# Patient Record
Sex: Female | Born: 1965 | Race: Black or African American | Hispanic: No
Health system: Midwestern US, Community
[De-identification: ages and names within clinical notes are randomized; demographics above are authoritative.]

## PROBLEM LIST (undated history)

## (undated) DIAGNOSIS — N3941 Urge incontinence: Secondary | ICD-10-CM

## (undated) DIAGNOSIS — H8110 Benign paroxysmal vertigo, unspecified ear: Secondary | ICD-10-CM

## (undated) DIAGNOSIS — M5412 Radiculopathy, cervical region: Secondary | ICD-10-CM

## (undated) DIAGNOSIS — J45909 Unspecified asthma, uncomplicated: Secondary | ICD-10-CM

## (undated) DIAGNOSIS — F419 Anxiety disorder, unspecified: Secondary | ICD-10-CM

## (undated) DIAGNOSIS — G4733 Obstructive sleep apnea (adult) (pediatric): Secondary | ICD-10-CM

## (undated) DIAGNOSIS — Z720 Tobacco use: Secondary | ICD-10-CM

## (undated) DIAGNOSIS — I10 Essential (primary) hypertension: Secondary | ICD-10-CM

## (undated) DIAGNOSIS — F1910 Other psychoactive substance abuse, uncomplicated: Secondary | ICD-10-CM

## (undated) DIAGNOSIS — S76011A Strain of muscle, fascia and tendon of right hip, initial encounter: Secondary | ICD-10-CM

## (undated) DIAGNOSIS — T7840XA Allergy, unspecified, initial encounter: Secondary | ICD-10-CM

## (undated) DIAGNOSIS — R519 Headache, unspecified: Secondary | ICD-10-CM

## (undated) DIAGNOSIS — M797 Fibromyalgia: Secondary | ICD-10-CM

## (undated) DIAGNOSIS — Z6841 Body Mass Index (BMI) 40.0 and over, adult: Secondary | ICD-10-CM

## (undated) DIAGNOSIS — R931 Abnormal findings on diagnostic imaging of heart and coronary circulation: Secondary | ICD-10-CM

## (undated) DIAGNOSIS — E785 Hyperlipidemia, unspecified: Secondary | ICD-10-CM

## (undated) DIAGNOSIS — F329 Major depressive disorder, single episode, unspecified: Secondary | ICD-10-CM

## (undated) DIAGNOSIS — M545 Low back pain: Secondary | ICD-10-CM

## (undated) DIAGNOSIS — F5101 Primary insomnia: Secondary | ICD-10-CM

## (undated) DIAGNOSIS — E119 Type 2 diabetes mellitus without complications: Secondary | ICD-10-CM

## (undated) DIAGNOSIS — M1990 Unspecified osteoarthritis, unspecified site: Secondary | ICD-10-CM

## (undated) DIAGNOSIS — J309 Allergic rhinitis, unspecified: Secondary | ICD-10-CM

## (undated) DIAGNOSIS — K219 Gastro-esophageal reflux disease without esophagitis: Secondary | ICD-10-CM

## (undated) DIAGNOSIS — G4730 Sleep apnea, unspecified: Secondary | ICD-10-CM

## (undated) DIAGNOSIS — R110 Nausea: Secondary | ICD-10-CM

## (undated) HISTORY — DX: Other psychoactive substance abuse, uncomplicated: F19.10

## (undated) HISTORY — DX: Urge incontinence: N39.41

## (undated) HISTORY — DX: Tobacco use: Z72.0

## (undated) HISTORY — DX: Abnormal findings on diagnostic imaging of heart and coronary circulation: R93.1

## (undated) HISTORY — DX: Body Mass Index (BMI) 40.0 and over, adult: Z684

## (undated) HISTORY — PX: ECTOPIC PREGNANCY SURGERY: SHX613

## (undated) HISTORY — DX: Benign paroxysmal vertigo, unspecified ear: H81.10

## (undated) HISTORY — DX: Hyperlipidemia, unspecified: E78.5

## (undated) HISTORY — DX: Primary insomnia: F51.01

## (undated) HISTORY — DX: Radiculopathy, cervical region: M54.12

## (undated) HISTORY — DX: Major depressive disorder, single episode, unspecified: F32.9

## (undated) HISTORY — PX: LAPAROTOMY: SHX154

## (undated) HISTORY — DX: Morbid (severe) obesity due to excess calories: E66.01

## (undated) HISTORY — DX: Fibromyalgia: M79.7

## (undated) HISTORY — DX: Obstructive sleep apnea (adult) (pediatric): G47.33

## (undated) HISTORY — DX: Low back pain: M54.5

## (undated) HISTORY — DX: Allergy, unspecified, initial encounter: T78.40XA

## (undated) HISTORY — DX: Anxiety disorder, unspecified: F41.9

## (undated) HISTORY — DX: Strain of muscle, fascia and tendon of right hip, initial encounter: S76.011A

## (undated) HISTORY — DX: Allergic rhinitis, unspecified: J30.9

## (undated) HISTORY — DX: Unspecified osteoarthritis, unspecified site: M19.90

## (undated) HISTORY — DX: Type 2 diabetes mellitus without complications: E11.9

## (undated) HISTORY — DX: Sleep apnea, unspecified: G47.30

## (undated) HISTORY — PX: TUBAL LIGATION: SHX77

## (undated) HISTORY — PX: DILATION AND CURETTAGE OF UTERUS: SHX78

## (undated) HISTORY — DX: Headache, unspecified: R51.9

## (undated) HISTORY — DX: Gastro-esophageal reflux disease without esophagitis: K21.9

## (undated) HISTORY — DX: Nausea: R11.0

---

## 1999-09-08 ENCOUNTER — Other Ambulatory Visit: Admission: RE | Admit: 1999-09-08 | Discharge: 1999-09-08 | Payer: Self-pay | Admitting: Family Medicine

## 2000-08-25 ENCOUNTER — Encounter: Payer: Self-pay | Admitting: Emergency Medicine

## 2000-08-25 ENCOUNTER — Emergency Department (HOSPITAL_COMMUNITY): Admission: EM | Admit: 2000-08-25 | Discharge: 2000-08-25 | Payer: Self-pay | Admitting: Emergency Medicine

## 2000-12-29 ENCOUNTER — Emergency Department (HOSPITAL_COMMUNITY): Admission: EM | Admit: 2000-12-29 | Discharge: 2000-12-29 | Payer: Self-pay | Admitting: Emergency Medicine

## 2001-02-05 ENCOUNTER — Emergency Department (HOSPITAL_COMMUNITY): Admission: EM | Admit: 2001-02-05 | Discharge: 2001-02-05 | Payer: Self-pay | Admitting: Emergency Medicine

## 2001-03-05 ENCOUNTER — Emergency Department (HOSPITAL_COMMUNITY): Admission: EM | Admit: 2001-03-05 | Discharge: 2001-03-05 | Payer: Self-pay | Admitting: Emergency Medicine

## 2001-03-14 ENCOUNTER — Emergency Department (HOSPITAL_COMMUNITY): Admission: EM | Admit: 2001-03-14 | Discharge: 2001-03-14 | Payer: Self-pay | Admitting: Emergency Medicine

## 2001-06-27 ENCOUNTER — Encounter: Payer: Self-pay | Admitting: Family Medicine

## 2001-06-27 ENCOUNTER — Encounter: Admission: RE | Admit: 2001-06-27 | Discharge: 2001-06-27 | Payer: Self-pay | Admitting: Family Medicine

## 2005-05-31 ENCOUNTER — Emergency Department (HOSPITAL_COMMUNITY): Admission: EM | Admit: 2005-05-31 | Discharge: 2005-05-31 | Payer: Self-pay | Admitting: Family Medicine

## 2005-09-25 ENCOUNTER — Emergency Department (HOSPITAL_COMMUNITY): Admission: EM | Admit: 2005-09-25 | Discharge: 2005-09-25 | Payer: Self-pay | Admitting: Emergency Medicine

## 2005-09-27 ENCOUNTER — Emergency Department (HOSPITAL_COMMUNITY): Admission: EM | Admit: 2005-09-27 | Discharge: 2005-09-27 | Payer: Self-pay | Admitting: Emergency Medicine

## 2005-10-14 ENCOUNTER — Emergency Department (HOSPITAL_COMMUNITY): Admission: EM | Admit: 2005-10-14 | Discharge: 2005-10-14 | Payer: Self-pay | Admitting: Family Medicine

## 2006-01-05 ENCOUNTER — Ambulatory Visit: Payer: Self-pay | Admitting: Internal Medicine

## 2006-01-19 ENCOUNTER — Ambulatory Visit: Payer: Self-pay | Admitting: Internal Medicine

## 2006-03-01 ENCOUNTER — Ambulatory Visit: Payer: Self-pay | Admitting: Internal Medicine

## 2006-03-22 ENCOUNTER — Emergency Department (HOSPITAL_COMMUNITY): Admission: EM | Admit: 2006-03-22 | Discharge: 2006-03-22 | Payer: Self-pay | Admitting: Emergency Medicine

## 2006-06-01 ENCOUNTER — Ambulatory Visit: Payer: Self-pay | Admitting: Internal Medicine

## 2006-11-09 ENCOUNTER — Ambulatory Visit: Payer: Self-pay | Admitting: Internal Medicine

## 2006-11-12 ENCOUNTER — Encounter (INDEPENDENT_AMBULATORY_CARE_PROVIDER_SITE_OTHER): Payer: Self-pay | Admitting: *Deleted

## 2006-11-12 ENCOUNTER — Ambulatory Visit: Payer: Self-pay | Admitting: Internal Medicine

## 2006-11-12 LAB — CONVERTED CEMR LAB
ALT: 10 units/L (ref 0–35)
AST: 12 units/L (ref 0–37)
Alkaline Phosphatase: 91 units/L (ref 39–117)
BUN: 15 mg/dL (ref 6–23)
Basophils Absolute: 0 10*3/uL (ref 0.0–0.1)
Basophils Relative: 0 % (ref 0–1)
Cholesterol: 159 mg/dL (ref 0–200)
Creatinine, Ser: 0.97 mg/dL (ref 0.40–1.20)
Glucose, Bld: 119 mg/dL — ABNORMAL HIGH (ref 70–99)
Hemoglobin: 14.3 g/dL (ref 12.0–15.0)
Lymphocytes Relative: 32 % (ref 12–46)
Lymphs Abs: 2.5 10*3/uL (ref 0.7–3.3)
MCV: 92.8 fL (ref 78.0–100.0)
Monocytes Absolute: 0.5 10*3/uL (ref 0.2–0.7)
Neutro Abs: 4.7 10*3/uL (ref 1.7–7.7)
Potassium: 4.2 meq/L (ref 3.5–5.3)
RDW: 14.9 % — ABNORMAL HIGH (ref 11.5–14.0)
Sodium: 139 meq/L (ref 135–145)
Total CHOL/HDL Ratio: 3.8
Total Protein: 7.2 g/dL (ref 6.0–8.3)
WBC: 7.8 10*3/uL (ref 4.0–10.5)

## 2006-11-19 ENCOUNTER — Ambulatory Visit (HOSPITAL_COMMUNITY): Admission: RE | Admit: 2006-11-19 | Discharge: 2006-11-19 | Payer: Self-pay | Admitting: *Deleted

## 2006-11-23 DIAGNOSIS — R5383 Other fatigue: Secondary | ICD-10-CM

## 2006-11-23 DIAGNOSIS — N920 Excessive and frequent menstruation with regular cycle: Secondary | ICD-10-CM | POA: Insufficient documentation

## 2006-11-23 DIAGNOSIS — R5381 Other malaise: Secondary | ICD-10-CM | POA: Insufficient documentation

## 2006-11-23 DIAGNOSIS — I10 Essential (primary) hypertension: Secondary | ICD-10-CM | POA: Insufficient documentation

## 2006-12-19 ENCOUNTER — Ambulatory Visit (HOSPITAL_BASED_OUTPATIENT_CLINIC_OR_DEPARTMENT_OTHER): Admission: RE | Admit: 2006-12-19 | Discharge: 2006-12-19 | Payer: Self-pay | Admitting: *Deleted

## 2006-12-23 ENCOUNTER — Ambulatory Visit: Payer: Self-pay | Admitting: Internal Medicine

## 2007-01-01 DIAGNOSIS — G4733 Obstructive sleep apnea (adult) (pediatric): Secondary | ICD-10-CM | POA: Insufficient documentation

## 2007-01-28 ENCOUNTER — Telehealth (INDEPENDENT_AMBULATORY_CARE_PROVIDER_SITE_OTHER): Payer: Self-pay | Admitting: *Deleted

## 2007-03-05 ENCOUNTER — Ambulatory Visit: Payer: Self-pay | Admitting: Hospitalist

## 2007-03-05 ENCOUNTER — Encounter (INDEPENDENT_AMBULATORY_CARE_PROVIDER_SITE_OTHER): Payer: Self-pay | Admitting: *Deleted

## 2007-03-05 DIAGNOSIS — O99810 Abnormal glucose complicating pregnancy: Secondary | ICD-10-CM | POA: Insufficient documentation

## 2007-03-05 LAB — CONVERTED CEMR LAB
Bilirubin Urine: NEGATIVE
Hemoglobin, Urine: NEGATIVE
Ketones, ur: NEGATIVE mg/dL
Leukocytes, UA: NEGATIVE
Microalb Creat Ratio: 4.1 mg/g (ref 0.0–30.0)
Nitrite: NEGATIVE
Specific Gravity, Urine: 1.019 (ref 1.005–1.03)
TSH: 0.472 microintl units/mL (ref 0.350–5.50)
Urine Glucose: NEGATIVE mg/dL
pH: 6.5 (ref 5.0–8.0)

## 2007-03-20 ENCOUNTER — Ambulatory Visit: Payer: Self-pay | Admitting: Internal Medicine

## 2007-03-20 DIAGNOSIS — L909 Atrophic disorder of skin, unspecified: Secondary | ICD-10-CM | POA: Insufficient documentation

## 2007-03-20 DIAGNOSIS — D239 Other benign neoplasm of skin, unspecified: Secondary | ICD-10-CM | POA: Insufficient documentation

## 2007-03-20 DIAGNOSIS — L919 Hypertrophic disorder of the skin, unspecified: Secondary | ICD-10-CM

## 2007-04-01 ENCOUNTER — Telehealth: Payer: Self-pay | Admitting: *Deleted

## 2007-09-25 ENCOUNTER — Telehealth: Payer: Self-pay | Admitting: *Deleted

## 2007-09-25 ENCOUNTER — Emergency Department (HOSPITAL_COMMUNITY): Admission: EM | Admit: 2007-09-25 | Discharge: 2007-09-25 | Payer: Self-pay | Admitting: Family Medicine

## 2008-01-13 ENCOUNTER — Telehealth (INDEPENDENT_AMBULATORY_CARE_PROVIDER_SITE_OTHER): Payer: Self-pay | Admitting: *Deleted

## 2008-03-12 ENCOUNTER — Emergency Department (HOSPITAL_COMMUNITY): Admission: EM | Admit: 2008-03-12 | Discharge: 2008-03-12 | Payer: Self-pay | Admitting: Family Medicine

## 2008-05-20 ENCOUNTER — Emergency Department (HOSPITAL_COMMUNITY): Admission: EM | Admit: 2008-05-20 | Discharge: 2008-05-20 | Payer: Self-pay | Admitting: Emergency Medicine

## 2008-07-04 ENCOUNTER — Emergency Department (HOSPITAL_COMMUNITY): Admission: EM | Admit: 2008-07-04 | Discharge: 2008-07-04 | Payer: Self-pay | Admitting: Family Medicine

## 2008-07-06 ENCOUNTER — Telehealth: Payer: Self-pay | Admitting: *Deleted

## 2008-07-22 ENCOUNTER — Telehealth: Payer: Self-pay | Admitting: *Deleted

## 2008-07-23 ENCOUNTER — Telehealth (INDEPENDENT_AMBULATORY_CARE_PROVIDER_SITE_OTHER): Payer: Self-pay | Admitting: *Deleted

## 2008-07-28 ENCOUNTER — Ambulatory Visit: Payer: Self-pay | Admitting: Internal Medicine

## 2008-07-28 ENCOUNTER — Encounter (INDEPENDENT_AMBULATORY_CARE_PROVIDER_SITE_OTHER): Payer: Self-pay | Admitting: *Deleted

## 2008-07-28 LAB — CONVERTED CEMR LAB
CO2: 27 meq/L (ref 19–32)
Glucose, Bld: 135 mg/dL — ABNORMAL HIGH (ref 70–99)
Potassium: 4.3 meq/L (ref 3.5–5.3)
Sodium: 138 meq/L (ref 135–145)

## 2008-08-13 ENCOUNTER — Emergency Department (HOSPITAL_COMMUNITY): Admission: EM | Admit: 2008-08-13 | Discharge: 2008-08-14 | Payer: Self-pay | Admitting: Emergency Medicine

## 2008-08-13 DIAGNOSIS — J1189 Influenza due to unidentified influenza virus with other manifestations: Secondary | ICD-10-CM | POA: Insufficient documentation

## 2008-08-13 DIAGNOSIS — J209 Acute bronchitis, unspecified: Secondary | ICD-10-CM | POA: Insufficient documentation

## 2008-08-21 ENCOUNTER — Encounter (INDEPENDENT_AMBULATORY_CARE_PROVIDER_SITE_OTHER): Payer: Self-pay | Admitting: *Deleted

## 2008-08-21 ENCOUNTER — Ambulatory Visit: Payer: Self-pay | Admitting: Internal Medicine

## 2008-10-20 ENCOUNTER — Telehealth (INDEPENDENT_AMBULATORY_CARE_PROVIDER_SITE_OTHER): Payer: Self-pay | Admitting: *Deleted

## 2009-05-20 ENCOUNTER — Emergency Department (HOSPITAL_COMMUNITY): Admission: EM | Admit: 2009-05-20 | Discharge: 2009-05-20 | Payer: Self-pay | Admitting: Emergency Medicine

## 2009-06-04 ENCOUNTER — Telehealth: Payer: Self-pay | Admitting: *Deleted

## 2009-06-05 ENCOUNTER — Emergency Department (HOSPITAL_COMMUNITY): Admission: EM | Admit: 2009-06-05 | Discharge: 2009-06-05 | Payer: Self-pay | Admitting: Emergency Medicine

## 2009-06-07 ENCOUNTER — Encounter: Payer: Self-pay | Admitting: Internal Medicine

## 2009-06-07 ENCOUNTER — Ambulatory Visit: Payer: Self-pay | Admitting: Infectious Diseases

## 2009-06-07 DIAGNOSIS — K219 Gastro-esophageal reflux disease without esophagitis: Secondary | ICD-10-CM | POA: Insufficient documentation

## 2009-06-07 LAB — CONVERTED CEMR LAB
ALT: 13 U/L (ref 0–35)
AST: 12 U/L (ref 0–37)
Albumin: 4.2 g/dL (ref 3.5–5.2)
Alkaline Phosphatase: 98 U/L (ref 39–117)
BUN: 18 mg/dL (ref 6–23)
CO2: 24 meq/L (ref 19–32)
Calcium: 9.8 mg/dL (ref 8.4–10.5)
Chloride: 106 meq/L (ref 96–112)
Cholesterol: 167 mg/dL (ref 0–200)
Creatinine, Ser: 1.05 mg/dL (ref 0.40–1.20)
Glucose, Bld: 98 mg/dL (ref 70–99)
HDL: 58 mg/dL (ref >39)
LDL Cholesterol: 84 mg/dL (ref 0–99)
Potassium: 5 meq/L (ref 3.5–5.3)
Sodium: 145 meq/L (ref 135–145)
Total Bilirubin: 0.2 mg/dL — ABNORMAL LOW (ref 0.3–1.2)
Total CHOL/HDL Ratio: 2.9
Total Protein: 7 g/dL (ref 6.0–8.3)
Triglycerides: 126 mg/dL (ref <150)
VLDL: 25 mg/dL (ref 0–40)

## 2009-06-10 ENCOUNTER — Ambulatory Visit (HOSPITAL_COMMUNITY): Admission: RE | Admit: 2009-06-10 | Discharge: 2009-06-10 | Payer: Self-pay | Admitting: Internal Medicine

## 2009-06-24 ENCOUNTER — Telehealth: Payer: Self-pay | Admitting: Internal Medicine

## 2009-08-11 ENCOUNTER — Ambulatory Visit: Payer: Self-pay | Admitting: Advanced Practice Midwife

## 2009-08-11 ENCOUNTER — Inpatient Hospital Stay (HOSPITAL_COMMUNITY): Admission: AD | Admit: 2009-08-11 | Discharge: 2009-08-11 | Payer: Self-pay | Admitting: Obstetrics & Gynecology

## 2009-09-06 ENCOUNTER — Emergency Department (HOSPITAL_COMMUNITY): Admission: EM | Admit: 2009-09-06 | Discharge: 2009-09-06 | Payer: Self-pay | Admitting: Family Medicine

## 2010-02-03 ENCOUNTER — Ambulatory Visit: Payer: Self-pay | Admitting: Internal Medicine

## 2010-02-04 LAB — CONVERTED CEMR LAB
Albumin: 4.2 g/dL (ref 3.5–5.2)
Alkaline Phosphatase: 91 units/L (ref 39–117)
CO2: 28 meq/L (ref 19–32)
Sodium: 141 meq/L (ref 135–145)
Total Protein: 7.2 g/dL (ref 6.0–8.3)

## 2010-02-27 ENCOUNTER — Ambulatory Visit: Payer: Self-pay | Admitting: Infectious Diseases

## 2010-02-27 ENCOUNTER — Observation Stay (HOSPITAL_COMMUNITY): Admission: EM | Admit: 2010-02-27 | Discharge: 2010-03-02 | Payer: Self-pay | Admitting: Emergency Medicine

## 2010-02-27 ENCOUNTER — Emergency Department (HOSPITAL_COMMUNITY): Admission: EM | Admit: 2010-02-27 | Discharge: 2010-02-27 | Payer: Self-pay | Admitting: Family Medicine

## 2010-02-28 ENCOUNTER — Encounter (INDEPENDENT_AMBULATORY_CARE_PROVIDER_SITE_OTHER): Payer: Self-pay | Admitting: Internal Medicine

## 2010-03-01 ENCOUNTER — Encounter: Payer: Self-pay | Admitting: Internal Medicine

## 2010-03-07 ENCOUNTER — Telehealth: Payer: Self-pay | Admitting: *Deleted

## 2010-03-09 ENCOUNTER — Ambulatory Visit (HOSPITAL_COMMUNITY): Admission: RE | Admit: 2010-03-09 | Discharge: 2010-03-09 | Payer: Self-pay | Admitting: Internal Medicine

## 2010-03-09 ENCOUNTER — Ambulatory Visit: Payer: Self-pay | Admitting: Internal Medicine

## 2010-03-09 DIAGNOSIS — J45909 Unspecified asthma, uncomplicated: Secondary | ICD-10-CM | POA: Insufficient documentation

## 2010-03-09 DIAGNOSIS — M25519 Pain in unspecified shoulder: Secondary | ICD-10-CM | POA: Insufficient documentation

## 2010-03-10 ENCOUNTER — Encounter: Payer: Self-pay | Admitting: Internal Medicine

## 2010-03-14 ENCOUNTER — Ambulatory Visit (HOSPITAL_COMMUNITY): Admission: RE | Admit: 2010-03-14 | Discharge: 2010-03-14 | Payer: Self-pay | Admitting: Internal Medicine

## 2010-03-15 ENCOUNTER — Telehealth: Payer: Self-pay | Admitting: *Deleted

## 2010-03-21 ENCOUNTER — Ambulatory Visit: Payer: Self-pay | Admitting: Internal Medicine

## 2010-03-21 ENCOUNTER — Encounter: Payer: Self-pay | Admitting: Internal Medicine

## 2010-03-22 ENCOUNTER — Telehealth: Payer: Self-pay | Admitting: *Deleted

## 2010-03-31 ENCOUNTER — Telehealth: Payer: Self-pay | Admitting: Internal Medicine

## 2010-04-05 ENCOUNTER — Encounter: Payer: Self-pay | Admitting: Internal Medicine

## 2010-04-21 ENCOUNTER — Telehealth (INDEPENDENT_AMBULATORY_CARE_PROVIDER_SITE_OTHER): Payer: Self-pay | Admitting: *Deleted

## 2010-04-21 ENCOUNTER — Encounter: Payer: Self-pay | Admitting: Internal Medicine

## 2010-04-26 ENCOUNTER — Telehealth (INDEPENDENT_AMBULATORY_CARE_PROVIDER_SITE_OTHER): Payer: Self-pay | Admitting: Internal Medicine

## 2010-04-26 ENCOUNTER — Ambulatory Visit: Payer: Self-pay | Admitting: Internal Medicine

## 2010-04-26 DIAGNOSIS — R079 Chest pain, unspecified: Secondary | ICD-10-CM | POA: Insufficient documentation

## 2010-04-26 LAB — CONVERTED CEMR LAB
Chloride: 101 meq/L (ref 96–112)
Cholesterol: 181 mg/dL (ref 0–200)
Glucose, Bld: 98 mg/dL (ref 70–99)
HDL: 64 mg/dL (ref >39)
Hgb A1c MFr Bld: 6.3 %
Potassium: 4.4 meq/L (ref 3.5–5.3)
Sodium: 142 meq/L (ref 135–145)
Total CHOL/HDL Ratio: 2.8
Triglycerides: 76 mg/dL (ref <150)
VLDL: 15 mg/dL (ref 0–40)

## 2010-05-04 ENCOUNTER — Encounter: Payer: Self-pay | Admitting: Internal Medicine

## 2010-05-05 ENCOUNTER — Encounter: Payer: Self-pay | Admitting: Internal Medicine

## 2010-08-15 ENCOUNTER — Telehealth: Payer: Self-pay | Admitting: *Deleted

## 2010-09-11 ENCOUNTER — Emergency Department (HOSPITAL_COMMUNITY): Admission: EM | Admit: 2010-09-11 | Discharge: 2010-09-11 | Payer: Self-pay | Admitting: Family Medicine

## 2010-09-21 ENCOUNTER — Ambulatory Visit: Payer: Self-pay | Admitting: Internal Medicine

## 2010-09-21 DIAGNOSIS — F4321 Adjustment disorder with depressed mood: Secondary | ICD-10-CM | POA: Insufficient documentation

## 2010-11-24 ENCOUNTER — Ambulatory Visit: Admit: 2010-11-24 | Payer: Self-pay

## 2010-12-22 NOTE — Letter (Signed)
Summary: [facility] [facility]  [facility]   Imported By: Margie Billet 04/27/2010 11:08:43  _____________________________________________________________________  External Attachment:    Type:   Image     Comment:   External Document

## 2010-12-22 NOTE — Progress Notes (Signed)
Summary: phone/gg  Phone Note Call from Patient   Caller: Patient Summary of Call: Pt called with c/o pain from right side of chest  through to back.  Onset yesterday, constant throb.   Pain is better now put gets sharp at times, esp at night.   Denies SOB , diaphoresis, nausea.   No known cause of pain.  took aleve without relief.  also took meds for gas that has not helped. appointment given for 3:30 today Pt #  [phone] Initial call taken by: Merrie Roof RN,  April 21, 2010 10:30 AM  Follow-up for Phone Call        Difficult to evaluate severity and nature of CP.  No CAD nor PE on problem list.  Would recommend ER sice having active CP.  If pt refuses, keep todays OPC appt.  Follow-up by: Blanch Media MD,  April 21, 2010 10:43 AM  Additional Follow-up for Phone Call Additional follow up Details #1::        Pt informed and will go to ED Additional Follow-up by: Merrie Roof RN,  April 21, 2010 10:56 AM

## 2010-12-22 NOTE — Consult Note (Signed)
Summary: [facility]  [facility]   Imported By: Margie Billet 05/19/2010 10:02:43  _____________________________________________________________________  External Attachment:    Type:   Image     Comment:   External Document

## 2010-12-22 NOTE — Miscellaneous (Signed)
Summary: [facility]  [facility]   Imported By: Gentry Fitz 05/17/2010 14:12:08  _____________________________________________________________________  External Attachment:    Type:   Image     Comment:   External Document  Appended Document: [facility]    Clinical Lists Changes  Observations: Added new observation of PAST MED HX: Headache, daily Hypertension Menorrhagia, normal ultrasound 11-19-06 Fatigue Obstructive sleep apnea      On CPAP (going back to get O2 titrated) Hx mass left breast, repeat mammogram and ultrasound negative Hx right rotator cuff tendinitis july 2007 Prediabetes (Fasting glucose 119 03/05/07)  No pap smear on record  (05/31/2010 10:15)       Impression & Recommendations:  Problem # 1:  Preventive Health Care (ICD-V70.0) No pap smear per GYN records  Complete Medication List: 1)  Lisinopril 30 Mg Tabs (Lisinopril) .... Take 1 tablet by mouth once a day 2)  Hydrochlorothiazide 25 Mg Tabs (Hydrochlorothiazide) .... Take 1 tablet by mouth once a day 3)  Ventolin Hfa 108 (90 Base) Mcg/act Aers (Albuterol sulfate) .... Inhale 2 puffs every 4-6 hours as needed for wheezing. 4)  Flovent Diskus 50 Mcg/blist Aepb (Fluticasone propionate (inhal)) .Marland Kitchen.. 1 puff two times a day 5)  Lorazepam 0.5 Mg Tabs (Lorazepam) .Marland Kitchen.. 1 tab as needed for anxiety.   Past History:  Past Medical History: Headache, daily Hypertension Menorrhagia, normal ultrasound 11-19-06 Fatigue Obstructive sleep apnea      On CPAP (going back to get O2 titrated) Hx mass left breast, repeat mammogram and ultrasound negative Hx right rotator cuff tendinitis july 2007 Prediabetes (Fasting glucose 119 03/05/07)  No pap smear on record

## 2010-12-22 NOTE — Letter (Signed)
Summary: Generic Letter  [facility]  [address]   [place], [place] [address]   Phone: [phone]  Fax: [phone]    03/21/2010  CAILYNN BODNAR [address] [place], [place]  [address]  Reg: Ms. NIGG,  Ms. Gamm was here in our [facility] clinic today. If you have further questions, please feel free to call us.       Sincerely,   Blondell Reveal MD

## 2010-12-22 NOTE — Initial Assessments (Signed)
Summary: H&P  INTERNAL MEDICINE TEACHING SERVICE ADMISSION HISTORY & PHYSICAL   Attending: Dr. Tilford Pillar First contact: Dr. Scot Dock [phone] Second contact: Dr. Comer Locket [phone] (Weekends, after-hours: [phone], [phone])     PCP: Dr. Sunnie Nielsen     CC: SOB   HPI: 45 yo woman with HTN, OSA, and occasional asthma not on regular inhalers, presented to the ER with one day history of sudden onset breathing difficulty. Started in the afternoon time one day ago, she started to feel short of breath and had dry cough. This remained persistent and worsened throughout the night and she had to come in for treatment. She denies chest pain, apart from the pains she gets with coughing. She denies any leg swelling, immobility, hemoptysis or h/o dvt. She denies orthopnea, PND, fever, chills, sick contact, travel.   She doesn't recall anything that could have triggered it. She continues to smoke 1-2 cigarette daily. Has no pets at home, but recently got a couch from her neighbour who owns a dog. The floor in the house is carpeted and is "clean".   She initially went to the urgent care center, where she got breathing treatment, but her sats remained low in the 80s and hence was sent to the ER.   Allergies:  NKDA  Past Medical History:  Migraine Hypertension Menorrhagia, normal ultrasound 11-19-06 Fatigue Obstructive sleep apnea      On CPAP (going back to get O2 titrated) Hx mass left breast, repeat mammogram and ultrasound negative Hx right rotator cuff tendinitis july 2007 Prediabetes (Fasting glucose 119 03/05/07)  Past Surgical History:  None  Current Meds:  LISINOPRIL 30 MG TABS (LISINOPRIL) Take 1 tablet by mouth once a day HYDROCHLOROTHIAZIDE 25 MG TABS (HYDROCHLOROTHIAZIDE) Take 1 tablet by mouth once a day VENTOLIN HFA 108 (90 BASE) MCG/ACT AERS (ALBUTEROL SULFATE) inhale 2 puffs every 4-6 hours as needed for wheezing.    Social History:   Has a 10 yr old son, a Research scientist (medical) and a granddgtr  that live with her. Sings in church, works at [employer].   Smokes one cigarette a day  Family History:  Non-contributory  Review of System: Negative except per HPI.    Vital Signs: BP-169/118    HR-122   RR-27   Temp-99.3   Sat-98%/3L  Physical Exam: GEN- Not in distress, able to speak in sentences HEENT- NCAT, PERRL, no icterus/pallor LUNGS- bilat good air entry, prolonged expiration, + diffuse wheezes   CVS- regular tachy, no MRG ABD- soft, non-tender, normal bowel sounds.   EXT- no cyanosis, clubbing or edema.  NEURO- no focal deficit. PSY- appropriate.    LAB RESULTS       15.6 >-----------<              46  138        101       7 ------------------------------<165     3.8        28         0.9   Calcium 1.03, ionized.         CXR: Low lung volume w/ vascular crowding, streaky bibasilar atx.    ASSESSMENT & PLAN  This is 45 yo woman with h/o intermittent asthma presenting with asthma exac. She will be admitted for breathing treatment and IV steroids. She will need to be started on regular inhaled corticosteroid treatment.   - admit to regular floor - duoneb q 4h - solumedrol iv - continue bp  meds - smoking cessation consult  prophylaxis: lovenox  ATTENDING PHYSICIAN: I performed and/or observed a history and physical examination of the patient.  I discussed the case with the residents as noted and reviewed the residents' notes.  I agree with the findings and plan--please refer to the attending physician note for more details.  Signature________________________________  Printed Name_____________________________

## 2010-12-22 NOTE — Assessment & Plan Note (Signed)
Summary: ACUTE-URGENT CARE 1 WEEK MEDICATION F/U/CFB(MAGICK)   Vital Signs:  Patient profile:   45 year old female Height:      62.5 inches (158.75 cm) Weight:      221.0 pounds (100.45 kg) BMI:     39.92 Temp:     97.6 degrees F (36.44 degrees C) oral Pulse rate:   92 / minute BP sitting:   146 / 106  (left arm) Cuff size:   regular  Vitals Entered By: Theotis Barrio NT II (September 21, 2010 8:43 AM) CC: BP IS UP / PATIENT DID NOT TAKE BP MED THIS MORNING / ? MEDICATION REFILL/ STOPPED TAKING THE CYMBALTA Is Patient Diabetic? No Pain Assessment Patient in pain? yes     Location: ARMS.- LEGS- FEET Intensity:     3 Type: dull Onset of pain  FOR ABOUT 2 MONTHS / ORIG. SEEN AT URGENT CARE Nutritional Status BMI of > 30 = obese  Have you ever been in a relationship where you felt threatened, hurt or afraid?No   Does patient need assistance? Functional Status Self care Ambulation Normal   Primary Care Provider:  Hartley Barefoot MD  CC:  BP IS UP / PATIENT DID NOT TAKE BP MED THIS MORNING / ? MEDICATION REFILL/ STOPPED TAKING THE CYMBALTA.  History of Present Illness: This is a 45 year old female with a past medical history significant for hypertension, asthma and OSA who presents for an acute visit for diffuse body pains over the last several months.  Pt has tried taking xanax and 800mg  IBU with no improvement.  Previously job was very stressful and she stopped working.  Of note the patient started a new job last week.  Stress level is now greatly improved.  Diffuse body pains have continued. Experiences sharp pain in fingers, shoulders with stiffness in legs and pain with walking. Pain is not worse at any particular time of day.  Pt is not sleeping well, and waking up 3 times a night.  Pt went to urgent care and Dr. there gave cymbalta last week which has not helped and told it was adjustment disorder.  Cymbalta is making sleep more difficult but is helping with some with pain.  Pt  had depressive state a few months ago. Financial difficulties, son misbehaving ect.  Situation improving now.  Cymbalta is also too expensive (180$ monthly).  Asthma is currently fine, not requiring inhalers and has had no cough.  Pt's BP was elevated today but she had not taken her bp in last two days.  Recommended pill box.  Pt denies any cp, sob, change in bm's or blood in stool.      Preventive Screening-Counseling & Management  Alcohol-Tobacco     Alcohol drinks/day: social     Smoking Status: quit     Smoking Cessation Counseling: yes     Packs/Day: 1-2 cigs a day     Year Started: stopped last Thursday     Passive Smoke Exposure: yes  Caffeine-Diet-Exercise     Diet Comments: NSTRF4     Does Patient Exercise: no  Problems Prior to Update: 1)  Chest Pain  (ICD-786.50) 2)  Glucose Tlrnc Abnrm, Mthr Cmplg Prg, Unspc  (ICD-648.80) 3)  Hypertension  (ICD-401.9) 4)  Asthma  (ICD-493.90) 5)  Esophageal Reflux  (ICD-530.81) 6)  Sleep Apnea, Obstructive, Moderate  (ICD-327.23) 7)  Headache  (ICD-784.0) 8)  Shoulder Pain, Bilateral  (ICD-719.41) 9)  Screening For Lipoid Disorders  (ICD-V77.91) 10)  Influenza  (  ICD-487.8) 11)  Acute Bronchitis  (ICD-466.0) 12)  Nevus  (ICD-216.9) 13)  Skin Tag  (ICD-701.9) 14)  Fatigue, Chronic  (ICD-780.79) 15)  Menorrhagia  (ICD-626.2)  Allergies: No Known Drug Allergies  Family History: Reviewed history and no changes required. Mother and sisters have insomnia.   Social History: Reviewed history from 08/21/2008 and no changes required. Has a 88 yr old son, a dgtr and a granddgtr that live with her. Sings in church. Pt started new job 09/14/10.  Review of Systems       Negative as per HPI.   Physical Exam  General:  alert, well-developed, and well-hydrated.   Head:  normocephalic and atraumatic.   Eyes:  vision grossly intact, pupils equal, pupils round, and pupils reactive to light.   Ears:  R ear normal and L ear normal.     Nose:  no external deformity and no nasal discharge.   Neck:  supple.   Lungs:  normal respiratory effort, normal breath sounds, no crackles.  There were diffuse  expiratory wheezes.  Heart:  normal rate, regular rhythm, no murmur, no gallop, and no rub.   Abdomen:  soft, non-tender, and normal bowel sounds.   Msk:  normal ROM, no joint tenderness, no joint swelling, no joint warmth, and no redness over joints.   Pulses:  2+ PT/DP pulses bilaterally.  Neurologic:  cranial nerves II-XII intact.   Skin:  No rashes   Impression & Recommendations:  Problem # 1:  DEPRESSION, SITUATIONAL (ICD-309.0) Pt depression is improving and was discribed as situational.  Unfortunately, cymbalta is too expensive for the patient to continue even though it has helped her diffuse pains to some degree.  Given the patients diffuse pain symptoms and difficulty sleeping, both of which are likely secondary to the patient's depression, I will start her on low dose amitriptyline today.   The patient was instructed to take the medication at night as it should help her to sleep.  Problem # 2:  ASTHMA (ICD-493.90) Pt denies any SOB, cough or wheezing and felt that since she was asymptomatic that she could stop all of her inhalers.  On exam there were diffuse wheezes, and patient was told that she needed to restart her flovent which she agreed to do.    Restart flovent Her updated medication list for this problem includes:    Ventolin Hfa 108 (90 Base) Mcg/act Aers (Albuterol sulfate) ..... Inhale 2 puffs every 4-6 hours as needed for wheezing.    Flovent Diskus 50 Mcg/blist Aepb (Fluticasone propionate (inhal)) .Marland Kitchen... 1 puff two times a day  Problem # 3:  HYPERTENSION (ICD-401.9) Pt's BP was slightly elevated today which she attributes to not taking her BP medicains in the the last several days.  The patient states that she did not take them because she did not have them when she needed them.  I recommended that she put  all her pills for the week in a pill box that she could carry in her purse to improve adherence. Her updated medication list for this problem includes:    Lisinopril 30 Mg Tabs (Lisinopril) .Marland Kitchen... Take 1 tablet by mouth once a day    Hydrochlorothiazide 25 Mg Tabs (Hydrochlorothiazide) .Marland Kitchen... Take 1 tablet by mouth once a day  Complete Medication List: 1)  Lisinopril 30 Mg Tabs (Lisinopril) .... Take 1 tablet by mouth once a day 2)  Hydrochlorothiazide 25 Mg Tabs (Hydrochlorothiazide) .... Take 1 tablet by mouth once a day 3)  Ventolin Hfa  108 (90 Base) Mcg/act Aers (Albuterol sulfate) .... Inhale 2 puffs every 4-6 hours as needed for wheezing. 4)  Flovent Diskus 50 Mcg/blist Aepb (Fluticasone propionate (inhal)) .Marland Kitchen.. 1 puff two times a day 5)  Lorazepam 0.5 Mg Tabs (Lorazepam) .Marland Kitchen.. 1 tab as needed for anxiety. 6)  Amitriptyline Hcl 25 Mg Tabs (Amitriptyline hcl) .... Take 1 tab by mouth at at around 6-7 pm.   Patient Instructions: 1)  You will start your amitriptiyliine for your depression and difficulty sleeping.  Please take around 6-7 pm and avoid driving heavy machinery until you find out how the medication will effect you.  If you note worsening depression please call the clinic or go to the ER.  Please get a pill box to store your medications and take them as perscribed.  Please start taking your flovent daily.  Prescriptions: AMITRIPTYLINE HCL 25 MG TABS (AMITRIPTYLINE HCL) Take 1 tab by mouth at at around 6-7 pm.  #30 x 3   Entered and Authorized by:   Sinda Du MD   Signed by:   Sinda Du MD on 09/21/2010   Method used:   Print then Give to Patient   RxID:   [id number]    Orders Added: 1)  Est. Patient Level III [88416]     Prevention & Chronic Care Immunizations   Influenza vaccine: Not documented   Influenza vaccine deferral: Deferred  (06/07/2009)   Influenza vaccine due: 07/21/2009    Tetanus booster: Not documented   Td booster deferral: Deferred   (06/07/2009)    Pneumococcal vaccine: Not documented   Pneumococcal vaccine deferral: Deferred  (06/07/2009)  Other Screening   Pap smear: Not documented    Mammogram: ASSESSMENT: Negative - BI-RADS 1^MM DIGITAL SCREENING  (06/10/2009)   Mammogram action/deferral: Screening mammogram in 1 year.     (06/10/2009)   Mammogram due: 06/2010   Smoking status: quit  (09/21/2010)  Lipids   Total Cholesterol: 181  (04/26/2010)   Lipid panel action/deferral: Lipid Panel ordered   LDL: 102  (04/26/2010)   LDL Direct: Not documented   HDL: 64  (04/26/2010)   Triglycerides: 76  (04/26/2010)  Hypertension   Last Blood Pressure: 146 / 106  (09/21/2010)   Serum creatinine: 0.87  (04/26/2010)   BMP action: Ordered   Serum potassium 4.4  (04/26/2010)  Self-Management Support :   Personal Goals (by the next clinic visit) :      Personal blood pressure goal: 140/90  (03/09/2010)   Patient will work on the following items until the next clinic visit to reach self-care goals:     Medications and monitoring: take my medicines every day, bring all of my medications to every visit  (09/21/2010)     Eating: drink diet soda or water instead of juice or soda, eat more vegetables, use fresh or frozen vegetables, eat foods that are low in salt, eat baked foods instead of fried foods, eat fruit for snacks and desserts, limit or avoid alcohol  (09/21/2010)     Activity: take a 30 minute walk every day  (09/21/2010)    Hypertension self-management support: Resources for patients handout  (09/21/2010)      Resource handout printed.   Appended Document: ACUTE-URGENT CARE 1 WEEK MEDICATION F/U/CFB(MAGICK) I discussed the patient with Dr. Cathey Endow and I agree with the assessment and plan as outlined above.

## 2010-12-22 NOTE — Progress Notes (Signed)
Summary: lab results/ hla  Phone Note Call from Patient   Summary of Call: pt called at [phone], wanted lab results, i don't see that labs were done, please advise? Initial call taken by: Marin Roberts RN,  April 26, 2010 5:36 PM  Follow-up for Phone Call        I tried to call the patient, she did not pick the phone. We checked for diabetes. Her fasting blood sugar is normal. She doesn't have diabetes. If she calls back you can let her know. I will be happy to talk to her if she wants.  Follow-up by: Zara Council MD,  April 27, 2010 8:03 AM  Additional Follow-up for Phone Call Additional follow up Details #1::        Patient would like to hear from you about labs,phone number is [phone] Additional Follow-up by: [name],  April 27, 2010 3:23 PM    Additional Follow-up for Phone Call Additional follow up Details #2::    called patient, gave info.  Follow-up by: Zara Council MD,  April 28, 2010 10:14 AM

## 2010-12-22 NOTE — Assessment & Plan Note (Signed)
Summary: HFU-PER DR DEVANI(REGALADO)/CFB   Vital Signs:  Patient profile:   45 year old female Height:      62.5 inches (158.75 cm) Weight:      213.7 pounds (97.05 kg) BMI:     38.57 Temp:     98.7 degrees F (37.06 degrees C) oral Pulse rate:   95 / minute BP sitting:   125 / 87  (left arm) Cuff size:   large  Vitals Entered By: Theotis Barrio NT II (March 09, 2010 1:57 PM) CC: HFU - IS HAVING PAIN IN NECK, UPPER BACK/BILATERAL SOU Is Patient Diabetic? No Research Study Name: THIS STARTED ABOUT LAST TUESDAY Pain Assessment Patient in pain? yes     Location: NECK-ARMS Intensity:      3 Type: ACHY /  Onset of pain  TUJ Nutritional Status BMI of > 30 = obese  Have you ever been in a relationship where you felt threatened, hurt or afraid?No   Does patient need assistance? Functional Status Self care Ambulation Normal   Primary Care Provider:  Hartley Barefoot MD  CC:  HFU - IS HAVING PAIN IN NECK and UPPER BACK/BILATERAL SOU.  History of Present Illness: 45 year old with Past Medical History as mentioned below comes to the office for a : HFU .  Patient was admitted to the hospital  for asthma exacerbation. Reports compliancy to her inhalers (both albuterol and steroid inhaler) and reports that her breathing improved a lot.   She is complaining of both shoulder pains for the last several days and is taking naproxen which is helping her a little bit. She denies any trauma, fever or other systemic complaints.   She denies any other complaints.   Preventive Screening-Counseling & Management  Alcohol-Tobacco     Smoking Status: quit     Smoking Cessation Counseling: yes     Packs/Day: 1-2 cigs a day     Year Started: stopped last Thursday     Passive Smoke Exposure: yes  Caffeine-Diet-Exercise     Diet Comments: NSTRF4  Medications Prior to Update: 1)  Lisinopril 30 Mg Tabs (Lisinopril) .... Take 1 Tablet By Mouth Once A Day 2)  Hydrochlorothiazide 25 Mg Tabs  (Hydrochlorothiazide) .... Take 1 Tablet By Mouth Once A Day 3)  Ventolin Hfa 108 (90 Base) Mcg/act Aers (Albuterol Sulfate) .... Inhale 2 Puffs Every 4-6 Hours As Needed For Wheezing. 4)  Flovent Diskus 50 Mcg/blist Aepb (Fluticasone Propionate (Inhal)) .Marland Kitchen.. 1 Puff Two Times A Day 5)  Prednisone 20 Mg Tabs (Prednisone) .... 2 Tab For 3 Days, Then 1 Tab For 3 Days, Then Stop 6)  Lorazepam 0.5 Mg Tabs (Lorazepam) .Marland Kitchen.. 1 Tab As Needed For Anxiety.  Current Medications (verified): 1)  Lisinopril 30 Mg Tabs (Lisinopril) .... Take 1 Tablet By Mouth Once A Day 2)  Hydrochlorothiazide 25 Mg Tabs (Hydrochlorothiazide) .... Take 1 Tablet By Mouth Once A Day 3)  Ventolin Hfa 108 (90 Base) Mcg/act Aers (Albuterol Sulfate) .... Inhale 2 Puffs Every 4-6 Hours As Needed For Wheezing. 4)  Flovent Diskus 50 Mcg/blist Aepb (Fluticasone Propionate (Inhal)) .Marland Kitchen.. 1 Puff Two Times A Day 5)  Lorazepam 0.5 Mg Tabs (Lorazepam) .Marland Kitchen.. 1 Tab As Needed For Anxiety.  Allergies (verified): No Known Drug Allergies  Past History:  Past Medical History: Last updated: 03/20/2007 Headache, daily Hypertension Menorrhagia, normal ultrasound 11-19-06 Fatigue Obstructive sleep apnea      On CPAP (going back to get O2 titrated) Hx mass left breast, repeat mammogram and  ultrasound negative Hx right rotator cuff tendinitis july 2007 Prediabetes (Fasting glucose 119 03/05/07)  Social History: Last updated: 08/21/2008 Has a 73 yr old son, a dgtr and a granddgtr that live with her. Sings in church. Got laid off from work and is looking for a new job.  Risk Factors: Diet: NSTRF4 (03/09/2010) Exercise: no (02/03/2010)  Risk Factors: Smoking Status: quit (03/09/2010) Packs/Day: 1-2 cigs a day (03/09/2010) Passive Smoke Exposure: yes (03/09/2010)  Social History: Reviewed history from 08/21/2008 and no changes required. Has a 50 yr old son, a dgtr and a granddgtr that live with her. Sings in church. Got laid off from  work and is looking for a new job.  Review of Systems      See HPI  Physical Exam  General:  alert, well-developed, and well-nourished.   Head:  normocephalic, atraumatic, and no abnormalities observed.   Lungs:  normal respiratory effort, no intercostal retractions, no accessory muscle use, and normal breath sounds.   Heart:  normal rate and regular rhythm.   Abdomen:  soft, non-tender, normal bowel sounds, no distention, and no masses.   Msk:  No deformity or scoliosis noted of thoracic or lumbar spine.   Pulses:  R radial normal.   Extremities:  trace left pedal edema and trace right pedal edema.   Neurologic:  alert & oriented X3, cranial nerves II-XII intact, strength normal in all extremities, and sensation intact to light touch.     Impression & Recommendations:  Problem # 1:  ASTHMA (ICD-493.90)  Well controlled currently. Recently admitted to [facility] for exac. Given her rare asthma spells, recommended to take use as needed albuterol. Discussed with patient regarding starting inhaled steroids option if asthma spells get more frequent. I would keep thi sinhaler in the list for now. Will check PFT's and follow up.  The following medications were removed from the medication list:    Prednisone 20 Mg Tabs (Prednisone) .Marland Kitchen... 2 tab for 3 days, then 1 tab for 3 days, then stop Her updated medication list for this problem includes:    Ventolin Hfa 108 (90 Base) Mcg/act Aers (Albuterol sulfate) ..... Inhale 2 puffs every 4-6 hours as needed for wheezing.    Flovent Diskus 50 Mcg/blist Aepb (Fluticasone propionate (inhal)) .Marland Kitchen... 1 puff two times a day  Orders: Full Pulmonary Function Test (PFT)  Problem # 2:  SHOULDER PAIN, BILATERAL (ICD-719.41)  Unclear etiology. Will check xrays of both shoulders and will follow up. Patient is currently taking naproxen and appears to be helping her. Will follow up.  Orders: T-DG Shoulder 2V Bilat ([id number])  Problem # 3:  HYPERTENSION  (ICD-401.9) Well controlled. Cont current regimen.  Her updated medication list for this problem includes:    Lisinopril 30 Mg Tabs (Lisinopril) .Marland Kitchen... Take 1 tablet by mouth once a day    Hydrochlorothiazide 25 Mg Tabs (Hydrochlorothiazide) .Marland Kitchen... Take 1 tablet by mouth once a day  BP today: 125/87 Prior BP: 136/87 (02/03/2010)  Labs Reviewed: K+: 4.2 (02/03/2010) Creat: : 0.87 (02/03/2010)   Chol: 167 (06/07/2009)   HDL: 58 (06/07/2009)   LDL: 84 (06/07/2009)   TG: 126 (06/07/2009)  Complete Medication List: 1)  Lisinopril 30 Mg Tabs (Lisinopril) .... Take 1 tablet by mouth once a day 2)  Hydrochlorothiazide 25 Mg Tabs (Hydrochlorothiazide) .... Take 1 tablet by mouth once a day 3)  Ventolin Hfa 108 (90 Base) Mcg/act Aers (Albuterol sulfate) .... Inhale 2 puffs every 4-6 hours as needed for wheezing. 4)  Flovent  Diskus 50 Mcg/blist Aepb (Fluticasone propionate (inhal)) .Marland Kitchen.. 1 puff two times a day 5)  Lorazepam 0.5 Mg Tabs (Lorazepam) .Marland Kitchen.. 1 tab as needed for anxiety.  Patient Instructions: 1)  Please schedule a follow-up appointment in 6 months. 2)  If your breathing gets worse, please call us or seek medical help.  Prevention & Chronic Care Immunizations   Influenza vaccine: Not documented   Influenza vaccine deferral: Deferred  (06/07/2009)   Influenza vaccine due: 07/21/2009    Tetanus booster: Not documented   Td booster deferral: Deferred  (06/07/2009)    Pneumococcal vaccine: Not documented   Pneumococcal vaccine deferral: Deferred  (06/07/2009)  Other Screening   Pap smear: Not documented    Mammogram: ASSESSMENT: Negative - BI-RADS 1^MM DIGITAL SCREENING  (06/10/2009)   Mammogram action/deferral: Screening mammogram in 1 year.     (06/10/2009)   Mammogram due: 06/2010   Smoking status: quit  (03/09/2010)  Lipids   Total Cholesterol: 167  (06/07/2009)   Lipid panel action/deferral: Lipid Panel ordered   LDL: 84  (06/07/2009)   LDL Direct: Not documented    HDL: 58  (06/07/2009)   Triglycerides: 126  (06/07/2009)  Hypertension   Last Blood Pressure: 125 / 87  (03/09/2010)   Serum creatinine: 0.87  (02/03/2010)   BMP action: Ordered   Serum potassium 4.2  (02/03/2010)  Self-Management Support :   Personal Goals (by the next clinic visit) :      Personal blood pressure goal: 140/90  (03/09/2010)   Patient will work on the following items until the next clinic visit to reach self-care goals:     Medications and monitoring: take my medicines every day, check my blood sugar  (03/09/2010)    Hypertension self-management support: Resources for patients handout  (03/09/2010)      Resource handout printed.

## 2010-12-22 NOTE — Assessment & Plan Note (Signed)
Summary: severe pain between shoulders, spinal area/pcp-regalado/hla   Vital Signs:  Patient profile:   45 year old female Height:      62.5 inches (158.75 cm) Weight:      212.0 pounds (97.14 kg) BMI:     38.60 Temp:     98.0 degrees F (36.67 degrees C) oral Pulse rate:   84 / minute BP sitting:   132 / 96  (right arm) Cuff size:   large  Vitals Entered By: Theotis Barrio NT II (Mar 21, 2010 10:20 AM) Is Patient Diabetic? No Research Study Name: WHILE PATIENT WAS IN THE HOSPITAL IN APRIL 011 Pain Assessment Patient in pain? yes     Location: shoulder Intensity:       5 Type: sharp Onset of pain  APRIL 011 Nutritional Status BMI of > 30 = obese  Have you ever been in a relationship where you felt threatened, hurt or afraid?No   Does patient need assistance? Functional Status Self care Ambulation Normal   Primary Care Provider:  Hartley Barefoot MD   History of Present Illness: 45 year old lady with PMH as mentioned in the EMR comes to the office for filling up her FMLA forms.  Patient has been reporting both shoulder pains for the last 3 weeks and reports that the pains have been episodic and during flare ups, she couldnt able to work full time. She is aslo requesting to see an orthopedic doctor.  She denies any other complaints.  Problems Prior to Update: 1)  Shoulder Pain, Bilateral  (ICD-719.41) 2)  Asthma  (ICD-493.90) 3)  Screening For Lipoid Disorders  (ICD-V77.91) 4)  Esophageal Reflux  (ICD-530.81) 5)  Influenza  (ICD-487.8) 6)  Acute Bronchitis  (ICD-466.0) 7)  Nevus  (ICD-216.9) 8)  Skin Tag  (ICD-701.9) 9)  Glucose Tlrnc Abnrm, Mthr Cmplg Prg, Unspc  (ICD-648.80) 10)  Sleep Apnea, Obstructive, Moderate  (ICD-327.23) 11)  Fatigue, Chronic  (ICD-780.79) 12)  Menorrhagia  (ICD-626.2) 13)  Hypertension  (ICD-401.9) 14)  Headache  (ICD-784.0)  Medications Prior to Update: 1)  Lisinopril 30 Mg Tabs (Lisinopril) .... Take 1 Tablet By Mouth Once A  Day 2)  Hydrochlorothiazide 25 Mg Tabs (Hydrochlorothiazide) .... Take 1 Tablet By Mouth Once A Day 3)  Ventolin Hfa 108 (90 Base) Mcg/act Aers (Albuterol Sulfate) .... Inhale 2 Puffs Every 4-6 Hours As Needed For Wheezing. 4)  Flovent Diskus 50 Mcg/blist Aepb (Fluticasone Propionate (Inhal)) .Marland Kitchen.. 1 Puff Two Times A Day 5)  Lorazepam 0.5 Mg Tabs (Lorazepam) .Marland Kitchen.. 1 Tab As Needed For Anxiety.  Current Medications (verified): 1)  Lisinopril 30 Mg Tabs (Lisinopril) .... Take 1 Tablet By Mouth Once A Day 2)  Hydrochlorothiazide 25 Mg Tabs (Hydrochlorothiazide) .... Take 1 Tablet By Mouth Once A Day 3)  Ventolin Hfa 108 (90 Base) Mcg/act Aers (Albuterol Sulfate) .... Inhale 2 Puffs Every 4-6 Hours As Needed For Wheezing. 4)  Flovent Diskus 50 Mcg/blist Aepb (Fluticasone Propionate (Inhal)) .Marland Kitchen.. 1 Puff Two Times A Day 5)  Lorazepam 0.5 Mg Tabs (Lorazepam) .Marland Kitchen.. 1 Tab As Needed For Anxiety.  Allergies (verified): No Known Drug Allergies  Past History:  Past Medical History: Last updated: 03/20/2007 Headache, daily Hypertension Menorrhagia, normal ultrasound 11-19-06 Fatigue Obstructive sleep apnea      On CPAP (going back to get O2 titrated) Hx mass left breast, repeat mammogram and ultrasound negative Hx right rotator cuff tendinitis july 2007 Prediabetes (Fasting glucose 119 03/05/07)  Risk Factors: Diet: NSTRF4 (03/09/2010) Exercise: no (02/03/2010)  Review of Systems      See HPI  Physical Exam  General:  alert, well-developed, and well-nourished.   Head:  normocephalic, atraumatic, and no abnormalities observed.   Lungs:  normal respiratory effort, no intercostal retractions, no accessory muscle use, and normal breath sounds.   Heart:  normal rate and regular rhythm.   Pulses:  R radial normal.   Extremities:  trace left pedal edema and trace right pedal edema.     Shoulder/Elbow Exam  General:    Well-developed, well-nourished, normal body habitus; no deformities, normal  grooming.    Skin:    Intact, no scars, lesions, rashes, cafe au lait spots or bruising.    Inspection:    Inspection is normal.    Motor:    ROM at shoulders reduced in all directions.  Extension beyond 90 degrees is painful. Anterior and posterior movements at shoulder at partially imited by pain. No tenderness to palpation   Impression & Recommendations:  Problem # 1:  SHOULDER PAIN, BILATERAL (ICD-719.41)  Xray reveal moderate degenerative changes c/w with her pain. She is been taking tylenol for pain. Will refer her to orthopedics for further management. Will fill FMLA forms just incase if she needs during the flare up. Spent more than 20 minutes filling up her papers.   Orders: Orthopedic Referral (Ortho)  Problem # 2:  ASTHMA (ICD-493.90) Well controlled. Cont current regimen.  Her updated medication list for this problem includes:    Ventolin Hfa 108 (90 Base) Mcg/act Aers (Albuterol sulfate) ..... Inhale 2 puffs every 4-6 hours as needed for wheezing.    Flovent Diskus 50 Mcg/blist Aepb (Fluticasone propionate (inhal)) .Marland Kitchen... 1 puff two times a day  Problem # 3:  HYPERTENSION (ICD-401.9) Well controlled. Continue current regimen.  Her updated medication list for this problem includes:    Lisinopril 30 Mg Tabs (Lisinopril) .Marland Kitchen... Take 1 tablet by mouth once a day    Hydrochlorothiazide 25 Mg Tabs (Hydrochlorothiazide) .Marland Kitchen... Take 1 tablet by mouth once a day  Complete Medication List: 1)  Lisinopril 30 Mg Tabs (Lisinopril) .... Take 1 tablet by mouth once a day 2)  Hydrochlorothiazide 25 Mg Tabs (Hydrochlorothiazide) .... Take 1 tablet by mouth once a day 3)  Ventolin Hfa 108 (90 Base) Mcg/act Aers (Albuterol sulfate) .... Inhale 2 puffs every 4-6 hours as needed for wheezing. 4)  Flovent Diskus 50 Mcg/blist Aepb (Fluticasone propionate (inhal)) .Marland Kitchen.. 1 puff two times a day 5)  Lorazepam 0.5 Mg Tabs (Lorazepam) .Marland Kitchen.. 1 tab as needed for anxiety.  Patient  Instructions: 1)  Please schedule a follow-up appointment in 6 months. 2)  See orthopedic doctor as advised.   Prevention & Chronic Care Immunizations   Influenza vaccine: Not documented   Influenza vaccine deferral: Deferred  (06/07/2009)   Influenza vaccine due: 07/21/2009    Tetanus booster: Not documented   Td booster deferral: Deferred  (06/07/2009)    Pneumococcal vaccine: Not documented   Pneumococcal vaccine deferral: Deferred  (06/07/2009)  Other Screening   Pap smear: Not documented    Mammogram: ASSESSMENT: Negative - BI-RADS 1^MM DIGITAL SCREENING  (06/10/2009)   Mammogram action/deferral: Screening mammogram in 1 year.     (06/10/2009)   Mammogram due: 06/2010   Smoking status: quit  (03/09/2010)  Lipids   Total Cholesterol: 167  (06/07/2009)   Lipid panel action/deferral: Lipid Panel ordered   LDL: 84  (06/07/2009)   LDL Direct: Not documented   HDL: 58  (06/07/2009)   Triglycerides: 126  (  06/07/2009)  Hypertension   Last Blood Pressure: 132 / 96  (03/21/2010)   Serum creatinine: 0.87  (02/03/2010)   BMP action: Ordered   Serum potassium 4.2  (02/03/2010)  Self-Management Support :   Personal Goals (by the next clinic visit) :      Personal blood pressure goal: 140/90  (03/09/2010)   Patient will work on the following items until the next clinic visit to reach self-care goals:     Medications and monitoring: take my medicines every day  (03/21/2010)     Eating: drink diet soda or water instead of juice or soda, eat more vegetables, use fresh or frozen vegetables, eat foods that are low in salt, eat baked foods instead of fried foods  (03/21/2010)     Activity: take a 30 minute walk every day  (03/21/2010)    Hypertension self-management support: Resources for patients handout  (03/21/2010)      Resource handout printed.

## 2010-12-22 NOTE — Letter (Signed)
Summary: CORRECTED ON FORM   CORRECTED ON FORM   Imported By: Margie Billet 04/05/2010 14:47:32  _____________________________________________________________________  External Attachment:    Type:   Image     Comment:   External Document

## 2010-12-22 NOTE — Progress Notes (Signed)
Summary: appt/ hla  Phone Note Call from Patient   Summary of Call: pt calls c/o severe pain between shoulders, ongoing, describes as in spinal area, states it is not a shoulder problem, desires appt for possible muscle relaxant. Initial call taken by: Marin Roberts RN,  March 15, 2010 1:58 PM

## 2010-12-22 NOTE — Assessment & Plan Note (Signed)
Summary: (REGALADO)/ SB.   Vital Signs:  Patient profile:   45 year old female Height:      62.5 inches Weight:      213.8 pounds BMI:     38.62 Temp:     97.5 degrees F oral Pulse rate:   82 / minute BP sitting:   136 / 94  (right arm)  Vitals Entered By: Filomena Jungling NT II (April 26, 2010 10:12 AM) CC: [facility] Is Patient Diabetic? No Pain Assessment Patient in pain? no      Nutritional Status BMI of > 30 = obese  Have you ever been in a relationship where you felt threatened, hurt or afraid?No   Does patient need assistance? Functional Status Self care Ambulation Normal   Primary Care Provider:  Hartley Barefoot MD  CC:  [facility].  History of Present Illness: Crystal English is a 45 year old Female with PMH/problems as outlined in the EMR, who presents to the [facility] with chief complaint(s) of:    1. Chest pain: recent hospital admission to [facility] for the same. Had chest pain on Tuesday last week, woke up with it, one day later pain became more severe and thought she was having a heart attack. Following day pain persisted, pain got worse and worse. Went to [place]. Was kept overnight. Got a stress test, that was negative. Noted to have high blood sugar. She was asked to go to PCP for follow up on her blood sugar.   Preventive Screening-Counseling & Management  Alcohol-Tobacco     Smoking Status: quit     Smoking Cessation Counseling: yes     Packs/Day: 1-2 cigs a day     Year Started: stopped last Thursday     Passive Smoke Exposure: yes  Caffeine-Diet-Exercise     Diet Comments: NSTRF4     Does Patient Exercise: no  Current Medications (verified): 1)  Lisinopril 30 Mg Tabs (Lisinopril) .... Take 1 Tablet By Mouth Once A Day 2)  Hydrochlorothiazide 25 Mg Tabs (Hydrochlorothiazide) .... Take 1 Tablet By Mouth Once A Day 3)   Ventolin Hfa 108 (90 Base) Mcg/act Aers (Albuterol Sulfate) .... Inhale 2 Puffs Every 4-6 Hours As Needed For Wheezing. 4)  Flovent Diskus 50 Mcg/blist Aepb (Fluticasone Propionate (Inhal)) .Marland Kitchen.. 1 Puff Two Times A Day 5)  Lorazepam 0.5 Mg Tabs (Lorazepam) .Marland Kitchen.. 1 Tab As Needed For Anxiety.  Allergies (verified): No Known Drug Allergies  Past History:  Past Medical History: Last updated: 03/20/2007 Headache, daily Hypertension Menorrhagia, normal ultrasound 11-19-06 Fatigue Obstructive sleep apnea      On CPAP (going back to get O2 titrated) Hx mass left breast, repeat mammogram and ultrasound negative Hx right rotator cuff tendinitis july 2007 Prediabetes (Fasting glucose 119 03/05/07)  Social History: Last updated: 08/21/2008 Has a 62 yr old son, a dgtr and a granddgtr that live with her. Sings in church. Got laid off from work and is looking for a new job.  Risk Factors: Diet: NSTRF4 (04/26/2010) Exercise: no (04/26/2010)  Risk Factors: Smoking Status: quit (04/26/2010) Packs/Day: 1-2 cigs a day (04/26/2010) Passive Smoke Exposure: yes (04/26/2010)  Review of Systems      See HPI  Physical Exam  General:  alert and well-developed.   Head:  normocephalic and atraumatic.   Eyes:  vision grossly intact, pupils equal, and pupils round.  Lungs:  normal respiratory effort and normal breath sounds.   Heart:  normal rate and regular rhythm.   Abdomen:  soft and non-tender.   Pulses:  normal peripheral pulses  Extremities:  no cyanosis, clubbing or edema  Neurologic:  non focal.  Psych:  Oriented X3 and normally interactive.     Impression & Recommendations:  Problem # 1:  CHEST PAIN (ICD-786.50) Recent "negative" work up at [facility]. Will get records of the hospitalization. No further episodes.   Problem # 2:  HYPERTENSION (ICD-401.9) Did not take meds today. Asked to take meds without fail.  Her updated medication list for this problem includes:    Lisinopril 30  Mg Tabs (Lisinopril) .Marland Kitchen... Take 1 tablet by mouth once a day    Hydrochlorothiazide 25 Mg Tabs (Hydrochlorothiazide) .Marland Kitchen... Take 1 tablet by mouth once a day  Orders: T-Basic Metabolic Panel [id number]) T-Lipid Profile [id number]) T-Hgb A1C (in-house) ([id number])  Problem # 3:  GLUCOSE TLRNC ABNRM, MTHR CMPLG PRG, UNSPC (ICD-648.80) Will check fasting b-met and review.   Orders: T-Basic Metabolic Panel [id number]) T-Lipid Profile [id number]) T-Hgb A1C (in-house) ([id number])  Complete Medication List: 1)  Lisinopril 30 Mg Tabs (Lisinopril) .... Take 1 tablet by mouth once a day 2)  Hydrochlorothiazide 25 Mg Tabs (Hydrochlorothiazide) .... Take 1 tablet by mouth once a day 3)  Ventolin Hfa 108 (90 Base) Mcg/act Aers (Albuterol sulfate) .... Inhale 2 puffs every 4-6 hours as needed for wheezing. 4)  Flovent Diskus 50 Mcg/blist Aepb (Fluticasone propionate (inhal)) .Marland Kitchen.. 1 puff two times a day 5)  Lorazepam 0.5 Mg Tabs (Lorazepam) .Marland Kitchen.. 1 tab as needed for anxiety.  Patient Instructions: 1)  Please schedule a follow-up appointment in 3 months. 2)  We will let you know if anything wrong with your lab work (on your phone [phone])  Process Orders Check Orders Results:     [facility]: ABN not required for this insurance Tests Sent for requisitioning (April 26, 2010 11:57 AM):     04/26/2010: [facility] -- T-Basic Metabolic Panel [id number] (signed)     04/26/2010: [facility] -- T-Lipid Profile [phone] (signed)    Prevention & Chronic Care Immunizations   Influenza vaccine: Not documented   Influenza vaccine deferral: Deferred  (06/07/2009)   Influenza vaccine due: 07/21/2009    Tetanus booster: Not documented   Td booster deferral: Deferred  (06/07/2009)    Pneumococcal vaccine: Not documented   Pneumococcal vaccine deferral: Deferred  (06/07/2009)  Other Screening   Pap smear: Not documented    Mammogram:  ASSESSMENT: Negative - BI-RADS 1^MM DIGITAL SCREENING  (06/10/2009)   Mammogram action/deferral: Screening mammogram in 1 year.     (06/10/2009)   Mammogram due: 06/2010   Smoking status: quit  (04/26/2010)  Lipids   Total Cholesterol: 167  (06/07/2009)   Lipid panel action/deferral: Lipid Panel ordered   LDL: 84  (06/07/2009)   LDL Direct: Not documented   HDL: 58  (06/07/2009)   Triglycerides: 126  (06/07/2009)  Hypertension   Last Blood Pressure: 136 / 94  (04/26/2010)   Serum creatinine: 0.87  (02/03/2010)   BMP action: Ordered   Serum potassium 4.2  (02/03/2010)    Hypertension flowsheet reviewed?: Yes   Progress toward BP goal: Unchanged  Self-Management Support :   Personal Goals (by the next clinic visit) :      Personal blood pressure goal: 140/90  (03/09/2010)   Hypertension self-management support: Resources for patients handout  (03/21/2010)  Laboratory Results   Blood Tests   Date/Time Received: April 26, 2010 11:08 AM  Date/Time Reported: Alric Quan  April 26, 2010 11:08 AM   HGBA1C: 6.3%   (Normal Range: Non-Diabetic - 3-6%   Control Diabetic - 6-8%)

## 2010-12-22 NOTE — Letter (Signed)
Summary: Generic Letter  [facility]  [address]   [place], [place] [address]   Phone: [phone]  Fax: [phone]    03/21/2010  NUSAYBAH IVIE [address] [place], [place]  [address]  Dear Ms. Clinton Sawyer,           Sincerely,   Blondell Reveal MD

## 2010-12-22 NOTE — Progress Notes (Signed)
Summary: jphone/gg  Phone Note Call from Patient   Caller: Patient Summary of Call: Pt called with c/o feet hurt with walking, legs hurt when laying, shoulders and arms hurt.  Onset 3 weeks ago.  headache every day.  She is taking IBU 800 mg daily with no relief.  Also tried xanax with no relief. No other symptoms.   no injury. Starts new job on [employer].    We have no appointments this week. Pt # [id number] Initial call taken by: Merrie Roof RN,  August 15, 2010 4:29 PM  Follow-up for Phone Call        Would advise patient to go to urgent care clinic. Follow-up by: Margarito Liner MD,  August 15, 2010 4:40 PM  Additional Follow-up for Phone Call Additional follow up Details #1::        pt informed Patient/caller verbalizes understanding of these instructions.  Additional Follow-up by: Merrie Roof RN,  August 15, 2010 5:29 PM

## 2010-12-22 NOTE — Assessment & Plan Note (Signed)
Summary: ROUTINE CK/EST/VS   Vital Signs:  Patient profile:   45 year old female Height:      62.5 inches Weight:      213.5 pounds BMI:     38.57 Temp:     97.5 degrees F oral Pulse rate:   82 / minute BP sitting:   136 / 87  (right arm)  Vitals Entered By: Filomena Jungling NT II (February 03, 2010 3:45 PM) CC: need refills Is Patient Diabetic? No Pain Assessment Patient in pain? no      Nutritional Status BMI of > 30 = obese  Have you ever been in a relationship where you felt threatened, hurt or afraid?No   Does patient need assistance? Functional Status Self care Ambulation Normal   Primary Care Provider:  Hartley Barefoot MD  CC:  need refills.  History of Present Illness: 45 year old with Past Medical History: Headache, daily Hypertension Menorrhagia, normal ultrasound 11-19-06 Fatigue Obstructive sleep apnea      On CPAP (going back to get O2 titrated) Hx mass left breast, repeat mammogram and ultrasound negative Hx right rotator cuff tendinitis july 2007 Prediabetes (Fasting glucose 119 03/05/07)  She wants to lose some weight. Her mother was diagnosed with Diabetes. She denies chest pain, dyspnea, no cough. She is working on diet and exercise.    Preventive Screening-Counseling & Management  Alcohol-Tobacco     Smoking Status: quit     Smoking Cessation Counseling: yes     Packs/Day: 1-2 cigs a day     Year Started: stopped last Thursday     Passive Smoke Exposure: yes  Caffeine-Diet-Exercise     Does Patient Exercise: no  Current Medications (verified): 1)  Lisinopril 30 Mg Tabs (Lisinopril) .... Take 1 Tablet By Mouth Once A Day 2)  Hydrochlorothiazide 25 Mg Tabs (Hydrochlorothiazide) .... Take 1 Tablet By Mouth Once A Day 3)  Ventolin Hfa 108 (90 Base) Mcg/act Aers (Albuterol Sulfate) .... Inhale 2 Puffs Every 4-6 Hours As Needed For Wheezing.  Allergies: No Known Drug Allergies  Review of Systems  The patient denies fever, chest pain, syncope,  dyspnea on exertion, peripheral edema, prolonged cough, hemoptysis, abdominal pain, melena, hematochezia, and severe indigestion/heartburn.    Physical Exam  General:  alert, well-developed, and well-nourished.   Head:  normocephalic, atraumatic, and no abnormalities observed.   Lungs:  normal respiratory effort, no intercostal retractions, no accessory muscle use, and normal breath sounds.   Heart:  normal rate and regular rhythm.   Abdomen:  soft, non-tender, normal bowel sounds, no distention, and no masses.   Extremities:  No edema.  Neurologic:  alert & oriented X3, cranial nerves II-XII intact, strength normal in all extremities, and sensation intact to light touch.     Impression & Recommendations:  Problem # 1:  HYPERTENSION (ICD-401.9) I will continue with current regimen. She will work on diet and exercise. I will check Cmet today.  Her updated medication list for this problem includes:    Lisinopril 30 Mg Tabs (Lisinopril) .Marland Kitchen... Take 1 tablet by mouth once a day    Hydrochlorothiazide 25 Mg Tabs (Hydrochlorothiazide) .Marland Kitchen... Take 1 tablet by mouth once a day  Orders: T-Comprehensive Metabolic Panel ([id number])  BP today: 136/87 Prior BP: 159/105 (06/07/2009)  Labs Reviewed: K+: 5.0 (06/07/2009) Creat: : 1.05 (06/07/2009)   Chol: 167 (06/07/2009)   HDL: 58 (06/07/2009)   LDL: 84 (06/07/2009)   TG: 126 (06/07/2009)  Problem # 2:  SCREENING FOR LIPOID DISORDERS (ICD-V77.91)  I will check lipid panel.   Problem # 3:  Preventive Health Care (ICD-V70.0) She wants to lose some wight. She is working on diet and exercise.   Complete Medication List: 1)  Lisinopril 30 Mg Tabs (Lisinopril) .... Take 1 tablet by mouth once a day 2)  Hydrochlorothiazide 25 Mg Tabs (Hydrochlorothiazide) .... Take 1 tablet by mouth once a day 3)  Ventolin Hfa 108 (90 Base) Mcg/act Aers (Albuterol sulfate) .... Inhale 2 puffs every 4-6 hours as needed for wheezing.  Patient Instructions: 1)   Please schedule a follow-up appointment in 2 months, morningMay.  Prescriptions: HYDROCHLOROTHIAZIDE 25 MG TABS (HYDROCHLOROTHIAZIDE) Take 1 tablet by mouth once a day  #30 x 6   Entered and Authorized by:   Hartley Barefoot MD   Signed by:   Hartley Barefoot MD on 02/03/2010   Method used:   Electronically to        [facility] [phone]* (retail)       [address]       [place], [place]  [address]       Ph: [phone]       Fax: [phone]   RxID:   [id number] LISINOPRIL 30 MG TABS (LISINOPRIL) Take 1 tablet by mouth once a day  #31 x 6   Entered and Authorized by:   Hartley Barefoot MD   Signed by:   Hartley Barefoot MD on 02/03/2010   Method used:   Electronically to        [facility] [address] [phone]* (retail)       [address]       [place], [place]  [address]       Ph: [phone]       Fax: [phone]   RxID:   [id number]    Prevention & Chronic Care Immunizations   Influenza vaccine: Not documented   Influenza vaccine deferral: Deferred  (06/07/2009)   Influenza vaccine due: 07/21/2009    Tetanus booster: Not documented   Td booster deferral: Deferred  (06/07/2009)    Pneumococcal vaccine: Not documented   Pneumococcal vaccine deferral: Deferred  (06/07/2009)  Other Screening   Pap smear: Not documented    Mammogram: ASSESSMENT: Negative - BI-RADS 1^MM DIGITAL SCREENING  (06/10/2009)   Mammogram action/deferral: Screening mammogram in 1 year.     (06/10/2009)   Mammogram due: 06/2010   Smoking status: quit  (02/03/2010)  Lipids   Total Cholesterol: 167  (06/07/2009)   Lipid panel action/deferral: Lipid Panel ordered   LDL: 84  (06/07/2009)   LDL Direct: Not documented   HDL: 58  (06/07/2009)   Triglycerides: 126  (06/07/2009)  Hypertension   Last Blood Pressure: 136 / 87  (02/03/2010)   Serum creatinine: 1.05  (06/07/2009)   BMP action: Ordered   Serum potassium 5.0  (06/07/2009) CMP ordered     Hypertension flowsheet reviewed?:  Yes   Progress toward BP goal: At goal  Self-Management Support :    Hypertension self-management support: Written self-care plan  (02/03/2010)   Hypertension self-care plan printed.   Process Orders Check Orders Results:     [facility]: ABN not required for this insurance Tests Sent for requisitioning (February 04, 2010 9:36 AM):     02/03/2010: [facility] -- T-Comprehensive Metabolic Panel [phone] (signed)

## 2010-12-22 NOTE — Progress Notes (Signed)
  Phone Note Outgoing Call   Call placed by: Theotis Barrio NT II,  Mar 22, 2010 10:33 AM Call placed to: Patient Details for Reason: ORTHO REFERRAL Summary of Call: CALLED PATIENT AND LEFT VOICE MAIL CONCERNING ORTHO REFERRAL FOR MAY 5, 011 WITH [facility] [phone] MAY 5, 011 @ 9:00AM (8:45AM).  PATIENT TO CALL OFFICE IF THIS IS NOT A GOOD APPT TIME FOR HER.AND TO CALL [facility] IF SHE HAS ANY QUESTIONS ABOUT THIS APPT. Lela Sturdivant NT II  Mar 22, 2010 10:39 AM

## 2010-12-22 NOTE — Progress Notes (Signed)
Summary: Form for FMLA  Phone Note Call from Patient   Caller: Patient Summary of Call: Call from pt.  Needs to have a date that she can return to 8 hours per day at work.  Pt has previously done 4 hours only.  Pt started 8 hours back on 03/21/2010.  Needs to go under section 7 of the FMLA Form and faxed back to [phone].  Angelina Ok RN  Mar 31, 2010 9:49 AM   Initial call taken by: Angelina Ok RN,  Mar 31, 2010 9:49 AM  Follow-up for Phone Call        She can start working full time anytime when she is ready, even today. I told her at my last visit itself that she could work full time whenever her pains have improved.  Follow-up by: Blondell Reveal MD,  Apr 01, 2010 11:54 AM

## 2010-12-22 NOTE — Letter (Signed)
Summary: FMLA NOTICE OF ELIGIBILTY  FMLA NOTICE OF ELIGIBILTY   Imported By: Margie Billet 03/22/2010 09:52:20  _____________________________________________________________________  External Attachment:    Type:   Image     Comment:   External Document

## 2010-12-22 NOTE — Miscellaneous (Signed)
Summary: hospital discharge (asthma)  Hospital Discharge  Date of admission: 02/28/2010  Date of discharge: 03/02/2010  Brief reason for admission/active problems: asthma exacerbation  Followup needed: - Outpatient PFTs for diagnosis (athma/COPD) - medication/SW assistance for inhalers (contact with Jaynee Eagles) - prednsione taper completion -started in the hospital. - PEFs measurement (250 at time of d/c) - BP- increased lisinopril for better control - CPAP titration- diagnosed with OSA in past, never went for titration. Desated at night in the hospital once.  - hbaic-6.5 in the hospital. furhter follow up after prednisone taper completed.    The medication and problem lists have been updated.  Please see the dictated discharge summary for details.    Prescriptions: LORAZEPAM 0.5 MG TABS (LORAZEPAM) 1 tab as needed for anxiety.  #15 x 0   Entered and Authorized by:   Bethel Born MD   Signed by:   Bethel Born MD on 03/02/2010   Method used:   Handwritten   RxID:   [id number]    Patient Instructions: 1)  Your next appointment with [facility] is on April 20th at 2 pm 2)  Tobacco is very bad for your health and your loved ones! You Should stop smoking!. 3)  Stop Smoking Tips: Choose a Quit date. Cut down before the Quit date. decide what you will do as a substitute when you feel the urge to smoke(gum,toothpick,exercise).

## 2010-12-22 NOTE — Progress Notes (Signed)
Summary: phone/gg  Phone Note Call from Patient   Caller: Patient Complaint: Breathing Problems Summary of Call: Pt called asking for an excuse for work for  1/2 a day this week until she is seen on Wednesday.  Pt  states she  has Hx of asthma and wants to be seen for this on Wednesday. Hopsital d/c 4/13 for acute exacerbation of asthma.  she was d/c and note written to excuse from work that week.   I returned call and pt not at home.  Message left to call clinic.    Initial call taken by: Merrie Roof RN,  March 07, 2010 9:33 AM  Follow-up for Phone Call        talked with pt and she will get the note she needs on Wed. when she is seen in clinic. Follow-up by: Merrie Roof RN,  March 07, 2010 10:59 AM

## 2011-01-31 ENCOUNTER — Telehealth: Payer: Self-pay | Admitting: *Deleted

## 2011-01-31 ENCOUNTER — Inpatient Hospital Stay (INDEPENDENT_AMBULATORY_CARE_PROVIDER_SITE_OTHER)
Admission: RE | Admit: 2011-01-31 | Discharge: 2011-01-31 | Disposition: A | Discharge status: Home / Self Care | Payer: Self-pay | Source: Ambulatory Visit | Attending: Emergency Medicine | Admitting: Emergency Medicine

## 2011-01-31 DIAGNOSIS — J45909 Unspecified asthma, uncomplicated: Secondary | ICD-10-CM

## 2011-01-31 NOTE — Telephone Encounter (Signed)
Pt called with c/o SOB since this morning.  She is using her inhaler without relief. We don't have any appointments open today so pt advised to go to ED or [facility] for  Evaluation.  This should not wait until tomorrow.

## 2011-02-08 LAB — BASIC METABOLIC PANEL
BUN: 11 mg/dL (ref 6–23)
BUN: 12 mg/dL (ref 6–23)
CO2: 26 mEq/L (ref 19–32)
CO2: 26 mEq/L (ref 19–32)
Calcium: 9.4 mg/dL (ref 8.4–10.5)
Calcium: 9.5 mg/dL (ref 8.4–10.5)
Chloride: 102 mEq/L (ref 96–112)
Chloride: 105 mEq/L (ref 96–112)
Potassium: 4.2 mEq/L (ref 3.5–5.1)

## 2011-02-08 LAB — CBC
Hemoglobin: 13.4 g/dL (ref 12.0–15.0)
Hemoglobin: 14.2 g/dL (ref 12.0–15.0)
MCHC: 33.2 g/dL (ref 30.0–36.0)
MCHC: 33.2 g/dL (ref 30.0–36.0)
RBC: 4.36 MIL/uL (ref 3.87–5.11)
RDW: 15.4 % (ref 11.5–15.5)

## 2011-02-08 LAB — POCT I-STAT, CHEM 8
BUN: 7 mg/dL (ref 6–23)
Chloride: 101 mEq/L (ref 96–112)
Creatinine, Ser: 0.9 mg/dL (ref 0.4–1.2)
Glucose, Bld: 165 mg/dL — ABNORMAL HIGH (ref 70–99)
HCT: 46 % (ref 36.0–46.0)
Hemoglobin: 15.6 g/dL — ABNORMAL HIGH (ref 12.0–15.0)
Sodium: 138 mEq/L (ref 135–145)

## 2011-02-08 LAB — HEMOGLOBIN A1C
Hgb A1c MFr Bld: 6.5 % — ABNORMAL HIGH (ref 4.6–6.1)
Mean Plasma Glucose: 140 mg/dL

## 2011-02-08 LAB — DIFFERENTIAL
Eosinophils Relative: 0 % (ref 0–5)
Lymphocytes Relative: 4 % — ABNORMAL LOW (ref 12–46)
Lymphs Abs: 0.6 10*3/uL — ABNORMAL LOW (ref 0.7–4.0)
Monocytes Absolute: 0.3 10*3/uL (ref 0.1–1.0)
Neutro Abs: 13 10*3/uL — ABNORMAL HIGH (ref 1.7–7.7)

## 2011-02-08 LAB — BRAIN NATRIURETIC PEPTIDE: Pro B Natriuretic peptide (BNP): 45 pg/mL (ref 0.0–100.0)

## 2011-02-24 LAB — URINALYSIS, ROUTINE W REFLEX MICROSCOPIC
Glucose, UA: NEGATIVE mg/dL
Ketones, ur: NEGATIVE mg/dL
Nitrite: NEGATIVE
Specific Gravity, Urine: 1.025 (ref 1.005–1.030)
pH: 6 (ref 5.0–8.0)

## 2011-02-24 LAB — GC/CHLAMYDIA PROBE AMP, GENITAL
Chlamydia, DNA Probe: NEGATIVE
GC Probe Amp, Genital: NEGATIVE

## 2011-02-24 LAB — POCT PREGNANCY, URINE: Preg Test, Ur: NEGATIVE

## 2011-02-24 LAB — WET PREP, GENITAL

## 2011-02-24 LAB — URINE MICROSCOPIC-ADD ON

## 2011-03-08 ENCOUNTER — Encounter: Payer: Self-pay | Admitting: Internal Medicine

## 2011-03-21 ENCOUNTER — Encounter: Payer: Self-pay | Admitting: Internal Medicine

## 2011-04-07 NOTE — Procedures (Signed)
NAME:  Crystal English, Crystal English NO.:  [id number]   MEDICAL RECORD NO.:  [id number]          PATIENT TYPE:  OUT   LOCATION:  SLEEP CENTER                 FACILITY:  [facility]   PHYSICIAN:  Clinton D. Maple Hudson, MD, FCCP, FACPDATE OF BIRTH:  11-15-65   DATE OF STUDY:                            NOCTURNAL POLYSOMNOGRAM   INDICATION FOR STUDY:  Hypersomnia with sleep apnea.   EPWORTH SLEEPINESS SCORE:  9/24. BMI 41, weight 220 pounds.   DIAGNOSTIC NPSG PROTOCOL REQUESTED:  No home medications.   SLEEP ARCHITECTURE:  Total sleep time 439 minutes of sleep efficiency  95%. Stage 1 was 2%, stage 2 54%, stages 3 and 4 22%, REM 23% of total  sleep time. Sleep latency 21 minutes, REM latency 81 minutes, awake  after sleep onset 6 minutes. Arousal index 10.5. No bedtime medication  was taken.   RESPIRATORY DATA:  NPSG protocol. Apnea/hypopnea index (AHI, RDI) 38.8  obstructive events per hour indicating moderate obstructive sleep  apnea/hypopnea syndrome. There were 194 obstructive apnea's and 90  hypopnea's. Events were not positional. REM AHI 101.2.   OXYGEN DATA:  Moderate snoring with oxygen desaturation to a nadir of  71%. Mean oxygen saturation through the study was 94/% on room air.   CARDIAC DATA:  Normal sinus rhythm.   MOVEMENT-PARASOMNIA:  Occasional limb jerk with little effect on sleep.   IMPRESSIONS-RECOMMENDATIONS:  1. Moderately severe obstructive sleep apnea/hypopnea syndrome with      non positional events, moderate snoring and oxygen desaturation to      a nadir of 71%.  2. Consider return for CPAP titration study or evaluate for      alternative therapies as appropriate.      Clinton D. Maple Hudson, MD, [facility], FACP  Diplomate, Biomedical engineer of Sleep Medicine  Electronically Signed     CDY/MEDQ  D:  12/23/2006 11:00:20  T:  12/23/2006 20:29:33  Job:  [id number]

## 2011-04-24 ENCOUNTER — Other Ambulatory Visit: Payer: Self-pay | Admitting: *Deleted

## 2011-04-26 MED ORDER — LISINOPRIL 30 MG PO TABS: 30.0000 mg | ORAL_TABLET | Freq: Every day | ORAL | Status: DC

## 2011-04-26 MED ORDER — HYDROCHLOROTHIAZIDE 25 MG PO TABS: 25.0000 mg | ORAL_TABLET | Freq: Every day | ORAL | Status: DC

## 2011-08-21 LAB — URINE MICROSCOPIC-ADD ON

## 2011-08-21 LAB — URINALYSIS, ROUTINE W REFLEX MICROSCOPIC
Bilirubin Urine: NEGATIVE
Ketones, ur: NEGATIVE
Nitrite: NEGATIVE
pH: 7

## 2011-08-21 LAB — DIFFERENTIAL
Basophils Absolute: 0
Eosinophils Relative: 1
Lymphocytes Relative: 6 — ABNORMAL LOW
Monocytes Absolute: 0.4
Monocytes Relative: 6

## 2011-08-21 LAB — URINE CULTURE: Colony Count: >=100000

## 2011-08-21 LAB — CBC
HCT: 40.3
Hemoglobin: 13.5
MCHC: 33.5
RBC: 4.43
RDW: 14.8

## 2011-08-21 LAB — POCT I-STAT, CHEM 8
BUN: 9
Calcium, Ion: 1.12
Creatinine, Ser: 1
Glucose, Bld: 120 — ABNORMAL HIGH
Hemoglobin: 14.6
Sodium: 136
TCO2: 30

## 2011-10-02 ENCOUNTER — Encounter: Payer: Self-pay | Admitting: Internal Medicine

## 2011-10-25 ENCOUNTER — Ambulatory Visit (INDEPENDENT_AMBULATORY_CARE_PROVIDER_SITE_OTHER): Payer: Self-pay | Admitting: Internal Medicine

## 2011-10-25 ENCOUNTER — Telehealth: Payer: Self-pay | Admitting: *Deleted

## 2011-10-25 ENCOUNTER — Ambulatory Visit (HOSPITAL_COMMUNITY)
Admission: RE | Admit: 2011-10-25 | Discharge: 2011-10-25 | Disposition: A | Discharge status: Home / Self Care | Payer: Self-pay | Source: Ambulatory Visit | Attending: Internal Medicine | Admitting: Internal Medicine

## 2011-10-25 ENCOUNTER — Encounter: Payer: Self-pay | Admitting: Internal Medicine

## 2011-10-25 ENCOUNTER — Other Ambulatory Visit: Payer: Self-pay | Admitting: *Deleted

## 2011-10-25 DIAGNOSIS — F17200 Nicotine dependence, unspecified, uncomplicated: Secondary | ICD-10-CM | POA: Insufficient documentation

## 2011-10-25 DIAGNOSIS — I10 Essential (primary) hypertension: Secondary | ICD-10-CM

## 2011-10-25 DIAGNOSIS — R059 Cough, unspecified: Secondary | ICD-10-CM

## 2011-10-25 DIAGNOSIS — R05 Cough: Secondary | ICD-10-CM

## 2011-10-25 DIAGNOSIS — R058 Other specified cough: Secondary | ICD-10-CM

## 2011-10-25 DIAGNOSIS — R0602 Shortness of breath: Secondary | ICD-10-CM | POA: Insufficient documentation

## 2011-10-25 LAB — CBC WITH DIFFERENTIAL/PLATELET
Basophils Absolute: 0 10*3/uL (ref 0.0–0.1)
Basophils Relative: 0 % (ref 0–1)
HCT: 42.8 % (ref 36.0–46.0)
Hemoglobin: 13.5 g/dL (ref 12.0–15.0)
Lymphocytes Relative: 13 % (ref 12–46)
MCHC: 31.5 g/dL (ref 30.0–36.0)
Monocytes Absolute: 0.6 10*3/uL (ref 0.1–1.0)
Monocytes Relative: 10 % (ref 3–12)
Neutro Abs: 4.6 10*3/uL (ref 1.7–7.7)
Neutrophils Relative %: 76 % (ref 43–77)
RBC: 4.47 MIL/uL (ref 3.87–5.11)
WBC: 6 10*3/uL (ref 4.0–10.5)

## 2011-10-25 LAB — COMPREHENSIVE METABOLIC PANEL
AST: 19 U/L (ref 0–37)
Albumin: 4.2 g/dL (ref 3.5–5.2)
Alkaline Phosphatase: 86 U/L (ref 39–117)
BUN: 8 mg/dL (ref 6–23)
Calcium: 9 mg/dL (ref 8.4–10.5)
Chloride: 104 mEq/L (ref 96–112)
Glucose, Bld: 104 mg/dL — ABNORMAL HIGH (ref 70–99)
Potassium: 4.2 mEq/L (ref 3.5–5.3)
Sodium: 141 mEq/L (ref 135–145)
Total Protein: 7 g/dL (ref 6.0–8.3)

## 2011-10-25 LAB — LIPID PANEL
LDL Cholesterol: 91 mg/dL (ref 0–99)
VLDL: 11 mg/dL (ref 0–40)

## 2011-10-25 MED ORDER — LISINOPRIL-HYDROCHLOROTHIAZIDE 20-12.5 MG PO TABS: 1.0000 | ORAL_TABLET | Freq: Every day | ORAL | Status: DC

## 2011-10-25 NOTE — Patient Instructions (Addendum)
Do not restart blood pressure medication until you feel well and able to eat and drink normally.  Come back in 2 weeks for a followup  Continue taking over the counter medications such as pseudophed, dayquil and tylenol until symptoms improve.

## 2011-10-25 NOTE — Telephone Encounter (Signed)
Call from chilonb. Pt is at urg care and does not want to wait she would like to come to clinic, for 2-3 days she has fever, up to 101.5, h/a, achy, cough, general malaise. Will add to empty slot on dr Gilford Rile at [phone]

## 2011-10-25 NOTE — Progress Notes (Signed)
  Subjective:    Patient ID: Crystal English, female    DOB: 10/23/1966, 45 y.o.   MRN: [id number]  HPI  Patient is a 45 year old female with a past medical history listed below, has not been in the clinic for over a year and a half, today she is complaining of flulike symptoms for the past 2 days which consist of fever malaise/myalgia, and productive cough of yellowish sputum, patient is unable to attend work, she would also like to be restarted on her home medications. No other complaints  Patient Active Problem List  Diagnoses  . NEVUS  . DEPRESSION, SITUATIONAL  . SLEEP APNEA, OBSTRUCTIVE, MODERATE  . HYPERTENSION  . ACUTE BRONCHITIS  . INFLUENZA  . ASTHMA  . ESOPHAGEAL REFLUX  . MENORRHAGIA  . GLUCOSE TLRNC ABNRM, MTHR CMPLG PRG, UNSPC  . SKIN TAG  . SHOULDER PAIN, BILATERAL  . FATIGUE, CHRONIC  . HEADACHE  . CHEST PAIN   Current Outpatient Prescriptions on File Prior to Visit  Medication Sig Dispense Refill  . hydrochlorothiazide 25 MG tablet Take 1 tablet (25 mg total) by mouth daily.  30 tablet  3  . lisinopril (PRINIVIL,ZESTRIL) 30 MG tablet Take 1 tablet (30 mg total) by mouth daily.  30 tablet  3   Not on File   Review of Systems  Constitutional: Positive for fever, chills, appetite change and fatigue.  Respiratory: Positive for cough. Negative for shortness of breath, wheezing and stridor.   All other systems reviewed and are negative.       Objective:   Physical Exam  Nursing note and vitals reviewed. Constitutional: She is oriented to person, place, and time. She appears well-developed and well-nourished.  HENT:  Head: Normocephalic and atraumatic.  Eyes: Pupils are equal, round, and reactive to light.  Neck: Normal range of motion. Neck supple. No JVD present. No thyromegaly present.  Cardiovascular: Normal rate, regular rhythm and normal heart sounds.   No murmur heard. Pulmonary/Chest: Effort normal. No respiratory distress. She has no wheezes.  She has rales. She exhibits no tenderness.  Abdominal: Soft. Bowel sounds are normal.  Musculoskeletal: Normal range of motion. She exhibits no edema.  Neurological: She is alert and oriented to person, place, and time.  Skin: Skin is warm and dry.          Assessment & Plan:

## 2011-10-25 NOTE — Assessment & Plan Note (Signed)
Given the patient has not received any care for the past 2 years, has not received a flu shot recently, and her clinical presentation are consistent with flu. For now will recommend supportive care only I will provide antibiotics for now unless her symptoms fail to improve, today we'll check routine labs along with a chest x-ray to ensure there is no progression to pneumonia

## 2011-11-06 ENCOUNTER — Encounter: Payer: Self-pay | Admitting: Internal Medicine

## 2011-11-09 ENCOUNTER — Encounter: Payer: Self-pay | Admitting: Internal Medicine

## 2011-11-17 ENCOUNTER — Telehealth: Payer: Self-pay | Admitting: *Deleted

## 2011-11-17 NOTE — Telephone Encounter (Signed)
Call from pt at 4:30 pm stated she was having issues with her arms.  Some numbness and tingling.  Pt said that she is now concerned since her sister had a heart attack.  Pt also said that she is restarting her B/P medication as ordered by her physician.  Pt was advised to go to Urgent care or the ER if her problems worsen and to call on Wednesday for a follow up appointment.  Angelina Ok , RN 11/17/2011 5:05 pm.

## 2011-11-21 NOTE — Telephone Encounter (Signed)
Agree with plan 

## 2011-11-29 ENCOUNTER — Encounter: Payer: Self-pay | Admitting: Internal Medicine

## 2011-11-29 ENCOUNTER — Ambulatory Visit (INDEPENDENT_AMBULATORY_CARE_PROVIDER_SITE_OTHER): Payer: Self-pay | Admitting: Internal Medicine

## 2011-11-29 DIAGNOSIS — R05 Cough: Secondary | ICD-10-CM

## 2011-11-29 DIAGNOSIS — I10 Essential (primary) hypertension: Secondary | ICD-10-CM

## 2011-11-29 DIAGNOSIS — R058 Other specified cough: Secondary | ICD-10-CM

## 2011-11-29 DIAGNOSIS — R059 Cough, unspecified: Secondary | ICD-10-CM

## 2011-11-29 NOTE — Assessment & Plan Note (Signed)
Lab Results  Component Value Date   NA 141 10/25/2011   K 4.2 10/25/2011   CL 104 10/25/2011   CO2 28 10/25/2011   BUN 8 10/25/2011   CREATININE 0.95 10/25/2011   CREATININE 0.87 04/26/2010    BP Readings from Last 3 Encounters:  11/29/11 130/87  09/21/10 146/106  04/26/10 136/94    Assessment: Hypertension control:  controlled  Progress toward goals:  at goal Barriers to meeting goals:  no barriers identified  Plan: Hypertension treatment:  continue current medications

## 2011-11-29 NOTE — Assessment & Plan Note (Signed)
Symptoms resolved. Labs and chest x-ray discussed with patient.

## 2011-11-29 NOTE — Patient Instructions (Signed)
Make followup appointment in 5-6 months. Take all your medications regularly.  All your lab tests and chest x-ray are within normal limits.  Try to exercise regularly and don't use extra salt in your diet- which will likely help your blood pressure.

## 2011-11-29 NOTE — Progress Notes (Signed)
  Subjective:    Patient ID: Crystal English, female    DOB: [birth year], 46 y.o.   MRN: [id number]  HPI Crystal English is a pleasant 46 year old woman with past history of asthma, menorrhagia, hypertension who comes the clinic for followup visit.  She was seen 2 weeks before by Dr. Gilford Rile and was diagnosed with flu. She is feeling much better today does not complain of any cough or body aches or runny nose.  She wants to know her lab results and chest x-ray which were done during last visit I discussed the lab results and showed her the chest x-ray which were all within normal limits.  She was relieved and happy after getting the information. She was worried as her sister had heart attack a month before.  Patient denies any fever, chills, nausea vomiting, chest pain, short of breath, abdominal pain, diarrhea, urinary abnormalities.  Review of Systems    as per history of present illness, all other systems reviewed and negative. Objective:   Physical Exam  General: resting in bed HEENT: PERRL, EOMI, no scleral icterus Cardiac: S1,S2, RRR, no rubs, murmurs or gallops Pulm: clear to auscultation bilaterally, moving normal volumes of air Abd: soft, nontender, nondistended, BS present Ext: warm and well perfused, no pedal edema Neuro: alert and oriented X3, cranial nerves II-XII grossly intact       Assessment & Plan:

## 2012-04-09 ENCOUNTER — Encounter: Payer: Self-pay | Admitting: Internal Medicine

## 2012-04-09 ENCOUNTER — Ambulatory Visit (INDEPENDENT_AMBULATORY_CARE_PROVIDER_SITE_OTHER): Payer: BC Managed Care – PPO | Admitting: Internal Medicine

## 2012-04-09 VITALS — BP 157/106 | HR 94 | Temp 97.9°F | Ht 61.0 in | Wt 201.8 lb

## 2012-04-09 DIAGNOSIS — N898 Other specified noninflammatory disorders of vagina: Secondary | ICD-10-CM

## 2012-04-09 DIAGNOSIS — B370 Candidal stomatitis: Secondary | ICD-10-CM | POA: Insufficient documentation

## 2012-04-09 DIAGNOSIS — N899 Noninflammatory disorder of vagina, unspecified: Secondary | ICD-10-CM

## 2012-04-09 DIAGNOSIS — I10 Essential (primary) hypertension: Secondary | ICD-10-CM

## 2012-04-09 LAB — HIV ANTIBODY (ROUTINE TESTING W REFLEX): HIV: NONREACTIVE

## 2012-04-09 MED ORDER — FLUCONAZOLE 150 MG PO TABS: 150.0000 mg | ORAL_TABLET | Freq: Once | ORAL | Status: DC

## 2012-04-09 MED ORDER — NYSTATIN PO POWD: ORAL | Status: DC

## 2012-04-09 MED ORDER — LISINOPRIL-HYDROCHLOROTHIAZIDE 20-12.5 MG PO TABS: 1.0000 | ORAL_TABLET | Freq: Every day | ORAL | Status: DC

## 2012-04-09 MED ORDER — NYSTATIN 100000 UNIT/ML MT SUSP: 500000.0000 [IU] | Freq: Four times a day (QID) | OROMUCOSAL | Status: AC

## 2012-04-09 NOTE — Assessment & Plan Note (Signed)
Lab Results  Component Value Date   NA 141 10/25/2011   K 4.2 10/25/2011   CL 104 10/25/2011   CO2 28 10/25/2011   BUN 8 10/25/2011   CREATININE 0.95 10/25/2011   CREATININE 0.87 04/26/2010    BP Readings from Last 3 Encounters:  04/09/12 157/106  11/29/11 130/87  09/21/10 146/106    Assessment: Hypertension control:  mildly elevated  Progress toward goals:  deteriorated Barriers to meeting goals:  nonadherence to medications  Plan: Hypertension treatment:  Restart Lisinopril-HCTZ Check BMET today

## 2012-04-09 NOTE — Progress Notes (Signed)
Subjective:     Patient ID: Crystal English, female   DOB: 03/18/1966, 46 y.o.   MRN: [id number]  HPI Crystal English is a 46 year old woman with history of HTN presents to the clinic for general check-up.   1.) HTN - ran out of meds x1 month. Needs refill. Feels like she gets headaches and thinks it's related to high blood pressure.   2.) Vaginal irritation - found boyfriend cheating on her and would like pap with GC testing and wet prep. Complains of vaginal itching. Denies discharge, pain, dysuria, urinary frequency.   3.) Sore throat - complains of sore throat when swallowing.   Patient has no other complaints or concerns today. She denies chest pain, cough, sob, headache, N/V, changes in abdominal and urinary character.   Review of Systems  All other systems reviewed and are negative.       Objective:   Physical Exam  Constitutional: She appears well-developed.  HENT:  Head: Normocephalic and atraumatic.  Mouth/Throat: No oropharyngeal exudate.       Thrush present on tongue  Neck: Normal range of motion. Neck supple.  Cardiovascular: Normal rate and regular rhythm.   Pulmonary/Chest: Effort normal and breath sounds normal.  Abdominal: Soft.  Genitourinary: Uterus normal. Vaginal discharge found.       Vaginal discharge white and thick   Lymphadenopathy:    She has no cervical adenopathy.

## 2012-04-09 NOTE — Assessment & Plan Note (Signed)
Discharge, vaginal pruritis consistent with vaginal candida infection. Also checked for trich, BV, GC, HIV.  Prescribe diflucan for now until wet prep results are back.

## 2012-04-09 NOTE — Patient Instructions (Signed)
Please take Diflucan pill once. Please use Nystatin powder as directed for thrush Please take Lisinopril-HCTZ medication for your blood pressure. Please follow up in 3 months

## 2012-04-09 NOTE — Assessment & Plan Note (Signed)
Patient has sore throat which may be secondary to oral thrush. Unsure how patient has thrush as she is not immunocompromised or taking steroid inhalers. Will prescribe oral nystatin, and if symptoms do not improve, may need longer course of oral fluconazole vs further work up such as EGD.

## 2012-04-09 NOTE — Progress Notes (Signed)
Addended by: Melida Quitter on: 04/09/2012 01:46 PM   Modules accepted: Orders

## 2012-04-10 ENCOUNTER — Other Ambulatory Visit: Payer: Self-pay | Admitting: Internal Medicine

## 2012-04-10 DIAGNOSIS — N898 Other specified noninflammatory disorders of vagina: Secondary | ICD-10-CM

## 2012-04-10 LAB — BASIC METABOLIC PANEL
BUN: 11 mg/dL (ref 6–23)
Creat: 0.86 mg/dL (ref 0.50–1.10)

## 2012-04-10 MED ORDER — METRONIDAZOLE 500 MG PO TABS: ORAL_TABLET | ORAL | Status: DC

## 2012-04-10 NOTE — Progress Notes (Signed)
Telephone # on chart is not the correct#. I called [facility] on [address] then [address] called [phone] and left a message To call the clinic if this is her correct #.

## 2012-04-10 NOTE — Assessment & Plan Note (Signed)
Trichomonas positive. Tried calling patient to inform her of results but it was wrong number listed. Cannot get a hold of patient, but will ask front desk to contact patient to pick up prescription for Flagyl 4 tabs to be taken at one time.

## 2012-04-12 ENCOUNTER — Encounter: Payer: Self-pay | Admitting: *Deleted

## 2012-04-12 NOTE — Progress Notes (Signed)
Called pt again at the work #, no success. Her pharmacy has the same #'s. I will mail her a letter - to call the clinic.

## 2012-05-09 ENCOUNTER — Telehealth: Payer: Self-pay | Admitting: *Deleted

## 2012-05-09 NOTE — Telephone Encounter (Signed)
Has external vag itching. - offered appt - prefers not to come in at this time. Suggest Monistat cream or cortisone 10 external itching. To call for appt if no change over weekend. Stanton Kidney Marajade Lei RN 05/09/12 10AM

## 2012-05-28 ENCOUNTER — Telehealth: Payer: Self-pay | Admitting: *Deleted

## 2012-05-28 NOTE — Telephone Encounter (Signed)
Call from pt. States she just received the letter I mailed to her in May requesting her to call per Dr Baltazar Apo. Wet Prep positive for  Trich and med had been sent to her pharmacy, which I explained to her.  States she could not remember if she had taken the Flagyl  Which was prescribed; I told to call her pharmacy.  She wanted to know if she should take it anyway (if she did not).  Also states she does not have any symptoms. I talked to Dr Meredith Pel; He said Ivery Quale does not go away on it own and if the pharmacy will fill the rx , she should take the Flagyl and if she  Feels like she needs an appt to be re-eval , she can schedule one. Pt was called and made awared.  States she will call her pharmacy. She does have an appt in Aug but awared she can schedule one sooner.

## 2012-06-14 ENCOUNTER — Encounter: Payer: BC Managed Care – PPO | Admitting: Internal Medicine

## 2012-06-17 ENCOUNTER — Encounter: Payer: BC Managed Care – PPO | Admitting: Internal Medicine

## 2012-06-19 ENCOUNTER — Telehealth: Payer: Self-pay | Admitting: *Deleted

## 2012-06-19 NOTE — Telephone Encounter (Signed)
Pt calls and states she does not know if she ever got her medicine that was prescribed at last appt but she is having the same problem, states she has called and spoke to people but they confused her and she needs to be rid of the problem, i called the pharm and found that she did get the med but it was a month later. She states she was not told anything but to take the med. If she has the same problem then she needs to be told about protecting self from this problem. appt is given as she requests w/ dr Milbert Coulter.

## 2012-06-24 ENCOUNTER — Ambulatory Visit: Payer: BC Managed Care – PPO | Admitting: Internal Medicine

## 2012-07-09 ENCOUNTER — Ambulatory Visit (INDEPENDENT_AMBULATORY_CARE_PROVIDER_SITE_OTHER): Payer: BC Managed Care – PPO | Admitting: Internal Medicine

## 2012-07-09 ENCOUNTER — Encounter: Payer: Self-pay | Admitting: Internal Medicine

## 2012-07-09 VITALS — BP 140/90 | HR 94 | Temp 97.0°F | Ht 61.0 in | Wt 197.0 lb

## 2012-07-09 DIAGNOSIS — J45909 Unspecified asthma, uncomplicated: Secondary | ICD-10-CM

## 2012-07-09 DIAGNOSIS — I10 Essential (primary) hypertension: Secondary | ICD-10-CM

## 2012-07-09 DIAGNOSIS — N898 Other specified noninflammatory disorders of vagina: Secondary | ICD-10-CM

## 2012-07-09 DIAGNOSIS — N899 Noninflammatory disorder of vagina, unspecified: Secondary | ICD-10-CM

## 2012-07-09 MED ORDER — FLUCONAZOLE 150 MG PO TABS: 150.0000 mg | ORAL_TABLET | Freq: Once | ORAL | Status: AC

## 2012-07-09 MED ORDER — METRONIDAZOLE 500 MG PO TABS: ORAL_TABLET | ORAL | Status: AC

## 2012-07-09 NOTE — Progress Notes (Signed)
Patient ID: Crystal English, female   DOB: May 31, 1966, 46 y.o.   MRN: [id number]  Subjective:   Patient ID: Crystal English female   DOB: [birth year] 46 y.o.   MRN: [id number]  HPI: [name] is a 46 y.o. with past medical history as outlined below, who presents for an acute visit.  1.) Vaginal irritation - patient reports having vaginal discharge in the past 2 weeks. The discharge is white, thick, odorless and in small amount. There is no itchiness or abdominal pain. Patient was treated with Diflucan and metronidazole for candidiasis and trichomoniasis in May, 2013. She had positive wet prep tests for trichomonas and candida in May and negative tests for HIV and GC/chalamydia. Her symptoms disappeared after treatment. She reports having had sexual relationship with her previous boyfriend after she completed her treatment.   2.) HTN - ran out of meds x 10 days. She said that she will get her refill on 07/12/12. Her blood pressure is 140/90 today.   3.) Asthma:  Currently she is not taking any medications. She does not have cough, chest pain, wheezing, or shortness of breath.  Denies fever, chills, fatigue, headaches,  diarrhea, constipation, dysuria, urgency, frequency, hematuria, joint pain or leg swelling.   Past Medical History  Diagnosis Date  . Tobacco abuse    Current Outpatient Prescriptions  Medication Sig Dispense Refill  . fluconazole (DIFLUCAN) 150 MG tablet Take 1 tablet (150 mg total) by mouth once.  1 tablet  1  . lisinopril-hydrochlorothiazide (PRINZIDE,ZESTORETIC) 20-12.5 MG per tablet Take 1 tablet by mouth daily.  30 tablet  11  . metroNIDAZOLE (FLAGYL) 500 MG tablet Take 4 tablets at one time  4 tablet  0   No family history on file. History   Social History  . Marital Status: Single    Spouse Name: N/A    Number of Children: N/A  . Years of Education: N/A   Social History Main Topics  . Smoking status: Current Everyday Smoker -- 0.3  packs/day    Types: Cigarettes  . Smokeless tobacco: Never Used  . Alcohol Use: Yes     socially  . Drug Use: No  . Sexually Active: None   Other Topics Concern  . None   Social History Narrative  . None   Review of Systems:  All other systems reviewed and are negative.  Objective:  Physical Exam: Filed Vitals:   07/09/12 1558  BP: 140/90  Pulse: 94  Temp: 97 F (36.1 C)  TempSrc: Oral  Height: 5\' 1"  (1.549 m)  Weight: 197 lb (89.359 kg)  SpO2: 98%   General: resting in bed, not in acute distress HEENT: PERRL, EOMI, no scleral icterus Cardiac: S1/S2, RRR, No murmurs, gallops or rubs Pulm: Good air movement bilaterally, Clear to auscultation bilaterally, No rales, wheezing, rhonchi or rubs. Abd: Soft,  nondistended, nontender, no rebound pain, no organomegaly, BS present Ext: No rashes or edema, 2+DP/PT pulse bilaterally Musculoskeletal: No joint deformities, erythema, or stiffness, ROM full and nontender. Genitourinary: patient refused pelvic examination today. Skin: no rashes. No skin bruise. Neuro: alert and oriented X3, cranial nerves II-XII grossly intact, muscle strength 5/5 in all extremeties,  sensation to light touch intact.    Assessment & Plan:

## 2012-07-09 NOTE — Assessment & Plan Note (Signed)
Patient's blood pressure is elevated today. Her blood pressure is 140/90. It is most likely due to noncompliance to her medications. Patient said she will get her medication refill at 07/12/12. We'll followup

## 2012-07-09 NOTE — Assessment & Plan Note (Signed)
Patient carries the diagnosis of asthma, but she is not on any medications currently. She is asymptomatic. There is no signs of acute exacerbation. We'll followup.

## 2012-07-09 NOTE — Patient Instructions (Signed)
1. I will treat you empirically for yeast and trichomonas infection today. If your symptoms do not improve, please come back to clinic, then we need to do a pelvic examination and lab test at that time. If you still have sexual relationship with the same partner, your partner needs to be treated too. 2. Please take all medications as prescribed.  3. If you have worsening of your symptoms or new symptoms arise, please call the clinic ([phone]), or go to the ER immediately if symptoms are severe.    Trichomoniasis Trichomoniasis is an infection, caused by the Trichomonas organism, that affects both women and men. In women, the outer female genitalia and the vagina are affected. In men, the penis is mainly affected, but the prostate and other reproductive organs can also be involved. Trichomoniasis is a sexually transmitted disease (STD) and is most often passed to another person through sexual contact. The majority of people who get trichomoniasis do so from a sexual encounter and are also at risk for other STDs. CAUSES   Sexual intercourse with an infected partner.   It can be present in swimming pools or hot tubs.  SYMPTOMS   Abnormal gray-green frothy vaginal discharge in women.   Vaginal itching and irritation in women.   Itching and irritation of the area outside the vagina in women.   Penile discharge with or without pain in males.   Inflammation of the urethra (urethritis), causing painful urination.   Bleeding after sexual intercourse.  RELATED COMPLICATIONS  Pelvic inflammatory disease.   Infection of the uterus (endometritis).   Infertility.   Tubal (ectopic) pregnancy.   It can be associated with other STDs, including gonorrhea and chlamydia, hepatitis B, and HIV.  COMPLICATIONS DURING PREGNANCY  Early (premature) delivery.   Premature rupture of the membranes (PROM).   Low birth weight.  DIAGNOSIS   Visualization of Trichomonas under the microscope from the  vagina discharge.   Ph of the vagina greater than 4.5, tested with a test tape.   Trich Rapid Test.   Culture of the organism, but this is not usually needed.   It may be found on a Pap test.   Having a "strawberry cervix,"which means the cervix looks very red like a strawberry.  TREATMENT   You may be given medication to fight the infection. Inform your caregiver if you could be or are pregnant. Some medications used to treat the infection should not be taken during pregnancy.   Over-the-counter medications or creams to decrease itching or irritation may be recommended.   Your sexual partner will need to be treated if infected.  HOME CARE INSTRUCTIONS   Take all medication prescribed by your caregiver.   Take over-the-counter medication for itching or irritation as directed by your caregiver.   Do not have sexual intercourse while you have the infection.   Do not douche or wear tampons.   Discuss your infection with your partner, as your partner may have acquired the infection from you. Or, your partner may have been the person who transmitted the infection to you.   Have your sex partner examined and treated if necessary.   Practice safe, informed, and protected sex.   See your caregiver for other STD testing.  SEEK MEDICAL CARE IF:   You still have symptoms after you finish the medication.   You have an oral temperature above 102 F (38.9 C).   You develop belly (abdominal) pain.   You have pain when you urinate.  You have bleeding after sexual intercourse.   You develop a rash.   The medication makes you sick or makes you throw up (vomit).  Document Released: 05/02/2001 Document Revised: 10/26/2011 Document Reviewed: 05/28/2009 [facility] Patient Information 2012 [place], [place].

## 2012-07-09 NOTE — Assessment & Plan Note (Signed)
Patient's symptoms is consistent with yeast infection (candidiasis). She had sexual relationship with her previous boyfriend after she completed her treatment for trichomoniasis in May, this would increase the risk of Trichomonas infection. Patient refused pelvic examination today, we'll treat her empirically with Diflucan for candidiasis and metronidazole for trichomoniasis. She was instructed to come back if her symptoms does not improve. If she does not response to empiric treatment, she will need to have pelvic examination, wet prep and tests for sexually transmitted diseases in next visit.

## 2012-10-06 ENCOUNTER — Inpatient Hospital Stay (HOSPITAL_COMMUNITY)
Admission: AD | Admit: 2012-10-06 | Discharge: 2012-10-07 | Disposition: A | Discharge status: Home / Self Care | Payer: BC Managed Care – PPO | Source: Ambulatory Visit | Attending: Emergency Medicine | Admitting: Emergency Medicine

## 2012-10-06 ENCOUNTER — Other Ambulatory Visit: Payer: Self-pay

## 2012-10-06 ENCOUNTER — Inpatient Hospital Stay (HOSPITAL_COMMUNITY): Payer: BC Managed Care – PPO

## 2012-10-06 ENCOUNTER — Encounter (HOSPITAL_COMMUNITY): Payer: Self-pay | Admitting: *Deleted

## 2012-10-06 DIAGNOSIS — R0602 Shortness of breath: Secondary | ICD-10-CM

## 2012-10-06 DIAGNOSIS — R062 Wheezing: Secondary | ICD-10-CM | POA: Insufficient documentation

## 2012-10-06 DIAGNOSIS — J45909 Unspecified asthma, uncomplicated: Secondary | ICD-10-CM | POA: Insufficient documentation

## 2012-10-06 DIAGNOSIS — F17200 Nicotine dependence, unspecified, uncomplicated: Secondary | ICD-10-CM | POA: Insufficient documentation

## 2012-10-06 DIAGNOSIS — R0789 Other chest pain: Secondary | ICD-10-CM | POA: Insufficient documentation

## 2012-10-06 DIAGNOSIS — Z79899 Other long term (current) drug therapy: Secondary | ICD-10-CM | POA: Insufficient documentation

## 2012-10-06 DIAGNOSIS — I10 Essential (primary) hypertension: Secondary | ICD-10-CM | POA: Insufficient documentation

## 2012-10-06 DIAGNOSIS — R059 Cough, unspecified: Secondary | ICD-10-CM | POA: Insufficient documentation

## 2012-10-06 DIAGNOSIS — J069 Acute upper respiratory infection, unspecified: Secondary | ICD-10-CM | POA: Insufficient documentation

## 2012-10-06 DIAGNOSIS — R05 Cough: Secondary | ICD-10-CM | POA: Insufficient documentation

## 2012-10-06 HISTORY — DX: Unspecified asthma, uncomplicated: J45.909

## 2012-10-06 HISTORY — DX: Essential (primary) hypertension: I10

## 2012-10-06 MED ORDER — ACETAMINOPHEN 500 MG PO TABS
1000.0000 mg | ORAL_TABLET | ORAL | Status: AC
  Administered 2012-10-06: 1000 mg via ORAL
  Filled 2012-10-06: qty 2

## 2012-10-06 MED ORDER — LISINOPRIL-HYDROCHLOROTHIAZIDE 20-12.5 MG PO TABS: 1.0000 | ORAL_TABLET | Freq: Every day | ORAL | Status: DC

## 2012-10-06 MED ORDER — ALBUTEROL SULFATE (5 MG/ML) 0.5% IN NEBU
INHALATION_SOLUTION | RESPIRATORY_TRACT | Status: AC
  Administered 2012-10-06: 5 mg via RESPIRATORY_TRACT
  Filled 2012-10-06: qty 0.5

## 2012-10-06 MED ORDER — ALBUTEROL SULFATE (5 MG/ML) 0.5% IN NEBU
INHALATION_SOLUTION | RESPIRATORY_TRACT | Status: DC
Start: 2012-10-06 — End: 2012-10-07
  Filled 2012-10-06: qty 1

## 2012-10-06 MED ORDER — ALBUTEROL SULFATE (5 MG/ML) 0.5% IN NEBU
5.0000 mg | INHALATION_SOLUTION | Freq: Once | RESPIRATORY_TRACT | Status: AC
  Administered 2012-10-06: 5 mg via RESPIRATORY_TRACT
  Filled 2012-10-06: qty 1

## 2012-10-06 MED ORDER — LISINOPRIL 20 MG PO TABS
20.0000 mg | ORAL_TABLET | Freq: Every day | ORAL | Status: DC
  Administered 2012-10-06: 20 mg via ORAL
  Filled 2012-10-06: qty 1

## 2012-10-06 MED ORDER — ALBUTEROL (5 MG/ML) CONTINUOUS INHALATION SOLN
10.0000 mg/h | INHALATION_SOLUTION | Freq: Once | RESPIRATORY_TRACT | Status: AC
  Administered 2012-10-06: 5 mg via RESPIRATORY_TRACT

## 2012-10-06 MED ORDER — SODIUM CHLORIDE 0.9 % IJ SOLN
INTRAMUSCULAR | Status: AC
  Filled 2012-10-06: qty 3

## 2012-10-06 MED ORDER — METHYLPREDNISOLONE SODIUM SUCC 125 MG IJ SOLR
125.0000 mg | Freq: Once | INTRAMUSCULAR | Status: AC
  Administered 2012-10-06: 125 mg via INTRAVENOUS
  Filled 2012-10-06: qty 2

## 2012-10-06 MED ORDER — ALBUTEROL SULFATE (5 MG/ML) 0.5% IN NEBU
INHALATION_SOLUTION | RESPIRATORY_TRACT | Status: AC
  Filled 2012-10-06: qty 0.5

## 2012-10-06 MED ORDER — ALBUTEROL SULFATE HFA 108 (90 BASE) MCG/ACT IN AERS
2.0000 | INHALATION_SPRAY | Freq: Once | RESPIRATORY_TRACT | Status: AC
  Administered 2012-10-06: 2 via RESPIRATORY_TRACT
  Filled 2012-10-06: qty 6.7

## 2012-10-06 MED ORDER — IPRATROPIUM BROMIDE 0.02 % IN SOLN
RESPIRATORY_TRACT | Status: AC
  Administered 2012-10-06: 0.5 mg
  Filled 2012-10-06: qty 2.5

## 2012-10-06 MED ORDER — AZITHROMYCIN 250 MG PO TABS
500.0000 mg | ORAL_TABLET | Freq: Once | ORAL | Status: AC
  Administered 2012-10-06: 500 mg via ORAL
  Filled 2012-10-06: qty 2

## 2012-10-06 MED ORDER — AZITHROMYCIN 250 MG PO TABS: ORAL_TABLET | ORAL | Status: DC

## 2012-10-06 MED ORDER — PREDNISONE 10 MG PO TABS: ORAL_TABLET | ORAL | Status: DC

## 2012-10-06 MED ORDER — HYDROCHLOROTHIAZIDE 12.5 MG PO CAPS
12.5000 mg | ORAL_CAPSULE | Freq: Every day | ORAL | Status: DC
  Administered 2012-10-06: 12.5 mg via ORAL
  Filled 2012-10-06: qty 1

## 2012-10-06 MED ORDER — LACTATED RINGERS IV SOLN: INTRAVENOUS | Status: DC

## 2012-10-06 NOTE — ED Provider Notes (Signed)
History     CSN: [id number]  Arrival date & time 10/06/12  7829   First MD Initiated Contact with Patient 10/06/12 1959      Chief Complaint  Patient presents with  . Shortness of Breath    (Consider location/radiation/quality/duration/timing/severity/associated sxs/prior treatment) HPI Crystal English is a 46 y.o. female who was a transfer from [facility] with complaint of uri symptoms, cough, shortness of breath. Pt states she has been having nasal congestion, cough for about a week. Today felt worse, [facility] states felt like she could not get any air and so she went to their ER. There she was found to by hypoxic with sats in upper 80s. She was given a neb treatment, placed on oxygen with sats coming up to mid 90s. Pt was also found to have a BP of 173/120. Pt transferred here for further evaluation. Pt denies any cardiac problems in the past. She has not taken her BP medications in 2 days. She has been using albuterol at home and musinex for cold symptoms.   Past Medical History  Diagnosis Date  . Tobacco abuse   . Asthma   . Hypertension   . Headache     Past Surgical History  Procedure Date  . Laparotomy     for tubal pregnancy  . Tubal ligation     History reviewed. No pertinent family history.  History  Substance Use Topics  . Smoking status: Current Every Day Smoker -- 0.3 packs/day    Types: Cigarettes  . Smokeless tobacco: Never Used  . Alcohol Use: Yes     Comment: socially    OB History    Grav Para Term Preterm Abortions TAB SAB Ect Mult Living                  Review of Systems  Constitutional: Positive for chills. Negative for fever.  HENT: Positive for congestion. Negative for neck pain and neck stiffness.   Respiratory: Positive for cough, chest tightness, shortness of breath and wheezing. Negative for stridor.   Cardiovascular: Negative for chest pain and leg swelling.  Neurological: Negative for  dizziness, weakness and headaches.  All other systems reviewed and are negative.    Allergies  Review of patient's allergies indicates no known allergies.  Home Medications   Current Outpatient Rx  Name  Route  Sig  Dispense  Refill  . ALBUTEROL SULFATE HFA 108 (90 BASE) MCG/ACT IN AERS   Inhalation   Inhale 2 puffs into the lungs every 6 (six) hours as needed. SOB/Wheezing         . GUAIFENESIN ER 600 MG PO TB12   Oral   Take 1,200 mg by mouth 2 (two) times daily.         Marland Kitchen LISINOPRIL-HYDROCHLOROTHIAZIDE 20-12.5 MG PO TABS   Oral   Take 1 tablet by mouth daily.   30 tablet   11     BP 146/95  Pulse 98  Temp 97.1 F (36.2 C) (Oral)  Resp 20  SpO2 95%  Physical Exam  Nursing note and vitals reviewed. Constitutional: She is oriented to person, place, and time. She appears well-developed and well-nourished. No distress.  HENT:  Head: Normocephalic and atraumatic.  Right Ear: External ear normal.  Left Ear: External ear normal.  Nose: Nose normal.  Mouth/Throat: Oropharynx is clear and moist.  Cardiovascular: Normal rate, regular rhythm and normal heart sounds.   Pulmonary/Chest: Effort normal. No respiratory distress. She  has wheezes. She has rales.       Inspiratory and expiratory wheezes bilaterally  Abdominal: Soft. Bowel sounds are normal. She exhibits no distension. There is no tenderness. There is no rebound.  Musculoskeletal: She exhibits no edema.  Neurological: She is alert and oriented to person, place, and time.  Skin: Skin is warm and dry.  Psychiatric: She has a normal mood and affect.    ED Course  Procedures (including critical care time)  Pt with wheezing, shortness of breath, cold symptoms, cough. Pt received neb so far, will add steroids, another neb, CXR, will treat her BP with her regular BP meds. Pt has no chest pain, no signs of end organ damage.   Labs Reviewed - No data to display Dg Chest 2 View  10/06/2012  *RADIOLOGY REPORT*   Clinical Data: Shortness of breath, cough, congestion, history bronchitis and asthma, smoker  CHEST - 2 VIEW  Comparison: 10/25/2011  Findings: Upper-normal size of cardiac silhouette. Mediastinal contours and pulmonary vascularity normal. Mild chronic peribronchial thickening. No acute infiltrate, pleural effusion or pneumothorax. Bones unremarkable.  IMPRESSION: Mild chronic bronchitic changes.   Original Report Authenticated By: Ulyses Southward, M.D.     11:26 PM Pt reassessed. oxgyen level now between 95-100% on RA. She is still wheezing, but feels much improved.    1. SOB (shortness of breath)   2. HYPERTENSION   3. URI (upper respiratory infection)   4. Hypertension       MDM  PT with SOB, URI symptoms for 1 week, worsened today. Hx of asthma and COPD. She is still a smoker. Pt received nebs in ED, steroids IV. Her symptoms improved. She will be started on prednisone at home, albuterol, zpack. She was ambulated in hallway with oxygen staying about 90% on RA. She did not get SOB walking. Her HR is normal, she is not having any chest pain, Doubt PE. Her BP came down with fluids and her regular BP medications. Last bp is 146/96  Filed Vitals:   10/06/12 2245  BP: 146/95  Pulse: 98  Temp:   Resp:    Pt is stable for Dc home.        Lottie Mussel, PA 10/06/12 2328

## 2012-10-06 NOTE — ED Provider Notes (Signed)
Medical screening examination/treatment/procedure(s) were performed by non-physician practitioner and as supervising physician I was immediately available for consultation/collaboration.   Gavin Pound. Oletta Lamas, MD 10/06/12 [phone]

## 2012-10-06 NOTE — Progress Notes (Signed)
Pt was admitted into [facility] system in error.  The patient, Crystal English (MRN: [id number]), EFM tracing is what is recorded in Ms. Chamblin's chart.  The paper strip was labelled correctly and sent to medical records for archiving.

## 2012-10-06 NOTE — ED Notes (Signed)
Pt reports she still feels SOB when she walks.

## 2012-10-06 NOTE — MAU Provider Note (Signed)
Chief Complaint: Shortness of Breath   None    SUBJECTIVE HPI: Crystal English is a 46 y.o.  who presents to maternity admissions reporting SOB with URI x3 days, worsening yesterday.  Today, she reports chest tightness and difficulty taking breaths.  She took a decongestant last night but none today and has taken Mucinex x2 doses today without improvement.  She has chronic hypertension and takes medication but has not taken her medication x2 days.  She has not used her rescue inhaler for asthma in weeks and reports no other medications prescribed for this.  She reports fever/chills/body aches/h/a, and denies vaginal bleeding, vaginal itching/burning, urinary symptoms, dizziness, or n/v.    Past Medical History  Diagnosis Date  . Tobacco abuse   . Asthma   . Hypertension   . Headache    Past Surgical History  Procedure Date  . Laparotomy     for tubal pregnancy  . Tubal ligation    History   Social History  . Marital Status: Single    Spouse Name: N/A    Number of Children: N/A  . Years of Education: N/A   Occupational History  . Not on file.   Social History Main Topics  . Smoking status: Current Every Day Smoker -- 0.3 packs/day    Types: Cigarettes  . Smokeless tobacco: Never Used  . Alcohol Use: Yes     Comment: socially  . Drug Use: No  . Sexually Active: Not on file   Other Topics Concern  . Not on file   Social History Narrative  . No narrative on file   No current facility-administered medications on file prior to encounter.   Current Outpatient Prescriptions on File Prior to Encounter  Medication Sig Dispense Refill  . lisinopril-hydrochlorothiazide (PRINZIDE,ZESTORETIC) 20-12.5 MG per tablet Take 1 tablet by mouth daily.  30 tablet  11   Not on File  ROS: Pertinent items in HPI  OBJECTIVE Blood pressure 166/117, pulse 92, temperature 98.1 F (36.7 C), temperature source Oral, resp. rate 22, SpO2 94.00%. Patient Vitals for the past 24 hrs:  BP  Temp Temp src Pulse Resp SpO2  10/06/12 1955 172/102 mmHg 97.1 F (36.2 C) Oral 101  20  92 %  10/06/12 1930 172/96 mmHg - - - - -  10/06/12 1926 172/96 mmHg - - - - 92 %  10/06/12 1911 166/105 mmHg - - - - 96 %  10/06/12 1847 - - - - - 93 %  10/06/12 1844 190/120 mmHg - - - - -  10/06/12 1832 186/115 mmHg - - - - 95 %  10/06/12 1825 166/117 mmHg - - 92  - -  10/06/12 1812 176/112 mmHg 98.1 F (36.7 C) Oral 99  22  94 %   Physical Examination: General appearance - oriented to person, place, and time, in mild to moderate distress and sitting upright on edge of bed in order to improve breathing Mental status - alert, oriented to person, place, and time, normal mood, behavior, speech, dress, motor activity, and thought processes Chest - no tachypnea, retractions or cyanosis, rales on inspiration and expiration noted bilaterally in all lobes Heart - normal rate, regular rhythm, normal S1, S2, no murmurs, rubs, clicks or gallops Pelvic - examination not indicated Neurological - alert, oriented, normal speech, no focal findings or movement disorder noted, screening mental status exam normal, DTR's normal and symmetric Musculoskeletal - full range of motion without pain Extremities - peripheral pulses normal, no pedal edema, no  clubbing or cyanosis Skin - normal coloration and turgor, no rashes, no suspicious skin lesions noted Pt is diaphoretic  EKG with NSR, no T wave abnormalities    ASSESSMENT 1. SOB (shortness of breath)   2. HYPERTENSION   3. URI (upper respiratory infection)     PLAN Physical assessment, EKG, and breathing treatment done in MAU with improvement in SOB Reviewed with pt recommendation to transfer to [facility] r/t high blood pressure with SOB Dr Oletta Lamas at [facility] accepted pt transfer Pt transferred to [facility] for further cardiac evaluation Labs drawn and sent with pt    Medication List     As of 10/06/2012  6:52 PM    ASK your doctor about these  medications         lisinopril-hydrochlorothiazide 20-12.5 MG per tablet   Commonly known as: PRINZIDE,ZESTORETIC   Take 1 tablet by mouth daily.         Sharen Counter Certified Nurse-Midwife 10/06/2012  6:52 PM

## 2012-10-06 NOTE — ED Notes (Signed)
Per [facility] - pt c/o SOB along with cold and flu symptoms X 1 week. [facility] and put on [place] a 2L/min. Pts O2 sats raised. [facility] noticed [facility] was hypertensive. Pt's only complaint now is a HA. BP 173/120 HR 97 RR 22 O2 95% on 2L/min. [facility] started a 20G in right hand and administered NS KVO. NKA, has hx of asthma and HTN. Pt is A&Ox4.

## 2012-10-06 NOTE — ED Notes (Signed)
Patient transported to X-ray 

## 2012-10-06 NOTE — ED Notes (Signed)
O2 at 2L via [place] started.

## 2012-10-06 NOTE — MAU Note (Signed)
Pt has been having a cold and cough. Started feeling SOB this morning and just got worse

## 2012-10-08 ENCOUNTER — Encounter: Payer: Self-pay | Admitting: Internal Medicine

## 2012-10-08 ENCOUNTER — Other Ambulatory Visit: Payer: Self-pay

## 2012-10-08 ENCOUNTER — Observation Stay (HOSPITAL_COMMUNITY)
Admission: AD | Admit: 2012-10-08 | Discharge: 2012-10-10 | Disposition: A | Discharge status: Home / Self Care | Payer: BC Managed Care – PPO | Source: Ambulatory Visit | Attending: Internal Medicine | Admitting: Internal Medicine

## 2012-10-08 ENCOUNTER — Ambulatory Visit (INDEPENDENT_AMBULATORY_CARE_PROVIDER_SITE_OTHER): Payer: BC Managed Care – PPO | Admitting: Internal Medicine

## 2012-10-08 ENCOUNTER — Observation Stay (HOSPITAL_COMMUNITY): Payer: BC Managed Care – PPO

## 2012-10-08 ENCOUNTER — Encounter (HOSPITAL_COMMUNITY): Payer: Self-pay | Admitting: Internal Medicine

## 2012-10-08 VITALS — BP 159/107 | HR 93 | Temp 97.5°F | Ht 60.0 in | Wt 197.0 lb

## 2012-10-08 DIAGNOSIS — E669 Obesity, unspecified: Secondary | ICD-10-CM

## 2012-10-08 DIAGNOSIS — J45909 Unspecified asthma, uncomplicated: Secondary | ICD-10-CM | POA: Diagnosis present

## 2012-10-08 DIAGNOSIS — I10 Essential (primary) hypertension: Secondary | ICD-10-CM | POA: Diagnosis present

## 2012-10-08 DIAGNOSIS — Z720 Tobacco use: Secondary | ICD-10-CM | POA: Insufficient documentation

## 2012-10-08 DIAGNOSIS — J45901 Unspecified asthma with (acute) exacerbation: Principal | ICD-10-CM | POA: Insufficient documentation

## 2012-10-08 DIAGNOSIS — F17200 Nicotine dependence, unspecified, uncomplicated: Secondary | ICD-10-CM | POA: Insufficient documentation

## 2012-10-08 DIAGNOSIS — J209 Acute bronchitis, unspecified: Secondary | ICD-10-CM

## 2012-10-08 DIAGNOSIS — R7309 Other abnormal glucose: Secondary | ICD-10-CM | POA: Insufficient documentation

## 2012-10-08 DIAGNOSIS — R079 Chest pain, unspecified: Secondary | ICD-10-CM | POA: Insufficient documentation

## 2012-10-08 DIAGNOSIS — J9600 Acute respiratory failure, unspecified whether with hypoxia or hypercapnia: Secondary | ICD-10-CM | POA: Insufficient documentation

## 2012-10-08 DIAGNOSIS — R350 Frequency of micturition: Secondary | ICD-10-CM | POA: Insufficient documentation

## 2012-10-08 HISTORY — DX: Morbid (severe) obesity due to excess calories: E66.01

## 2012-10-08 LAB — COMPREHENSIVE METABOLIC PANEL
ALT: 15 U/L (ref 0–35)
AST: 16 U/L (ref 0–37)
Albumin: 3.8 g/dL (ref 3.5–5.2)
Calcium: 9.8 mg/dL (ref 8.4–10.5)
Sodium: 136 mEq/L (ref 135–145)
Total Protein: 8.1 g/dL (ref 6.0–8.3)

## 2012-10-08 LAB — GLUCOSE, CAPILLARY

## 2012-10-08 LAB — CBC WITH DIFFERENTIAL/PLATELET
Basophils Absolute: 0 10*3/uL (ref 0.0–0.1)
Basophils Relative: 0 % (ref 0–1)
Eosinophils Absolute: 0 10*3/uL (ref 0.0–0.7)
Eosinophils Relative: 0 % (ref 0–5)
Lymphocytes Relative: 9 % — ABNORMAL LOW (ref 12–46)
MCHC: 33.6 g/dL (ref 30.0–36.0)
MCV: 93.5 fL (ref 78.0–100.0)
Platelets: 291 10*3/uL (ref 150–400)
RDW: 15.1 % (ref 11.5–15.5)
WBC: 16 10*3/uL — ABNORMAL HIGH (ref 4.0–10.5)

## 2012-10-08 MED ORDER — ACETAMINOPHEN 325 MG PO TABS
650.0000 mg | ORAL_TABLET | Freq: Four times a day (QID) | ORAL | Status: DC | PRN
  Administered 2012-10-08 – 2012-10-09 (×2): 650 mg via ORAL
  Filled 2012-10-08 (×2): qty 2

## 2012-10-08 MED ORDER — ALBUTEROL SULFATE (5 MG/ML) 0.5% IN NEBU
2.5000 mg | INHALATION_SOLUTION | Freq: Three times a day (TID) | RESPIRATORY_TRACT | Status: AC
  Administered 2012-10-08: 2.5 mg via RESPIRATORY_TRACT
  Filled 2012-10-08 (×2): qty 0.5

## 2012-10-08 MED ORDER — HYDROCHLOROTHIAZIDE 25 MG PO TABS: 12.5000 mg | ORAL_TABLET | Freq: Every day | ORAL | Status: DC

## 2012-10-08 MED ORDER — IPRATROPIUM BROMIDE 0.02 % IN SOLN
0.5000 mg | Freq: Four times a day (QID) | RESPIRATORY_TRACT | Status: DC | PRN
  Administered 2012-10-08: 0.5 mg via RESPIRATORY_TRACT
  Filled 2012-10-08 (×2): qty 2.5

## 2012-10-08 MED ORDER — ENOXAPARIN SODIUM 40 MG/0.4ML ~~LOC~~ SOLN
40.0000 mg | SUBCUTANEOUS | Status: DC
  Administered 2012-10-09: 40 mg via SUBCUTANEOUS
  Filled 2012-10-08 (×3): qty 0.4

## 2012-10-08 MED ORDER — METHYLPREDNISOLONE SODIUM SUCC 125 MG IJ SOLR
60.0000 mg | Freq: Two times a day (BID) | INTRAMUSCULAR | Status: DC
  Administered 2012-10-08 (×2): 60 mg via INTRAVENOUS
  Filled 2012-10-08: qty 0.96
  Filled 2012-10-08: qty 2
  Filled 2012-10-08 (×3): qty 0.96

## 2012-10-08 MED ORDER — HYDROCHLOROTHIAZIDE 25 MG PO TABS
12.5000 mg | ORAL_TABLET | Freq: Every day | ORAL | Status: DC
  Administered 2012-10-09: 12.5 mg via ORAL
  Filled 2012-10-08 (×2): qty 0.5

## 2012-10-08 MED ORDER — BENZONATATE 100 MG PO CAPS
100.0000 mg | ORAL_CAPSULE | Freq: Three times a day (TID) | ORAL | Status: DC | PRN
  Filled 2012-10-08: qty 1

## 2012-10-08 MED ORDER — LISINOPRIL 20 MG PO TABS
20.0000 mg | ORAL_TABLET | Freq: Once | ORAL | Status: AC
  Administered 2012-10-08: 20 mg via ORAL
  Filled 2012-10-08: qty 1

## 2012-10-08 MED ORDER — LISINOPRIL 40 MG PO TABS
40.0000 mg | ORAL_TABLET | Freq: Every day | ORAL | Status: DC
  Administered 2012-10-10: 40 mg via ORAL
  Filled 2012-10-08 (×2): qty 1

## 2012-10-08 MED ORDER — ALBUTEROL SULFATE (2.5 MG/3ML) 0.083% IN NEBU
2.5000 mg | INHALATION_SOLUTION | Freq: Once | RESPIRATORY_TRACT | Status: AC
  Administered 2012-10-08: 2.5 mg via RESPIRATORY_TRACT

## 2012-10-08 MED ORDER — LISINOPRIL-HYDROCHLOROTHIAZIDE 20-12.5 MG PO TABS: 1.0000 | ORAL_TABLET | Freq: Every day | ORAL | Status: DC

## 2012-10-08 MED ORDER — IPRATROPIUM BROMIDE 0.02 % IN SOLN
0.5000 mg | Freq: Once | RESPIRATORY_TRACT | Status: AC
  Administered 2012-10-08: 0.5 mg via RESPIRATORY_TRACT

## 2012-10-08 MED ORDER — ACETAMINOPHEN 650 MG RE SUPP: 650.0000 mg | Freq: Four times a day (QID) | RECTAL | Status: DC | PRN

## 2012-10-08 MED ORDER — AZITHROMYCIN 250 MG PO TABS
250.0000 mg | ORAL_TABLET | Freq: Every day | ORAL | Status: AC
  Administered 2012-10-08 – 2012-10-10 (×3): 250 mg via ORAL
  Filled 2012-10-08 (×3): qty 1

## 2012-10-08 MED ORDER — ALBUTEROL SULFATE HFA 108 (90 BASE) MCG/ACT IN AERS
2.0000 | INHALATION_SPRAY | RESPIRATORY_TRACT | Status: DC | PRN
  Filled 2012-10-08: qty 6.7

## 2012-10-08 MED ORDER — ENOXAPARIN SODIUM 40 MG/0.4ML ~~LOC~~ SOLN
40.0000 mg | SUBCUTANEOUS | Status: DC
  Administered 2012-10-08: 40 mg via SUBCUTANEOUS
  Filled 2012-10-08: qty 0.4

## 2012-10-08 MED ORDER — ALBUTEROL SULFATE (5 MG/ML) 0.5% IN NEBU
2.5000 mg | INHALATION_SOLUTION | Freq: Four times a day (QID) | RESPIRATORY_TRACT | Status: DC
  Administered 2012-10-08: 2.5 mg via RESPIRATORY_TRACT
  Filled 2012-10-08: qty 0.5

## 2012-10-08 NOTE — Progress Notes (Signed)
Subjective:    Patient ID: Crystal English, female    DOB: 24-Mar-1966, 46 y.o.   MRN: [id number]  HPI Comments: 46 y.o PMH OSA, HTN (BP 162/114 with repeat 159/107 ), asthma, tobacco abuse, headache, shortness of breath present for ED f/u for bronchitis noted 11/17 CXR with mild changes.  She was given Albuterol, Azithromycin 250 mg po x 4 tablets, Prednisone taper and Lisinopril-HCTZ.  Her symptoms of an URI started Thursday prior to visit with a sore throat (resolved), cough, sneezing, runny nose/congestion.  Her symptoms of sob and cough persisted causing her to go to the ED 10/06/12.  She was diagnosed with bronchitis.  Her sob is a little better since ED visit but she still has sob with exertion and wheezing (started about 2 days ago), cough at night.  She also has had chest tightness which persists and chest pain which was noted in the lobby awaiting an appt today.  Chest pain is now resolved.  Shince she has been coughing she has musculoskeletal pain in her abdomen.  She has been hospitalized for asthma exacerbation about 3 years ago for 4 days but was not intubated at that time.  Her boyfriend has a puppy at home but she has tolerated being around animals in the past and is not allergic.  She currently smokes 2-3 cigarettes/day since a teenager. She has been taking her steroid taper on 4 pills today, Azithromycin 250 mg tablets with 2 days left, using Albuterol inhaler 2 puffs tid.  She has not been using Mucinex which she started out using before she went to the ED visit.  Peak flows were 120, 210 then 240 and she ambulated with the pulse ox in clinic with saturation 87%.  Initially her O2 sat was 94 % with vitals, HR was 94.    She has had diarrhea x 2 days about 3 loose to formed stools.  Her boyfriend has also had these symptoms.    Obesity: She is using bee pollen as an appetite suppressant but not currently working out at the gym.  She weighs 197 lbs today and used to weight 220 lbs.     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       Hot/cold flashes related to pre menopausal per pt    Respiratory: Positive for cough, chest tightness, shortness of breath and wheezing.        Asthma sxs at night cough, wheezing Cough with white to yellow sputum  Cardiovascular: Negative for chest pain and leg swelling.  Gastrointestinal: Positive for diarrhea. Negative for abdominal pain.  Musculoskeletal:       Musculoskeletal pains from coughing        Objective:   Physical Exam  Nursing note and vitals reviewed. Constitutional: She is oriented to person, place, and time. She appears well-developed and well-nourished. She is cooperative. No distress.       pleasant  HENT:  Head: Normocephalic and atraumatic.  Mouth/Throat: Oropharynx is clear and moist and mucous membranes are normal. No oropharyngeal exudate.  Eyes: Conjunctivae normal are normal. Pupils are equal, round, and reactive to light. Right eye exhibits no discharge. Left eye exhibits no discharge. No scleral icterus.  Cardiovascular: Regular rhythm, S1 normal, S2 normal and normal heart sounds.  Tachycardia present.  No murmur heard. Pulmonary/Chest: No respiratory distress. She has wheezes.       Coughing with deep inspiration No accessory muscle usage   Abdominal: Soft. Bowel sounds are normal. She exhibits no distension. There is no tenderness.       Ab muscles with min ttp   Neurological: She is alert and oriented to person, place, and time. Gait normal.  Skin: Skin is warm, dry and intact. No rash noted. She is not diaphoretic.  Psychiatric: She has a normal mood and affect. Her speech is normal and behavior is normal. Judgment and thought content normal.          Assessment & Plan:  Admit to observation

## 2012-10-08 NOTE — Patient Instructions (Signed)
Headache Headaches are caused by many different problems. Most commonly, headache is caused by muscle tension from an injury, fatigue, or emotional upset. Excessive muscle contractions in the scalp and neck result in a headache that often feels like a tight band around the head. Tension headaches often have areas of tenderness over the scalp and the back of the neck. These headaches may last for hours, days, or longer, and some may contribute to migraines in those who have migraine problems. Migraines usually cause a throbbing headache, which is made worse by activity. Sometimes only one side of the head hurts. Nausea, vomiting, eye pain, and avoidance of food are common with migraines. Visual symptoms such as light sensitivity, blind spots, or flashing lights may also occur. Loud noises may worsen migraine headaches. Many factors may cause migraine headaches:  Emotional stress, lack of sleep, and menstrual periods.   Alcohol and some drugs (such as birth control pills).   Diet factors (fasting, caffeine, food preservatives, chocolate).   Environmental factors (weather changes, bright lights, odors, smoke).  Other causes of headaches include minor injuries to the head. Arthritis in the neck; problems with the jaw, eyes, ears, or nose are also causes of headaches. Allergies, drugs, alcohol, and exposure to smoke can also cause moderate headaches. Rebound headaches can occur if someone uses pain medications for a long period of time and then stops. Less commonly, blood vessel problems in the neck and brain (including stroke) can cause various types of headache. Treatment of headaches includes medicines for pain and relaxation. Ice packs or heat applied to the back of the head and neck help some people. Massaging the shoulders, neck and scalp are often very useful. Relaxation techniques and stretching can help prevent these headaches. Avoid alcohol and cigarette smoking as these tend to make headaches  worse. Please see your caregiver if your headache is not better in 2 days.  SEEK IMMEDIATE MEDICAL CARE IF:   You develop a high fever, chills, or repeated vomiting.   You faint or have difficulty with vision.   You develop unusual numbness or weakness of your arms or legs.   Relief of pain is inadequate with medication, or you develop severe pain.   You develop confusion, or neck stiffness.   You have a worsening of a headache or do not obtain relief.  Document Released: 11/06/2005 Document Revised: 10/26/2011 Document Reviewed: 05/02/2007 [facility] Patient Information 2012 [place], [place].Hypertension Hypertension is another name for high blood pressure. High blood pressure may mean that your heart needs to work harder to pump blood. Blood pressure consists of two numbers, which includes a higher number over a lower number (example: 110/72). HOME CARE   Make lifestyle changes as told by your doctor. This may include weight loss and exercise.  Take your blood pressure medicine every day.  Limit how much salt you use.  Stop smoking if you smoke.  Do not use drugs.  Talk to your doctor if you are using decongestants or birth control pills. These medicines might make blood pressure higher.  Females should not drink more than 1 alcoholic drink per day. Males should not drink more than 2 alcoholic drinks per day.  See your doctor as told. GET HELP RIGHT AWAY IF:   You have a blood pressure reading with a top number of 180 or higher.  You get a very bad headache.  You get blurred or changing vision.  You feel confused.  You feel weak, numb, or faint.  You get  chest or belly (abdominal) pain.  You throw up (vomit).  You cannot breathe very well. MAKE SURE YOU:   Understand these instructions.  Will watch your condition.  Will get help right away if you are not doing well or get worse. Document Released: 04/24/2008 Document Revised: 01/29/2012 Document Reviewed:  04/24/2008 [facility] Patient Information 2013 [place], [place]. Bronchitis Bronchitis is a problem of the air tubes leading to your lungs. This problem makes it hard for air to get in and out of the lungs. You may cough a lot because your air tubes are narrow. Going without care can cause lasting (chronic) bronchitis. HOME CARE   Drink enough fluids to keep your pee (urine) clear or pale yellow.  Use a cool mist humidifier.  Quit smoking if you smoke. If you keep smoking, the bronchitis might not get better.  Only take medicine as told by your doctor. GET HELP RIGHT AWAY IF:   Coughing keeps you awake.  You start to wheeze.  You become more sick or weak.  You have a hard time breathing or get short of breath.  You cough up blood.  Coughing lasts more than 2 weeks.  You have a fever.  Your baby is older than 3 months with a rectal temperature of 102 F (38.9 C) or higher.  Your baby is 65 months old or younger with a rectal temperature of 100.4 F (38 C) or higher. MAKE SURE YOU:  Understand these instructions.  Will watch your condition.  Will get help right away if you are not doing well or get worse. Document Released: 04/24/2008 Document Revised: 01/29/2012 Document Reviewed: 10/08/2009 [facility] Patient Information 2013 [place], [place]. Smoking Cessation Quitting smoking is important to your health and has many advantages. However, it is not always easy to quit since nicotine is a very addictive drug. Often times, people try 3 times or more before being able to quit. This document explains the best ways for you to prepare to quit smoking. Quitting takes hard work and a lot of effort, but you can do it. ADVANTAGES OF QUITTING SMOKING  You will live longer, feel better, and live better.  Your body will feel the impact of quitting smoking almost immediately.  Within 20 minutes, blood pressure decreases. Your pulse returns to its normal level.  After 8 hours, carbon  monoxide levels in the blood return to normal. Your oxygen level increases.  After 24 hours, the chance of having a heart attack starts to decrease. Your breath, hair, and body stop smelling like smoke.  After 48 hours, damaged nerve endings begin to recover. Your sense of taste and smell improve.  After 72 hours, the body is virtually free of nicotine. Your bronchial tubes relax and breathing becomes easier.  After 2 to 12 weeks, lungs can hold more air. Exercise becomes easier and circulation improves.  The risk of having a heart attack, stroke, cancer, or lung disease is greatly reduced.  After 1 year, the risk of coronary heart disease is cut in half.  After 5 years, the risk of stroke falls to the same as a nonsmoker.  After 10 years, the risk of lung cancer is cut in half and the risk of other cancers decreases significantly.  After 15 years, the risk of coronary heart disease drops, usually to the level of a nonsmoker.  If you are pregnant, quitting smoking will improve your chances of having a healthy baby.  The people you live with, especially any children, will be  healthier.  You will have extra money to spend on things other than cigarettes. QUESTIONS TO THINK ABOUT BEFORE ATTEMPTING TO QUIT You may want to talk about your answers with your caregiver.  Why do you want to quit?  If you tried to quit in the past, what helped and what did not?  What will be the most difficult situations for you after you quit? How will you plan to handle them?  Who can help you through the tough times? Your family? Friends? A caregiver?  What pleasures do you get from smoking? What ways can you still get pleasure if you quit? Here are some questions to ask your caregiver:  How can you help me to be successful at quitting?  What medicine do you think would be best for me and how should I take it?  What should I do if I need more help?  What is smoking withdrawal like? How can I get  information on withdrawal? GET READY  Set a quit date.  Change your environment by getting rid of all cigarettes, ashtrays, matches, and lighters in your home, car, or work. Do not let people smoke in your home.  Review your past attempts to quit. Think about what worked and what did not. GET SUPPORT AND ENCOURAGEMENT You have a better chance of being successful if you have help. You can get support in many ways.  Tell your family, friends, and co-workers that you are going to quit and need their support. Ask them not to smoke around you.  Get individual, group, or telephone counseling and support. Programs are available at [employer] and health centers. Call your local health department for information about programs in your area.  Spiritual beliefs and practices may help some smokers quit.  Download a "quit meter" on your computer to keep track of quit statistics, such as how long you have gone without smoking, cigarettes not smoked, and money saved.  Get a self-help book about quitting smoking and staying off of tobacco. LEARN NEW SKILLS AND BEHAVIORS  Distract yourself from urges to smoke. Talk to someone, go for a walk, or occupy your time with a task.  Change your normal routine. Take a different route to work. Drink tea instead of coffee. Eat breakfast in a different place.  Reduce your stress. Take a hot bath, exercise, or read a book.  Plan something enjoyable to do every day. Reward yourself for not smoking.  Explore interactive web-based programs that specialize in helping you quit. GET MEDICINE AND USE IT CORRECTLY Medicines can help you stop smoking and decrease the urge to smoke. Combining medicine with the above behavioral methods and support can greatly increase your chances of successfully quitting smoking.  Nicotine replacement therapy helps deliver nicotine to your body without the negative effects and risks of smoking. Nicotine replacement therapy includes  nicotine gum, lozenges, inhalers, nasal sprays, and skin patches. Some may be available over-the-counter and others require a prescription.  Antidepressant medicine helps people abstain from smoking, but how this works is unknown. This medicine is available by prescription.  Nicotinic receptor partial agonist medicine simulates the effect of nicotine in your brain. This medicine is available by prescription. Ask your caregiver for advice about which medicines to use and how to use them based on your health history. Your caregiver will tell you what side effects to look out for if you choose to be on a medicine or therapy. Carefully read the information on the package. Do not  use any other product containing nicotine while using a nicotine replacement product.  RELAPSE OR DIFFICULT SITUATIONS Most relapses occur within the first 3 months after quitting. Do not be discouraged if you start smoking again. Remember, most people try several times before finally quitting. You may have symptoms of withdrawal because your body is used to nicotine. You may crave cigarettes, be irritable, feel very hungry, cough often, get headaches, or have difficulty concentrating. The withdrawal symptoms are only temporary. They are strongest when you first quit, but they will go away within 10 14 days. To reduce the chances of relapse, try to:  Avoid drinking alcohol. Drinking lowers your chances of successfully quitting.  Reduce the amount of caffeine you consume. Once you quit smoking, the amount of caffeine in your body increases and can give you symptoms, such as a rapid heartbeat, sweating, and anxiety.  Avoid smokers because they can make you want to smoke.  Do not let weight gain distract you. Many smokers will gain weight when they quit, usually less than 10 pounds. Eat a healthy diet and stay active. You can always lose the weight gained after you quit.  Find ways to improve your mood other than smoking. FOR  MORE INFORMATION  www.smokefree.gov  Document Released: 10/31/2001 Document Revised: 05/07/2012 Document Reviewed: 02/15/2012 [facility] Patient Information 2013 [place], [place].

## 2012-10-08 NOTE — Assessment & Plan Note (Signed)
Ed smoking cessation and given information

## 2012-10-08 NOTE — Assessment & Plan Note (Signed)
Disc wt loss. Pt is taking bee pollen as appetite suppressant. Advised supplements may be dangerous with chronic medical conditions Ed goal wt for height of 5'1" should be 130-135 lbs.

## 2012-10-08 NOTE — Progress Notes (Signed)
Report on patient was called to [id number].  Pt was transferred via wheelchair to [id number].  Angelina Ok, RN 10/08/2012 12:50 PM.

## 2012-10-08 NOTE — Assessment & Plan Note (Addendum)
Sx's seem exacerbated by acute illness with bronchitis  Plan Given Albuterol/Atrovent neb x 1 in clinic and performed peak flows before and after  Admit to observation with IV steroids, neb and bronchodilator treatments  Consider pfts to see if patient has COPD as well due to chronic smoking history.  She was on Flovent diskus in the past not she just has an albuterol inhaler She needs education and counseling on asthma and medications

## 2012-10-08 NOTE — H&P (Signed)
Date: 10/08/2012               Patient Name:  Crystal English MRN: [id number]  DOB: 1966/05/18 Age / Sex: 46 y.o., female   PCP: Tacey Heap              Medical Service: Internal Medicine Teaching Service              Attending Physician: Dr. Kem Kays    First Contact: Dr. Collier Bullock Pager: [phone]  Second Contact: Dr. Clyde Lundborg Pager: [phone]            After Hours (After 5p/  First Contact Pager: [phone]  weekends / holidays): Second Contact Pager: [phone]     Chief Complaint: shortness of  breath  History of Present Illness: Patient is a 46 y.o. female with a PMHx of intermittent asthma (has never previously required intubation), HTN, ongoing tobacco abuse who is admitted to [facility] for evaluation of cough, difficulty breathing. Patient indicates that her symptoms started as a sore throat on 10/03/2012, this slowly progressed to sneezing and rhinorrhea, development of cough, and then intermittent subjective fevers / chills on 11/16 evening. On 11/17 the symptoms continued to worsen, and she presented to the ED for evaluation and treatment after developing shortness of breath with minimal exertion. In the ER on 10/06/2012, chest x-ray was negative for elevation indicative of pneumonia. The patient was diagnosed with bronchitis, treated with albuterol/ atrovent nebs and IV steroids, with improvement of symptoms. She notes that it multiple times during the ED course, she was noted to have mildly low oxygen saturations on room air, to 80s. She however preferred to be discharged home - 4, the patient was started on a prednisone taper and azithromycin, which she's been taking regularly and without missing doses. She has required increased Albuterol usage to 2 puffs three times a day over last several days (this is an increase from baseline where she uses it only every several months).   Since ER discharge, the patient indicates that her breathing has slightly improved, although not returned to her  baseline. Therefore, she presented to clinic today for further evaluation. During the clinic course, she was noted to have significant wheezing. Despite treatment with albuterol and Atrovent nebulizer, the patient did not have resolution of her symptoms. As well, ambulatory oxygen saturations were less than 80%. Therefore, the patient is admitted for continued of her asthma exacerbation.  Patient also indicates complaint of chest pain that started within the past few hours prior to admission. She describes it as a  dull, intermittent chest pain located over mid chest, that does not radiate. Episodes last approximately 1 minute at a time with 1  Hour(s) in between episodes. Currently, the pain is rated a 0/10 in severity. At its worst, the pain is rated a 3/10 in severity. Aggravated by none. Alleviated by none. Admits to associated none. Denies associated diaphoresis, nausea, palpitations and shortness of breath.   Review of Systems: Constitutional:  admits to subjective fever, chills, decreased appetite. Denies diaphoresis and fatigue.  HEENT: denies photophobia, eye pain, redness, hearing loss, ear pain, congestion, sore throat, rhinorrhea, sneezing, neck pain, neck stiffness and tinnitus.  Respiratory: admits to SOB, DOE, cough, chest tightness, and wheezing.  Cardiovascular: denies chest pain, palpitations and leg swelling.  Gastrointestinal: admits to loose and watery stools x 2-3 days (2 episodes) Denies nausea, vomiting, abdominal pain, constipation, blood in stool.  Genitourinary: admits to increased urinary frequency. Denies dysuria, urgency, frequency, hematuria,  flank pain and difficulty urinating.  Musculoskeletal: admits to  Myalgias in upper part of body, back pain. Denies lower extremity myalgias, joint swelling, arthralgias and gait problem.   Skin: denies pallor, rash and wound.  Neurological: denies dizziness, seizures, syncope, weakness, light-headedness, numbness and headaches.    Hematological: denies adenopathy, easy bruising, personal or family bleeding history.  Psychiatric/ Behavioral: denies suicidal ideation, mood changes, confusion, nervousness, sleep disturbance and agitation.    Current Medications: Current Outpatient Prescriptions Medication Sig  . albuterol (PROVENTIL HFA;VENTOLIN HFA) 108 (90 BASE) MCG/ACT inhaler Inhale 2 puffs into the lungs every 6 (six) hours as needed. SOB/Wheezing  . azithromycin (ZITHROMAX) 250 MG tablet Take one tab PO QD  . guaiFENesin (MUCINEX) 600 MG 12 hr tablet Take 1,200 mg by mouth 2 (two) times daily.  Marland Kitchen lisinopril-hydrochlorothiazide (PRINZIDE,ZESTORETIC) 20-12.5 MG per tablet Take 1 tablet by mouth daily.  . predniSONE (DELTASONE) 10 MG tablet Take 5 tab day 1, take 4 tab day 2, take 3 tab day 3, take 2 tab day 4, and take 1 tab day 5   Clinic Ordered Medications: Medication Dose  . [COMPLETED] albuterol (PROVENTIL) (2.5 MG/3ML) 0.083% nebulizer solution 2.5 mg  2.5 mg  . [COMPLETED] ipratropium (ATROVENT) nebulizer solution 0.5 mg  0.5 mg    Allergies: No Known Allergies   Past Medical History: Past Medical History  Diagnosis Date  . Tobacco abuse   . Asthma   . Hypertension   . Headache     Past Surgical History: Past Surgical History  Procedure Date  . Laparotomy     for tubal pregnancy  . Tubal ligation     Family History: Family History  Problem Relation Age of Onset  . Diabetes Mother   . Hypertension Mother   . Stroke Father   . Heart disease Father     died <47 y.o  . Diabetes Maternal Aunt   . Cancer Maternal Aunt     breast   . Heart disease Maternal Grandmother   . Arthritis Mother   . Hyperlipidemia Mother   . Asthma Maternal Aunt     Social History: History   Social History  . Marital Status: Single    Spouse Name: N/A    Number of Children: 3  . Years of Education: some coll.   Occupational History  . Claims processing    Social History Main Topics  . Smoking  status: Current Every Day Smoker -- 0.1 packs/day for 30 years    Types: Cigarettes  . Smokeless tobacco: Never Used     Comment: cutting back  . Alcohol Use: Yes     Comment: socially  . Drug Use: No  . Sexually Active: Not on file   Other Topics Concern  . Not on file   Social History Narrative   Lives in [place] alone.     Vital Signs: BP 166/116  Pulse 89  Temp 98 F (36.7 C) (Oral)  Resp 17  SpO2 95%  LMP 08/08/2012   Physical Exam: General: Vital signs reviewed and noted. Well-developed, well-nourished, in no acute distress; alert, appropriate and cooperative throughout examination. The patient is able to speak in full sentences without shortness of breath. She has no sensory muscle usage. She is not sustaining herself in a tripod position.   Head: Normocephalic, atraumatic.  Eyes: PERRL, EOMI, No signs of anemia or jaundince.  Nose: Mucous membranes moist, not inflammed, nonerythematous.  Throat: Oropharynx erythematous without notable tonsillar enlargement, no exudate appreciated.  Neck: No deformities, masses, or tenderness noted. Supple, No carotid Bruits, no JVD.  Lungs:  Normal respiratory effort. Diffuse wheezes throughout lung fields, mainly end expiratory.  Heart: RRR. S1 and S2 normal without gallop, murmur, or rubs.  Abdomen:  BS normoactive. Soft, Nondistended, non-tender.  No masses or organomegaly.  Extremities: No pretibial edema.  Neurologic: A&O X3, CN II - XII are grossly intact. Motor strength is 5/5 in the all 4 extremities, Sensations intact to light touch, Cerebellar signs negative.  Skin: No visible rashes.    Lab results:  CURRENT LABS: CBC:    Component Value Date/Time   WBC 6.0 10/25/2011 1028   HGB 13.5 10/25/2011 1028   HCT 42.8 10/25/2011 1028   PLT 252 10/25/2011 1028   MCV 95.7 10/25/2011 1028   NEUTROABS 4.6 10/25/2011 1028   LYMPHSABS 0.8 10/25/2011 1028   MONOABS 0.6 10/25/2011 1028   EOSABS 0.0 10/25/2011 1028   BASOSABS 0.0  10/25/2011 1028     Metabolic Panel:    Component Value Date/Time   NA 141 04/09/2012 0901   K 4.0 04/09/2012 0901   CL 102 04/09/2012 0901   CO2 27 04/09/2012 0901   BUN 11 04/09/2012 0901   CREATININE 0.86 04/09/2012 0901   CREATININE 0.87 04/26/2010 1905   GLUCOSE 127* 04/09/2012 0901   CALCIUM 9.4 04/09/2012 0901   AST 19 10/25/2011 1028   ALT 11 10/25/2011 1028   ALKPHOS 86 10/25/2011 1028   BILITOT 0.3 10/25/2011 1028   PROT 7.0 10/25/2011 1028   ALBUMIN 4.2 10/25/2011 1028     Urinalysis: No results found for this basename: COLORURINE:2,APPERANCEUR:2,LABSPEC:2,PHURINE:2,GLUCOSEU:2,HGBUR:2,BILIRUBINUR:2,KETONESUR:2,PROTEINUR:2,UROBILINOGEN:2,NITRITE:2,LEUKOCYTESUR:2 in the last 72 hours   Drugs of Abuse  No results found for this basename: labopia,  cocainscrnur,  labbenz,  amphetmu,  thcu,  labbarb     Troponin (Point of Care Test) No results found for this basename: TROPIPOC in the last 72 hours   HISTORICAL LABS: Lab Results  Component Value Date   HGBA1C 6.3 04/26/2010    Lab Results  Component Value Date   CHOL 159 10/25/2011   HDL 57 10/25/2011   LDLCALC 91 10/25/2011   TRIG 53 10/25/2011   CHOLHDL 2.8 10/25/2011    Lab Results  Component Value Date   TSH 0.472 03/05/2007     Imaging results:   Dg Chest 2 View (10/06/2012) - Mild chronic bronchitic changes.   Original Report Authenticated By: Ulyses Southward, M.D.      Assessment & Plan:  Pt is a 46 y.o. yo female with a PMHx of asthma, HTN, ongoing tobacco abuse who was admitted on 10/08/2012 with symptoms of persistent cough, shortness of breath with minimal activity, and O2 sat of 86% in clinic, which is thought likely 2/2 asthma exacerbation in the setting of URI. Interventions at this time will be focused on treating her for the asthma exacerbation and awaiting to see if need for short term O2 needs.    1) Asthma exacerbation - likely 2/2 URI or bronchitis, no indication of PNA per CXR on 11/17. Given that the  patient has already attempted outpatient prednisone and Z-pack with improvement of symptoms and persistent hypoxia with O2 to 87% on room air with ambulation, she likely needs around the clock nebs, and we will additionally provide IV steroids. Of note, at baseline, the patient uses her albuterol only every several months, and although was previously on inhaled steroids, may not need additional maintenance regimen given infrequency of albuterol requirements. She does need  to stop smoking.  Plan: - Admit to med-surg. - Check rapid flu. - Continue Zpack (day 3/5). - Albuterol/ Atrovent nebs q6h scheduled and q2h PRN. - IV solumedrol 60mg  q12 hours.  - Continuous pulse ox, with O2 supplementation if O2 sat < 92%.  - tessalon perles for cough (avoiding tussionex or other similar in setting of #2).  2) HTN - BP elevated on admission, patient remains asymptomatic with this. Of note, the patient's BP is not well controlled as an outpatient with SBP typically > 150 mmHg. Will avoid addition of BB in setting of #1. - Will escalate her lisinopril to 40 mg daily. - Continue her HCTZ at current 12.5mg .  3) Chest pain - very atypical chest pain, thought likely MSK pain in setting of her significant coughing versus chest tightness in setting of # #1. She has minimal cardiac risk factors (only smoking) and very atypical presentation, therefore, this is not considered a likely etiology. - Will continue to monitor. - Pending EKG.  4) Tobacco abuse - states she has stopped over last 1 week, difficulty with quitting the last daily cigarette, although she wants to. I discussed potential complications of ongoing smoking, benefits of quitting, and ways to quit. - Smoking cessation counseling provided. - Will add quitting tips to DC instructions for continued review.  5) Hyperglycemia - BS per CMET is elevated at 240. Likely 2/2 IV steroids. However, does have have history of prediabetes per record review (A1c 6.3  in 04/2010). - Check AM A1c, consider addition of Metformin versus lifestyle modification if indication of prediabetes.    DVT PPX - low molecular weight heparin  CODE STATUS - FULL CODE - this was discussed with the patient.  CONSULTS PLACED - None.  DISPO - Disposition is deferred at this time, awaiting improvement of respiratory status.   Anticipated discharge in approximately 1-2 day(s).   The patient does have a current PCP (NUTAN, FNU, MD), therefore will be requiring OPC follow-up after discharge.   The patient's transportation needs will need to be addressed during the hospital course.   SERVICE NEEDED AT DISCHARGE - TO BE DETERMINED DURING HOSPITAL COURSE         Y = Yes, Blank = No PT:   OT:   RN:   Equipment:   Other:      Signed: Priscella Mann, DO  PGY-3, Internal Medicine Resident 10/08/2012, 12:29 PM

## 2012-10-08 NOTE — Progress Notes (Signed)
I saw patient and discussed her care with resident Dr. Shirlee Latch.  She has diffuse expiratory wheezing despite outpatient prednisone, and her O2 saturation dropped to 86% with ambulation following albuterol nebulizer treatment.  I agree with plan to admit for management of acute asthma exacerbation.

## 2012-10-08 NOTE — Assessment & Plan Note (Addendum)
Uncontrolled  Cont. Lisinopril-HCTZ 20-12.5 qd  BP 162/114 today with recheck 159/107 Pressures may be elevated with acute respiratory illness

## 2012-10-08 NOTE — Assessment & Plan Note (Addendum)
Counseled on smoking cessation as patient also has asthma which may be contributors to bronchitis  Per up to date may expect cough x 2-3 weeks and airway hyperreactivity x 5-6 weeks  Peak flows 120, 210,240 before neb tx with Atrovent/Albuterol and patient desaturated to 87 % w/o O2 and with ambulation Will do Atrovent/Albuterol neb treatment and repeat peak flows (200, 240, 250) and ambulation without O2 sat went to 86%  Plan Continue sx'matic tx course which is recommended and ED gave the pt steroids, Azithromycin, Albuterol inhaler, and Mucinex  Patient declined flu shot

## 2012-10-09 DIAGNOSIS — J45901 Unspecified asthma with (acute) exacerbation: Secondary | ICD-10-CM

## 2012-10-09 DIAGNOSIS — R079 Chest pain, unspecified: Secondary | ICD-10-CM

## 2012-10-09 DIAGNOSIS — I10 Essential (primary) hypertension: Secondary | ICD-10-CM

## 2012-10-09 DIAGNOSIS — F17200 Nicotine dependence, unspecified, uncomplicated: Secondary | ICD-10-CM

## 2012-10-09 LAB — GLUCOSE, CAPILLARY: Glucose-Capillary: 117 mg/dL — ABNORMAL HIGH (ref 70–99)

## 2012-10-09 LAB — URINALYSIS, ROUTINE W REFLEX MICROSCOPIC
Bilirubin Urine: NEGATIVE
Nitrite: NEGATIVE
Protein, ur: NEGATIVE mg/dL
Specific Gravity, Urine: 1.028 (ref 1.005–1.030)
Urobilinogen, UA: 0.2 mg/dL (ref 0.0–1.0)

## 2012-10-09 LAB — COMPREHENSIVE METABOLIC PANEL
Alkaline Phosphatase: 98 U/L (ref 39–117)
BUN: 17 mg/dL (ref 6–23)
CO2: 26 mEq/L (ref 19–32)
Chloride: 104 mEq/L (ref 96–112)
Creatinine, Ser: 0.73 mg/dL (ref 0.50–1.10)
GFR calc non Af Amer: >90 mL/min (ref >90)
Potassium: 4.5 mEq/L (ref 3.5–5.1)
Total Bilirubin: 0.2 mg/dL — ABNORMAL LOW (ref 0.3–1.2)

## 2012-10-09 LAB — URINE MICROSCOPIC-ADD ON

## 2012-10-09 LAB — CBC
MCH: 30.1 pg (ref 26.0–34.0)
Platelets: 249 10*3/uL (ref 150–400)
RBC: 4.42 MIL/uL (ref 3.87–5.11)
RDW: 15 % (ref 11.5–15.5)
WBC: 14.2 10*3/uL — ABNORMAL HIGH (ref 4.0–10.5)

## 2012-10-09 LAB — INFLUENZA PANEL BY PCR (TYPE A & B)
H1N1 flu by pcr: NOT DETECTED
Influenza A By PCR: NEGATIVE

## 2012-10-09 LAB — HEMOGLOBIN A1C: Hgb A1c MFr Bld: 6.3 % — ABNORMAL HIGH (ref <5.7)

## 2012-10-09 MED ORDER — METHYLPREDNISOLONE SODIUM SUCC 40 MG IJ SOLR
40.0000 mg | Freq: Every day | INTRAMUSCULAR | Status: DC
  Administered 2012-10-09: 40 mg via INTRAVENOUS
  Filled 2012-10-09: qty 1

## 2012-10-09 MED ORDER — LISINOPRIL 20 MG PO TABS
20.0000 mg | ORAL_TABLET | Freq: Once | ORAL | Status: AC
  Administered 2012-10-09: 20 mg via ORAL
  Filled 2012-10-09 (×2): qty 1

## 2012-10-09 MED ORDER — DIPHENHYDRAMINE HCL 25 MG PO CAPS
25.0000 mg | ORAL_CAPSULE | Freq: Every evening | ORAL | Status: DC | PRN
  Administered 2012-10-09: 25 mg via ORAL
  Filled 2012-10-09: qty 1

## 2012-10-09 MED ORDER — PREDNISONE 50 MG PO TABS
50.0000 mg | ORAL_TABLET | Freq: Every day | ORAL | Status: DC
  Administered 2012-10-10: 50 mg via ORAL
  Filled 2012-10-09 (×2): qty 1

## 2012-10-09 NOTE — Progress Notes (Signed)
Subjective: Crystal English is feeling better this morning. Shortness of breath has resolved, she states she would like to go home. She did have an episode of chest pain last night but she thinks this was from anxiety. She was thinking about the difficulties in her relationship at the time of onset. She has since had no chest pain. EKG normal sinus rhythm, no ST changes  She states that she has had urinary frequency and some mild dysuria over the past 2 days, she worried about a developing UTI.  Objective: Vital signs in last 24 hours: Filed Vitals:   10/08/12 1718 10/08/12 2010 10/08/12 2127 10/09/12 0505  BP: 166/116  169/112 154/99  Pulse: 89  88 87  Temp:   98.8 F (37.1 C) 98.4 F (36.9 C)  TempSrc:   Oral Oral  Resp: 17  16 16   Height:      Weight:    194 lb 3.6 oz (88.1 kg)  SpO2: 95% 96% 94% 94%   Weight change:  No intake or output data in the 24 hours ending 10/09/12 0906  Physical Exam Blood pressure 154/99, pulse 87, temperature 98.4 F (36.9 C), temperature source Oral, resp. rate 16, height 5' (1.524 m), weight 194 lb 3.6 oz (88.1 kg), last menstrual period 08/08/2012, SpO2 94.00%. General:  No acute distress, alert and oriented x 3, well-appearing  HEENT:  PERRL, EOMI, moist mucous membranes Cardiovascular:  Regular rate and rhythm, no murmurs Back: no CVA tenderness Respiratory:  Bilateral scattered exp wheezes, no increase WOB, no accessory muscle use Abdomen:  Soft, nondistended, nontender, bowel sounds present Extremities:  Warm and well-perfused, no edema.  Skin: Warm, dry, no rashes Neuro: Not anxious appearing, no depressed mood, normal affect  Lab Results: CBC    Component Value Date/Time   WBC 14.2* 10/09/2012 0615   RBC 4.42 10/09/2012 0615   HGB 13.3 10/09/2012 0615   HCT 40.8 10/09/2012 0615   PLT 249 10/09/2012 0615   MCV 92.3 10/09/2012 0615   MCH 30.1 10/09/2012 0615   MCHC 32.6 10/09/2012 0615   RDW 15.0 10/09/2012 0615   LYMPHSABS 1.4  10/08/2012 1401   MONOABS 0.4 10/08/2012 1401   EOSABS 0.0 10/08/2012 1401   BASOSABS 0.0 10/08/2012 1401    BMET    Component Value Date/Time   NA 140 10/09/2012 0615   K 4.5 10/09/2012 0615   CL 104 10/09/2012 0615   CO2 26 10/09/2012 0615   GLUCOSE 142* 10/09/2012 0615   BUN 17 10/09/2012 0615   CREATININE 0.73 10/09/2012 0615   CREATININE 0.86 04/09/2012 0901   CALCIUM 9.3 10/09/2012 0615   GFRNONAA >90 10/09/2012 0615   GFRAA >90 10/09/2012 0615    Studies/Results: Dg Chest 2 View  10/08/2012  *RADIOLOGY REPORT*  Clinical Data: Asthma exacerbation.  Short of breath  CHEST - 2 VIEW  Comparison:  10/06/2012  Findings:  The heart size and mediastinal contours are within normal limits.  Both lungs are clear.  The visualized skeletal structures are unremarkable.  IMPRESSION: No active cardiopulmonary disease.   Original Report Authenticated By: Janeece Riggers, M.D.     Medications: medications reviewed Scheduled Meds:   . albuterol  2.5 mg Nebulization TID  . azithromycin  250 mg Oral Daily  . enoxaparin (LOVENOX) injection  40 mg Subcutaneous Q24H  . hydrochlorothiazide  12.5 mg Oral Daily  . [COMPLETED] lisinopril  20 mg Oral Once  . lisinopril  40 mg Oral Daily  . methylPREDNISolone (SOLU-MEDROL) injection  60  mg Intravenous Q12H  . [DISCONTINUED] albuterol  2.5 mg Nebulization Q6H  . [DISCONTINUED] enoxaparin (LOVENOX) injection  40 mg Subcutaneous Q24H  . [DISCONTINUED] hydrochlorothiazide  12.5 mg Oral Daily  . [DISCONTINUED] lisinopril-hydrochlorothiazide  1 tablet Oral Daily   Continuous Infusions:  PRN Meds:.acetaminophen, acetaminophen, albuterol, benzonatate, ipratropium  Assessment/Plan:   Pt is a 46 y.o. yo female with a PMHx of asthma, HTN, ongoing tobacco abuse who was admitted on 10/08/2012 with symptoms of persistent cough, shortness of breath with minimal activity, and O2 sat of 86% in clinic, which is thought likely 2/2 asthma exacerbation in the  setting of URI. Interventions at this time will be focused on treating her for the asthma exacerbation and awaiting to see if need for short term O2 needs.   1) Asthma exacerbation - likely 2/2 URI or bronchitis, no indication of PNA per CXR on 11/17. Given that the patient has already attempted outpatient prednisone and Z-pack with improvement of symptoms and persistent hypoxia with O2 to 87% on room air with ambulation, she likely needs around the clock nebs, and we will additionally provide IV steroids. Of note, at baseline, the patient uses her albuterol only every several months, and although was previously on inhaled steroids, may not need additional maintenance regimen given infrequency of albuterol requirements. She does need to stop smoking. Leukocytosis likely 2/2 steroid  -restart PO prednisone taper today -now on day 4/5 of azithromycin -check pulse ox with ambulation -f/u rapid flu  -con scheduled nebs   2) HTN - BP elevated on admission, patient remains asymptomatic with this. Of note, the patient's BP is not well controlled as an outpatient with SBP typically > 150 mmHg. Will avoid addition of BB in setting of #1.  - cont increased lisinopril to 40 mg daily.  - Continue her HCTZ at current 12.5mg .   3) Chest pain - very atypical chest pain, thought likely MSK pain in setting of her significant coughing versus chest tightness in setting of #1 with some episodic anxiety 2/2 recent relationship stress. She has minimal cardiac risk factors (only smoking) and very atypical presentation, therefore, this is not considered a likely etiology. EKG wnl. - Will continue to monitor.   4) Tobacco abuse - states she has stopped over last 1 week, difficulty with quitting the last daily cigarette, although she wants to. I discussed potential complications of ongoing smoking, benefits of quitting, and ways to quit.  - Smoking cessation counseling provided.    5) Hyperglycemia - BS per CMET is  elevated at 240. Likely 2/2 IV steroids. However, does have have history of prediabetes per record review (A1c 6.3 in 04/2010).  - A1c pending, consider addition of Metformin versus lifestyle modification if indication of prediabetes.  Dysuria -will check UA  PPx -lovenox  Dispo -ambulate with pulse of, check for hypoxemia -f/u in [facility]   LOS: 1 day   [place] Ar 10/09/2012, 9:06 AM

## 2012-10-09 NOTE — Progress Notes (Signed)
Patient walked independently to the end of the hall toward [address] and back to her room.  Her pulse oximetry readings remained between the values of 94% and 97% while ambulating.

## 2012-10-09 NOTE — Progress Notes (Signed)
Pt said that MD came in and did Peak Flow with her. She said he score was on first attempt was 200 and second attempt was 300.

## 2012-10-09 NOTE — H&P (Signed)
INTERNAL MEDICINE TEACHING SERVICE Attending Admission Note  Date: 10/09/2012  Patient name: Crystal English  Medical record number: [id number]  Date of birth: September 04, 1966    I have seen and evaluated Crystal English and discussed their care with the Residency Team.  46 yr. Old female with hx of mild intermittent asthma, HTN, tobacco abuse, presented due to SOB. She was seen in clinic and noted to have significant wheezing as well as O2 sats in mid 80% range and therefore directly admitted. She states she has never required intubation and almost never needs to use an albuterol inhaler. She states she's only had one previous asthma exacerbation. She admits she continues to use cigarettes.   She was seen a few days ago in the ED and given a prednsone taper as well as azithromycin course, but without much improvement. She also admits to some intermittent CP prior to admission that does not radiate, is not associated with diaphoresis, is not associated with exertion, is not associated nausea, and is not associated with SOB. She denies any prior hx of CP, but does admit to some coughing and associated recent sore throat for which she has been taking NSAIDs. She was admitted overnight and placed on O2, IV solumedrol, and atrovent/albuterol nebs. She has been noted to have O2 sats in the mid 90% range with ambulation today on RA. Peak flow was noted to be 300 out of an expected 460.  She states she feels better today, but is still noted to have significant wheezing on exam. CXR shows no evidence of infiltrate.  Physical Exam: Blood pressure 158/96, pulse 80, temperature 98.1 F (36.7 C), temperature source Oral, resp. rate 16, height 5' (1.524 m), weight 194 lb 3.6 oz (88.1 kg), last menstrual period 08/08/2012, SpO2 95.00%.  General: Vital signs reviewed and noted. Well-developed, well-nourished, in no acute distress; alert, appropriate and cooperative throughout examination.  Head:  Normocephalic, atraumatic.  Eyes: PERRL, EOMI, No signs of anemia or jaundince.  Nose: Mucous membranes moist, not inflammed, nonerythematous.  Throat: Oropharynx nonerythematous, no exudate appreciated.   Neck: No deformities, masses, or tenderness noted.Supple, No carotid Bruits, no JVD.  Lungs:  Normal respiratory effort. Bilat significant end expiratory wheeze, no rhonchi.  Heart: RRR. S1 and S2 normal without gallop, murmur, or rubs.  Abdomen:  BS normoactive. Soft, Nondistended, non-tender.  No masses or organomegaly.  Extremities: No pretibial edema.  Neurologic: A&O X3, CN II - XII are grossly intact. Motor strength is 5/5 in the all 4 extremities, Sensations intact to light touch, Cerebellar signs negative.  Skin: No visible rashes, scars.    Lab results: Results for orders placed during the hospital encounter of 10/08/12 (from the past 24 hour(s))  GLUCOSE, CAPILLARY     Status: Abnormal   Collection Time   10/08/12  9:25 PM      Component Value Range   Glucose-Capillary 182 (*) 70 - 99 mg/dL   Comment 1 Documented in Chart     Comment 2 Notify RN    INFLUENZA PANEL BY PCR     Status: Normal   Collection Time   10/08/12  9:40 PM      Component Value Range   Influenza A By PCR NEGATIVE  NEGATIVE   Influenza B By PCR NEGATIVE  NEGATIVE   H1N1 flu by pcr NOT DETECTED  NOT DETECTED  TROPONIN I     Status: Normal   Collection Time   10/08/12 11:37 PM  Component Value Range   Troponin I <0.30  <0.30 ng/mL  COMPREHENSIVE METABOLIC PANEL     Status: Abnormal   Collection Time   10/09/12  6:15 AM      Component Value Range   Sodium 140  135 - 145 mEq/L   Potassium 4.5  3.5 - 5.1 mEq/L   Chloride 104  96 - 112 mEq/L   CO2 26  19 - 32 mEq/L   Glucose, Bld 142 (*) 70 - 99 mg/dL   BUN 17  6 - 23 mg/dL   Creatinine, Ser 4.54  0.50 - 1.10 mg/dL   Calcium 9.3  8.4 - 09.8 mg/dL   Total Protein 7.1  6.0 - 8.3 g/dL   Albumin 3.1 (*) 3.5 - 5.2 g/dL   AST 12  0 - 37 U/L    ALT 12  0 - 35 U/L   Alkaline Phosphatase 98  39 - 117 U/L   Total Bilirubin 0.2 (*) 0.3 - 1.2 mg/dL   GFR calc non Af Amer >90  >90 mL/min   GFR calc Af Amer >90  >90 mL/min  CBC     Status: Abnormal   Collection Time   10/09/12  6:15 AM      Component Value Range   WBC 14.2 (*) 4.0 - 10.5 K/uL   RBC 4.42  3.87 - 5.11 MIL/uL   Hemoglobin 13.3  12.0 - 15.0 g/dL   HCT 11.9  14.7 - 82.9 %   MCV 92.3  78.0 - 100.0 fL   MCH 30.1  26.0 - 34.0 pg   MCHC 32.6  30.0 - 36.0 g/dL   RDW 56.2  13.0 - 86.5 %   Platelets 249  150 - 400 K/uL  HEMOGLOBIN A1C     Status: Abnormal   Collection Time   10/09/12  6:15 AM      Component Value Range   Hemoglobin A1C 6.3 (*) <5.7 %   Mean Plasma Glucose 134 (*) <117 mg/dL  GLUCOSE, CAPILLARY     Status: Abnormal   Collection Time   10/09/12  7:56 AM      Component Value Range   Glucose-Capillary 117 (*) 70 - 99 mg/dL   Comment 1 Documented in Chart     Comment 2 Notify RN    URINALYSIS, ROUTINE W REFLEX MICROSCOPIC     Status: Abnormal   Collection Time   10/09/12  9:48 AM      Component Value Range   Color, Urine YELLOW  YELLOW   APPearance CLEAR  CLEAR   Specific Gravity, Urine 1.028  1.005 - 1.030   pH 5.5  5.0 - 8.0   Glucose, UA NEGATIVE  NEGATIVE mg/dL   Hgb urine dipstick MODERATE (*) NEGATIVE   Bilirubin Urine NEGATIVE  NEGATIVE   Ketones, ur NEGATIVE  NEGATIVE mg/dL   Protein, ur NEGATIVE  NEGATIVE mg/dL   Urobilinogen, UA 0.2  0.0 - 1.0 mg/dL   Nitrite NEGATIVE  NEGATIVE   Leukocytes, UA NEGATIVE  NEGATIVE  URINE MICROSCOPIC-ADD ON     Status: Abnormal   Collection Time   10/09/12  9:48 AM      Component Value Range   Squamous Epithelial / LPF FEW (*) RARE   WBC, UA 0-2  <3 WBC/hpf   RBC / HPF 11-20  <3 RBC/hpf   Bacteria, UA RARE  RARE  TROPONIN I     Status: Normal   Collection Time   10/09/12 11:29 AM  Component Value Range   Troponin I <0.30  <0.30 ng/mL  GLUCOSE, CAPILLARY     Status: Abnormal   Collection Time    10/09/12 12:14 PM      Component Value Range   Glucose-Capillary 183 (*) 70 - 99 mg/dL   Comment 1 Documented in Chart     Comment 2 Notify RN      Imaging results:  Dg Chest 2 View  10/08/2012  *RADIOLOGY REPORT*  Clinical Data: Asthma exacerbation.  Short of breath  CHEST - 2 VIEW  Comparison:  10/06/2012  Findings:  The heart size and mediastinal contours are within normal limits.  Both lungs are clear.  The visualized skeletal structures are unremarkable.  IMPRESSION: No active cardiopulmonary disease.   Original Report Authenticated By: Janeece Riggers, M.D.      Assessment and Plan: I agree with the formulated Assessment and Plan with the following changes: 46 yr. Old female with hx of mild intermittent asthma, HTN, tobacco abuse, presented due to SOB, found to have acute asthmatic exacerbation with acute hypoxic respiratory failure. 1) Acute asthma exacerbation with acute hypoxic respiratory failure: trigger may have been a recent URI and tobacco abuse. I would change her solumedrol to PO prednisone and taper off over the next 14 days. Cont current nebs. She will complete 5 days total of azithromycin. She will need to repeat peak flow tomorrow morning, and if improved, she can be discharged. I discussed her need to quit tobacco use, she verbalized understanding.  2) HTN: She admits she has not taken her medication and has been using NSAIDs for sore throat. I advised her this can raise BP. I would use tylenol instead. Educated on use of BP meds. 3) CP: Atypical. Two negative troponins 12 hours apart. She will need evaluation as an outpatient by cardiology or PCP. EKG without ST changes, pain is resolved. -rest per resident note.  Jonah Blue, DO 11/20/20133:44 PM

## 2012-10-10 DIAGNOSIS — R0602 Shortness of breath: Secondary | ICD-10-CM

## 2012-10-10 MED ORDER — HYDROCHLOROTHIAZIDE 12.5 MG PO CAPS
12.5000 mg | ORAL_CAPSULE | Freq: Every day | ORAL | Status: DC
  Administered 2012-10-10: 12.5 mg via ORAL

## 2012-10-10 MED ORDER — PREDNISONE 10 MG PO TABS: ORAL_TABLET | ORAL | Status: DC

## 2012-10-10 NOTE — Progress Notes (Signed)
Crystal English to be D/C'd Home per MD order.  Discharge instructions reviewed and discussed with the patient, all questions and concerns answered. Copy of instructions and scripts given to patient.   Patients skin is clean, dry and intact no evidence of skin break down. IV site discontinued and catheter remains intact. Site without signs and symptoms of complications. Dressing and pressure applied.  Patient escorted to car by volanteers in a wheelchair,  no distress noted upon discharge.  Bing Quarry 10/10/2012 2:17 PM

## 2012-10-10 NOTE — Progress Notes (Signed)
INTERNAL MEDICINE TEACHING SERVICE Attending Note  Date: 10/10/2012  Patient name: Crystal English  Medical record number: [id number]  Date of birth: February 26, 1966    This patient has been seen and discussed with the house staff. Please see their note for complete details. I concur with their findings with the following additions/corrections: Feels well this morning. Peak flow around 400. Exam reveals no wheezing, but some slight rhonchi at bases. Denies SOB but admits to some dry cough. Denies gross hematuria and menses today; she was noted to have some RBC's in UA. She states she had some chest pain radiating to her back that is worse with movement and coughing, otherwise resolves on its own.   A/P: 46 yr. Old female with hx of mild intermittent asthma, HTN, tobacco abuse, presented due to SOB, found to have acute asthmatic exacerbation with acute hypoxic respiratory failure.  1) Acute asthma exacerbation with acute hypoxic respiratory failure: trigger may have been a recent URI and tobacco abuse. She is no longer hypoxic and able to ambulate on RA without desaturation. Peak flows improved. Needs to quit TOB. D/C on prednisone taper with albulterol inhaler as a rescue. She needs close follow up in clinic to consider additional therapy if needed. 2) HTN: She admits she has not taken her medication and has been using NSAIDs for sore throat. I advised her this can raise BP. I would use tylenol instead. Educated on use of BP meds. Lisinopril and HCTZ. Will need f/u in clinic. 3) CP: Atypical. Two negative troponins 12 hours apart. This is likely musculoskeletal due to coughing. Will need PCP f/u and I advised her to return to ED if reoccurs and concerns her. -rest per resident note. -ok to D/C home.  Jonah Blue, DO  10/10/2012, 2:11 PM

## 2012-10-10 NOTE — Discharge Summary (Signed)
Internal Medicine Teaching [facility] Discharge Note  Name: Crystal English MRN: [id number] DOB: 1966-02-17 46 y.o.  Date of Admission: 10/08/2012  1:05 PM Date of Discharge: 10/14/2012 Attending Physician: No att. providers found  Discharge Diagnosis: Principal Problem:  *Asthma with acute exacerbation Active Problems:  HYPERTENSION  SOB (shortness of breath)  Tobacco abuse   Discharge Medications:   Medication List     As of 10/14/2012  7:06 PM    STOP taking these medications         azithromycin 250 MG tablet   Commonly known as: ZITHROMAX      TAKE these medications         albuterol 108 (90 BASE) MCG/ACT inhaler   Commonly known as: PROVENTIL HFA;VENTOLIN HFA   Inhale 2 puffs into the lungs every 6 (six) hours as needed. SOB/Wheezing      guaiFENesin 600 MG 12 hr tablet   Commonly known as: MUCINEX   Take 1,200 mg by mouth 2 (two) times daily.      lisinopril-hydrochlorothiazide 20-12.5 MG per tablet   Commonly known as: PRINZIDE,ZESTORETIC   Take 1 tablet by mouth daily.      predniSONE 10 MG tablet   Commonly known as: DELTASONE   Take 4 tabs (40mg ) daily for 3 days, 3 tabs (30mg ) daily for 3 days, 2 tabs (20mg ) daily for 3 days, 1 tab daily for 3 days, then stop         Disposition and follow-up:   [name] was discharged from [facility] in stable condition.  At the hospital follow up visit please address the following:  -f/u asthma exacerbation, URI/bronchitis -repeat urinalysis in clinic, eval for hematuria -FH of MI and patient has atypical chest pain, will need risk factor stratification -work on better blood pressure control - patient may need increased dose of home meds -continue smoking cessation counseling, patient interested in quitting -A1c is 6.3 - may need diet control vs oral therapy  Follow-up Appointments: Follow-up Information    Follow up with Annett Gula, MD. On 10/16/2012.  (INTERNAL MEDICINE OUTPATIENT CLINIC - Your appointment is on 10/16/2012 at 1:15PM)    Contact information:   [address] [place] [address] [phone]         Discharge Orders    Future Appointments: Provider: Department: Dept Phone: Center:   10/16/2012 1:15 PM Elyse Jarvis, MD [facility] [phone] [facility]     Future Orders Please Complete By Expires   Diet - low sodium heart healthy      Increase activity slowly         Consultations:  none  Procedures Performed:  Dg Chest 2 View  10/08/2012  *RADIOLOGY REPORT*  Clinical Data: Asthma exacerbation.  Short of breath  CHEST - 2 VIEW  Comparison:  10/06/2012  Findings:  The heart size and mediastinal contours are within normal limits.  Both lungs are clear.  The visualized skeletal structures are unremarkable.  IMPRESSION: No active cardiopulmonary disease.   Original Report Authenticated By: Janeece Riggers, M.D.    Dg Chest 2 View  10/06/2012  *RADIOLOGY REPORT*  Clinical Data: Shortness of breath, cough, congestion, history bronchitis and asthma, smoker  CHEST - 2 VIEW  Comparison: 10/25/2011  Findings: Upper-normal size of cardiac silhouette. Mediastinal contours and pulmonary vascularity normal. Mild chronic peribronchial thickening. No acute infiltrate, pleural effusion or pneumothorax. Bones unremarkable.  IMPRESSION: Mild chronic bronchitic changes.   Original  Report Authenticated By: Ulyses Southward, M.D.       Admission HPI: Patient is a 46 y.o. female with a PMHx of intermittent asthma (has never previously required intubation), HTN, ongoing tobacco abuse who is admitted to [facility] for evaluation of cough, difficulty breathing. Patient indicates that her symptoms started as a sore throat on 10/03/2012, this slowly progressed to sneezing and rhinorrhea, development of cough, and then intermittent subjective fevers / chills on 11/16 evening. On 11/17 the symptoms continued to worsen, and  she presented to the ED for evaluation and treatment after developing shortness of breath with minimal exertion. In the ER on 10/06/2012, chest x-ray was negative for elevation indicative of pneumonia. The patient was diagnosed with bronchitis, treated with albuterol/ atrovent nebs and IV steroids, with improvement of symptoms. She notes that it multiple times during the ED course, she was noted to have mildly low oxygen saturations on room air, to 80s. She however preferred to be discharged home - 4, the patient was started on a prednisone taper and azithromycin, which she's been taking regularly and without missing doses. She has required increased Albuterol usage to 2 puffs three times a day over last several days (this is an increase from baseline where she uses it only every several months).  Since ER discharge, the patient indicates that her breathing has slightly improved, although not returned to her baseline. Therefore, she presented to clinic today for further evaluation. During the clinic course, she was noted to have significant wheezing. Despite treatment with albuterol and Atrovent nebulizer, the patient did not have resolution of her symptoms. As well, ambulatory oxygen saturations were less than 80%. Therefore, the patient is admitted for continued of her asthma exacerbation.  Patient also indicates complaint of chest pain that started within the past few hours prior to admission. She describes it as a dull, intermittent chest pain located over mid chest, that does not radiate. Episodes last approximately 1 minute at a time with 1 Hour(s) in between episodes. Currently, the pain is rated a 0/10 in severity. At its worst, the pain is rated a 3/10 in severity. Aggravated by none. Alleviated by none. Admits to associated none. Denies associated diaphoresis, nausea, palpitations and shortness of breath.   Hospital Course by problem list: Principal Problem:  *Asthma with acute exacerbation Active  Problems:  HYPERTENSION  SOB (shortness of breath)  Tobacco abuse   Asthma exacerbation Patient presented with shortness of breath and wheezing, consistent with asthma exacerbation. She had previously failed outpatient therapy with oral prednisone and azithromycin. She was hypoxic to 87%, therefore, she admitted to the hospital. CXR on 11/17 showed no evidence of pneumonia or fluid overload. She was started on scheduled duonebs, IV solumedrol, and continued on azithromycin to continue the 5-d course. By the day of discharge, her shortness of breath and resolved and her wheezing had improved. Peak flow was performed, showing improvement being just below the expected for her age and height. Patient was sent home with prednisone taper, albuterol inhaler, and with a peak flow meter to monitor her respiratory status at home. Patient does not meet criteria for suppressive asthma therapy at this time as she does not have frequent symptoms. This can be reassessed in clinic. Smoking cessation was discussed and risks and benefits reviewed.  Chest Pain During this admission, patient describes atypical chest pain that comes and goes, worse with movement and with coughing spells. Somewhat reproducible on exam. Troponins were negative, CXR was unremarkable as above, and EKG was  WNL. Her only risk factor for CAD is smoking, and she didn't have signs and symptoms of PE. Thought to be musculoskeletal pain vs anxiety from stressful relationship. Patient knows to return to the ED if chest pain persists or worsens.  Hypertension Patient has a hx of hypertension and her BP remained elevated during this admission. Her lisinopril was increased to 40mg  and she was continued on her home HCTZ 12.5mg . She will need titration of her BP meds in the outpatient setting without acute illness.  Urinary frequency Patient was complaining of urinary frequency during this hospitalization. She denies gross hematuria, fever, chills,  burning. UA revealed no pyuria, however, she did have RBCs. This may have been a contaminant from anal irritation, recommend repeat UA as outpatient and work up for microscopic hematuria if warranted.  Discharge Vitals:  BP 163/107  Pulse 89  Temp 98.4 F (36.9 C) (Oral)  Resp 14  Ht 5' (1.524 m)  Wt 187 lb 6.3 oz (85 kg)  BMI 36.60 kg/m2  SpO2 98%  LMP 08/08/2012  Discharge Labs:  No results found for this or any previous visit (from the past 24 hour(s)).  Signed: Denton Ar 10/14/2012, 7:06 PM   Time Spent on Discharge: 30 Services Ordered on Discharge: none Equipment Ordered on Discharge: none

## 2012-10-10 NOTE — Progress Notes (Signed)
Subjective: Crystal English is feeling well this morning. She continues to have mild chest pain, worse with moving and coughing. Reproducible on palpation. Breathing is back at baseline but she has some coughing spells. No productive cough. No shortness of breath. No gross hematuria.  Objective: Vital signs in last 24 hours: Filed Vitals:   10/09/12 2100 10/09/12 2105 10/10/12 0300 10/10/12 0454  BP: 175/109   159/116  Pulse: 88   75  Temp: 98.3 F (36.8 C)   98.4 F (36.9 C)  TempSrc: Oral   Oral  Resp: 16   14  Height:      Weight:   195 lb 9.6 oz (88.724 kg) 187 lb 6.3 oz (85 kg)  SpO2: 84% 94%  98%   Weight change: -1 lb 6.4 oz (-0.635 kg)  Intake/Output Summary (Last 24 hours) at 10/10/12 1022 Last data filed at 10/09/12 1400  Gross per 24 hour  Intake    240 ml  Output      0 ml  Net    240 ml    Physical Exam Blood pressure 159/116, pulse 75, temperature 98.4 F (36.9 C), temperature source Oral, resp. rate 14, height 5' (1.524 m), weight 187 lb 6.3 oz (85 kg), last menstrual period 08/08/2012, SpO2 98.00%. on RA General:  No acute distress, alert and oriented x 3, well-appearing  HEENT:  PERRL, EOMI, moist mucous membranes Cardiovascular:  Regular rate and rhythm, no murmurs Back: no CVA tenderness, thoracic spine is tender to palpation Respiratory:  Bilateral exp rhonchi as bases, no increased WOB, no accessory muscle use Abdomen:  Soft, nondistended, nontender, bowel sounds present Extremities:  Warm and well-perfused, no edema.  Skin: Warm, dry, no rashes Neuro: Not anxious appearing, no depressed mood, normal affect  Lab Results: CBC    Component Value Date/Time   WBC 14.2* 10/09/2012 0615   RBC 4.42 10/09/2012 0615   HGB 13.3 10/09/2012 0615   HCT 40.8 10/09/2012 0615   PLT 249 10/09/2012 0615   MCV 92.3 10/09/2012 0615   MCH 30.1 10/09/2012 0615   MCHC 32.6 10/09/2012 0615   RDW 15.0 10/09/2012 0615   LYMPHSABS 1.4 10/08/2012 1401   MONOABS 0.4  10/08/2012 1401   EOSABS 0.0 10/08/2012 1401   BASOSABS 0.0 10/08/2012 1401    BMET    Component Value Date/Time   NA 140 10/09/2012 0615   K 4.5 10/09/2012 0615   CL 104 10/09/2012 0615   CO2 26 10/09/2012 0615   GLUCOSE 142* 10/09/2012 0615   BUN 17 10/09/2012 0615   CREATININE 0.73 10/09/2012 0615   CREATININE 0.86 04/09/2012 0901   CALCIUM 9.3 10/09/2012 0615   GFRNONAA >90 10/09/2012 0615   GFRAA >90 10/09/2012 0615    Studies/Results: Dg Chest 2 View  10/08/2012  *RADIOLOGY REPORT*  Clinical Data: Asthma exacerbation.  Short of breath  CHEST - 2 VIEW  Comparison:  10/06/2012  Findings:  The heart size and mediastinal contours are within normal limits.  Both lungs are clear.  The visualized skeletal structures are unremarkable.  IMPRESSION: No active cardiopulmonary disease.   Original Report Authenticated By: Janeece Riggers, M.D.     Medications: medications reviewed Scheduled Meds:    . azithromycin  250 mg Oral Daily  . enoxaparin (LOVENOX) injection  40 mg Subcutaneous Q24H  . hydrochlorothiazide  12.5 mg Oral Daily  . [COMPLETED] lisinopril  20 mg Oral Once  . lisinopril  40 mg Oral Daily  . predniSONE  50 mg Oral Q  breakfast  . [DISCONTINUED] methylPREDNISolone (SOLU-MEDROL) injection  40 mg Intravenous Daily   Continuous Infusions:  PRN Meds:.acetaminophen, acetaminophen, albuterol, benzonatate, diphenhydrAMINE, ipratropium  Assessment/Plan:   Pt is a 46 y.o. yo female with a PMHx of asthma, HTN, ongoing tobacco abuse who was admitted on 10/08/2012 with symptoms of persistent cough, shortness of breath with minimal activity, and O2 sat of 86% in clinic, which is thought likely 2/2 asthma exacerbation in the setting of URI. Interventions at this time will be focused on treating her for the asthma exacerbation and awaiting to see if need for short term O2 needs.   1) Asthma exacerbation - likely 2/2 URI or bronchitis, no indication of PNA per CXR on 11/17. Given  that the patient has already attempted outpatient prednisone and Z-pack with improvement of symptoms and persistent hypoxia with O2 to 87% on room air with ambulation, she likely needs around the clock nebs, and we will additionally provide IV steroids. Of note, at baseline, the patient uses her albuterol only every several months, and although was previously on inhaled steroids, may not need additional maintenance regimen given infrequency of albuterol requirements. She does need to stop smoking. Leukocytosis likely 2/2 steroid use. Ambulated with pulse ox >94% yesterday without oxygen Breathing is back to baseline, not on O2. Peak flow is 420, almost at expected 458 for her age and height. Discussed use of peak flow meter and she will bring home and continue to use.  -PO prednisone taper today, will send home with Rx -now completed 5d of azithromycin -d/c with albuterol inhaler -reevaluate in clinic, no suppressive therapy for now as patient does not report frequent exacerbations  2) HTN - BP elevated on admission, patient remains asymptomatic with this. Of note, the patient's BP is not well controlled as an outpatient with SBP typically > 150 mmHg. Will avoid addition of BB in setting of #1.  - cont increased lisinopril to 40 mg daily.  - Continue her HCTZ at current 12.5mg  - will need close f/u in clinic  3) Chest pain - atypical chest pain, thought likely MSK pain in setting of her significant coughing versus chest tightness in setting of #1 with some episodic anxiety 2/2 recent relationship stress. She has minimal cardiac risk factors (only smoking) and very atypical presentation, therefore, this is not considered a likely etiology. EKG normal, troponins negative x2. She does have a FH of MI/CVA in father at age ~45. -She knows to return to the ED if further chest pain -f/u in clinic  4) Tobacco abuse - states she has stopped over last 1 week, difficulty with quitting the last daily  cigarette, although she wants to. I discussed potential complications of ongoing smoking, benefits of quitting, and ways to quit.  - Smoking cessation counseling provided.    5) Hyperglycemia - BS per CMET is elevated at 240. Likely 2/2 IV steroids. However, does have have history of prediabetes per record review (A1c 6.3 in 04/2010).  - A1c pending, consider addition of Metformin versus lifestyle modification if indication of prediabetes.  Urinary frequency: patient has frequency, no gross hematuria, no burning, no incontinence -UA without signs of infection, + RBCs, may be contaminant from anal irritation -recheck as outpatient  Loose Stools -She has noticed over the past few days she has had some soft stools. Not watery, nonbloody. Frequency 2-3x per day, getting better today. -not concerning for Cdiff at this point but she does have recent atb use as risk factor -will monitor and  f/u as outpatient  PPx -lovenox  Dispo -f/u in [facility] -will need repeat UA in clinic -stable for dc home today   LOS: 2 days   [place] Ar 10/10/2012, 10:22 AM

## 2012-10-11 LAB — URINE CULTURE: Colony Count: 2000

## 2012-10-11 NOTE — Care Management Note (Signed)
    Page 1 of 1   10/11/2012     6:00:19 PM   CARE MANAGEMENT NOTE 10/11/2012  Patient:  Crystal English, Crystal English   Account Number:  [id number]  Date Initiated:  10/11/2012  Documentation initiated by:  Letha Cape  Subjective/Objective Assessment:   admit     Action/Plan:   Anticipated DC Date:  10/10/2012   Anticipated DC Plan:  HOME/SELF CARE      DC Planning Services  CM consult      Choice offered to / List presented to:             Status of service:  Completed, signed off Medicare Important Message given?   (If response is "NO", the following Medicare IM given date fields will be blank) Date Medicare IM given:   Date Additional Medicare IM given:    Discharge Disposition:  HOME/SELF CARE  Per UR Regulation:  Reviewed for med. necessity/level of care/duration of stay  If discussed at Long Length of Stay Meetings, dates discussed:    Comments:  10/11/12 17: 58 Letha Cape RN, BSN [phone] pt dc, no needs anticipated.

## 2012-10-16 ENCOUNTER — Ambulatory Visit: Payer: BC Managed Care – PPO | Admitting: Internal Medicine

## 2012-10-16 NOTE — Discharge Summary (Signed)
INTERNAL MEDICINE TEACHING SERVICE Attending Note  Date: 10/16/2012  Patient name: Crystal English  Medical record number: [id number]  Date of birth: 1966-02-04    This patient has been seen and discussed with the house staff. Please see their note for complete details. I concur with their findings.   Jonah Blue, DO  10/16/2012, 5:01 PM

## 2012-10-21 ENCOUNTER — Ambulatory Visit (INDEPENDENT_AMBULATORY_CARE_PROVIDER_SITE_OTHER): Payer: BC Managed Care – PPO | Admitting: Radiation Oncology

## 2012-10-21 ENCOUNTER — Encounter: Payer: Self-pay | Admitting: Radiation Oncology

## 2012-10-21 VITALS — BP 151/98 | HR 90 | Temp 98.2°F | Ht 60.0 in | Wt 205.8 lb

## 2012-10-21 DIAGNOSIS — R319 Hematuria, unspecified: Secondary | ICD-10-CM

## 2012-10-21 DIAGNOSIS — Z720 Tobacco use: Secondary | ICD-10-CM

## 2012-10-21 DIAGNOSIS — F17200 Nicotine dependence, unspecified, uncomplicated: Secondary | ICD-10-CM

## 2012-10-21 DIAGNOSIS — E8881 Metabolic syndrome: Secondary | ICD-10-CM

## 2012-10-21 DIAGNOSIS — I10 Essential (primary) hypertension: Secondary | ICD-10-CM

## 2012-10-21 DIAGNOSIS — E88819 Insulin resistance, unspecified: Secondary | ICD-10-CM | POA: Insufficient documentation

## 2012-10-21 DIAGNOSIS — J45901 Unspecified asthma with (acute) exacerbation: Secondary | ICD-10-CM

## 2012-10-21 LAB — URINALYSIS, ROUTINE W REFLEX MICROSCOPIC
Glucose, UA: NEGATIVE mg/dL
Leukocytes, UA: NEGATIVE
pH: 5 (ref 5.0–8.0)

## 2012-10-21 NOTE — Addendum Note (Signed)
Addended by: Elenor Legato R on: 10/21/2012 11:43 AM   Modules accepted: Orders, Level of Service

## 2012-10-21 NOTE — Progress Notes (Signed)
  Subjective:    Patient ID: Crystal English, female    DOB: January 02, 1966, 46 y.o.   MRN: [id number]  HPI Pt is a 46 yo woman with PMH asthma, hypertension, who presents today for hospital follow-up. Pt was admitted for an acute asthma exacerbation approximately 2 weeks prior, and was discharged on a prednisone taper. She states that since the time of her discharge she has not had any recurrence of wheezing, SOB, or chest tightness as she was having prior to admission. She states that she has not needed to use her albuterol inhaler during this time either, and that tomorrow will be the last day of her prednisone taper. Patient has no complaints today, and states that she feels well.   Review of Systems  Constitutional: Negative for fever, chills and activity change.  HENT: Negative.   Eyes: Negative.   Respiratory: Negative for cough, chest tightness, shortness of breath and wheezing.   Cardiovascular: Negative for chest pain, palpitations and leg swelling.  Gastrointestinal: Negative for nausea, vomiting, abdominal pain and diarrhea.  Genitourinary: Negative.  Negative for dysuria and hematuria.  Musculoskeletal: Negative for back pain and arthralgias.  Neurological: Negative.   Hematological: Negative.   Psychiatric/Behavioral: Negative.    Filed Vitals:   10/21/12 0910  BP: 151/98  Pulse: 90  Temp: 98.2 F (36.8 C)       Objective:      Physical Exam  Constitutional: She is oriented to person, place, and time. She appears well-developed and well-nourished. No distress.  HENT:  Head: Normocephalic and atraumatic.  Eyes: Conjunctivae normal and EOM are normal. Pupils are equal, round, and reactive to light. No scleral icterus.  Neck: Normal range of motion. No tracheal deviation present. No thyromegaly present.  Cardiovascular: Normal rate and regular rhythm.   No murmur heard. Pulmonary/Chest: Effort normal. She has no wheezes. She has no rales.  Abdominal: Soft. Bowel  sounds are normal. She exhibits no distension. There is no tenderness.  Musculoskeletal: Normal range of motion. She exhibits no edema.  Neurological: She is alert and oriented to person, place, and time. No cranial nerve deficit.  Skin: Skin is warm and dry. No rash noted.  Psychiatric: She has a normal mood and affect. Her behavior is normal.          Assessment & Plan:

## 2012-10-21 NOTE — Assessment & Plan Note (Addendum)
Pt will finish prednisone taper on 12/3 (tomorrow). Pt had no recurrence of symptoms after discharge. Pt's symptoms occur less than once/month, and pt states that she typically only has asthma symptoms in association with URIs. No maintenance inhaler therapy indicated at this time.

## 2012-10-21 NOTE — Assessment & Plan Note (Signed)
Counseled on cessation. Patient states she is motivated to quit at this time.

## 2012-10-21 NOTE — Assessment & Plan Note (Signed)
Patient's BP slightly elevated today at 151/98. Likely 2/2 to prednisone usage (will complete taper tomorrow). Review of EMR suggests that patient often has moderately elevated BP, but that this typically has occurred when pt was not compliant with her medications. Pt states she is beginning exercise plan and will begin to eat healthier, and declines any escalation of her anti-HTN regimen at this time. Pt's BP will be re-assessed at fu in 6 months, and she will continue her lisinopril-HCTZ 20-12.5mg  at this time.

## 2012-10-21 NOTE — Patient Instructions (Addendum)
 DASH Diet The DASH diet stands for "Dietary Approaches to Stop Hypertension." It is a healthy eating plan that has been shown to reduce high blood pressure (hypertension) in as little as 14 days, while also possibly providing other significant health benefits. These other health benefits include reducing the risk of breast cancer after menopause and reducing the risk of type 2 diabetes, heart disease, colon cancer, and stroke. Health benefits also include weight loss and slowing kidney failure in patients with chronic kidney disease.  DIET GUIDELINES  Limit salt (sodium). Your diet should contain less than 1500 mg of sodium daily.  Limit refined or processed carbohydrates. Your diet should include mostly whole grains. Desserts and added sugars should be used sparingly.  Include small amounts of heart-healthy fats. These types of fats include nuts, oils, and tub margarine. Limit saturated and trans fats. These fats have been shown to be harmful in the body. CHOOSING FOODS  The following food groups are based on a 2000 calorie diet. See your Registered Dietitian for individual calorie needs. Grains and Grain Products (6 to 8 servings daily)  Eat More Often: Whole-wheat bread, brown rice, whole-grain or wheat pasta, quinoa, popcorn without added fat or salt (air popped).  Eat Less Often: White bread, white pasta, white rice, cornbread. Vegetables (4 to 5 servings daily)  Eat More Often: Fresh, frozen, and canned vegetables. Vegetables may be raw, steamed, roasted, or grilled with a minimal amount of fat.  Eat Less Often/Avoid: Creamed or fried vegetables. Vegetables in a cheese sauce. Fruit (4 to 5 servings daily)  Eat More Often: All fresh, canned (in natural juice), or frozen fruits. Dried fruits without added sugar. One hundred percent fruit juice ( cup [237 mL] daily).  Eat Less Often: Dried fruits with added sugar. Canned fruit in light or heavy syrup. Lean Meats, Fish, and Poultry  (2 servings or less daily. One serving is 3 to 4 oz [85-114 g]).  Eat More Often: Ninety percent or leaner ground beef, tenderloin, sirloin. Round cuts of beef, chicken breast, turkey breast. All fish. Grill, bake, or broil your meat. Nothing should be fried.  Eat Less Often/Avoid: Fatty cuts of meat, turkey, or chicken leg, thigh, or wing. Fried cuts of meat or fish. Dairy (2 to 3 servings)  Eat More Often: Low-fat or fat-free milk, low-fat plain or light yogurt, reduced-fat or part-skim cheese.  Eat Less Often/Avoid: Milk (whole, 2%).Whole milk yogurt. Full-fat cheeses. Nuts, Seeds, and Legumes (4 to 5 servings per week)  Eat More Often: All without added salt.  Eat Less Often/Avoid: Salted nuts and seeds, canned beans with added salt. Fats and Sweets (limited)  Eat More Often: Vegetable oils, tub margarines without trans fats, sugar-free gelatin. Mayonnaise and salad dressings.  Eat Less Often/Avoid: Coconut oils, palm oils, butter, stick margarine, cream, half and half, cookies, candy, pie. FOR MORE INFORMATION The Dash Diet Eating Plan: www.dashdiet.org Document Released: 10/26/2011 Document Revised: 01/29/2012 Document Reviewed: 10/26/2011 [employer] Patient Information 2013 [employer].   Asthma Attack Prevention HOW CAN ASTHMA BE PREVENTED? Currently, there is no way to prevent asthma from starting. However, you can take steps to control the disease and prevent its symptoms after you have been diagnosed. Learn about your asthma and how to control it. Take an active role to control your asthma by working with your caregiver to create and follow an asthma action plan. An asthma action plan guides you in taking your medicines properly, avoiding factors that make your asthma worse, tracking   your level of asthma control, responding to worsening asthma, and seeking emergency care when needed. To track your asthma, keep records of your symptoms, check your peak flow number using a  peak flow meter (handheld device that shows how well air moves out of your lungs), and get regular asthma checkups.  Other ways to prevent asthma attacks include:  Use medicines as your caregiver directs.  Identify and avoid things that make your asthma worse (as much as you can).  Keep track of your asthma symptoms and level of control.  Get regular checkups for your asthma.  With your caregiver, write a detailed plan for taking medicines and managing an asthma attack. Then be sure to follow your action plan. Asthma is an ongoing condition that needs regular monitoring and treatment.  Identify and avoid asthma triggers. A number of outdoor allergens and irritants (pollen, mold, cold air, air pollution) can trigger asthma attacks. Find out what causes or makes your asthma worse, and take steps to avoid those triggers (see below).  Monitor your breathing. Learn to recognize warning signs of an attack, such as slight coughing, wheezing or shortness of breath. However, your lung function may already decrease before you notice any signs or symptoms, so regularly measure and record your peak airflow with a home peak flow meter.  Identify and treat attacks early. If you act quickly, you're less likely to have a severe attack. You will also need less medicine to control your symptoms. When your peak flow measurements decrease and alert you to an upcoming attack, take your medicine as instructed, and immediately stop any activity that may have triggered the attack. If your symptoms do not improve, get medical help.  Pay attention to increasing quick-relief inhaler use. If you find yourself relying on your quick-relief inhaler (such as albuterol), your asthma is not under control. See your caregiver about adjusting your treatment. IDENTIFY AND CONTROL FACTORS THAT MAKE YOUR ASTHMA WORSE A number of common things can set off or make your asthma symptoms worse (asthma triggers). Keep track of your asthma  symptoms for several weeks, detailing all the environmental and emotional factors that are linked with your asthma. When you have an asthma attack, go back to your asthma diary to see which factor, or combination of factors, might have contributed to it. Once you know what these factors are, you can take steps to control many of them.  Allergies: If you have allergies and asthma, it is important to take asthma prevention steps at home. Asthma attacks (worsening of asthma symptoms) can be triggered by allergies, which can cause temporary increased inflammation of your airways. Minimizing contact with the substance to which you are allergic will help prevent an asthma attack. Animal Dander:   Some people are allergic to the flakes of skin or dried saliva from animals with fur or feathers. Keep these pets out of your home.  If you can't keep a pet outdoors, keep the pet out of your bedroom and other sleeping areas at all times, and keep the door closed.  Remove carpets and furniture covered with cloth from your home. If that is not possible, keep the pet away from fabric-covered furniture and carpets. Dust Mites:  Many people with asthma are allergic to dust mites. Dust mites are tiny bugs that are found in every home, in mattresses, pillows, carpets, fabric-covered furniture, bedcovers, clothes, stuffed toys, fabric, and other fabric-covered items.  Cover your mattress in a special dust-proof cover.  Cover your pillow in   a special dust-proof cover, or wash the pillow each week in hot water. Water must be hotter than 130 F to kill dust mites. Cold or warm water used with detergent and bleach can also be effective.  Wash the sheets and blankets on your bed each week in hot water.  Try not to sleep or lie on cloth-covered cushions.  Call ahead when traveling and ask for a smoke-free hotel room. Bring your own bedding and pillows, in case the hotel only supplies feather pillows and down comforters,  which may contain dust mites and cause asthma symptoms.  Remove carpets from your bedroom and those laid on concrete, if you can.  Keep stuffed toys out of the bed, or wash the toys weekly in hot water or cooler water with detergent and bleach. Cockroaches:  Many people with asthma are allergic to the droppings and remains of cockroaches.  Keep food and garbage in closed containers. Never leave food out.  Use poison baits, traps, powders, gels, or paste (for example, boric acid).  If a spray is used to kill cockroaches, stay out of the room until the odor goes away. Indoor Mold:  Fix leaky faucets, pipes, or other sources of water that have mold around them.  Clean moldy surfaces with a cleaner that has bleach in it. Pollen and Outdoor Mold:  When pollen or mold spore counts are high, try to keep your windows closed.  Stay indoors with windows closed from late morning to afternoon, if you can. Pollen and some mold spore counts are highest at that time.  Ask your caregiver whether you need to take or increase anti-inflammatory medicine before your allergy season starts. Irritants:   Tobacco smoke is an irritant. If you smoke, ask your caregiver how you can quit. Ask family members to quit smoking, too. Do not allow smoking in your home or car.  If possible, do not use a wood-burning stove, kerosene heater, or fireplace. Minimize exposure to all sources of smoke, including incense, candles, fires, and fireworks.  Try to stay away from strong odors and sprays, such as perfume, talcum powder, hair spray, and paints.  Decrease humidity in your home and use an indoor air cleaning device. Reduce indoor humidity to below 60 percent. Dehumidifiers or central air conditioners can do this.  Try to have someone else vacuum for you once or twice a week, if you can. Stay out of rooms while they are being vacuumed and for a short while afterward.  If you vacuum, use a dust mask from a hardware  store, a double-layered or microfilter vacuum cleaner bag, or a vacuum cleaner with a HEPA filter.  Sulfites in foods and beverages can be irritants. Do not drink beer or wine, or eat dried fruit, processed potatoes, or shrimp if they cause asthma symptoms.  Cold air can trigger an asthma attack. Cover your nose and mouth with a scarf on cold or windy days.  Several health conditions can make asthma more difficult to manage, including runny nose, sinus infections, reflux disease, psychological stress, and sleep apnea. Your caregiver will treat these conditions, as well.  Avoid close contact with people who have a cold or the flu, since your asthma symptoms may get worse if you catch the infection from them. Wash your hands thoroughly after touching items that may have been handled by people with a respiratory infection.  Get a flu shot every year to protect against the flu virus, which often makes asthma worse for days or   weeks. Also get a pneumonia shot once every five to 10 years. Drugs:  Aspirin and other painkillers can cause asthma attacks. 10% to 20% of people with asthma have sensitivity to aspirin or a group of painkillers called non-steroidal anti-inflammatory drugs (NSAIDS), such as ibuprofen and naproxen. These drugs are used to treat pain and reduce fevers. Asthma attacks caused by any of these medicines can be severe and even fatal. These drugs must be avoided in people who have known aspirin sensitive asthma. Products with acetaminophen are considered safe for people who have asthma. It is important that people with aspirin sensitivity read labels of all over-the-counter drugs used to treat pain, colds, coughs, and fever.  Beta blockers and ACE inhibitors are other drugs which you should discuss with your caregiver, in relation to your asthma. ALLERGY SKIN TESTING  Ask your asthma caregiver about allergy skin testing or blood testing (RAST test) to identify the allergens to which you  are sensitive. If you are found to have allergies, allergy shots (immunotherapy) for asthma may help prevent future allergies and asthma. With allergy shots, small doses of allergens (substances to which you are allergic) are injected under your skin on a regular schedule. Over a period of time, your body may become used to the allergen and less responsive with asthma symptoms. You can also take measures to minimize your exposure to those allergens. EXERCISE  If you have exercise-induced asthma, or are planning vigorous exercise, or exercise in cold, humid, or dry environments, prevent exercise-induced asthma by following your caregiver's advice regarding asthma treatment before exercising. Document Released: 10/25/2009 Document Revised: 01/29/2012 Document Reviewed: 10/25/2009 [employer] Patient Information 2013 [employer].  

## 2012-10-22 ENCOUNTER — Telehealth: Payer: Self-pay | Admitting: *Deleted

## 2012-10-22 NOTE — Telephone Encounter (Signed)
Pt calls and states she was not told why she needed to see a urologist, could you please call her and explain this, [phone]

## 2012-10-30 ENCOUNTER — Telehealth: Payer: Self-pay | Admitting: *Deleted

## 2012-10-30 NOTE — Telephone Encounter (Signed)
Pt calls and states earlier this week she was seen at the dentist and her bp was up, worried her, calls for appt, fri appt offered she states she cannot get off work any more this week, ask for wed the 18th, scheduled 12/18 1315 dr schooler per Charlynn Grimes. she is cautioned to take her meds as directed and if h/a, vision problems, n&v she is to go to ED or urg care or call office, she is agreeable.

## 2012-11-01 ENCOUNTER — Ambulatory Visit: Payer: BC Managed Care – PPO | Admitting: Internal Medicine

## 2012-11-01 ENCOUNTER — Ambulatory Visit: Payer: BC Managed Care – PPO | Admitting: Radiation Oncology

## 2012-11-06 ENCOUNTER — Ambulatory Visit: Payer: BC Managed Care – PPO | Admitting: Internal Medicine

## 2012-11-08 ENCOUNTER — Other Ambulatory Visit: Payer: Self-pay | Admitting: Radiation Oncology

## 2012-11-08 ENCOUNTER — Ambulatory Visit: Payer: BC Managed Care – PPO | Admitting: Internal Medicine

## 2012-11-08 DIAGNOSIS — I10 Essential (primary) hypertension: Secondary | ICD-10-CM

## 2012-11-08 MED ORDER — LISINOPRIL-HYDROCHLOROTHIAZIDE 20-25 MG PO TABS: 1.0000 | ORAL_TABLET | Freq: Every day | ORAL | Status: DC

## 2012-11-08 NOTE — Progress Notes (Signed)
Patient was seen on 10/21/2012 with complaints of BP ~150-160/90 at home. As she was finishing a prednisone taper, no changes were made in BP meds. Pt however calls back today to state that BP continues in 150-160/90 range, which is making her anxious. She cannot get in for an appointment for a couple of weeks, due to lack of transportation and holiday schedule. Although pt was reassured that this BP should not be dangerous, she is anxious about getting her BP down to within normal range, and is checking it daily.  - inc lisinopril-HCTZ from 20-12.5mg  to 20-25mg  - f/u as soon as possible for BMET and clinic visit

## 2012-11-21 ENCOUNTER — Ambulatory Visit: Payer: BC Managed Care – PPO | Admitting: Internal Medicine

## 2012-12-16 ENCOUNTER — Ambulatory Visit (INDEPENDENT_AMBULATORY_CARE_PROVIDER_SITE_OTHER): Payer: BC Managed Care – PPO | Admitting: Internal Medicine

## 2012-12-16 ENCOUNTER — Encounter: Payer: Self-pay | Admitting: Internal Medicine

## 2012-12-16 VITALS — BP 120/90 | HR 85 | Temp 98.1°F | Ht 60.0 in | Wt 208.3 lb

## 2012-12-16 DIAGNOSIS — Z0000 Encounter for general adult medical examination without abnormal findings: Secondary | ICD-10-CM | POA: Insufficient documentation

## 2012-12-16 DIAGNOSIS — J45901 Unspecified asthma with (acute) exacerbation: Secondary | ICD-10-CM

## 2012-12-16 DIAGNOSIS — I10 Essential (primary) hypertension: Secondary | ICD-10-CM

## 2012-12-16 NOTE — Assessment & Plan Note (Signed)
-  patient refused flu shot and tetanus shot. We'll postpone.

## 2012-12-16 NOTE — Progress Notes (Signed)
Patient ID: KHRYSTYNE ARPIN, female   DOB: 1966/05/16, 47 y.o.   MRN: [id number] Subjective:   Patient ID: MERCADES BAJAJ female   DOB: 30-Oct-1966 47 y.o.   MRN: [id number]  HPI:  Ms.Nafisa L Zucco is a 47 y.o.    with past medical history as outlined below, who presents for a followup visit.    1) Hypertension: Patient was seen in clinic at 10/21/12. His blood pressure medication was recently adjusted, her lisinopril-hydrochlorothiazide dosage was increased to 20-25 mg daily on 11/08/12. Patient reports that she missed her blood pressure medication doses for 2 or 3 days recently. Today her blood pressure is elevated at 145/98 mmHg when she come in and repeated Bp is 120/90 mmHg.  2) Asthma: Patient is currently on albuterol inhaler when necessary. She does not have shortness of breath, chest pain, cough, wheezing. She did not use her albuterol inhaler since she was discharged on 10/21/12. Patient continues to smoke, 3 cigarettes per day. She's not ready to quit smoking yet.   Past Medical History  Diagnosis Date  . Tobacco abuse   . Asthma   . Hypertension   . Headache   . Complication of anesthesia     CAUSES ANXIETY   Current Outpatient Prescriptions  Medication Sig Dispense Refill  . albuterol (PROVENTIL HFA;VENTOLIN HFA) 108 (90 BASE) MCG/ACT inhaler Inhale 2 puffs into the lungs every 6 (six) hours as needed. SOB/Wheezing      . guaiFENesin (MUCINEX) 600 MG 12 hr tablet Take 1,200 mg by mouth 2 (two) times daily.      Marland Kitchen lisinopril-hydrochlorothiazide (PRINZIDE,ZESTORETIC) 20-25 MG per tablet Take 1 tablet by mouth daily.  30 tablet  1   Family History  Problem Relation Age of Onset  . Diabetes Mother   . Hypertension Mother   . Stroke Father   . Heart disease Father     died <47 y.o  . Diabetes Maternal Aunt   . Cancer Maternal Aunt     breast   . Heart disease Maternal Grandmother   . Arthritis Mother   . Hyperlipidemia Mother   . Asthma Maternal Aunt     History   Social History  . Marital Status: Single    Spouse Name: N/A    Number of Children: 3  . Years of Education: some coll.   Occupational History  . Claims processing    Social History Main Topics  . Smoking status: Former Smoker -- 0.1 packs/day for 30 years    Types: Cigarettes    Quit date: 10/12/2012  . Smokeless tobacco: Never Used     Comment: cutting back  . Alcohol Use: Yes     Comment: socially  . Drug Use: No  . Sexually Active: Yes   Other Topics Concern  . None   Social History Narrative   Lives in [place] alone.    Review of Systems: General: no fevers, chills, no changes in body weight, no changes in appetite Skin: no rash HEENT: no blurry vision, hearing changes or sore throat Pulm: no dyspnea, coughing, wheezing CV: no chest pain, palpitations, shortness of breath Abd: no nausea/vomiting, abdominal pain, diarrhea/constipation GU: no dysuria, hematuria, polyuria Ext: no arthralgias, myalgias Neuro: no weakness, numbness, or tingling  Objective:  Physical Exam: Filed Vitals:   12/16/12 1338  BP: 145/98  Pulse: 85  Temp: 98.1 F (36.7 C)  TempSrc: Oral  Height: 5' (1.524 m)  Weight: 208 lb 4.8 oz (94.484 kg)  SpO2: 97%  General: Not in acute distress HEENT: PERRL, EOMI, no scleral icterus, No bruit or JVD Cardiac: S1/S2, RRR, No murmurs, gallops or rubs Pulm: Good air movement bilaterally, Clear to auscultation bilaterally, No rales, wheezing, rhonchi or rubs. Abd: Soft,  nondistended, nontender, no rebound pain, no organomegaly, BS present Ext: No rashes or edema, 2+DP/PT pulse bilaterally Musculoskeletal: No joint deformities, erythema, or stiffness, ROM full and nontender Skin: no rashes. No skin bruise. Neuro: Alert and oriented X3, cranial nerves II-XII grossly intact, muscle strength 5/5 in all extremeties,  sensation to light touch intact. Brachial reflexes 1+ bilaterally. Knee reflexes 2+ bilaterally.  Psych:  patient is not psychotic, no suicidal or hemocidal ideation.    Assessment & Plan:

## 2012-12-16 NOTE — Assessment & Plan Note (Signed)
BP Readings from Last 3 Encounters:  12/16/12 120/90  10/21/12 151/98  10/10/12 163/107    Lab Results  Component Value Date   NA 140 10/09/2012   K 4.5 10/09/2012   CREATININE 0.73 10/09/2012    Assessment:  Blood pressure control: mildly elevated  Progress toward BP goal:  improved  Comments:   Plan:  Medications:  continue current medications  Educational resources provided: brochure  Self management tools provided: home blood pressure logbook  Other plans: Patient's blood pressure medication dosage was recently adjusted. Currently patient to take Prinzide 20-25 mg daily. Today blood pressure is better controlled. Will continue current regimen.

## 2012-12-16 NOTE — Assessment & Plan Note (Addendum)
It is stable. There is no signs of acute exacerbation. Patient's lung auscultation is clear bilaterally. Wiil continue current regimen.

## 2012-12-16 NOTE — Patient Instructions (Signed)
1. Please take your medications regularly. It is very important to take your blood pressure medications every day. 3. If you have worsening of your symptoms or new symptoms arise, please call the clinic ([phone]), or go to the ER immediately if symptoms are severe.  Hypertension As your heart beats, it forces blood through your arteries. This force is your blood pressure. If the pressure is too high, it is called hypertension (HTN) or high blood pressure. HTN is dangerous because you may have it and not know it. High blood pressure may mean that your heart has to work harder to pump blood. Your arteries may be narrow or stiff. The extra work puts you at risk for heart disease, stroke, and other problems.  Blood pressure consists of two numbers, a higher number over a lower, 110/72, for example. It is stated as "110 over 72." The ideal is below 120 for the top number (systolic) and under 80 for the bottom (diastolic). Write down your blood pressure today. You should pay close attention to your blood pressure if you have certain conditions such as:  Heart failure.  Prior heart attack.  Diabetes  Chronic kidney disease.  Prior stroke.  Multiple risk factors for heart disease. To see if you have HTN, your blood pressure should be measured while you are seated with your arm held at the level of the heart. It should be measured at least twice. A one-time elevated blood pressure reading (especially in the Emergency Department) does not mean that you need treatment. There may be conditions in which the blood pressure is different between your right and left arms. It is important to see your caregiver soon for a recheck. Most people have essential hypertension which means that there is not a specific cause. This type of high blood pressure may be lowered by changing lifestyle factors such as:  Stress.  Smoking.  Lack of exercise.  Excessive weight.  Drug/tobacco/alcohol use.  Eating less  salt. Most people do not have symptoms from high blood pressure until it has caused damage to the body. Effective treatment can often prevent, delay or reduce that damage. TREATMENT  When a cause has been identified, treatment for high blood pressure is directed at the cause. There are a large number of medications to treat HTN. These fall into several categories, and your caregiver will help you select the medicines that are best for you. Medications may have side effects. You should review side effects with your caregiver. If your blood pressure stays high after you have made lifestyle changes or started on medicines,   Your medication(s) may need to be changed.  Other problems may need to be addressed.  Be certain you understand your prescriptions, and know how and when to take your medicine.  Be sure to follow up with your caregiver within the time frame advised (usually within two weeks) to have your blood pressure rechecked and to review your medications.  If you are taking more than one medicine to lower your blood pressure, make sure you know how and at what times they should be taken. Taking two medicines at the same time can result in blood pressure that is too low. SEEK IMMEDIATE MEDICAL CARE IF:  You develop a severe headache, blurred or changing vision, or confusion.  You have unusual weakness or numbness, or a faint feeling.  You have severe chest or abdominal pain, vomiting, or breathing problems. MAKE SURE YOU:   Understand these instructions.  Will watch your condition.  Will get help right away if you are not doing well or get worse. Document Released: 11/06/2005 Document Revised: 01/29/2012 Document Reviewed: 06/26/2008 [facility] Patient Information 2013 [place], [place].

## 2013-02-24 ENCOUNTER — Telehealth: Payer: Self-pay | Admitting: *Deleted

## 2013-02-24 ENCOUNTER — Encounter: Payer: Self-pay | Admitting: Internal Medicine

## 2013-02-24 NOTE — Telephone Encounter (Signed)
Pt calls and states for appr 1 month she has been very depressed, crying "all day", feeling down, even wanting to commit suicide at times and most recently in the last week. She returned to work today from a time off suggested by her supervisor, she saw an Therapist, occupational this am and she suggested that pt see someone such a psychiatrist or pcp for possibly prescribed medication. Pt expresses a concern over the stigma of being seen for depression and how "people" hold it against one for "being like that". Called dr Midwife and relayed pt's statements, she will see pt 4/8 in the am unless unforeseen circumstances arise

## 2013-02-24 NOTE — Telephone Encounter (Signed)
Appt on my calendar.

## 2013-02-24 NOTE — Addendum Note (Signed)
Addended by: Neomia Dear on: 02/24/2013 04:40 PM   Modules accepted: Orders

## 2013-02-25 ENCOUNTER — Ambulatory Visit (INDEPENDENT_AMBULATORY_CARE_PROVIDER_SITE_OTHER): Payer: BC Managed Care – PPO | Admitting: Internal Medicine

## 2013-02-25 ENCOUNTER — Encounter: Payer: Self-pay | Admitting: Internal Medicine

## 2013-02-25 VITALS — BP 141/98 | HR 73 | Temp 98.2°F | Ht 61.0 in | Wt 205.4 lb

## 2013-02-25 DIAGNOSIS — F329 Major depressive disorder, single episode, unspecified: Secondary | ICD-10-CM

## 2013-02-25 DIAGNOSIS — F17200 Nicotine dependence, unspecified, uncomplicated: Secondary | ICD-10-CM

## 2013-02-25 DIAGNOSIS — I10 Essential (primary) hypertension: Secondary | ICD-10-CM

## 2013-02-25 DIAGNOSIS — Z720 Tobacco use: Secondary | ICD-10-CM

## 2013-02-25 HISTORY — DX: Major depressive disorder, single episode, unspecified: F32.9

## 2013-02-25 MED ORDER — BUPROPION HCL ER (XL) 150 MG PO TB24: 150.0000 mg | ORAL_TABLET | ORAL | Status: DC

## 2013-02-25 MED ORDER — ZOLPIDEM TARTRATE 10 MG PO TABS: 5.0000 mg | ORAL_TABLET | Freq: Every evening | ORAL | Status: DC | PRN

## 2013-02-25 NOTE — Assessment & Plan Note (Signed)
  Assessment: Progress toward smoking cessation:    Barriers to progress toward smoking cessation:    Comments: She has coped with depression by increasing her tobacco usage from 1 cig per day but is now back down to 1 cig and hopes to cont at 1 or none cig per day.   Plan: Instruction/counseling given:  I advised patient to stop smoking. Educational resources provided:    Self management tools provided:    Medications to assist with smoking cessation:  None Patient agreed to the following self-care plans for smoking cessation:    Other plans: We did start bupropion so that might help. But already back down to 1 cig per day.

## 2013-02-25 NOTE — Progress Notes (Signed)
Rx for generic Ambien called in to [facility] as directed per Dr Rogelia Boga. Stanton Kidney Bridey Brookover RN 02/25/13 10:50AM

## 2013-02-25 NOTE — Assessment & Plan Note (Signed)
BP Readings from Last 3 Encounters:  02/25/13 141/98  12/16/12 120/90  10/21/12 151/98    Lab Results  Component Value Date   NA 140 10/09/2012   K 4.5 10/09/2012   CREATININE 0.73 10/09/2012    Assessment: Blood pressure control: mildly elevated Progress toward BP goal:  deteriorated Comments: Elevated today but distressed so likely falsely elevated. She also states that she is claustrophobic so the BP cuff causes additional distress.  Plan: Medications:  continue current medications Educational resources provided:  (Visit only for depression today. Address next appt) Self management tools provided:  (Visit only for depression today. Address next appt) Other plans: Will recheck in 2-3 weeks.

## 2013-02-25 NOTE — Assessment & Plan Note (Signed)
Ms Buttery was sch as an acute visit to discuss her depression. It has been present about 4 weeks, increased in intensity about 1.5 weeks ago when she had a "meltdown" that required a short respite from her work, and now has slightly improved although she is not yet at baseline. Her sxs incl decreased concentration (affecting her work, driving, etc), decreased appetite (doesn't remember to eat, able to fit into old pants), insomnia (used a benzo that her friend gave her - helps her to relax but wakes up every 2-3 hrs and thinks about a lot), depressed mood (a lot of crying), and fatigue (and hurts all over). These sxs have occurred daily over past 4 weeks and is sig affecting her ability to live her life. She describes no sxs of mania or hypomania.   Life stressors seemed to bring this episode on and included her son getting kicked out of student housing bc he leant a friend money. She had to lend the son money. Her landlord was to sell her condo and she had to scramble to find new housing and is still not in a suitable home situation yet. Family, other than son and daughter, were not supportive during this time.   She has had adversity in past (depression episodes) that never required medical tx bc she was able to rely on her strength and religion but this episode is worse than prior episodes.   She saw a counselor at work yesterday and she was told to get an appt with her PCP to discuss medical tx.   She has coped by increasing her tobacco usage from 1 cig per day but is now back down to 1 cig and hopes to cont at 1 or none cig per day. She has not relied on ETOH at all to cope.   Our plan: 1. Ambien short term to sleep - I discussed taking it in bed and staying in bed. Explained increased fall risk and to use caution after taking it and the small risk or altered behavior while on the med. Take on empty stomach and avoid fatty meals prior to pill. 5 mg but can increase to 10 mg if needed. She agrees  to short term only as doesn't want to be on long term.  2. Antidepressant - We went over side effect profile of several meds and selected Wellbutrin bc of neutral effects on weight and few sexual side effects. She will start 150 QAM (long acting) and increase to 300 QAM. Once needs refill, I will Rx 300 mg pill. We went over side effects, inc but not limited to sexual, increase suicide risk, GI, sleep, and anticholinergic. We discussed that she will need at least 6 months and then we can determine whether to titrate off or cont indefinitely. I discussed that with her prior depression (untreated) episodes she is at high risk for more episodes and relapse but left open the possibility of coming off med.   She will call if needs anything urgently. To see me in 2-3 weeks for F/U.

## 2013-02-25 NOTE — Patient Instructions (Signed)
See me in 2 -4 weeks to check in depression

## 2013-02-25 NOTE — Progress Notes (Signed)
  Subjective:    Patient ID: Crystal English, female    DOB: 10-11-1966, 47 y.o.   MRN: [id number]  HPI  Please see the A&P for the status of the pt's chronic medical problems. Today was my first appt with Crystal English and it was an acute visit to address depression.  Chronic issues only briefly addressed and will be fully addressed next appt.  Review of Systems  Constitutional: Positive for activity change, appetite change and unexpected weight change.  Musculoskeletal: Positive for myalgias.       Neck pain. Aches everywhere  Psychiatric/Behavioral: Positive for behavioral problems, confusion, sleep disturbance, dysphoric mood and decreased concentration. Negative for suicidal ideas and self-injury.       Objective:   Physical Exam  Constitutional: She is oriented to person, place, and time. She appears well-developed and well-nourished. No distress.  HENT:  Head: Normocephalic and atraumatic.  Right Ear: External ear normal.  Left Ear: External ear normal.  Nose: Nose normal.  Eyes: Conjunctivae are normal.  Musculoskeletal: Normal range of motion.  Neurological: She is alert and oriented to person, place, and time.  Skin: Skin is warm and dry. She is not diaphoretic.  Psychiatric: She has a normal mood and affect. Her behavior is normal. Judgment and thought content normal.          Assessment & Plan:

## 2013-02-28 ENCOUNTER — Other Ambulatory Visit: Payer: Self-pay | Admitting: Internal Medicine

## 2013-02-28 ENCOUNTER — Telehealth: Payer: Self-pay | Admitting: *Deleted

## 2013-02-28 MED ORDER — ZOLPIDEM TARTRATE ER 12.5 MG PO TBCR: 12.5000 mg | EXTENDED_RELEASE_TABLET | Freq: Every evening | ORAL | Status: DC | PRN

## 2013-02-28 NOTE — Progress Notes (Signed)
Ambien CR called to [facility] on [address].

## 2013-02-28 NOTE — Telephone Encounter (Signed)
Rx called in by [facility]

## 2013-02-28 NOTE — Progress Notes (Signed)
First night with Ambien. Took at 8 PM and fell asleep immediately. Woke up at 3:18. So 7 hours sleep  Second night. Took at 9:30 and fell asleep immediately. Woke up 2 AM. So 4.5 hrs sleep.  Will do trial of Ambien CR. If no better, trial of restoril.

## 2013-02-28 NOTE — Telephone Encounter (Signed)
Pt called and stated that the Ambien was not working for her.  Med started on 4/8. She is not sleeping at all. Med taken at 9:30 on empty stomach.  She took Ambien 10 mg both nights. ( she forgot to cut in half ) Pt states she can't keep going without sleep.  Pt # [id number]

## 2013-02-28 NOTE — Telephone Encounter (Signed)
Pls call in the Ambien CR to [facility]. Pls see my orders encounter

## 2013-03-04 ENCOUNTER — Telehealth: Payer: Self-pay | Admitting: *Deleted

## 2013-03-04 NOTE — Telephone Encounter (Signed)
Pt called in today to report sleeping pattern on Ambien CR. Friday night = 7 hours sleep Saturday night = 4.5 hours sleep Sunday night slept well Monday she was up at 3:00, she needs to be up at 5:15 so very little sleep. She wants to know if she should try something else, she states she needs her sleep.  Pt # [id number]

## 2013-03-05 NOTE — Telephone Encounter (Signed)
Talked with pt and she will stay with Ambien over the week end.  She will call back on Monday and if sleep has not improved will ask that Restoril be called in.

## 2013-03-05 NOTE — Telephone Encounter (Signed)
I am on inpt so am not going to be able to call her this time. Would you pls reinforce sleep hygiene (go to bed same time, wake up same time, turn off electronics 2 hours before bed, no caffeine after late afternoon). It seems as if the Ambien CR is working, at least 50% of time. I would encourage her to cont to use it and stress the sleep hygiene. If that is not a suitable option, then I would try Restoril 15 mg PO QHS #30, no RF. If she doesn't want to cont with Ambien CR, then you can call in the Restoril and let me know and I will then enter the order. Thanks

## 2013-03-11 ENCOUNTER — Emergency Department (HOSPITAL_COMMUNITY): Payer: BC Managed Care – PPO

## 2013-03-11 ENCOUNTER — Telehealth: Payer: Self-pay | Admitting: *Deleted

## 2013-03-11 ENCOUNTER — Ambulatory Visit (INDEPENDENT_AMBULATORY_CARE_PROVIDER_SITE_OTHER): Payer: BC Managed Care – PPO | Admitting: Internal Medicine

## 2013-03-11 ENCOUNTER — Emergency Department (HOSPITAL_COMMUNITY)
Admission: EM | Admit: 2013-03-11 | Discharge: 2013-03-11 | Disposition: A | Discharge status: Home / Self Care | Payer: BC Managed Care – PPO | Attending: Emergency Medicine | Admitting: Emergency Medicine

## 2013-03-11 ENCOUNTER — Encounter (HOSPITAL_COMMUNITY): Payer: Self-pay | Admitting: Emergency Medicine

## 2013-03-11 ENCOUNTER — Other Ambulatory Visit: Payer: Self-pay

## 2013-03-11 ENCOUNTER — Encounter: Payer: Self-pay | Admitting: Internal Medicine

## 2013-03-11 DIAGNOSIS — Z79899 Other long term (current) drug therapy: Secondary | ICD-10-CM | POA: Insufficient documentation

## 2013-03-11 DIAGNOSIS — I10 Essential (primary) hypertension: Secondary | ICD-10-CM | POA: Insufficient documentation

## 2013-03-11 DIAGNOSIS — F329 Major depressive disorder, single episode, unspecified: Secondary | ICD-10-CM | POA: Insufficient documentation

## 2013-03-11 DIAGNOSIS — R0789 Other chest pain: Secondary | ICD-10-CM | POA: Insufficient documentation

## 2013-03-11 DIAGNOSIS — G4733 Obstructive sleep apnea (adult) (pediatric): Secondary | ICD-10-CM

## 2013-03-11 DIAGNOSIS — M546 Pain in thoracic spine: Secondary | ICD-10-CM | POA: Insufficient documentation

## 2013-03-11 DIAGNOSIS — R079 Chest pain, unspecified: Secondary | ICD-10-CM | POA: Insufficient documentation

## 2013-03-11 DIAGNOSIS — Z87891 Personal history of nicotine dependence: Secondary | ICD-10-CM | POA: Insufficient documentation

## 2013-03-11 DIAGNOSIS — J45909 Unspecified asthma, uncomplicated: Secondary | ICD-10-CM | POA: Insufficient documentation

## 2013-03-11 DIAGNOSIS — G43909 Migraine, unspecified, not intractable, without status migrainosus: Secondary | ICD-10-CM | POA: Insufficient documentation

## 2013-03-11 LAB — BASIC METABOLIC PANEL
BUN: 8 mg/dL (ref 6–23)
CO2: 25 mEq/L (ref 19–32)
Chloride: 102 mEq/L (ref 96–112)
GFR calc Af Amer: >90 mL/min (ref >90)
Glucose, Bld: 79 mg/dL (ref 70–99)
Potassium: 3.9 mEq/L (ref 3.5–5.1)

## 2013-03-11 LAB — POCT I-STAT TROPONIN I: Troponin i, poc: 0 ng/mL (ref 0.00–0.08)

## 2013-03-11 LAB — CBC
HCT: 41.9 % (ref 36.0–46.0)
Hemoglobin: 14.2 g/dL (ref 12.0–15.0)
RBC: 4.67 MIL/uL (ref 3.87–5.11)
WBC: 6.6 10*3/uL (ref 4.0–10.5)

## 2013-03-11 MED ORDER — METOCLOPRAMIDE HCL 5 MG/ML IJ SOLN
10.0000 mg | Freq: Once | INTRAMUSCULAR | Status: AC
  Administered 2013-03-11: 10 mg via INTRAVENOUS
  Filled 2013-03-11: qty 2

## 2013-03-11 MED ORDER — SODIUM CHLORIDE 0.9 % IV BOLUS (SEPSIS)
1000.0000 mL | Freq: Once | INTRAVENOUS | Status: AC
  Administered 2013-03-11: 1000 mL via INTRAVENOUS

## 2013-03-11 MED ORDER — KETOROLAC TROMETHAMINE 30 MG/ML IJ SOLN
30.0000 mg | Freq: Once | INTRAMUSCULAR | Status: AC
  Administered 2013-03-11: 30 mg via INTRAVENOUS
  Filled 2013-03-11: qty 1

## 2013-03-11 NOTE — Telephone Encounter (Signed)
Pt calls and states she has neck pain, shoulder pain and h/a x 3 days, she has taken excedrin migraine and ibuprofen with little improvement, hard to function but she is working, she would like to be seen for eval and possible tx. appt dr Bosie Clos 4/22 1545

## 2013-03-11 NOTE — ED Notes (Signed)
Pt woke up Sunday with left sided neck pain that radiated into her head, pt with hx of migraines- took Excedrin migraine medication without relief.  Headache and neck pain have been persistent since Sunday, today at work pt began having generalized chest pain with occasional sharp pains to the left chest- denies SOB with pain.  Admits to nausea, denies any vomiting.  Pt states she has been stressed out lately and wonders if that could be contributing to the pain.  Pt stopped smoking cigarettes earlier this week, states she usually smokes about 4 cigarettes/day.  Alert and oriented at present, denies any chest pain at present, will continue to monitor pt.

## 2013-03-11 NOTE — ED Notes (Signed)
Pt here with neck back and shoulder pain and then developed headache, then had chest pain that has now resolved

## 2013-03-11 NOTE — Telephone Encounter (Signed)
Agree. Pt has arrived

## 2013-03-11 NOTE — ED Provider Notes (Signed)
Medical screening examination/treatment/procedure(s) were performed by non-physician practitioner and as supervising physician I was immediately available for consultation/collaboration.    Celene Kras, MD 03/11/13 [phone]

## 2013-03-11 NOTE — ED Provider Notes (Signed)
History     CSN: [id number]  Arrival date & time 03/11/13  1645   First MD Initiated Contact with Patient 03/11/13 1833      Chief Complaint  Patient presents with  . Chest Pain  . Headache    (Consider location/radiation/quality/duration/timing/severity/associated sxs/prior treatment) HPI Patient presents emergency department with a two-day history of headache.  Patient, states, that she also developed chest pain, earlier in the day that seemed to be sharp in nature.as sharp pain that lasts for a few seconds, but then would have some dull pain that lasted for few minutes.  Patient denies nausea, vomiting, diarrhea, abdominal pain, back pain, fever, cough, blurred vision, weakness, numbness, dizziness, or syncope.  Patient also denies shortness of breath.  Patient, states she took medication for her headache, with minimal relief.patient states nothing seems to make her chest pain, better or worse Past Medical History  Diagnosis Date  . Tobacco abuse   . Asthma     Required inhaled corticosteroid in past.   . Hypertension     2 drug therapy  . Severe obesity (BMI >= 40) 10/08/2012    Obesity Class 3. BMI > 40.   Marland Kitchen OSA (obstructive sleep apnea)     Sleep study 12/2006 : Moderately Severe OSA. AHI 38.8. O2 sat decreased to 71%.  . 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 Prior episodes that did not require pt to seek medical tx.    Past Surgical History  Procedure Laterality Date  . Laparotomy      for tubal pregnancy  . Tubal ligation    . Dilation and curettage of uterus    . Ectopic pregnancy surgery      Family History  Problem Relation Age of Onset  . Diabetes Mother   . Hypertension Mother   . Stroke Father   . Heart disease Father     died <47 y.o  . Diabetes Maternal Aunt   . Cancer Maternal Aunt     breast   . Heart disease Maternal Grandmother   . Arthritis Mother   . Hyperlipidemia Mother   . Asthma Maternal Aunt     History   Substance Use Topics  . Smoking status: Former Smoker -- 0.15 packs/day for 30 years    Types: Cigarettes    Quit date: 10/12/2012  . Smokeless tobacco: Never Used     Comment: cutting back  . Alcohol Use: Yes     Comment: socially    OB History   Grav Para Term Preterm Abortions TAB SAB Ect Mult Living                  Review of Systems All other systems negative except as documented in the HPI. All pertinent positives and negatives as reviewed in the HPI. Allergies  Review of patient's allergies indicates no known allergies.  Home Medications   Current Outpatient Rx  Name  Route  Sig  Dispense  Refill  . albuterol (PROVENTIL HFA;VENTOLIN HFA) 108 (90 BASE) MCG/ACT inhaler   Inhalation   Inhale 2 puffs into the lungs every 6 (six) hours as needed. SOB/Wheezing         . buPROPion (WELLBUTRIN XL) 150 MG 24 hr tablet   Oral   Take 150 mg by mouth daily.         Marland Kitchen lisinopril-hydrochlorothiazide (PRINZIDE,ZESTORETIC) 20-25 MG per tablet   Oral   Take 1 tablet by mouth daily.   30 tablet  1   . zolpidem (AMBIEN CR) 12.5 MG CR tablet   Oral   Take 1 tablet (12.5 mg total) by mouth at bedtime as needed for sleep. Do not take with the plain Ambien.   30 tablet   1     BP 130/85  Pulse 67  Temp(Src) 99.1 F (37.3 C) (Oral)  Resp 12  SpO2 99%  Physical Exam  Nursing note and vitals reviewed. Constitutional: She is oriented to person, place, and time. She appears well-developed and well-nourished. No distress.  HENT:  Head: Normocephalic and atraumatic.  Mouth/Throat: Oropharynx is clear and moist. No oropharyngeal exudate.  Eyes: EOM are normal. Pupils are equal, round, and reactive to light.  Neck: Normal range of motion. Neck supple.  Cardiovascular: Normal rate, regular rhythm and normal heart sounds.  Exam reveals no gallop and no friction rub.   No murmur heard. Pulmonary/Chest: Effort normal and breath sounds normal. No respiratory distress. She has  no wheezes.  Abdominal: Soft. Bowel sounds are normal. She exhibits no distension. There is no tenderness. There is no guarding.  Neurological: She is alert and oriented to person, place, and time.  Skin: Skin is warm and dry. No rash noted. No erythema.    ED Course  Procedures (including critical care time)  Labs Reviewed  BASIC METABOLIC PANEL - Abnormal; Notable for the following:    GFR calc non Af Amer 81 (*)    All other components within normal limits  CBC  POCT I-STAT TROPONIN I  POCT I-STAT TROPONIN I   Dg Chest 2 View  03/11/2013  *RADIOLOGY REPORT*  Clinical Data: Left-sided chest pain which radiates into the neck.  CHEST - 2 VIEW  Comparison: Chest x-ray dated 10/08/2012  Findings: Heart size and pulmonary vascularity are normal and the lungs are clear.  No effusions.  Slight tortuosity of the thoracic aorta.  No osseous abnormality.  IMPRESSION: No acute disease.   Original Report Authenticated By: Francene Boyers, M.D.       MDM  MDM Reviewed: vitals, nursing note and previous chart Interpretation: labs, ECG and x-ray   Date: 03/11/2013  Rate: 68  Rhythm: normal sinus rhythm  QRS Axis: normal  Intervals: normal  ST/T Wave abnormalities: normal  Conduction Disutrbances:none  Narrative Interpretation:   Old EKG Reviewed: unchanged  Patient most likely is non-having cardiac chest pain, based on her HPI and physical exam findings. patient will be referred back to her primary care Dr. For further evaluation. patient has 2 sets of negative troponins with a normal EKG.  Patient is PERC negative          Carlyle Dolly, PA-C 03/11/13 2322

## 2013-03-11 NOTE — Progress Notes (Signed)
  Subjective:    Patient ID: Crystal English, female    DOB: 11/27/1965, 47 y.o.   MRN: [id number]  HPI  Present to clinic with active chest pain. Briefly evaluated by MD and sent to ED for further evaluation of left sided pressure type chest pain under her left breast for several hours preceded by right arm numbness.   Review of Systems  Constitutional: Negative for fever.  Respiratory: Positive for chest tightness. Negative for shortness of breath.   Cardiovascular: Positive for chest pain.  Neurological: Positive for numbness and headaches.       Objective:   Physical Exam Not able to fully exam before ED although pt appeared in mild distress, no diaphoresis, pointing under her left breat where pain persists      Assessment & Plan:  1. Chest Pain: pt sent emergently to ED for further evaluation

## 2013-03-13 NOTE — Progress Notes (Signed)
Dr. Bosie Clos informed me that Crystal English presented to the clinic with a complaint of chest pain and was sent immediately to the ED for further evaluation before she could be evaluated in the clinic.

## 2013-03-20 ENCOUNTER — Ambulatory Visit: Payer: BC Managed Care – PPO | Admitting: Internal Medicine

## 2013-04-01 ENCOUNTER — Telehealth: Payer: Self-pay | Admitting: *Deleted

## 2013-04-01 ENCOUNTER — Encounter: Payer: Self-pay | Admitting: Internal Medicine

## 2013-04-01 ENCOUNTER — Ambulatory Visit (INDEPENDENT_AMBULATORY_CARE_PROVIDER_SITE_OTHER): Payer: BC Managed Care – PPO | Admitting: Internal Medicine

## 2013-04-01 VITALS — BP 141/102 | HR 85 | Temp 98.0°F | Resp 20 | Ht 61.25 in | Wt 206.2 lb

## 2013-04-01 DIAGNOSIS — F17200 Nicotine dependence, unspecified, uncomplicated: Secondary | ICD-10-CM

## 2013-04-01 DIAGNOSIS — J45909 Unspecified asthma, uncomplicated: Secondary | ICD-10-CM

## 2013-04-01 DIAGNOSIS — I10 Essential (primary) hypertension: Secondary | ICD-10-CM

## 2013-04-01 DIAGNOSIS — Z720 Tobacco use: Secondary | ICD-10-CM

## 2013-04-01 MED ORDER — PREDNISONE 20 MG PO TABS: 40.0000 mg | ORAL_TABLET | Freq: Every day | ORAL | Status: DC

## 2013-04-01 MED ORDER — ALBUTEROL SULFATE HFA 108 (90 BASE) MCG/ACT IN AERS: 2.0000 | INHALATION_SPRAY | Freq: Four times a day (QID) | RESPIRATORY_TRACT | Status: DC | PRN

## 2013-04-01 NOTE — Patient Instructions (Addendum)
Stop smoking !!!!  In his shortness of breath and wheezing worsen him to have fevers and chills and productive cough please go to the emergency room as soon as possible.   Please keep a log book with peak flow : You need to have 450

## 2013-04-01 NOTE — Assessment & Plan Note (Signed)
Blood pressure today significantly elevated. This is most likely in this setting of acute asthma exacerbation as well as antihistamine use. Patient denies any chest pain headaches or any other symptoms. I'll reevaluate patient in one week. I'll not taking any changes in medication regimen. Will continue Prinzide 20/25 g daily

## 2013-04-01 NOTE — Assessment & Plan Note (Signed)
Most likely patient is experiencing an asthma exacerbation. I will start patient on prednisone 40 mg daily for 5 days and albuterol inhaler as needed. The patient was instructed to take the medication on a daily basis and if she is experiencing worsening symptoms along with fevers and chills she should be evaluated at the emergency room as soon as possible. After the acute event patient should be referred to for a pulmonary function test and possible consideration for any inhaled steroids. Patient was also given a peak flow meter. During this office visit it was 200. She should be 459 based on weight and high and age.

## 2013-04-01 NOTE — Progress Notes (Signed)
Peak Flow results - 270, 270, 200. Dorie Rank, RN, 04/01/13, 5PM

## 2013-04-01 NOTE — Progress Notes (Signed)
Subjective:   Patient ID: Crystal English female   DOB: [birth year] 47 y.o.   MRN: [id number]  HPI: [name] is a 47 y.o. female with past medical history significant as outlined below who presented to the clinic with 2-3 weeks of nonproductive cough. She has been having since at least January on and off episodes of wheezing in cough spells. She notice any significant trigger factors. But the last 2-3 weeks she has been having cough, shortness of breath and wheezing on a daily basis with episodes without any symptoms. At one time it was bad worse at that her godmother gave her an albuterol inhaler and she felt  a lot better. She also tried multiple over the counter antihistamines thinking this is due to allergies without any significant improvement. She denies any fevers or chills, denies any sputum production but noted that while per family members was diagnosed with pneumonia recently. Patient has been admitted for asthma exacerbation once a year the last one was in November of 2013. She has not used any inhaled corticosteroid recently but had quieted in the past.   Past Medical History  Diagnosis Date  . Tobacco abuse   . Asthma     Required inhaled corticosteroid in past.   . Hypertension     2 drug therapy  . Severe obesity (BMI >= 40) 10/08/2012    Obesity Class 3. BMI > 40.   Marland Kitchen OSA (obstructive sleep apnea)     Sleep study 12/2006 : Moderately Severe OSA. AHI 38.8. O2 sat decreased to 71%.  . 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 Prior episodes that did not require pt to seek medical tx.   Current Outpatient Prescriptions  Medication Sig Dispense Refill  . albuterol (PROVENTIL HFA;VENTOLIN HFA) 108 (90 BASE) MCG/ACT inhaler Inhale 2 puffs into the lungs every 6 (six) hours as needed for wheezing or shortness of breath. SOB/Wheezing  1 Inhaler  0  . lisinopril-hydrochlorothiazide (PRINZIDE,ZESTORETIC) 20-25 MG per tablet Take 1 tablet by  mouth daily.  30 tablet  1   No current facility-administered medications for this visit.   Family History  Problem Relation Age of Onset  . Diabetes Mother   . Hypertension Mother   . Stroke Father   . Heart disease Father     died <47 y.o  . Diabetes Maternal Aunt   . Cancer Maternal Aunt     breast   . Heart disease Maternal Grandmother   . Arthritis Mother   . Hyperlipidemia Mother   . Asthma Maternal Aunt    History   Social History  . Marital Status: Single    Spouse Name: N/A    Number of Children: 3  . Years of Education: some coll.   Occupational History  . Claims processing    Social History Main Topics  . Smoking status: Former Smoker -- 0.15 packs/day for 30 years    Types: Cigarettes    Quit date: 10/12/2012  . Smokeless tobacco: Never Used     Comment: cutting back  . Alcohol Use: Yes     Comment: socially  . Drug Use: No  . Sexually Active: Yes   Other Topics Concern  . None   Social History Narrative   Lives in [place] alone. 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   Review of Systems: Constitutional: Denies fever, chills, diaphoresis, appetite change and fatigue.  HEENT: Noted congestion, rhinorrhea but denies , sore throat,trouble swallowing,  neck pain, Respiratory: Noted  SOB, DOE, cough, chest tightness,  and wheezing.   Cardiovascular: Denies chest pain, palpitations and leg swelling.  Gastrointestinal: Denies nausea, vomiting, abdominal pain, diarrhea, constipation, blood in stool and abdominal distention.  Skin: Denies pallor, rash and wound.  Neurological: Denies dizziness,   Objective:  Physical Exam: Filed Vitals:   04/01/13 1626  BP: 141/102  Pulse: 85  Temp: 98 F (36.7 C)  TempSrc: Oral  Resp: 20  Height: 5' 1.25" (1.556 m)  Weight: 206 lb 3.2 oz (93.532 kg)  SpO2: 97%   Constitutional: Vital signs reviewed.  Patient is a well-developed and well-nourished female in no acute distress and cooperative with exam. Alert  and oriented x3.  Ear: TM normal bilaterally Mouth: no erythema or exudates, MMM Eyes: PERRL, EOMI, conjunctivae normal, No scleral icterus.  Neck: Supple, .  Cardiovascular: RRR, S1 normal, S2 normal, no MRG, pulses symmetric and intact bilaterally Pulmonary/Chest: Good air movement, wheezes present throughout, no rales, or rhonchi Abdominal: Soft. Non-tender, non-distended, bowel sounds are normal,  Hematology: no cervical adenopathy.  Neurological: A&O x3,

## 2013-04-01 NOTE — Telephone Encounter (Signed)
Pt called with c/o cough on and off for past week.. Non productive. No fever.  She states she can hear herself breathing and hears something rattling in chest.  She used her inhaler 4 time in past week. She usually does not need it.  Last night cough was continuous . Hx Asthma  Will see today for evaluation of cough.

## 2013-04-01 NOTE — Assessment & Plan Note (Signed)
Patient smokes occasionally. She smoked last on Saturday. Counseled on complete smoking cessation. Patient is interested and motivated to quit

## 2013-04-02 NOTE — Progress Notes (Signed)
Case discussed with Dr. Illath immediately after the resident saw the patient. We reviewed the resident's history and exam and pertinent patient test results. I agree with the assessment, diagnosis and plan of care documented in the resident's note. 

## 2013-04-06 ENCOUNTER — Emergency Department (HOSPITAL_COMMUNITY)
Admission: EM | Admit: 2013-04-06 | Discharge: 2013-04-06 | Disposition: A | Discharge status: Home / Self Care | Payer: BC Managed Care – PPO | Attending: Emergency Medicine | Admitting: Emergency Medicine

## 2013-04-06 ENCOUNTER — Encounter (HOSPITAL_COMMUNITY): Payer: Self-pay | Admitting: *Deleted

## 2013-04-06 ENCOUNTER — Emergency Department (HOSPITAL_COMMUNITY): Payer: BC Managed Care – PPO

## 2013-04-06 DIAGNOSIS — Y9389 Activity, other specified: Secondary | ICD-10-CM | POA: Insufficient documentation

## 2013-04-06 DIAGNOSIS — Z8659 Personal history of other mental and behavioral disorders: Secondary | ICD-10-CM | POA: Insufficient documentation

## 2013-04-06 DIAGNOSIS — W108XXA Fall (on) (from) other stairs and steps, initial encounter: Secondary | ICD-10-CM | POA: Insufficient documentation

## 2013-04-06 DIAGNOSIS — IMO0002 Reserved for concepts with insufficient information to code with codable children: Secondary | ICD-10-CM | POA: Insufficient documentation

## 2013-04-06 DIAGNOSIS — S93409A Sprain of unspecified ligament of unspecified ankle, initial encounter: Secondary | ICD-10-CM | POA: Insufficient documentation

## 2013-04-06 DIAGNOSIS — S93401A Sprain of unspecified ligament of right ankle, initial encounter: Secondary | ICD-10-CM

## 2013-04-06 DIAGNOSIS — Z79899 Other long term (current) drug therapy: Secondary | ICD-10-CM | POA: Insufficient documentation

## 2013-04-06 DIAGNOSIS — Z87891 Personal history of nicotine dependence: Secondary | ICD-10-CM | POA: Insufficient documentation

## 2013-04-06 DIAGNOSIS — X500XXA Overexertion from strenuous movement or load, initial encounter: Secondary | ICD-10-CM | POA: Insufficient documentation

## 2013-04-06 DIAGNOSIS — Y929 Unspecified place or not applicable: Secondary | ICD-10-CM | POA: Insufficient documentation

## 2013-04-06 DIAGNOSIS — Z8669 Personal history of other diseases of the nervous system and sense organs: Secondary | ICD-10-CM | POA: Insufficient documentation

## 2013-04-06 DIAGNOSIS — J45909 Unspecified asthma, uncomplicated: Secondary | ICD-10-CM | POA: Insufficient documentation

## 2013-04-06 DIAGNOSIS — I10 Essential (primary) hypertension: Secondary | ICD-10-CM | POA: Insufficient documentation

## 2013-04-06 NOTE — Progress Notes (Signed)
Orthopedic Tech Progress Note Patient Details:  Crystal English 1965/12/01 [id number]  Ortho Devices Type of Ortho Device: Crutches Ortho Device/Splint Interventions: Ordered;Adjustment   Jennye Moccasin 04/06/2013, 7:26 PM

## 2013-04-06 NOTE — ED Notes (Signed)
Reports wearing heels and twisting her right ankle, having pain and swelling

## 2013-04-06 NOTE — ED Provider Notes (Signed)
History    This chart was scribed for Jaynie Crumble, non-physician practitioner working with Carleene Cooper III, MD by Leone Payor, ED Scribe. This patient was seen in room TR11C/TR11C and the patient's care was started at 1724.   CSN: [id number]  Arrival date & time 04/06/13  1724   First MD Initiated Contact with Patient 04/06/13 1741      Chief Complaint  Patient presents with  . Ankle Pain     The history is provided by the patient. No language interpreter was used.    HPI Comments: Crystal English is a 47 y.o. female who presents to the Emergency Department complaining of constant pain and swelling to the R ankle starting a few hours ago. Pt states she was wearing heels and fell down a couple of steps, twisting her R ankle. She also has pain to her L knee. Pt is a former smoker and has occasional alcohol use.    Past Medical History  Diagnosis Date  . Tobacco abuse   . Asthma     Required inhaled corticosteroid in past.   . Hypertension     2 drug therapy  . Severe obesity (BMI >= 40) 10/08/2012    Obesity Class 3. BMI > 40.   Marland Kitchen OSA (obstructive sleep apnea)     Sleep study 12/2006 : Moderately Severe OSA. AHI 38.8. O2 sat decreased to 71%.  . 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 Prior episodes that did not require pt to seek medical tx.    Past Surgical History  Procedure Laterality Date  . Laparotomy      for tubal pregnancy  . Tubal ligation    . Dilation and curettage of uterus    . Ectopic pregnancy surgery      Family History  Problem Relation Age of Onset  . Diabetes Mother   . Hypertension Mother   . Stroke Father   . Heart disease Father     died <47 y.o  . Diabetes Maternal Aunt   . Cancer Maternal Aunt     breast   . Heart disease Maternal Grandmother   . Arthritis Mother   . Hyperlipidemia Mother   . Asthma Maternal Aunt     History  Substance Use Topics  . Smoking status: Former Smoker -- 0.15  packs/day for 30 years    Types: Cigarettes    Quit date: 10/12/2012  . Smokeless tobacco: Never Used     Comment: cutting back  . Alcohol Use: Yes     Comment: socially    OB History   Grav Para Term Preterm Abortions TAB SAB Ect Mult Living                  Review of Systems  Musculoskeletal: Positive for joint swelling and arthralgias.  Neurological: Negative for weakness and numbness.    Allergies  Review of patient's allergies indicates no known allergies.  Home Medications   Current Outpatient Rx  Name  Route  Sig  Dispense  Refill  . albuterol (PROVENTIL HFA;VENTOLIN HFA) 108 (90 BASE) MCG/ACT inhaler   Inhalation   Inhale 2 puffs into the lungs every 6 (six) hours as needed for wheezing.         Marland Kitchen lisinopril-hydrochlorothiazide (PRINZIDE,ZESTORETIC) 20-25 MG per tablet   Oral   Take 1 tablet by mouth daily.         . predniSONE (DELTASONE) 20 MG tablet   Oral  Take 40 mg by mouth daily.           BP 162/101  Pulse 77  Temp(Src) 98.7 F (37.1 C) (Oral)  Resp 18  SpO2 95%  LMP 03/24/2013  Physical Exam  Nursing note and vitals reviewed. Constitutional: She is oriented to person, place, and time. She appears well-developed and well-nourished. No distress.  HENT:  Head: Normocephalic and atraumatic.  Eyes: EOM are normal.  Neck: Neck supple. No tracheal deviation present.  Cardiovascular: Normal rate, regular rhythm, normal heart sounds and intact distal pulses.   Pulmonary/Chest: Effort normal and breath sounds normal. No respiratory distress. She has no wheezes. She has no rales. She exhibits no tenderness.  Abdominal: Soft. Bowel sounds are normal.  Musculoskeletal: Normal range of motion.  Superficial abrasion to L anterior knee. Full ROM of L knee joint. Negative anterior or posterior drawer sign. No laxity with medial or lateral strain.   R ankle has mild swelling noted over lateral malleolus. Tender over lateral malleolus. No tenderness  over achilles or medial malleolus. Pain with any ROM of the joint. Dorsal pedal pulses are normal. Foot exam is normal.   Neurological: She is alert and oriented to person, place, and time.  Skin: Skin is warm and dry.  Psychiatric: She has a normal mood and affect. Her behavior is normal.    ED Course  Procedures (including critical care time)  DIAGNOSTIC STUDIES: Oxygen Saturation is 95% on room air, adequate by my interpretation.    COORDINATION OF CARE: 6:07 PM Discussed treatment plan with pt at bedside and pt agreed to plan.     Dg Ankle Complete Right  04/06/2013   *RADIOLOGY REPORT*  Clinical Data: Lateral ankle pain and swelling secondary to a twisting injury earlier today.  RIGHT ANKLE - COMPLETE 3+ VIEW  Comparison: 09/25/2007  Findings: There is no fracture, dislocation, or joint effusion. Small plantar calcaneal spur.  IMPRESSION: Normal exam.   Original Report Authenticated By: Francene Boyers, M.D.      1. Sprain of right ankle, initial encounter       MDM  Pt with right ankle injury. X-ray negative. Joint stable. Neurovascularly intact. ASO and crutches ordered. Pt stable to d/c home with orthopedics or PCP follow up.   Filed Vitals:   04/06/13 1728  BP: 162/101  Pulse: 77  Temp: 98.7 F (37.1 C)  TempSrc: Oral  Resp: 18  SpO2: 95%     I personally performed the services described in this documentation, which was scribed in my presence. The recorded information has been reviewed and is accurate.   Lottie Mussel, PA-C 04/07/13 0009

## 2013-04-07 ENCOUNTER — Encounter: Payer: Self-pay | Admitting: Internal Medicine

## 2013-04-07 ENCOUNTER — Ambulatory Visit (INDEPENDENT_AMBULATORY_CARE_PROVIDER_SITE_OTHER): Payer: BC Managed Care – PPO | Admitting: Internal Medicine

## 2013-04-07 ENCOUNTER — Telehealth: Payer: Self-pay | Admitting: *Deleted

## 2013-04-07 VITALS — BP 160/102 | HR 66 | Temp 97.1°F

## 2013-04-07 DIAGNOSIS — J45909 Unspecified asthma, uncomplicated: Secondary | ICD-10-CM

## 2013-04-07 DIAGNOSIS — I10 Essential (primary) hypertension: Secondary | ICD-10-CM

## 2013-04-07 DIAGNOSIS — W19XXXA Unspecified fall, initial encounter: Secondary | ICD-10-CM

## 2013-04-07 DIAGNOSIS — S93409A Sprain of unspecified ligament of unspecified ankle, initial encounter: Secondary | ICD-10-CM

## 2013-04-07 DIAGNOSIS — S93401A Sprain of unspecified ligament of right ankle, initial encounter: Secondary | ICD-10-CM | POA: Insufficient documentation

## 2013-04-07 NOTE — Assessment & Plan Note (Signed)
Patient with right ankle sprain yesterday while following for last 2 steps at church. No fractures per x-ray in ED. Ankle brace removed and examined. Pulses intact and no sensory or motor changes noticed. She does have numbness and tingling, but sensory exam is normal.  - Ibuprofen 600 mg 3 times a day to reduce swelling which might be the cause of her numbness. - If it does not get better in 7-10 days, early appointment for further evaluation. - Continue using ankle brace. - Discussed about Longer recovery time and not expected getting better in a week.

## 2013-04-07 NOTE — Telephone Encounter (Signed)
Reviewed ER note. Thanks

## 2013-04-07 NOTE — Progress Notes (Signed)
  Subjective:    Patient ID: Crystal English, female    DOB: 07-Aug-1966, 47 y.o.   MRN: [id number]  HPI patient is a pleasant 47 year woman with sleep apnea, asthma, depression, other problems as per problem list who comes the clinic for followup visit after a recent fall yesterday at church.  She was seen in is for right ankle pain and left knee pain after fall at church yesterday. She tripped on stairs and fell for last 2 stairs and twisted her right ankle. X-ray showed no fracture.  Patient had right ankle swelling and was given ankle brace with when necessary ibuprofen and asked to follow up with our clinic.  She noticed numbness and tingling of her right foot after she woke up this morning and went to work. She elevated her legs but that did not improve her numbness. She denies any weakness of her right foot or ankle and ambulates with walker.   Review of Systems    as per history of present illness. Objective:   Physical Exam General: NAD HEENT: PERRL, EOMI, no scleral icterus Cardiac: RRR, no rubs, murmurs or gallops Pulm: clear to auscultation bilaterally, moving normal volumes of air. No wheezes. Abd: soft, nontender, nondistended, BS present Ext: warm and well perfused, trace pedal and ankle edema on right side. Tenderness to palpation on both malleoli on right side. Good range of motion with pain on eversion and inversion. DP 2+ and no sensory changes or foot on exam. Neuro: alert and oriented X3, cranial nerves II-XII grossly intact     Assessment & Plan:

## 2013-04-07 NOTE — Assessment & Plan Note (Addendum)
BP Readings from Last 3 Encounters:  04/07/13 160/102  04/06/13 162/101  04/01/13 141/102    Lab Results  Component Value Date   NA 135 03/11/2013   K 3.9 03/11/2013   CREATININE 0.84 03/11/2013    Assessment: Blood pressure control:   moderately elevated. Progress toward BP goal:    some worsening Comments: patient in pain  Plan: Medications:  continue current medications, Prinzide.  Educational resources provided:   Self management tools provided:   Other plans: No changes in medications today. Reevaluate in 2-3 weeks and increased dose of lisinopril to 40 mg.

## 2013-04-07 NOTE — Telephone Encounter (Signed)
Pt calls and states she fell at church yesterday and was told in ED to f/u in clinic, pt is scheduled for this pm in clinic for f/u

## 2013-04-07 NOTE — Patient Instructions (Signed)
Please make a followup appointment in 2-3 weeks.  Take ibuprofen 600 mg 3 times a day after meals for 7-10 days for ankle swelling- up to 2 weeks.  Continue using the ankle brace.  If your numbness does not get better in a week, give [place] a call for early appointment.

## 2013-04-07 NOTE — ED Provider Notes (Signed)
Medical screening examination/treatment/procedure(s) were performed by non-physician practitioner and as supervising physician I was immediately available for consultation/collaboration.   Carleene Cooper III, MD 04/07/13 [phone]

## 2013-04-07 NOTE — Assessment & Plan Note (Signed)
Exacerbation resolved. No wheezing today.

## 2013-04-21 ENCOUNTER — Other Ambulatory Visit: Payer: Self-pay | Admitting: *Deleted

## 2013-04-21 MED ORDER — LISINOPRIL-HYDROCHLOROTHIAZIDE 20-25 MG PO TABS: 1.0000 | ORAL_TABLET | Freq: Every day | ORAL | Status: DC

## 2013-05-15 ENCOUNTER — Telehealth: Payer: Self-pay | Admitting: *Deleted

## 2013-05-15 NOTE — Telephone Encounter (Signed)
Dr Manson Passey should not have a sch for June 30th as he will be on inpt. Have sent message to [facility]. If Tyler Aas isn't here on Friday, would you pls let [place] know? Thanks

## 2013-05-15 NOTE — Telephone Encounter (Signed)
Pt calls and states she has a rash that comes and goes on her arms and hands most of the time, she will become red, whelps appear, itches and then as fast as they appeared, they disappear. She has taken pictures of them. appt made for Monday 6/30 at 1430 dr brown

## 2013-05-19 ENCOUNTER — Ambulatory Visit: Payer: BC Managed Care – PPO | Admitting: Internal Medicine

## 2013-05-19 ENCOUNTER — Ambulatory Visit (INDEPENDENT_AMBULATORY_CARE_PROVIDER_SITE_OTHER): Payer: BC Managed Care – PPO | Admitting: Internal Medicine

## 2013-05-19 VITALS — BP 133/97 | HR 77 | Temp 97.5°F | Wt 212.8 lb

## 2013-05-19 DIAGNOSIS — F411 Generalized anxiety disorder: Secondary | ICD-10-CM

## 2013-05-19 DIAGNOSIS — F17200 Nicotine dependence, unspecified, uncomplicated: Secondary | ICD-10-CM

## 2013-05-19 DIAGNOSIS — L299 Pruritus, unspecified: Secondary | ICD-10-CM

## 2013-05-19 DIAGNOSIS — I10 Essential (primary) hypertension: Secondary | ICD-10-CM

## 2013-05-19 DIAGNOSIS — J45901 Unspecified asthma with (acute) exacerbation: Secondary | ICD-10-CM

## 2013-05-19 DIAGNOSIS — F329 Major depressive disorder, single episode, unspecified: Secondary | ICD-10-CM

## 2013-05-19 DIAGNOSIS — Z720 Tobacco use: Secondary | ICD-10-CM

## 2013-05-19 DIAGNOSIS — S93401D Sprain of unspecified ligament of right ankle, subsequent encounter: Secondary | ICD-10-CM

## 2013-05-19 DIAGNOSIS — F419 Anxiety disorder, unspecified: Secondary | ICD-10-CM

## 2013-05-19 DIAGNOSIS — J4521 Mild intermittent asthma with (acute) exacerbation: Secondary | ICD-10-CM

## 2013-05-19 DIAGNOSIS — Z5189 Encounter for other specified aftercare: Secondary | ICD-10-CM

## 2013-05-19 MED ORDER — HYDROXYZINE HCL 50 MG PO TABS: 50.0000 mg | ORAL_TABLET | Freq: Three times a day (TID) | ORAL | Status: DC | PRN

## 2013-05-19 MED ORDER — DIPHENHYDRAMINE HCL 2 % EX CREA: TOPICAL_CREAM | Freq: Three times a day (TID) | CUTANEOUS | Status: DC | PRN

## 2013-05-19 NOTE — Progress Notes (Signed)
Subjective:    Patient ID: Crystal English, female    DOB: 1965-12-02, 47 y.o.   MRN: [id number]  HPI  Crystal English is a 47 year old woman with PMH of HTN obesity, chronic asthma, major depression, who comes in for evaluation of itching with transient rash. She tells me that for the past three weeks she has experienced itching in her legs, back, but mostly in her hands and forearms that comes and ges, prompting her to scratch the area with visible redness and whelps that resolve within 20-30 minutes from their appearance. She denies change in her linens, laundry detergent, contact with poison oak or poison ivy, contact with pets, recent mosquito bites, and she denies being outdoors and has not been to swimming pools lately. Lately she has been very anxious and stressed due to her current relationships and states that the itching is often worse when she is talking about her relationship. She notes that the itching started before she started taking Vicodin for her ankle pain. She has not taken Vicondin in two days now and has not noticed improvement of her itching. She has applied a topical cream to the itching areas but with no improvement of her symptoms. She has been taking Benadryl daily with no improvement of her symptoms. She has taken pictures of theses transient whelps that appear as small red areas (2cm x3cm) of small papules with a pattern similar to that of of scratch marks.   Of note, she experiences numbness and cooling of her right foot since she sprained her ankle in May and continues to her her ankle boot. She has been seen by a Podiatrist who referred her to Neurology. She has an appointment with Neurology on July 1st.    Review of Systems  Constitutional: Negative for fever, chills, diaphoresis, activity change, appetite change, fatigue and unexpected weight change.  HENT: Negative for rhinorrhea and sneezing.   Eyes: Negative for itching.  Respiratory: Negative for cough,  shortness of breath and wheezing.   Cardiovascular: Negative for chest pain, palpitations and leg swelling.  Gastrointestinal: Negative for nausea, vomiting and abdominal pain.  Endocrine: Negative for cold intolerance and heat intolerance.  Genitourinary: Negative for frequency and difficulty urinating.  Musculoskeletal: Positive for arthralgias.       Bilateral ankle pain  Skin: Positive for rash.       Transient rash that resolves within 20-30 minutes  Neurological: Positive for numbness. Negative for dizziness and headaches.       Numbness in right ankle  Psychiatric/Behavioral: The patient is nervous/anxious.        Objective:   Physical Exam  Nursing note and vitals reviewed. Constitutional: She is oriented to person, place, and time. No distress.  Obese  HENT:  Head: Atraumatic.  Eyes: Conjunctivae are normal. No scleral icterus.  Neck: Neck supple. No thyromegaly present.  Cardiovascular: Normal rate and regular rhythm.   Pulmonary/Chest: Effort normal and breath sounds normal. No respiratory distress. She has no wheezes. She has no rales.  Abdominal: Soft.  Musculoskeletal: She exhibits no edema and no tenderness.  Neurological: She is alert and oriented to person, place, and time.  Skin: Skin is warm and dry. No rash noted. She is not diaphoretic. No erythema. No pallor.  Long fingernails bilaterally Skin intact bilaterally in UE and LE. No lesions, no papules, pustules, granules.  Small area of erythema on left dorsal hand c/w scratch marks that resolved by the end of her visit.   Psychiatric:  Anxious appearing, scratching her hands with her long fingernails as she talks.           Assessment & Plan:

## 2013-05-19 NOTE — Patient Instructions (Addendum)
General Instructions: -Take hydroxyzine as needed for anxiety and itching. Apply Benadryl cream to the affected areas.  -Follow up with [place] in 1-2 weeks if the itching does not improve.     Treatment Goals:  Goals (1 Years of Data) as of 05/19/13         As of Today As of Today 04/07/13 04/06/13 04/01/13     Blood Pressure    . Blood Pressure < 140/90  133/97 144/96 160/102 162/101 141/102     Lifestyle    . Quit smoking / using tobacco      No      Progress Toward Treatment Goals:  Treatment Goal 05/19/2013  Blood pressure at goal  Prevent falls at goal    Self Care Goals & Plans:  Self Care Goal 05/19/2013  Manage my medications take my medicines as prescribed; refill my medications on time  Monitor my health bring my blood pressure log to each visit; keep track of my blood pressure  Eat healthy foods eat foods that are low in salt; eat baked foods instead of fried foods  Be physically active find an activity I enjoy  Prevent falls use home fall prevention checklist to improve safety  Other -       Care Management & Community Referrals:  Referral 05/19/2013  Referrals made for care management support none needed  Referrals made to community resources -

## 2013-05-20 ENCOUNTER — Encounter: Payer: Self-pay | Admitting: Internal Medicine

## 2013-05-20 DIAGNOSIS — L299 Pruritus, unspecified: Secondary | ICD-10-CM | POA: Insufficient documentation

## 2013-05-20 LAB — COMPLETE METABOLIC PANEL WITH GFR
AST: 20 U/L (ref 0–37)
Albumin: 3.9 g/dL (ref 3.5–5.2)
Alkaline Phosphatase: 99 U/L (ref 39–117)
Potassium: 4.1 mEq/L (ref 3.5–5.3)
Sodium: 144 mEq/L (ref 135–145)
Total Bilirubin: 0.3 mg/dL (ref 0.3–1.2)
Total Protein: 6.9 g/dL (ref 6.0–8.3)

## 2013-05-20 LAB — CBC WITH DIFFERENTIAL/PLATELET
Basophils Absolute: 0 10*3/uL (ref 0.0–0.1)
Eosinophils Relative: 1 % (ref 0–5)
HCT: 41.2 % (ref 36.0–46.0)
Hemoglobin: 13.3 g/dL (ref 12.0–15.0)
Lymphocytes Relative: 36 % (ref 12–46)
MCHC: 32.3 g/dL (ref 30.0–36.0)
MCV: 90.4 fL (ref 78.0–100.0)
Monocytes Absolute: 0.4 10*3/uL (ref 0.1–1.0)
Monocytes Relative: 6 % (ref 3–12)
Neutro Abs: 3.9 10*3/uL (ref 1.7–7.7)
RDW: 14.7 % (ref 11.5–15.5)
WBC: 6.9 10*3/uL (ref 4.0–10.5)

## 2013-05-20 LAB — HIV ANTIBODY (ROUTINE TESTING W REFLEX): HIV: NONREACTIVE

## 2013-05-20 LAB — T4, FREE: Free T4: 1.11 ng/dL (ref 0.80–1.80)

## 2013-05-20 NOTE — Assessment & Plan Note (Signed)
She has gained weight since her last visit, she states that she is under a lot of stress and not exercising much secondary to her right ankle injury in late May. Counseled her briefly on a healthy diet.

## 2013-05-20 NOTE — Assessment & Plan Note (Signed)
  Assessment: Progress toward smoking cessation:   None Barriers to progress toward smoking cessation:   Anxiety Comments: Patient is currently anxious due to stressful relationship and is not ready to quit.   Plan: Instruction/counseling given:  I counseled patient on the dangers of tobacco use, advised patient to stop smoking, and reviewed strategies to maximize success. Educational resources provided:    Education officer, museum tools provided:    Medications to assist with smoking cessation:  None Patient agreed to the following self-care plans for smoking cessation:    Other plans: Patient to follow for further counseling and monitoring of her anxiety

## 2013-05-20 NOTE — Assessment & Plan Note (Signed)
BP Readings from Last 3 Encounters:  05/19/13 133/97  04/07/13 160/102  04/06/13 162/101    Lab Results  Component Value Date   NA 144 05/19/2013   K 4.1 05/19/2013   CREATININE 0.90 05/19/2013    Assessment: Blood pressure control: controlled Progress toward BP goal:  at goal Comments: She is compliant to her medication  Plan: Medications:  continue current medications, prinzide 20-25mg  Educational resources provided: brochure Self management tools provided: home blood pressure logbook

## 2013-05-20 NOTE — Assessment & Plan Note (Signed)
She denies depression and has discontinued Bupropion on her on.

## 2013-05-20 NOTE — Assessment & Plan Note (Signed)
Persistent, she is followed by Podiatry and has follow up with Neurology for right ankle numbness. She has not taken Vicodin in two days and tells me that her pain is tolerated without this medication. She continues to wear a right ankle boot and left ankle brace.

## 2013-05-20 NOTE — Assessment & Plan Note (Addendum)
The itching is intermittent, experienced mostly in her hands and forearms, she has no rash on physical exam and no history of exposure to allergens/toxins making contact/topical dermatitis unlikely. Differential to include Neurologic causes (pins and needles sensation interpreted as pruritis or brachioradial pruritis due to C5-C8 nerve involvement); Psychogenic; metabolic etiologies such as liver disease, malignancy, renal disease (uremia) and hyperthyroidism; and infectious such as HIV.  -Ordered CBC, TSH with fT4, CMP all within normal limits. HIV Ab screen non reactive.  -She will follow up with Neurology on 7/1 and was encouraged to discuss this symptom for further work up and treatment for possible Neurologic causes (in which case gabapentin may be an appropriate treatment) -She was given prescription for hydroxyzine for itching and anxiety as well as Benadryl 2% cream (she may benefit from menthol 1% cream if her itching does not improved). Ice packs may help reduce the pruritis and she was advised to apply ice packs to her arms and hands intermittently for a few minutes at a time as tolerated. She was advised to avoid scratching and to follow up with [place] in one week if there symptoms do not improved.

## 2013-05-20 NOTE — Assessment & Plan Note (Signed)
Stable with no recent flare ups.

## 2013-05-22 NOTE — Progress Notes (Signed)
Case discussed with Dr. Kennerly soon after the resident saw the patient.  We reviewed the resident's history and exam and pertinent patient test results.  I agree with the assessment, diagnosis, and plan of care documented in the resident's note. 

## 2013-08-07 ENCOUNTER — Ambulatory Visit (INDEPENDENT_AMBULATORY_CARE_PROVIDER_SITE_OTHER): Payer: BC Managed Care – PPO | Admitting: Internal Medicine

## 2013-08-07 ENCOUNTER — Encounter: Payer: Self-pay | Admitting: Internal Medicine

## 2013-08-07 VITALS — BP 130/91 | HR 89 | Temp 97.7°F | Ht 61.5 in | Wt 224.3 lb

## 2013-08-07 DIAGNOSIS — L299 Pruritus, unspecified: Secondary | ICD-10-CM

## 2013-08-07 DIAGNOSIS — L500 Allergic urticaria: Secondary | ICD-10-CM

## 2013-08-07 MED ORDER — RANITIDINE HCL 150 MG PO TABS: 150.0000 mg | ORAL_TABLET | Freq: Every day | ORAL | Status: DC

## 2013-08-07 NOTE — Patient Instructions (Signed)
1. please start taking Ranitidine (Zantac) 150 mg at bed time. Please continue hydroxyzine and Benadryl cream. 2. Please call our clinic if nothing works after 7 days, I will give you a referral to allergy clinic for further evaluation and treatment. 3. If you have worsening of your symptoms or new symptoms arise, please call the clinic ([phone]), or go to the ER immediately if symptoms are severe.

## 2013-08-07 NOTE — Progress Notes (Signed)
Patient ID: Crystal English, female   DOB: 08-02-1966, 47 y.o.   MRN: [id number] Subjective:   Patient ID: Crystal English female   DOB: [birth year] 47 y.o.   MRN: [id number]  CC:  Follow up visit. HPI:  [name] is a 47 y.o.    with past medical history as outlined below, who presents for a followup visit today  Patient was seen in clinic for itching with transient rashes. She started having this problem in the late June 2014. The itching is intermittent, experienced mostly in her hands and forearms, but it can be any where in her body. She denies change in her linens, laundry detergent, lotion change, contact with poison oak or poison ivy, contact with pets, recent mosquito bites, and she denies being outdoors and has not been to swimming pools lately. The only new medications is Vicodin for back pain, but it was started after itches.   In her previous visit, she had negative tests, including CMP, CBC with differential, TSH and HIV antibody. She was given prescription of hydroxyzine and Benadryl cream. She has been taking hydroxyzine, but not using Benadryl cream because her insurance does not cover this medication. Today she reports that she still has itches. She notice  few linear rashes over her right upper arm and right upper chest wall.   ROS:  Denies fever, chills, fatigue, headaches,  cough, chest pain, SOB, abdominal pain, diarrhea, constipation, dysuria, urgency, frequency, hematuria.   Past Medical History  Diagnosis Date  . Tobacco abuse   . Asthma     Required inhaled corticosteroid in past.   . Hypertension     2 drug therapy  . Severe obesity (BMI >= 40) 10/08/2012    Obesity Class 3. BMI > 40.   Marland Kitchen OSA (obstructive sleep apnea)     Sleep study 12/2006 : Moderately Severe OSA. AHI 38.8. O2 sat decreased to 71%.  . 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 Prior episodes that did not require pt to seek medical tx.   Current  Outpatient Prescriptions  Medication Sig Dispense Refill  . albuterol (PROVENTIL HFA;VENTOLIN HFA) 108 (90 BASE) MCG/ACT inhaler Inhale 2 puffs into the lungs every 6 (six) hours as needed for wheezing.      . diphenhydrAMINE (BENADRYL MAXIMUM STRENGTH) 2 % cream Apply topically 3 (three) times daily as needed for itching.  30 g  0  . HYDROcodone-acetaminophen (NORCO/VICODIN) 5-325 MG per tablet       . hydrOXYzine (ATARAX/VISTARIL) 50 MG tablet Take 1 tablet (50 mg total) by mouth 3 (three) times daily as needed for itching.  30 tablet  0  . lisinopril-hydrochlorothiazide (PRINZIDE,ZESTORETIC) 20-25 MG per tablet Take 1 tablet by mouth daily.  30 tablet  5  . ranitidine (ZANTAC) 150 MG tablet Take 1 tablet (150 mg total) by mouth at bedtime.  30 tablet  1   No current facility-administered medications for this visit.   Family History  Problem Relation Age of Onset  . Diabetes Mother   . Hypertension Mother   . Stroke Father   . Heart disease Father     died <47 y.o  . Diabetes Maternal Aunt   . Cancer Maternal Aunt     breast   . Heart disease Maternal Grandmother   . Arthritis Mother   . Hyperlipidemia Mother   . Asthma Maternal Aunt    History   Social History  . Marital Status: Single  Spouse Name: N/A    Number of Children: 3  . Years of Education: some coll.   Occupational History  . Claims processing    Social History Main Topics  . Smoking status: Former Smoker -- 0.15 packs/day for 30 years    Types: Cigarettes    Quit date: 10/12/2012  . Smokeless tobacco: Never Used     Comment: cutting back  . Alcohol Use: Yes     Comment: socially  . Drug Use: No  . Sexual Activity: Yes   Other Topics Concern  . None   Social History Narrative   Lives in [place] alone. 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    Review of Systems: Full 14-point review of systems otherwise negative. See HPI.   Objective:  Physical Exam: Filed Vitals:   08/07/13 1558  BP:  130/91  Pulse: 89  Temp: 97.7 F (36.5 C)  TempSrc: Oral  Height: 5' 1.5" (1.562 m)  Weight: 224 lb 4.8 oz (101.742 kg)  SpO2: 98%    Constitutional:  No distress. Obese  HENT:  Head: Atraumatic.  Eyes: Conjunctivae are normal. No scleral icterus.  Neck: Neck supple. No thyromegaly present.  Cardiovascular: Normal rate and regular rhythm.   Pulmonary/Chest: Effort normal and breath sounds normal. No respiratory distress. She has no wheezes. She has no rales.  Abdominal: Soft.  Musculoskeletal: She exhibits no edema and no tenderness.  Neurological: She is alert and oriented to person, place, and time.  Skin:  There a few linear rashes over her right upper arm and right upper chest wall. The rashes are red in color and elevated from the skin.  Psychiatric non-psychotic.    Assessment & Plan:

## 2013-08-07 NOTE — Assessment & Plan Note (Signed)
The etiology is not clear. She had negative test, including CBC, TSH with fT4, CMP and HIV Ab. Recently, she visited her neurologist for back pain and discussed with her neurologist, who did think her itches and rashes are related to neurologic issues. Her rashes on physical examination look like urticaria.   -will let her continue hydroxyzine and start using Benadryl cream -Will add Ranitidine 150 mg daily -if no improvement in one week, will give her referral to allergy clinic.

## 2013-08-08 NOTE — Progress Notes (Signed)
INTERNAL MEDICINE TEACHING ATTENDING ADDENDUM - Jonah Blue, DO, FACP: I reviewed with the resident Dr. Leonor Liv Macmurray's  medical history, physical examination, diagnosis and results of tests and treatment and I agree with the patient's care as documented.

## 2013-09-16 ENCOUNTER — Encounter (INDEPENDENT_AMBULATORY_CARE_PROVIDER_SITE_OTHER): Payer: Self-pay

## 2013-09-16 ENCOUNTER — Ambulatory Visit (INDEPENDENT_AMBULATORY_CARE_PROVIDER_SITE_OTHER): Payer: BC Managed Care – PPO | Admitting: Internal Medicine

## 2013-09-16 ENCOUNTER — Encounter: Payer: Self-pay | Admitting: Internal Medicine

## 2013-09-16 VITALS — BP 132/96 | HR 78 | Temp 98.1°F | Ht 61.0 in | Wt 228.0 lb

## 2013-09-16 DIAGNOSIS — L501 Idiopathic urticaria: Secondary | ICD-10-CM | POA: Insufficient documentation

## 2013-09-16 DIAGNOSIS — F419 Anxiety disorder, unspecified: Secondary | ICD-10-CM

## 2013-09-16 DIAGNOSIS — I10 Essential (primary) hypertension: Secondary | ICD-10-CM

## 2013-09-16 DIAGNOSIS — L299 Pruritus, unspecified: Secondary | ICD-10-CM

## 2013-09-16 MED ORDER — HYDROXYZINE HCL 10 MG PO TABS: 5.0000 mg | ORAL_TABLET | Freq: Three times a day (TID) | ORAL | Status: DC | PRN

## 2013-09-16 MED ORDER — HYDROXYZINE HCL 10 MG PO TABS: 50.0000 mg | ORAL_TABLET | Freq: Three times a day (TID) | ORAL | Status: DC | PRN

## 2013-09-16 NOTE — Assessment & Plan Note (Addendum)
BP Readings from Last 3 Encounters:  09/16/13 132/96  08/07/13 130/91  05/19/13 133/97    Lab Results  Component Value Date   NA 144 05/19/2013   K 4.1 05/19/2013   CREATININE 0.90 05/19/2013    Assessment: Blood pressure control: mildly elevated Progress toward BP goal:  unchanged Comments: She is taking lisinopril-HCTZ 20-25mg  daily and endorses compliance with the medicaiton  Plan: Medications:  continue current medications Educational resources provided: brochure Self management tools provided: home blood pressure logbook Other plans: f/u in 2 mo for BP check

## 2013-09-16 NOTE — Patient Instructions (Signed)
**   Use D. Atarax/hydroxyzine 5 mg (he had been prescribed a 10 mg tablet, and we'll need to cut it in half and) starting this evening for your itching and hives. This dose should be much less sedating than the 50 mg that you had taken before, and hopefully it will allow you to take the medication during the day if needed without being oversedated or groggy.   ** Continue to take the Zantac 150 mg one to 2 times a day. This is another form of antihistamine, and it will help prevent formation of new hives.  ** Be sure to pickup the Benadryl cream from the pharmacy. This will help significantly with itching.   Hives Hives are itchy, red, swollen areas of the skin. They can vary in size and location on your body. Hives can come and go for hours or several days (acute hives) or for several weeks (chronic hives). Hives do not spread from person to person (noncontagious). They may get worse with scratching, exercise, and emotional stress. CAUSES   Allergic reaction to food, additives, or drugs.  Infections, including the common cold.  Illness, such as vasculitis, lupus, or thyroid disease.  Exposure to sunlight, heat, or cold.  Exercise.  Stress.  Contact with chemicals. SYMPTOMS   Red or white swollen patches on the skin. The patches may change size, shape, and location quickly and repeatedly.  Itching.  Swelling of the hands, feet, and face. This may occur if hives develop deeper in the skin. DIAGNOSIS  Your caregiver can usually tell what is wrong by performing a physical exam. Skin or blood tests may also be done to determine the cause of your hives. In some cases, the cause cannot be determined. TREATMENT  Mild cases usually get better with medicines such as antihistamines. Severe cases may require an emergency epinephrine injection. If the cause of your hives is known, treatment includes avoiding that trigger.  HOME CARE INSTRUCTIONS   Avoid causes that trigger your hives.  Take  antihistamines as directed by your caregiver to reduce the severity of your hives. Non-sedating or low-sedating antihistamines are usually recommended. Do not drive while taking an antihistamine.  Take any other medicines prescribed for itching as directed by your caregiver.  Wear loose-fitting clothing.  Keep all follow-up appointments as directed by your caregiver. SEEK MEDICAL CARE IF:   You have persistent or severe itching that is not relieved with medicine.  You have painful or swollen joints. SEEK IMMEDIATE MEDICAL CARE IF:   You have a fever.  Your tongue or lips are swollen.  You have trouble breathing or swallowing.  You feel tightness in the throat or chest.  You have abdominal pain. These problems may be the first sign of a life-threatening allergic reaction. Call your local emergency services (911 in [place] MAKE SURE YOU:   Understand these instructions.  Will watch your condition.  Will get help right away if you are not doing well or get worse. Document Released: 11/06/2005 Document Revised: 05/07/2012 Document Reviewed: 01/30/2012 [facility] Patient Information 2014 [place], [place].

## 2013-09-16 NOTE — Progress Notes (Signed)
Patient ID: Crystal English, female   DOB: 08-Feb-1966, 47 y.o.   MRN: [id number]  Subjective:   Patient ID: Crystal English female   DOB: [birth year] 47 y.o.   MRN: [id number]  HPI: Ms.Ellerie L Schmale is a 47 y.o. F w/ PMH HTN, obesity, chronic asthma, major depression, who comes in for a f/u appt for her urticaria.   Pt with urticaria primarily on her hands, forearms, and legs. She denies any changes in soaps, detergents, lotions, contact with plants or pets, insect bites, or new medications. At previous visits this year she had negative lab work, including CMP, CBC with differential, TSH and HIV antibody. She was given prescription of hydroxyzine and Benadryl cream but has not been able to afford the benadryl until recently but she has not picked it up from the pharmacy. She states that 1/4 of a tablet of the Atarax is very sedating. She states that the Atarax does improve her sx but it is too sedating to take. She states that she is not taking the Zantac b/c it "doesn't work."   Past Medical History  Diagnosis Date  . Tobacco abuse   . Asthma     Required inhaled corticosteroid in past.   . Hypertension     2 drug therapy  . Severe obesity (BMI >= 40) 10/08/2012    Obesity Class 3. BMI > 40.   Marland Kitchen OSA (obstructive sleep apnea)     Sleep study 12/2006 : Moderately Severe OSA. AHI 38.8. O2 sat decreased to 71%.  . 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 Prior episodes that did not require pt to seek medical tx.   Current Outpatient Prescriptions  Medication Sig Dispense Refill  . albuterol (PROVENTIL HFA;VENTOLIN HFA) 108 (90 BASE) MCG/ACT inhaler Inhale 2 puffs into the lungs every 6 (six) hours as needed for wheezing.      . diphenhydrAMINE (BENADRYL MAXIMUM STRENGTH) 2 % cream Apply topically 3 (three) times daily as needed for itching.  30 g  0  . HYDROcodone-acetaminophen (NORCO/VICODIN) 5-325 MG per tablet       . hydrOXYzine (ATARAX/VISTARIL)  50 MG tablet Take 1 tablet (50 mg total) by mouth 3 (three) times daily as needed for itching.  30 tablet  0  . lisinopril-hydrochlorothiazide (PRINZIDE,ZESTORETIC) 20-25 MG per tablet Take 1 tablet by mouth daily.  30 tablet  5  . ranitidine (ZANTAC) 150 MG tablet Take 1 tablet (150 mg total) by mouth at bedtime.  30 tablet  1   No current facility-administered medications for this visit.   Family History  Problem Relation Age of Onset  . Diabetes Mother   . Hypertension Mother   . Stroke Father   . Heart disease Father     died <47 y.o  . Diabetes Maternal Aunt   . Cancer Maternal Aunt     breast   . Heart disease Maternal Grandmother   . Arthritis Mother   . Hyperlipidemia Mother   . Asthma Maternal Aunt    History   Social History  . Marital Status: Single    Spouse Name: N/A    Number of Children: 3  . Years of Education: some coll.   Occupational History  . Claims processing    Social History Main Topics  . Smoking status: Former Smoker -- 0.15 packs/day for 30 years    Types: Cigarettes    Quit date: 10/12/2012  . Smokeless tobacco: Never Used  Comment: cutting back  . Alcohol Use: Yes     Comment: socially  . Drug Use: No  . Sexual Activity: Yes   Other Topics Concern  . None   Social History Narrative   Lives in [place] alone. 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   Review of Systems: Constitutional: Denies fever, chills, or fatigue.  HEENT: Denies eye pain, congestion, sore throat, rhinorrhea, sneezing, neck pain.   Respiratory: Denies SOB, DOE Musculoskeletal: +back pain Skin: +transient "whelps" and itching on BLE, and BUE. Denies any swelling of the face, hands, or feet. Neurological: Denies dizziness, seizures, syncope, light-headedness  A 12 point ROS was performed. All other symptoms were negative   Objective:  Physical Exam: Filed Vitals:   09/16/13 0905  BP: 138/93  Pulse: 79  Temp: 98.1 F (36.7 C)  TempSrc: Oral  Height:  5\' 1"  (1.549 m)  Weight: 228 lb (103.42 kg)  SpO2: 97%   Constitutional: Vital signs reviewed.  Patient is a well-developed and well-nourished female in no acute distress and cooperative with exam.  Head: Normocephalic and atraumatic HEENT: No visible angioedema Eyes: PERRL, EOMI, conjunctivae normal, No scleral icterus.  Cardiovascular: RRR, no MRG Pulmonary/Chest: normal respiratory effort, CTAB, no wheezes, rales, or rhonchi Abdominal: Soft. Non-tender, non-distended Musculoskeletal: No joint deformities, erythema, or stiffness Neurological: A&O x3, nonfocal Skin: Warm, dry and intact. No rash, edema, or urticaria present  Psychiatric: Normal mood and affect. speech and behavior is normal.  Assessment & Plan:   Please refer to Problem List based Assessment and Plan

## 2013-09-16 NOTE — Assessment & Plan Note (Addendum)
Patient with intermittent episodes of hives and itching. She's tried Atarax 50 mg, which did help improve her symptoms, but she was oversedated that she slept for almost 2 days. She said she tried the Zantac in the past which didn't seem to improve her symptoms. She has not picked up the Benadryl from the pharmacy initially secondary to financial constraints, and today she says that she didn't get it because she was told it was over-the-counter. Because Atarax is helpful for the patient, will decrease the dose significantly. I encouraged her to pick up the Benadryl cream from the pharmacy whether it was over-the-counter or behind the counter. I also encouraged her to restart taking the Zantac 1-2 times a day which will also help prevent the new onset of urticaria and pruritus.  - Atarax 5 mg by mouth 3 times a day when necessary itching and hives, begin taking it this evening before bed - Zantac 150 mg one tablet daily-twice a day as needed - Benadryl cream apply to affected areas 3 times a day when necessary

## 2013-09-17 ENCOUNTER — Ambulatory Visit: Payer: Self-pay | Admitting: Gynecology

## 2013-09-18 NOTE — Progress Notes (Signed)
Case discussed with Dr. Glenn soon after the resident saw the patient.  We reviewed the resident's history and exam and pertinent patient test results.  I agree with the assessment, diagnosis, and plan of care documented in the resident's note. 

## 2013-09-19 ENCOUNTER — Ambulatory Visit: Payer: BC Managed Care – PPO | Admitting: Internal Medicine

## 2013-11-05 ENCOUNTER — Other Ambulatory Visit: Payer: Self-pay | Admitting: Internal Medicine

## 2013-11-05 NOTE — Telephone Encounter (Signed)
Pls make routine appt with me next 4 months

## 2013-12-09 ENCOUNTER — Telehealth: Payer: Self-pay | Admitting: *Deleted

## 2013-12-09 NOTE — Telephone Encounter (Signed)
Pt called with c/o stomach problems.   Pt states on Friday while at work she started not feeling well.  She was having abd pain. Went home, used bathroom (stool was loose but not watery).  Stomach still felt nervous but she was able to return to work. Sunday night she had severe abd pain that made her lay in fetal position.  She took something for pain, not sure what it was, but pain resolved.   Now pain has stopped but she wants test done. Family history of Stomach Ca.  Pt scheduled for exam on Thursday at 3:30 Asked to call back if any changes.

## 2013-12-09 NOTE — Telephone Encounter (Signed)
Agree. thanks

## 2013-12-11 ENCOUNTER — Ambulatory Visit: Payer: BC Managed Care – PPO | Admitting: Internal Medicine

## 2013-12-11 ENCOUNTER — Encounter: Payer: Self-pay | Admitting: Internal Medicine

## 2013-12-16 ENCOUNTER — Encounter: Payer: Self-pay | Admitting: Internal Medicine

## 2013-12-16 ENCOUNTER — Ambulatory Visit: Payer: BC Managed Care – PPO | Admitting: Internal Medicine

## 2013-12-16 ENCOUNTER — Ambulatory Visit (INDEPENDENT_AMBULATORY_CARE_PROVIDER_SITE_OTHER): Payer: BC Managed Care – PPO | Admitting: Internal Medicine

## 2013-12-16 VITALS — BP 149/98 | HR 86 | Temp 97.5°F | Ht 61.0 in | Wt 232.0 lb

## 2013-12-16 DIAGNOSIS — R7309 Other abnormal glucose: Secondary | ICD-10-CM

## 2013-12-16 DIAGNOSIS — M62838 Other muscle spasm: Secondary | ICD-10-CM

## 2013-12-16 DIAGNOSIS — R7303 Prediabetes: Secondary | ICD-10-CM

## 2013-12-16 DIAGNOSIS — R109 Unspecified abdominal pain: Secondary | ICD-10-CM

## 2013-12-16 DIAGNOSIS — Z791 Long term (current) use of non-steroidal anti-inflammatories (NSAID): Secondary | ICD-10-CM

## 2013-12-16 LAB — CBC WITH DIFFERENTIAL/PLATELET
BASOS ABS: 0 10*3/uL (ref 0.0–0.1)
Basophils Relative: 0 % (ref 0–1)
EOS PCT: 1 % (ref 0–5)
Eosinophils Absolute: 0.1 10*3/uL (ref 0.0–0.7)
HCT: 40.6 % (ref 36.0–46.0)
Hemoglobin: 13.2 g/dL (ref 12.0–15.0)
Lymphocytes Relative: 35 % (ref 12–46)
Lymphs Abs: 2.2 10*3/uL (ref 0.7–4.0)
MCH: 30.8 pg (ref 26.0–34.0)
MCHC: 32.5 g/dL (ref 30.0–36.0)
MCV: 94.6 fL (ref 78.0–100.0)
Monocytes Absolute: 0.5 10*3/uL (ref 0.1–1.0)
Monocytes Relative: 8 % (ref 3–12)
Neutro Abs: 3.5 10*3/uL (ref 1.7–7.7)
Neutrophils Relative %: 56 % (ref 43–77)
Platelets: 321 10*3/uL (ref 150–400)
RBC: 4.29 MIL/uL (ref 3.87–5.11)
RDW: 15 % (ref 11.5–15.5)
WBC: 6.3 10*3/uL (ref 4.0–10.5)

## 2013-12-16 LAB — POCT GLYCOSYLATED HEMOGLOBIN (HGB A1C): Hemoglobin A1C: 6.1

## 2013-12-16 LAB — GLUCOSE, CAPILLARY: Glucose-Capillary: 98 mg/dL (ref 70–99)

## 2013-12-16 MED ORDER — OMEPRAZOLE 20 MG PO CPDR: 20.0000 mg | DELAYED_RELEASE_CAPSULE | Freq: Every day | ORAL | Status: DC

## 2013-12-16 NOTE — Assessment & Plan Note (Addendum)
Patient on chronic NSAID (Diclofenac BID x 1 year) now with diffuse abdominal pain worse after meals concerning for NSAID induced gastritis vs PUD.  No unexplained weight loss, bleeding, persistent vomiting or palpable mass concerning for malignancy.    Plan:  1) Stop Diclofenac (and all NSAIDs) and follow-up with neurology             2) Continue Gabapentin, prescribed by neurology            3) Start omeprazole 20mg  daily x 4 weeks            4) CBC to check Hgb, CMP to check creatinine, HgbA1c to screen for DM            5) Return to clinic in 1 month for follow-up; if still symptomatic consider referral to GI

## 2013-12-16 NOTE — Progress Notes (Signed)
   Subjective:    Patient ID: Crystal English, female    DOB: [birth year], 48 y.o.   MRN: [id number]  HPI Comments: Crystal English is a 48 year old woman with a PMH of HTN, OSA, asthma,  depression and severe obesity.  She presents with complaint of diffuse abdominal pain that started at work 1 week ago.  She left work with 8/10 abdominal pain and feeling as if she just needed to have a BM.  She had a loose, non-bloody BM and symptoms improved to 6/10.  Two days later she went about her normal routine and had lunch at a chain restaurant.  Later that evening she had intense 10/10 diffuse abdominal pain.  It was intermittent pain that would ease for a few minutes and then return.  Pain did not change with position or BM.  A friend brought her a pain medication (she thinks it was Percoet or Vicodin) and the pain improved.   The pain has been tolerable for the past few days but aggravated by food.  She denies new foods, medication changes or allergies.  She has been tolerating po but feels abdominal pain worsens after eating.  She had one episode of nausea but no vomiting.  Denies fevers, headache, CP, dyspnea, pain with BM, heartburn, blood in stool, weight loss or night sweats.   She is perimenopausal with irregular periods and had spotting occuring 4 days after onset of abdominal pain. Spotting lasted for about 3 days.   She has a personal history of IBS and hiatal hernia.  She denies family history of IBD or colon cancer.  Her grandmother had gastric CA in her 43s.       Review of Systems  Constitutional: Negative for fever, chills and appetite change.  Respiratory: Negative for shortness of breath.   Cardiovascular: Negative for chest pain.  Gastrointestinal: Positive for abdominal pain. Negative for nausea, vomiting, diarrhea, constipation and blood in stool.  Endocrine: Negative for polydipsia and polyuria.  Genitourinary: Negative for dysuria and hematuria.  Musculoskeletal: Negative for back  pain.  Neurological: Negative for dizziness, syncope and light-headedness.       Objective:   Physical Exam  Vitals reviewed. Constitutional: She is oriented to person, place, and time. No distress.  HENT:  Mouth/Throat: Oropharynx is clear and moist. No oropharyngeal exudate.  Eyes: Pupils are equal, round, and reactive to light.  Cardiovascular: Normal rate, regular rhythm and normal heart sounds.   Pulmonary/Chest: Effort normal and breath sounds normal. No respiratory distress. She has no wheezes. She has no rales.  Abdominal: Soft. Bowel sounds are normal. She exhibits no distension. There is no tenderness. There is no rebound and no guarding.  Neurological: She is alert and oriented to person, place, and time.  Skin: Skin is warm. She is not diaphoretic.  Psychiatric: She has a normal mood and affect. Her behavior is normal.          Assessment & Plan:  Please see problem based assessment and plan.

## 2013-12-16 NOTE — Patient Instructions (Addendum)
1. Please stop taking Diclofenac because they may be irritating your stomach.  See your neurologist as soon as possible for possible alternatives.  Please do not take NSAIDs (i.e. Ibuprofen, naproxen, Please take omeprazole every day for the next month to help your stomach heal.    2. Please take all medications as prescribed.    3. If you have worsening of your symptoms or new symptoms arise, please call the clinic ([phone]), or go to the ER immediately if symptoms are severe.   4. Return to clinic in 1 month for follow-up.

## 2013-12-17 ENCOUNTER — Telehealth: Payer: Self-pay | Admitting: *Deleted

## 2013-12-17 ENCOUNTER — Telehealth: Payer: Self-pay | Admitting: Internal Medicine

## 2013-12-17 LAB — COMPLETE METABOLIC PANEL WITH GFR
ALT: 16 U/L (ref 0–35)
AST: 12 U/L (ref 0–37)
Albumin: 3.9 g/dL (ref 3.5–5.2)
Alkaline Phosphatase: 103 U/L (ref 39–117)
BILIRUBIN TOTAL: 0.2 mg/dL — AB (ref 0.3–1.2)
BUN: 9 mg/dL (ref 6–23)
CO2: 31 mEq/L (ref 19–32)
Calcium: 9.2 mg/dL (ref 8.4–10.5)
Chloride: 104 mEq/L (ref 96–112)
Creat: 0.83 mg/dL (ref 0.50–1.10)
GFR, Est Non African American: 84 mL/min
Glucose, Bld: 104 mg/dL — ABNORMAL HIGH (ref 70–99)
Potassium: 4.2 mEq/L (ref 3.5–5.3)
Sodium: 139 mEq/L (ref 135–145)
Total Protein: 6.6 g/dL (ref 6.0–8.3)

## 2013-12-17 NOTE — Telephone Encounter (Signed)
Pt calls and leaves message she would like for dr Redmond Pulling to call her with the results of her tests, please call at [phone]

## 2013-12-17 NOTE — Telephone Encounter (Signed)
Returned Ms. Whitter's call to inform her of her lab results.  HgbA1c is 6.1 putting her at increased risk for DM.  Advised her to re-implement lifestyle changes to reduce chances of developing DM.  Advised her to schedule an appointment with diabetes educator.  She is agreeable to both.  She still has abdominal discomfort but has not yet picked up PPI.  She is tolerating po.  Her labs were reassuring. I advised her to start PPI and if not feeling improvement to come to clinic.  She was advised to go to the ED with new or severe symptoms.

## 2013-12-20 NOTE — Progress Notes (Signed)
Case discussed with Dr. Wilson at the time of the visit.  We reviewed the resident's history and exam and pertinent patient test results.  I agree with the assessment, diagnosis, and plan of care documented in the resident's note. 

## 2014-01-13 ENCOUNTER — Encounter: Payer: BC Managed Care – PPO | Admitting: Internal Medicine

## 2014-02-25 ENCOUNTER — Ambulatory Visit: Payer: BC Managed Care – PPO | Admitting: Internal Medicine

## 2014-02-27 ENCOUNTER — Ambulatory Visit (INDEPENDENT_AMBULATORY_CARE_PROVIDER_SITE_OTHER): Payer: BC Managed Care – PPO | Admitting: Internal Medicine

## 2014-02-27 VITALS — BP 115/83 | HR 73 | Temp 97.8°F | Wt 218.2 lb

## 2014-02-27 DIAGNOSIS — E785 Hyperlipidemia, unspecified: Secondary | ICD-10-CM

## 2014-02-27 DIAGNOSIS — I10 Essential (primary) hypertension: Secondary | ICD-10-CM

## 2014-02-27 DIAGNOSIS — M25649 Stiffness of unspecified hand, not elsewhere classified: Secondary | ICD-10-CM

## 2014-02-27 DIAGNOSIS — M2550 Pain in unspecified joint: Secondary | ICD-10-CM

## 2014-02-27 DIAGNOSIS — Z1239 Encounter for other screening for malignant neoplasm of breast: Secondary | ICD-10-CM

## 2014-02-27 DIAGNOSIS — Z0000 Encounter for general adult medical examination without abnormal findings: Secondary | ICD-10-CM

## 2014-02-27 MED ORDER — IBUPROFEN-FAMOTIDINE 800-26.6 MG PO TABS: 1.0000 | ORAL_TABLET | ORAL | Status: DC | PRN

## 2014-02-27 NOTE — Patient Instructions (Addendum)
-  Follow up with your PCP for your tetanus vaccine.  -We will call you with the mammogram appointment information.  -Bring all your medications with you during your next appointment.

## 2014-02-28 LAB — RHEUMATOID FACTOR

## 2014-02-28 LAB — LIPID PANEL
CHOLESTEROL: 163 mg/dL (ref 0–200)
HDL: 44 mg/dL (ref >39)
LDL CALC: 102 mg/dL — AB (ref 0–99)
TRIGLYCERIDES: 84 mg/dL (ref <150)
Total CHOL/HDL Ratio: 3.7 Ratio
VLDL: 17 mg/dL (ref 0–40)

## 2014-02-28 LAB — SEDIMENTATION RATE: Sed Rate: 28 mm/hr — ABNORMAL HIGH (ref 0–22)

## 2014-02-28 NOTE — Progress Notes (Signed)
   Subjective:    Patient ID: Crystal English, female    DOB: Sep 13, 1966, 48 y.o.   MRN: [id number]  HPI Crystal English is a 48 yr old woman with PMH of HTN, OSA, asthma, who comes in for evaluation of bilateral hand stiffness and swelling. She had originally made an appointment for evaluation of low back pain but states that this problem is resolving with Tramadol, gabapentin, and Ibuprofen-famotidine, all prescribed by her Neurologist at her Pain clinic.  She explains that for at least one year now she has been waking up with hand stiffness that lasts for over 30 minutes. The stiffness is progressively getting more prolonged. She also has noted swelling of her MTP joints and wrists. She denies personal history of Rheumatological disorders but notes that her paternal FH is unknown.  She states that her abdominal pain is resolved now and she is no longer taking omeprazole. She points out that the Ibuprofen combo pill has helped her aches and pains and is "protecting" her stomach.    Review of Systems  Constitutional: Negative for fever, chills, diaphoresis, activity change, appetite change, fatigue and unexpected weight change.  Respiratory: Negative for cough, shortness of breath and wheezing.   Cardiovascular: Negative for chest pain, palpitations and leg swelling.  Gastrointestinal: Negative for abdominal pain.  Genitourinary: Negative for dysuria.  Musculoskeletal: Positive for arthralgias, back pain and joint swelling.  Skin: Negative for color change, pallor, rash and wound.  Neurological: Negative for dizziness, light-headedness and headaches.  Psychiatric/Behavioral: Negative for agitation.       Objective:   Physical Exam  Nursing note and vitals reviewed. Constitutional: She is oriented to person, place, and time. No distress.  Obese  Cardiovascular: Normal rate and regular rhythm.   Pulmonary/Chest: Effort normal and breath sounds normal. No respiratory distress. She has  no wheezes. She has no rales.  Abdominal: Soft. Bowel sounds are normal.  Musculoskeletal: She exhibits edema. She exhibits no tenderness.  Mild swelling of the right dorsal hand compared to the left with no TTP, no erythema, no increased warmth, no signs or trauma or infection.  Normal ROM of bilateral digits and wrists.  Phalen's and Tinel's signs are absent for bilateral wrists  Neurological: She is alert and oriented to person, place, and time.  Skin: Skin is warm and dry. She is not diaphoretic.  Psychiatric: She has a normal mood and affect.          Assessment & Plan:

## 2014-03-01 ENCOUNTER — Encounter: Payer: Self-pay | Admitting: Internal Medicine

## 2014-03-01 NOTE — Assessment & Plan Note (Addendum)
She is motivated to lose weight and has lost 14 pounds since her last visit in January with reported average of 1-2 pounds per week. She states that she has been substituting at least one meal per day with a freshly blender prepared vegetable/fruit juice with added protein powder.  -Will continue monitoring. May need BMET/CBC if precipitous weight loss -Will benefit from referral to [facility] during her next visit for long term weight loss and lifestyle modification

## 2014-03-01 NOTE — Assessment & Plan Note (Signed)
BP Readings from Last 3 Encounters:  02/27/14 115/83  12/16/13 149/98  09/16/13 132/96    Lab Results  Component Value Date   NA 139 12/16/2013   K 4.2 12/16/2013   CREATININE 0.83 12/16/2013    Assessment: Blood pressure control:  Contolled Progress toward BP goal:   At goal Comments: She is on prinzide (lisinopril 20mg -HCTZ 25mg ) daily and assures compliance. BP well controlled despite NSAID use  Plan: Medications:  continue current medications Educational resources provided:   Self management tools provided:   Other plans: Pt to follow up with PCP by the end of this month.

## 2014-03-01 NOTE — Assessment & Plan Note (Addendum)
Carpal tunnel syndrome possible but she has no s/s of this problem on exam during this visit. She is at risk for osteoarthritis of her finger/wrist joints with her occupation which involves much typing. Given her history of no involvement of the DIP joints,  progressively worsened bilateral hand stiffness in the morning >30 minutes with intermittent MTP swelling and tenderness, will evaluate for possible RA.  -Ordered ESR, RF, anti-CCP (named CCP per are lab) -Ordered X-ray of bilateral wrists -Pt to continue following up with her Neurologist (?[facility]) and taking Tramadol, gabapentin, and Ibuprofen-famotidine.

## 2014-03-01 NOTE — Assessment & Plan Note (Addendum)
-  Ordered lipid panel-her 10 yr-ASCV risk is 4.6%, with lifestyle modification indicated -Ordered mammogram -She declined Tdap vaccination but will consider it during her next visit

## 2014-03-02 LAB — CYCLIC CITRUL PEPTIDE ANTIBODY, IGG: Cyclic Citrullin Peptide Ab: <2 U/mL (ref 0.0–5.0)

## 2014-03-03 NOTE — Progress Notes (Signed)
Case discussed with Dr. Kennerly at time of visit.  We reviewed the resident's history and exam and pertinent patient test results.  I agree with the assessment, diagnosis, and plan of care documented in the resident's note. 

## 2014-03-10 ENCOUNTER — Encounter: Payer: BC Managed Care – PPO | Admitting: Internal Medicine

## 2014-03-12 ENCOUNTER — Ambulatory Visit (HOSPITAL_COMMUNITY): Payer: BC Managed Care – PPO

## 2014-03-12 ENCOUNTER — Ambulatory Visit (HOSPITAL_COMMUNITY): Admission: RE | Admit: 2014-03-12 | Payer: BC Managed Care – PPO | Source: Ambulatory Visit

## 2014-03-17 ENCOUNTER — Ambulatory Visit (HOSPITAL_COMMUNITY): Payer: BC Managed Care – PPO | Attending: Internal Medicine

## 2014-04-07 ENCOUNTER — Telehealth: Payer: Self-pay | Admitting: *Deleted

## 2014-04-08 ENCOUNTER — Ambulatory Visit (INDEPENDENT_AMBULATORY_CARE_PROVIDER_SITE_OTHER): Payer: BC Managed Care – PPO | Admitting: Internal Medicine

## 2014-04-08 ENCOUNTER — Encounter: Payer: Self-pay | Admitting: Internal Medicine

## 2014-04-08 VITALS — BP 131/93 | HR 51 | Temp 97.9°F | Ht 61.0 in | Wt 211.7 lb

## 2014-04-08 DIAGNOSIS — M25519 Pain in unspecified shoulder: Secondary | ICD-10-CM

## 2014-04-08 DIAGNOSIS — I10 Essential (primary) hypertension: Secondary | ICD-10-CM

## 2014-04-08 DIAGNOSIS — M5010 Cervical disc disorder with radiculopathy, unspecified cervical region: Secondary | ICD-10-CM | POA: Insufficient documentation

## 2014-04-08 DIAGNOSIS — L508 Other urticaria: Secondary | ICD-10-CM

## 2014-04-08 MED ORDER — DESLORATADINE 5 MG PO TABS: 5.0000 mg | ORAL_TABLET | Freq: Every day | ORAL | Status: DC

## 2014-04-08 MED ORDER — IBUPROFEN-FAMOTIDINE 800-26.6 MG PO TABS: 1.0000 | ORAL_TABLET | ORAL | Status: DC | PRN

## 2014-04-08 NOTE — Patient Instructions (Signed)
Take Desloratidine as recommended. You may take Hydroxyzine while you are taking the Desloratidine. Follow up with allergy/immunology as recommended. Take the Ibuprofen as recommended.

## 2014-04-08 NOTE — Progress Notes (Signed)
Subjective:   Patient ID: Crystal English female   DOB: 01-03-1966 48 y.o.   MRN: [id number]  HPI: Crystal English is a 48 y.o. woman with PMH significant for HTN, Obesity comes to the office with CC or generalized itching all over the body except face x 1 year and left shoulder pain x 2-3 weeks.  Patient reports that she has had generalized itching all over the body except for face for the last one year. The symptoms are intermittent, happen 1-2 times a week and usually resolve after taking the hydroxyzine. Patient reports that today she has itching over her dorsal aspect of hands, abdomen, chest, back. She also reports hives along with itching. She states that the hydroxyzine helps but makes her sleep hence does not like taking the medicine.   Patient complains of left shoulder pain for the last 2-3 weeks. States that she has had similar pain in 2011 when she received cortisone injection in both shoulder joints. 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 Patient states that she moved recently and hence the pain got worse and is requesting a medication for pain.  She states that she has itching of her left eye few days ago along with some redness but she used some "eye drops" and don't have any problems now.  She denies any other complaints.   Past Medical History  Diagnosis Date  . Tobacco abuse   . Asthma     Required inhaled corticosteroid in past.   . Hypertension     2 drug therapy  . Severe obesity (BMI >= 40) 10/08/2012    Obesity Class 3. BMI > 40.   Marland Kitchen OSA (obstructive sleep apnea)     Sleep study 12/2006 : Moderately Severe OSA. AHI 38.8. O2 sat decreased to 71%.  . 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 Prior episodes that did not require pt to seek medical tx.   Current Outpatient Prescriptions  Medication  Sig Dispense Refill  . albuterol (PROVENTIL HFA;VENTOLIN HFA) 108 (90 BASE) MCG/ACT inhaler Inhale 2 puffs into the lungs every 6 (six) hours as needed for wheezing.      . desloratadine (CLARINEX) 5 MG tablet Take 1 tablet (5 mg total) by mouth daily.  30 tablet  3  . gabapentin (NEURONTIN) 300 MG capsule       . hydrOXYzine (ATARAX/VISTARIL) 10 MG tablet Take 0.5 tablets (5 mg total) by mouth 3 (three) times daily as needed for itching.  30 tablet  0  . Ibuprofen-Famotidine 800-26.6 MG TABS Take 1 tablet by mouth as needed (pain).  45 tablet  2  . lisinopril-hydrochlorothiazide (PRINZIDE,ZESTORETIC) 20-25 MG per tablet TAKE ONE TABLET BY MOUTH ONCE DAILY  30 tablet  3  . tiZANidine (ZANAFLEX) 4 MG tablet Take 4 mg by mouth at bedtime as needed for muscle spasms.      . traMADol (ULTRAM) 50 MG tablet        No current facility-administered medications for this visit.   Family History  Problem Relation Age of Onset  . Diabetes Mother   . Hypertension Mother   . Stroke Father   . Heart disease Father     died <47 y.o  . Diabetes Maternal Aunt   . Cancer Maternal Aunt     breast   . Heart disease Maternal Grandmother   .  Arthritis Mother   . Hyperlipidemia Mother   . Asthma Maternal Aunt    History   Social History  . Marital Status: Single    Spouse Name: N/A    Number of Children: 3  . Years of Education: some coll.   Occupational History  . Claims processing    Social History Main Topics  . Smoking status: Former Smoker -- 0.03 packs/day for 30 years    Types: Cigarettes    Start date: 09/20/2013  . Smokeless tobacco: Never Used     Comment: cutting back.    . Alcohol Use: Yes     Comment: socially  . Drug Use: No  . Sexual Activity: Yes   Other Topics Concern  . None   Social History Narrative   Lives in [place] alone. 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   Review of Systems: Pertinent items are noted in HPI. Objective:  Physical Exam: Filed Vitals:    04/08/14 1556  BP: 131/93  Pulse: 51  Temp: 97.9 F (36.6 C)  TempSrc: Oral  Height: 5\' 1"  (1.549 m)  Weight: 211 lb 11.2 oz (96.026 kg)  SpO2: 97%   Constitutional: Vital signs reviewed.  Patient is a well-developed and well-nourished and is in no acute distress and cooperative with exam. Alert and oriented x3.  Eyes: Cconjunctivae normal, No scleral icterus.  Cardiovascular: RRR, S1 normal, S2 normal, no MRG. Warm, dry and intact. No rash, cyanosis, or clubbing.  Pulmonary/Chest: normal respiratory effort, CTAB, no wheezes, rales, or rhonchi Musculoskeletal: Left shoulder AC point tenderness. Mild restriction of movement above the head level. No visible swelling noted. Neurological: A&O x3 Skin: Hives noted over the dorsal aspect of both hands, lower back, upper chest. The lesions appear erythematous linear lesions raised above the skin surface. Evidence of scratch marks noted. Psychiatric: Normal mood and affect.  Assessment & Plan:

## 2014-04-08 NOTE — Assessment & Plan Note (Signed)
Chronic urticaria of almost one year duration.  Unclear etiology. Work up included - CBC, CMP, HIV1/2, ESR, RA Factor, CCP. All are within normal limits except for slightly elevated ESR at 25. Patient reports improvement of symptoms with the use of Hydroxyzine but complains of drowsiness. Discussed with the attending regarding further management and plans.  Plans: Start Desloratidine 5 mg qd. Tryptase levels. Allergy/immunology referral for further management.

## 2014-04-08 NOTE — Assessment & Plan Note (Signed)
Pain and tenderness over the left AC joint. Imaging studies from 2011 show degenerative changes at [facility] joint bilaterally.  Plans: Start Ibuprofen- Famotidine tid for 2 weeks. If symptoms persist or worsen, refer to sports medicine for possible cortisone injection.

## 2014-04-08 NOTE — Assessment & Plan Note (Signed)
Well controlled on current regimen.  Plans: Continue current regimen.

## 2014-04-09 NOTE — Progress Notes (Signed)
INTERNAL MEDICINE TEACHING ATTENDING ADDENDUM - Almon Whitford, MD: I reviewed and discussed with the resident Dr. Boggala, the patient's medical history, physical examination, diagnosis and results of pertinent tests and treatment and I agree with the patient's care as documented. 

## 2014-04-16 NOTE — Telephone Encounter (Signed)
closed

## 2014-05-01 ENCOUNTER — Other Ambulatory Visit: Payer: Self-pay | Admitting: Internal Medicine

## 2014-05-05 ENCOUNTER — Encounter: Payer: Self-pay | Admitting: Internal Medicine

## 2014-05-05 ENCOUNTER — Ambulatory Visit (INDEPENDENT_AMBULATORY_CARE_PROVIDER_SITE_OTHER): Payer: BC Managed Care – PPO | Admitting: Internal Medicine

## 2014-05-05 VITALS — BP 145/104 | HR 86 | Temp 97.5°F | Ht 61.0 in | Wt 213.7 lb

## 2014-05-05 DIAGNOSIS — G43829 Menstrual migraine, not intractable, without status migrainosus: Secondary | ICD-10-CM

## 2014-05-05 DIAGNOSIS — I10 Essential (primary) hypertension: Secondary | ICD-10-CM

## 2014-05-05 DIAGNOSIS — G43909 Migraine, unspecified, not intractable, without status migrainosus: Secondary | ICD-10-CM

## 2014-05-05 MED ORDER — ASPIRIN-ACETAMINOPHEN-CAFFEINE 250-250-65 MG PO TABS: 1.0000 | ORAL_TABLET | Freq: Four times a day (QID) | ORAL | Status: DC | PRN

## 2014-05-05 NOTE — Patient Instructions (Addendum)
Take over the counter Excedrin migraine tablets, 1 tablet every 6 hours as needed for headache. Maximum four tablet per a day. If your headache does not get better in the next 2-3 days, please give [place] a call.

## 2014-05-05 NOTE — Progress Notes (Signed)
Subjective:   Patient ID: Crystal English female   DOB: 1965/11/25 48 y.o.   MRN: [id number]  HPI: [name] is a 48 y.o. woman with PMH significant for HTN, Obesity comes to the office with CC of frontal headaches x 4 days.  Patient reports that her menstrual cycle started on 05/01/14 and her headaches also started on the day. She reports frontal headaches, intermittent, 5/10 in severity, not associated with nausea, vomiting, neck stiffness, blurred vision. She denies any fever, chills, body pains. She denies any neurological deficits. She reports history of migraine headaches for several years, that happen during her menstrual cycles and typically resolved with OTC Tylenol. She reports not taking any Tylenol in the last 4 days. She also reports running of her BP medications about 4 days ago, but picked up the prescription last night and started taking.  She denies any other complaints.  Past Medical History  Diagnosis Date  . Tobacco abuse   . Asthma     Required inhaled corticosteroid in past.   . Hypertension     2 drug therapy  . Severe obesity (BMI >= 40) 10/08/2012    Obesity Class 3. BMI > 40.   Marland Kitchen OSA (obstructive sleep apnea)     Sleep study 12/2006 : Moderately Severe OSA. AHI 38.8. O2 sat decreased to 71%.  . 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 Prior episodes that did not require pt to seek medical tx.   Current Outpatient Prescriptions  Medication Sig Dispense Refill  . albuterol (PROVENTIL HFA;VENTOLIN HFA) 108 (90 BASE) MCG/ACT inhaler Inhale 2 puffs into the lungs every 6 (six) hours as needed for wheezing.      Marland Kitchen aspirin-acetaminophen-caffeine (EXCEDRIN MIGRAINE) 250-250-65 MG per tablet Take 1 tablet by mouth every 6 (six) hours as needed for headache.  60 tablet  0  . desloratadine (CLARINEX) 5 MG tablet Take 1 tablet (5 mg total) by mouth daily.  30 tablet  3  . gabapentin (NEURONTIN) 300 MG capsule       .  hydrOXYzine (ATARAX/VISTARIL) 10 MG tablet Take 0.5 tablets (5 mg total) by mouth 3 (three) times daily as needed for itching.  30 tablet  0  . Ibuprofen-Famotidine 800-26.6 MG TABS Take 1 tablet by mouth as needed (pain).  45 tablet  2  . lisinopril-hydrochlorothiazide (PRINZIDE,ZESTORETIC) 20-25 MG per tablet TAKE ONE TABLET BY MOUTH ONCE DAILY  30 tablet  11  . tiZANidine (ZANAFLEX) 4 MG tablet Take 4 mg by mouth at bedtime as needed for muscle spasms.      . traMADol (ULTRAM) 50 MG tablet        No current facility-administered medications for this visit.   Family History  Problem Relation Age of Onset  . Diabetes Mother   . Hypertension Mother   . Stroke Father   . Heart disease Father     died <47 y.o  . Diabetes Maternal Aunt   . Cancer Maternal Aunt     breast   . Heart disease Maternal Grandmother   . Arthritis Mother   . Hyperlipidemia Mother   . Asthma Maternal Aunt    History   Social History  . Marital Status: Single    Spouse Name: N/A    Number of Children: 3  . Years of Education: some coll.   Occupational History  . Claims processing    Social History Main Topics  . Smoking status: Former Smoker -- 0.03  packs/day for 30 years    Types: Cigarettes    Start date: 09/20/2013  . Smokeless tobacco: Never Used     Comment: cutting back.  Restarted .  Marland Kitchen Alcohol Use: Yes     Comment: socially  . Drug Use: No  . Sexual Activity: Yes   Other Topics Concern  . None   Social History Narrative   Lives in [place] alone. 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   Review of Systems: Pertinent items are noted in HPI. Objective:  Physical Exam: Filed Vitals:   05/05/14 1040  BP: 145/104  Pulse: 86  Temp: 97.5 F (36.4 C)  TempSrc: Oral  Height: 5\' 1"  (1.549 m)  Weight: 213 lb 11.2 oz (96.934 kg)  SpO2: 97%   Constitutional: Vital signs reviewed. Patient is a well-developed and well-nourished and is in no acute distress and cooperative with exam.    Alert and oriented x3.  Eyes: Cconjunctivae normal, No scleral icterus.  Cardiovascular: RRR, S1 normal, S2 normal, no MRG.  Pulmonary/Chest: normal respiratory effort, CTAB, no wheezes, rales, or rhonchi  Extremities: No pedal edema. Neurological: A&O x3. No nuchal rigidity. Motor strength 5/5 and equal and symmetrical in all the extremities. Non-focal neuro exam. Psychiatric: Normal mood and affect.  Assessment & Plan:

## 2014-05-05 NOTE — Assessment & Plan Note (Signed)
Slightly elevated, suspect secondary to running of medications last four days.  Plans: Emphasized the importance of taking her medications regularly. Continue current regimen.

## 2014-05-05 NOTE — Assessment & Plan Note (Signed)
Patient symptoms are suggestive of menstrual migraine. Patient has a chronic history of migraine headache that is associated with menstrual cycles. Clinically, patient does not have symptoms or signs suggestive Intracranial etiology or meningeal etiology.  Discussed with the attending regarding further management and plan.  Plans: Start Excedrin migraine headache - every 6 hours as needed for headaches. Recommended to call the clinic if the headaches do not improve in 2-3 days.

## 2014-05-06 NOTE — Progress Notes (Signed)
Case discussed with Dr. Boggala at the time of the visit.  We reviewed the resident's history and exam and pertinent patient test results.  I agree with the assessment, diagnosis, and plan of care documented in the resident's note. 

## 2014-05-12 ENCOUNTER — Ambulatory Visit (INDEPENDENT_AMBULATORY_CARE_PROVIDER_SITE_OTHER): Payer: BC Managed Care – PPO | Admitting: Internal Medicine

## 2014-05-12 ENCOUNTER — Encounter: Payer: Self-pay | Admitting: Internal Medicine

## 2014-05-12 ENCOUNTER — Ambulatory Visit: Payer: BC Managed Care – PPO | Admitting: Internal Medicine

## 2014-05-12 ENCOUNTER — Other Ambulatory Visit (HOSPITAL_COMMUNITY)
Admission: RE | Admit: 2014-05-12 | Discharge: 2014-05-12 | Disposition: A | Discharge status: Home / Self Care | Payer: BC Managed Care – PPO | Source: Ambulatory Visit | Attending: Internal Medicine | Admitting: Internal Medicine

## 2014-05-12 VITALS — BP 138/94 | HR 76 | Temp 97.6°F | Ht 61.0 in | Wt 211.9 lb

## 2014-05-12 DIAGNOSIS — N898 Other specified noninflammatory disorders of vagina: Secondary | ICD-10-CM

## 2014-05-12 DIAGNOSIS — Z113 Encounter for screening for infections with a predominantly sexual mode of transmission: Secondary | ICD-10-CM | POA: Insufficient documentation

## 2014-05-12 DIAGNOSIS — N760 Acute vaginitis: Secondary | ICD-10-CM | POA: Insufficient documentation

## 2014-05-12 DIAGNOSIS — A5901 Trichomonal vulvovaginitis: Secondary | ICD-10-CM

## 2014-05-12 DIAGNOSIS — L500 Allergic urticaria: Secondary | ICD-10-CM

## 2014-05-12 NOTE — Patient Instructions (Signed)
1. Please schedule a follow up appointment for 2 weeks.  2. Please take all medications as prescribed.   I will call you with results in the next 1-2 days.   3. If you have worsening of your symptoms or new symptoms arise, please call the clinic ([phone]), or go to the ER immediately if symptoms are severe.  You have done a great job in taking all your medications. I appreciate it very much. Please continue doing that.
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
## 2014-05-13 ENCOUNTER — Other Ambulatory Visit: Payer: Self-pay | Admitting: Internal Medicine

## 2014-05-13 ENCOUNTER — Encounter: Payer: Self-pay | Admitting: Internal Medicine

## 2014-05-13 ENCOUNTER — Ambulatory Visit: Payer: BC Managed Care – PPO | Admitting: Internal Medicine

## 2014-05-13 LAB — URINALYSIS, ROUTINE W REFLEX MICROSCOPIC
BILIRUBIN URINE: NEGATIVE
Glucose, UA: NEGATIVE mg/dL
Hgb urine dipstick: NEGATIVE
KETONES UR: NEGATIVE mg/dL
Leukocytes, UA: NEGATIVE
Nitrite: NEGATIVE
PROTEIN: NEGATIVE mg/dL
Specific Gravity, Urine: 1.019 (ref 1.005–1.030)
UROBILINOGEN UA: 0.2 mg/dL (ref 0.0–1.0)
pH: 6.5 (ref 5.0–8.0)

## 2014-05-13 LAB — HIV ANTIBODY (ROUTINE TESTING W REFLEX): HIV 1&2 Ab, 4th Generation: NONREACTIVE

## 2014-05-13 MED ORDER — METRONIDAZOLE 500 MG PO TABS: 2000.0000 mg | ORAL_TABLET | Freq: Once | ORAL | Status: DC

## 2014-05-13 NOTE — Progress Notes (Signed)
INTERNAL MEDICINE TEACHING ATTENDING ADDENDUM - Nischal Narendra, MD: I reviewed and discussed at the time of visit with the resident Dr. Jones, the patient's medical history, physical examination, diagnosis and results of tests and treatment and I agree with the patient's care as documented.    

## 2014-05-13 NOTE — Assessment & Plan Note (Signed)
Vaginal discharge and pruritis for 2-3 weeks. No systemic symptoms. On exam, significant purulent/greenish appearing discharge. No cervical motion tenderness.  -UA wnl -HIV negative -Wet prep POSITIVE for trichomoniasis + gardnerella -GC/Chlamydia NEGATIVE -Called and discussed results w/ patient; advised Crystal English to take Metronidazole 2000 mg once; called into pharmacy -Will return to clinic in 2 weeks.

## 2014-05-14 ENCOUNTER — Ambulatory Visit (INDEPENDENT_AMBULATORY_CARE_PROVIDER_SITE_OTHER): Payer: BC Managed Care – PPO | Admitting: Internal Medicine

## 2014-05-14 ENCOUNTER — Encounter: Payer: Self-pay | Admitting: Internal Medicine

## 2014-05-14 ENCOUNTER — Other Ambulatory Visit: Payer: BC Managed Care – PPO

## 2014-05-14 VITALS — BP 128/86 | HR 95 | Ht 60.0 in | Wt 210.0 lb

## 2014-05-14 DIAGNOSIS — L500 Allergic urticaria: Secondary | ICD-10-CM

## 2014-05-14 DIAGNOSIS — L508 Other urticaria: Secondary | ICD-10-CM

## 2014-05-14 MED ORDER — OLMESARTAN MEDOXOMIL-HCTZ 40-25 MG PO TABS: 1.0000 | ORAL_TABLET | Freq: Every day | ORAL | Status: DC

## 2014-05-14 NOTE — Patient Instructions (Signed)
Instead of hydroxyzine, try taking otc Allegra 180/ fexofenadine 1 daily for the next 3 weeks, then as needed  Order- lab Allergy profile, Food IgE profile     Dx urticaria  Stop lisinopril-hct for now  Script for Benicar 40-HCT 1 daily for BP. Take this for one month.   See if the hives stop

## 2014-05-14 NOTE — Progress Notes (Signed)
05/14/14- 28 yoF  smoker COMPLAINS OF:  Having hives over past 8 months.  Appear all over body, randomly come. Medical problems including urticaria, asthma,obesity, OSA, HBP, depression New onset of a hiatus about 8 months ago. Generalized urticaria most days. Hydroxyzine helps but makes her sleepy. She has been on lisinopril a long time-association possible. No other trigger identified, except possibly heat. She has not considered herself an allergic/atopic person and there is no family history of hives. She is not pregnant. No food intolerance.  Prior to Admission medications   Medication Sig Start Date End Date Taking? Authorizing Provider  albuterol (PROVENTIL HFA;VENTOLIN HFA) 108 (90 BASE) MCG/ACT inhaler Inhale 2 puffs into the lungs every 6 (six) hours as needed for wheezing.   Yes Historical Provider, MD  lisinopril-hydrochlorothiazide (PRINZIDE,ZESTORETIC) 20-25 MG per tablet TAKE ONE TABLET BY MOUTH ONCE DAILY 05/01/14  Yes Bartholomew Crews, MD  cyclobenzaprine (FLEXERIL) 10 MG tablet Take 1 tablet (10 mg total) by mouth 3 (three) times daily as needed for muscle spasms. 07/02/14   Bartholomew Crews, MD  diclofenac (VOLTAREN) 50 MG EC tablet Take 1 tablet (50 mg total) by mouth 2 (two) times daily. 06/26/14   Juluis Mire, MD  diphenhydrAMINE (BENADRYL) 25 mg capsule Take 25 mg by mouth every 6 (six) hours as needed.    Historical Provider, MD  fexofenadine (ALLEGRA) 180 MG tablet Take 180 mg by mouth daily as needed for allergies or rhinitis.    Historical Provider, MD  gabapentin (NEURONTIN) 300 MG capsule Take 1 capsule (300 mg total) by mouth 3 (three) times daily. 06/25/14   Bartholomew Crews, MD   Past Medical History  Diagnosis Date  . Tobacco abuse   . Asthma     Required inhaled corticosteroid in past.   . Hypertension     2 drug therapy  . Severe obesity (BMI >= 40) 10/08/2012    Obesity Class 3. BMI > 40.   Marland Kitchen OSA (obstructive sleep apnea)     Sleep study 12/2006 :  Moderately Severe OSA. AHI 38.8. O2 sat decreased to 71%.  . 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 Prior episodes that did not require pt to seek medical tx.   Past Surgical History  Procedure Laterality Date  . Laparotomy      for tubal pregnancy  . Tubal ligation    . Dilation and curettage of uterus    . Ectopic pregnancy surgery     Family History  Problem Relation Age of Onset  . Diabetes Mother   . Hypertension Mother   . Stroke Father     in his 77's  . Heart disease Father     died <47 y.o  . Diabetes Maternal Aunt   . Cancer Maternal Aunt     breast   . Heart disease Maternal Grandmother   . Arthritis Mother   . Hyperlipidemia Mother   . Asthma Maternal Aunt    History   Social History  . Marital Status: Single    Spouse Name: N/A    Number of Children: 3  . Years of Education: some coll.   Occupational History  . Claims processing    Social History Main Topics  . Smoking status: Light Tobacco Smoker -- 0.03 packs/day for 30 years    Types: Cigarettes    Last Attempt to Quit: 09/20/2013  . Smokeless tobacco: Never Used     Comment: 2 cigs per day  .  Alcohol Use: Yes     Comment: rarely.  . Drug Use: No  . Sexual Activity: Not on file   Other Topics Concern  . Not on file   Social History Narrative   Lives in [place] alone. 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   ROS-see HPI Constitutional:   No-   weight loss, night sweats, fevers, chills, fatigue, lassitude. HEENT:   No-  headaches, difficulty swallowing, tooth/dental problems, sore throat,       No-  sneezing, +itching,no-ear ache, nasal congestion, post nasal drip,  CV:  No-   chest pain, orthopnea, PND, swelling in lower extremities, anasarca,                                  dizziness, palpitations Resp: No-   shortness of breath with exertion or at rest.              No-   productive cough,  No non-productive cough,  No- coughing up of blood.               No-   change in color of mucus.  No- wheezing.   Skin: +HPI GI:  No-   heartburn, indigestion, abdominal pain, nausea, vomiting, diarrhea,                 change in bowel habits, loss of appetite GU: No-   dysuria, change in color of urine, no urgency or frequency.  No- flank pain. MS:  No-   joint pain or swelling.  No- decreased range of motion.  No- back pain. Neuro-     nothing unusual Psych:  No- change in mood or affect. No depression or anxiety.  No memory loss.  OBJ- Physical Exam General- Alert, Oriented, Affect-appropriate, Distress- none acute Skin- rash-none, lesions- none, excoriation- none, skin is clear at this visit Lymphadenopathy- none Head- atraumatic            Eyes- Gross vision intact, PERRLA, conjunctivae and secretions clear            Ears- Hearing, canals-normal            Nose- Clear, no-Septal dev, mucus, polyps, erosion, perforation             Throat- Mallampati II , mucosa+coated tongue , drainage- none, tonsils- atrophic Neck- flexible , trachea midline, no stridor , thyroid nl, carotid no bruit Chest - symmetrical excursion , unlabored           Heart/CV- RRR , no murmur , no gallop  , no rub, nl s1 s2                           - JVD- none , edema- none, stasis changes- none, varices- none           Lung- clear to P&A, wheeze- none, cough- none , dullness-none, rub- none           Chest wall-  Abd- tender-no, distended-no, bowel sounds-present, HSM- no Br/ Gen/ Rectal- Not done, not indicated Extrem- cyanosis- none, clubbing, none, atrophy- none, strength- nl Neuro- grossly intact to observation

## 2014-05-15 ENCOUNTER — Telehealth: Payer: Self-pay | Admitting: Internal Medicine

## 2014-05-15 LAB — ALLERGY FULL PROFILE
Allergen, D pternoyssinus,d7: <0.1 kU/L
Allergen,Goose feathers, e70: <0.1 kU/L
Aspergillus fumigatus, m3: <0.1 kU/L
Box Elder IgE: <0.1 kU/L
CANDIDA ALBICANS: 0.21 kU/L — AB
Common Ragweed: <0.1 kU/L
Curvularia lunata: <0.1 kU/L
Fescue: <0.1 kU/L
G005 Rye, Perennial: <0.1 kU/L
G009 Red Top: <0.1 kU/L
House Dust Hollister: <0.1 kU/L
Lamb's Quarters: <0.1 kU/L
Oak: <0.1 kU/L
Stemphylium Botryosum: <0.1 kU/L
Sycamore Tree: <0.1 kU/L
Timothy Grass: <0.1 kU/L

## 2014-05-15 LAB — ALLERGEN FOOD PROFILE SPECIFIC IGE
Apple: <0.1 kU/L
Chicken IgE: <0.1 kU/L
Corn: <0.1 kU/L
Egg White IgE: <0.1 kU/L
Fish Cod: <0.1 kU/L
IgE (Immunoglobulin E), Serum: 183 IU/mL — ABNORMAL HIGH (ref 0.0–180.0)
Milk IgE: 0.46 kU/L — ABNORMAL HIGH
Peanut IgE: <0.1 kU/L
Shrimp IgE: <0.1 kU/L
Tomato IgE: <0.1 kU/L
Tuna IgE: <0.1 kU/L
Wheat IgE: <0.1 kU/L

## 2014-05-15 NOTE — Telephone Encounter (Signed)
Pt requesting lab results. Please advise Dr. Annamaria Boots thanks

## 2014-05-15 NOTE — Telephone Encounter (Signed)
Pt aware of results and allergy skin testing appt made for patient.

## 2014-05-15 NOTE — Telephone Encounter (Signed)
I have commented in Results- ok to call her from that

## 2014-05-18 ENCOUNTER — Telehealth: Payer: Self-pay | Admitting: Internal Medicine

## 2014-05-18 NOTE — Telephone Encounter (Signed)
Pt returned call.  Crystal English ° °

## 2014-05-18 NOTE — Telephone Encounter (Signed)
LMTCB    Notes Recorded by Deneise Lever, MD on 05/15/2014 at 3:24 PM Allergy profiles show elevated total IgE allergy antibodies, but specific elevations only for milk/dairy and for an ordinary yeast. Suggest we schedule for allergy skin testing, off antihistamines x 3 days.

## 2014-05-18 NOTE — Telephone Encounter (Signed)
Spoke with pt-- aware of results. Nothing further needed.

## 2014-05-29 ENCOUNTER — Encounter: Payer: Self-pay | Admitting: Internal Medicine

## 2014-05-29 ENCOUNTER — Ambulatory Visit (INDEPENDENT_AMBULATORY_CARE_PROVIDER_SITE_OTHER): Payer: BC Managed Care – PPO | Admitting: Internal Medicine

## 2014-05-29 VITALS — BP 151/103 | HR 92 | Temp 97.2°F | Ht 61.0 in | Wt 216.1 lb

## 2014-05-29 DIAGNOSIS — I10 Essential (primary) hypertension: Secondary | ICD-10-CM

## 2014-05-29 DIAGNOSIS — L508 Other urticaria: Secondary | ICD-10-CM

## 2014-05-29 NOTE — Patient Instructions (Signed)
Start using the Benicar -HCTZ as recommended by your allergy specialist.

## 2014-06-01 NOTE — Progress Notes (Signed)
Subjective:   Patient ID: Crystal English female   DOB: 05-Dec-1965 48 y.o.   MRN: [id number]  HPI: [name] is a 48 y.o. woman with PMH significant for HTN, Obesity comes to the office for a follow of her previous office visit for hives.  I saw patient previously for chronic hives and referred her to allergy and immunology for further work up and management. 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 Patient was also advised to stop lisinopril and start Benicar for HTN management which patient reports hasn't started yet as it is very expensive. She reports not using the Lisinopril-HCTZ as well.   Patient denies any other complaints during this office visit.  Past Medical History  Diagnosis Date  . Tobacco abuse   . Asthma     Required inhaled corticosteroid in past.   . Hypertension     2 drug therapy  . Severe obesity (BMI >= 40) 10/08/2012    Obesity Class 3. BMI > 40.   Marland Kitchen OSA (obstructive sleep apnea)     Sleep study 12/2006 : Moderately Severe OSA. AHI 38.8. O2 sat decreased to 71%.  . 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 Prior episodes that did not require pt to seek medical tx.   Current Outpatient Prescriptions  Medication Sig Dispense Refill  . albuterol (PROVENTIL HFA;VENTOLIN HFA) 108 (90 BASE) MCG/ACT inhaler Inhale 2 puffs into the lungs every 6 (six) hours as needed for wheezing.      Marland Kitchen aspirin-acetaminophen-caffeine (EXCEDRIN MIGRAINE) 250-250-65 MG per tablet Take 1 tablet by mouth every 6 (six) hours as needed for headache.  60 tablet  0  . hydrOXYzine (ATARAX/VISTARIL) 10 MG tablet Take 0.5 tablets (5 mg total) by mouth 3 (three) times daily as needed for itching.  30 tablet  0  . Ibuprofen-Famotidine 800-26.6 MG TABS Take 1 tablet by mouth as needed (pain).  45 tablet  2  . lisinopril-hydrochlorothiazide (PRINZIDE,ZESTORETIC) 20-25 MG per tablet TAKE ONE  TABLET BY MOUTH ONCE DAILY  30 tablet  11  . olmesartan-hydrochlorothiazide (BENICAR HCT) 40-25 MG per tablet Take 1 tablet by mouth daily.  30 tablet  2  . tiZANidine (ZANAFLEX) 4 MG tablet Take 4 mg by mouth at bedtime as needed for muscle spasms.      . traMADol (ULTRAM) 50 MG tablet        No current facility-administered medications for this visit.   Family History  Problem Relation Age of Onset  . Diabetes Mother   . Hypertension Mother   . Stroke Father   . Heart disease Father     died <47 y.o  . Diabetes Maternal Aunt   . Cancer Maternal Aunt     breast   . Heart disease Maternal Grandmother   . Arthritis Mother   . Hyperlipidemia Mother   . Asthma Maternal Aunt    History   Social History  . Marital Status: Single    Spouse Name: N/A    Number of Children: 3  . Years of Education: some coll.   Occupational History  . Claims processing    Social History Main Topics  . Smoking status: Light Tobacco Smoker -- 0.03 packs/day for 30 years    Types: Cigarettes    Last Attempt to Quit: 09/20/2013  . Smokeless tobacco: Never Used     Comment: 2 cigs per day  .  Alcohol Use: Yes     Comment: rarely.  . Drug Use: No  . Sexual Activity: None   Other Topics Concern  . None   Social History Narrative   Lives in [place] alone. 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   Review of Systems: As per HPI.   Objective:  Physical Exam: Filed Vitals:   05/29/14 1607  BP: 151/103  Pulse: 92  Temp: 97.2 F (36.2 C)  TempSrc: Oral  Height: 5\' 1"  (1.549 m)  Weight: 216 lb 1.6 oz (98.022 kg)  SpO2: 97%  Constitutional: Vital signs reviewed. Patient is a well-developed and well-nourished and is in no acute distress and cooperative with exam.  Alert and oriented x3.  Eyes: Cconjunctivae normal, No scleral icterus.  Cardiovascular: RRR, S1 normal, S2 normal, no MRG.  Pulmonary/Chest: normal respiratory effort, CTAB, no wheezes, rales, or rhonchi  Extremities: No pedal  edema.  Neurological: A&O x3.   Assessment & Plan:

## 2014-06-01 NOTE — Assessment & Plan Note (Signed)
Patient has a follow up appt with allergist.  Plans: As per allergy & Immunology.

## 2014-06-01 NOTE — Assessment & Plan Note (Signed)
Uncontrolled and not at goal secondary to non-compliance. Patient was recommended to start on Benicar HCT and to discontinue Lisinopril-HCTZ but couldn't afford Benicar HCTZ.  Plans: Recommended to discuss with her allergy specialist if it is okay to restart Lisinopril-HCTZ. Next option is to consider ARB and HCTZ separately to make it affordable.

## 2014-06-05 NOTE — Progress Notes (Signed)
Case discussed with Dr. Boggala at the time of the visit.  We reviewed the resident's history and exam and pertinent patient test results.  I agree with the assessment, diagnosis, and plan of care documented in the resident's note. 

## 2014-06-13 ENCOUNTER — Other Ambulatory Visit: Payer: Self-pay | Admitting: Internal Medicine

## 2014-06-15 NOTE — Telephone Encounter (Signed)
Pt has continued with med. Allergist did lab work and states  allergic to Copywriter, advertising.

## 2014-06-15 NOTE — Telephone Encounter (Signed)
Thank you. Then she should have refills at drug store as I refilled one year in June

## 2014-06-15 NOTE — Telephone Encounter (Signed)
Talked with pt - never stopped ACE l - pt went to allergist did lab work . Called pt the next day and told her she is allergic to dairy products and yeast. Pt has continue with this med. To see allergist soon. Hilda Blades Brooke Steinhilber RN 06/15/14 3:55PM

## 2014-06-15 NOTE — Telephone Encounter (Signed)
I refilled the Ace I in June. Saw Dr Eyvonne Mechanic in July. She was taken off her ACEI by her allergist and was not to resume it until spoken to allergist and given OK to restart it. There is no note from allergist in [place] stating she could restart it. Would you pls F/U with the pt regarding whether she is even supposed to be on ACEI? THanks

## 2014-06-16 NOTE — Telephone Encounter (Signed)
Talked with pharmacy - they have Rx from 05/01/14 and is filled - waiting on pt to pick up.

## 2014-06-16 NOTE — Telephone Encounter (Signed)
Talked with pharmacy and Rx has been filled from 05/01/14.

## 2014-06-23 ENCOUNTER — Encounter (HOSPITAL_COMMUNITY): Payer: Self-pay | Admitting: Emergency Medicine

## 2014-06-23 ENCOUNTER — Emergency Department (HOSPITAL_COMMUNITY)
Admission: EM | Admit: 2014-06-23 | Discharge: 2014-06-23 | Disposition: A | Discharge status: Home / Self Care | Payer: BC Managed Care – PPO | Attending: Emergency Medicine | Admitting: Emergency Medicine

## 2014-06-23 DIAGNOSIS — J45909 Unspecified asthma, uncomplicated: Secondary | ICD-10-CM | POA: Insufficient documentation

## 2014-06-23 DIAGNOSIS — F329 Major depressive disorder, single episode, unspecified: Secondary | ICD-10-CM | POA: Insufficient documentation

## 2014-06-23 DIAGNOSIS — M5412 Radiculopathy, cervical region: Secondary | ICD-10-CM

## 2014-06-23 DIAGNOSIS — M549 Dorsalgia, unspecified: Secondary | ICD-10-CM | POA: Insufficient documentation

## 2014-06-23 DIAGNOSIS — M25519 Pain in unspecified shoulder: Secondary | ICD-10-CM | POA: Insufficient documentation

## 2014-06-23 DIAGNOSIS — I10 Essential (primary) hypertension: Secondary | ICD-10-CM | POA: Insufficient documentation

## 2014-06-23 DIAGNOSIS — Z8669 Personal history of other diseases of the nervous system and sense organs: Secondary | ICD-10-CM | POA: Insufficient documentation

## 2014-06-23 DIAGNOSIS — F17200 Nicotine dependence, unspecified, uncomplicated: Secondary | ICD-10-CM | POA: Insufficient documentation

## 2014-06-23 DIAGNOSIS — Z79899 Other long term (current) drug therapy: Secondary | ICD-10-CM | POA: Insufficient documentation

## 2014-06-23 LAB — CBC WITH DIFFERENTIAL/PLATELET
Basophils Absolute: 0 10*3/uL (ref 0.0–0.1)
Basophils Relative: 0 % (ref 0–1)
Eosinophils Absolute: 0.1 10*3/uL (ref 0.0–0.7)
Eosinophils Relative: 2 % (ref 0–5)
HCT: 43.1 % (ref 36.0–46.0)
Hemoglobin: 14.2 g/dL (ref 12.0–15.0)
Lymphocytes Relative: 39 % (ref 12–46)
Lymphs Abs: 2.4 10*3/uL (ref 0.7–4.0)
MCH: 31.1 pg (ref 26.0–34.0)
MCHC: 32.9 g/dL (ref 30.0–36.0)
MCV: 94.5 fL (ref 78.0–100.0)
Monocytes Absolute: 0.4 10*3/uL (ref 0.1–1.0)
Monocytes Relative: 6 % (ref 3–12)
Neutro Abs: 3.3 10*3/uL (ref 1.7–7.7)
Neutrophils Relative %: 53 % (ref 43–77)
Platelets: 279 10*3/uL (ref 150–400)
RBC: 4.56 MIL/uL (ref 3.87–5.11)
RDW: 15.2 % (ref 11.5–15.5)
WBC: 6.2 10*3/uL (ref 4.0–10.5)

## 2014-06-23 LAB — COMPREHENSIVE METABOLIC PANEL
ALT: 10 U/L (ref 0–35)
AST: 13 U/L (ref 0–37)
Albumin: 3.9 g/dL (ref 3.5–5.2)
Alkaline Phosphatase: 99 U/L (ref 39–117)
Anion gap: 10 (ref 5–15)
BUN: 9 mg/dL (ref 6–23)
CO2: 28 mEq/L (ref 19–32)
Calcium: 9.1 mg/dL (ref 8.4–10.5)
Chloride: 100 mEq/L (ref 96–112)
Creatinine, Ser: 0.8 mg/dL (ref 0.50–1.10)
GFR calc Af Amer: >90 mL/min (ref >90)
GFR calc non Af Amer: 85 mL/min — ABNORMAL LOW (ref >90)
Glucose, Bld: 90 mg/dL (ref 70–99)
Potassium: 4.3 mEq/L (ref 3.7–5.3)
Sodium: 138 mEq/L (ref 137–147)
Total Bilirubin: 0.3 mg/dL (ref 0.3–1.2)
Total Protein: 7.6 g/dL (ref 6.0–8.3)

## 2014-06-23 LAB — I-STAT TROPONIN, ED: TROPONIN I, POC: 0 ng/mL (ref 0.00–0.08)

## 2014-06-23 MED ORDER — DEXAMETHASONE SODIUM PHOSPHATE 10 MG/ML IJ SOLN
10.0000 mg | Freq: Once | INTRAMUSCULAR | Status: AC
  Administered 2014-06-23: 10 mg via INTRAVENOUS
  Filled 2014-06-23: qty 1

## 2014-06-23 MED ORDER — HYDROMORPHONE HCL PF 1 MG/ML IJ SOLN
1.0000 mg | Freq: Once | INTRAMUSCULAR | Status: DC
  Filled 2014-06-23: qty 1

## 2014-06-23 MED ORDER — PREDNISONE 50 MG PO TABS: 50.0000 mg | ORAL_TABLET | Freq: Every day | ORAL | Status: DC

## 2014-06-23 MED ORDER — OXYCODONE-ACETAMINOPHEN 5-325 MG PO TABS
1.0000 | ORAL_TABLET | Freq: Once | ORAL | Status: AC
  Administered 2014-06-23: 1 via ORAL
  Filled 2014-06-23: qty 1

## 2014-06-23 MED ORDER — KETOROLAC TROMETHAMINE 60 MG/2ML IM SOLN
60.0000 mg | Freq: Once | INTRAMUSCULAR | Status: AC
  Administered 2014-06-23: 60 mg via INTRAMUSCULAR
  Filled 2014-06-23: qty 2

## 2014-06-23 MED ORDER — HYDROMORPHONE HCL PF 1 MG/ML IJ SOLN
1.0000 mg | Freq: Once | INTRAMUSCULAR | Status: AC
  Administered 2014-06-23: 1 mg via INTRAVENOUS

## 2014-06-23 MED ORDER — OXYCODONE-ACETAMINOPHEN 5-325 MG PO TABS: 1.0000 | ORAL_TABLET | Freq: Four times a day (QID) | ORAL | Status: DC | PRN

## 2014-06-23 MED ORDER — CYCLOBENZAPRINE HCL 10 MG PO TABS: 10.0000 mg | ORAL_TABLET | Freq: Three times a day (TID) | ORAL | Status: DC | PRN

## 2014-06-23 NOTE — Discharge Instructions (Signed)
Return here as needed.  He should follow up closely with your primary care Dr. use ice and heat on your back in that

## 2014-06-23 NOTE — ED Notes (Signed)
Family at bedside. 

## 2014-06-23 NOTE — ED Provider Notes (Signed)
CSN: [id number]     Arrival date & time 06/23/14  9833 History   First MD Initiated Contact with Patient 06/23/14 1126     Chief Complaint  Patient presents with  . Back Pain  . Shoulder Pain     (Consider location/radiation/quality/duration/timing/severity/associated sxs/prior Treatment) HPI Patient presents to the emergency department with neck and upper back pain started this morning when she woke up.  The patient, states she has radiating pain down her arm from her neck and upper back.  Patient, states, that she does not normally have neck problems.  Patient, states, that movement makes the pain, worse.  Patient denies chest pain, shortness breath, nausea, vomiting, diarrhea, weakness, headache, blurred vision, dizziness, fever, dysuria, incontinence, altered mental status, or syncope.  The patient, states, that she did not take any medications prior to arrival.  Patient, states, that this is make her condition, better.  Patient had tingling in her right index finger for several hours after the pain started Past Medical History  Diagnosis Date  . Tobacco abuse   . Asthma     Required inhaled corticosteroid in past.   . Hypertension     2 drug therapy  . Severe obesity (BMI >= 40) 10/08/2012    Obesity Class 3. BMI > 40.   Marland Kitchen OSA (obstructive sleep apnea)     Sleep study 12/2006 : Moderately Severe OSA. AHI 38.8. O2 sat decreased to 71%.  . 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 Prior episodes that did not require pt to seek medical tx.   Past Surgical History  Procedure Laterality Date  . Laparotomy      for tubal pregnancy  . Tubal ligation    . Dilation and curettage of uterus    . Ectopic pregnancy surgery     Family History  Problem Relation Age of Onset  . Diabetes Mother   . Hypertension Mother   . Stroke Father   . Heart disease Father     died <47 y.o  . Diabetes Maternal Aunt   . Cancer Maternal Aunt     breast   . Heart disease  Maternal Grandmother   . Arthritis Mother   . Hyperlipidemia Mother   . Asthma Maternal Aunt    History  Substance Use Topics  . Smoking status: Light Tobacco Smoker -- 0.03 packs/day for 30 years    Types: Cigarettes    Last Attempt to Quit: 09/20/2013  . Smokeless tobacco: Never Used     Comment: 2 cigs per day  . Alcohol Use: Yes     Comment: rarely.   OB History   Grav Para Term Preterm Abortions TAB SAB Ect Mult Living                 Review of Systems  All other systems negative except as documented in the HPI. All pertinent positives and negatives as reviewed in the HPI.  Allergies  Review of patient's allergies indicates no known allergies.  Home Medications   Prior to Admission medications   Medication Sig Start Date End Date Taking? Authorizing Provider  diphenhydrAMINE (BENADRYL) 25 mg capsule Take 25 mg by mouth every 6 (six) hours as needed.   Yes Historical Provider, MD  fexofenadine (ALLEGRA) 180 MG tablet Take 180 mg by mouth daily as needed for allergies or rhinitis.   Yes Historical Provider, MD  lisinopril-hydrochlorothiazide (PRINZIDE,ZESTORETIC) 20-25 MG per tablet TAKE ONE TABLET BY MOUTH ONCE DAILY 05/01/14  Yes Bartholomew Crews, MD  tiZANidine (ZANAFLEX) 4 MG tablet Take 4 mg by mouth at bedtime as needed for muscle spasms.   Yes Historical Provider, MD  traMADol (ULTRAM) 50 MG tablet Take 50 mg by mouth every 6 (six) hours as needed.  12/25/13  Yes Historical Provider, MD  albuterol (PROVENTIL HFA;VENTOLIN HFA) 108 (90 BASE) MCG/ACT inhaler Inhale 2 puffs into the lungs every 6 (six) hours as needed for wheezing.    Historical Provider, MD   BP 140/87  Pulse 72  Temp(Src) 97.5 F (36.4 C) (Oral)  Resp 15  Ht 5\' 1"  (1.549 m)  Wt 212 lb (96.163 kg)  BMI 40.08 kg/m2  SpO2 99% Physical Exam  Constitutional: She is oriented to person, place, and time. She appears well-developed and well-nourished. No distress.  HENT:  Head: Normocephalic and  atraumatic.  Mouth/Throat: Oropharynx is clear and moist.  Eyes: Pupils are equal, round, and reactive to light.  Neck: Normal range of motion. Neck supple.  Cardiovascular: Normal rate, regular rhythm and normal heart sounds.  Exam reveals no gallop and no friction rub.   No murmur heard. Pulmonary/Chest: Effort normal and breath sounds normal. No respiratory distress.  Musculoskeletal:       Back:  Neurological: She is alert and oriented to person, place, and time. She has normal strength. No sensory deficit. She exhibits normal muscle tone. Coordination and gait normal. GCS eye subscore is 4. GCS verbal subscore is 5. GCS motor subscore is 6.  Skin: Skin is warm and dry. No erythema.    ED Course  Procedures (including critical care time) Labs Review Labs Reviewed  COMPREHENSIVE METABOLIC PANEL - Abnormal; Notable for the following:    GFR calc non Af Amer 85 (*)    All other components within normal limits  CBC WITH DIFFERENTIAL  URINALYSIS, ROUTINE W REFLEX MICROSCOPIC  I-STAT TROPOININ, ED    Patient did not have any injury or trauma.  The patient most likely has a cervical radiculopathy.  I advised him to followup with her primary care Dr. for further evaluation and care.  Patient does not have any neurological deficits noted on exam.  She has normal strength in all 4 extremities.  Patient does not have any sensory deficits on exam.    Brent General, PA-C 06/23/14 1439

## 2014-06-23 NOTE — ED Notes (Signed)
Hot pack was given to patient for the discomfort of her shoulder pain.

## 2014-06-23 NOTE — ED Provider Notes (Signed)
Medical screening examination/treatment/procedure(s) were performed by non-physician practitioner and as supervising physician I was immediately available for consultation/collaboration.   EKG Interpretation None        Evelina Bucy, MD 06/23/14 1941

## 2014-06-23 NOTE — ED Notes (Addendum)
Pt c/o pain to right upper back/shoulder blade pain that radiates down right arm causing numbness in fingers onset today. Pt denies recent injury. Pt denies SOB, diaphoresis, N/V. Speech clear, no facial droop, equal grips.

## 2014-06-23 NOTE — ED Notes (Signed)
Patient is alert and orientedx4.  Patient was explained discharge instructions and they understood them with no questions.  The patient's mother and father are taking her home.

## 2014-06-25 ENCOUNTER — Ambulatory Visit (INDEPENDENT_AMBULATORY_CARE_PROVIDER_SITE_OTHER): Payer: BC Managed Care – PPO | Admitting: Internal Medicine

## 2014-06-25 ENCOUNTER — Encounter: Payer: Self-pay | Admitting: Internal Medicine

## 2014-06-25 VITALS — BP 160/110 | HR 75 | Temp 97.6°F | Wt 212.2 lb

## 2014-06-25 DIAGNOSIS — Z720 Tobacco use: Secondary | ICD-10-CM

## 2014-06-25 DIAGNOSIS — M5412 Radiculopathy, cervical region: Secondary | ICD-10-CM

## 2014-06-25 DIAGNOSIS — G4733 Obstructive sleep apnea (adult) (pediatric): Secondary | ICD-10-CM | POA: Diagnosis not present

## 2014-06-25 DIAGNOSIS — F17200 Nicotine dependence, unspecified, uncomplicated: Secondary | ICD-10-CM

## 2014-06-25 DIAGNOSIS — M5010 Cervical disc disorder with radiculopathy, unspecified cervical region: Secondary | ICD-10-CM

## 2014-06-25 DIAGNOSIS — F325 Major depressive disorder, single episode, in full remission: Secondary | ICD-10-CM

## 2014-06-25 DIAGNOSIS — J45909 Unspecified asthma, uncomplicated: Secondary | ICD-10-CM | POA: Diagnosis not present

## 2014-06-25 DIAGNOSIS — I10 Essential (primary) hypertension: Secondary | ICD-10-CM | POA: Diagnosis not present

## 2014-06-25 DIAGNOSIS — J4520 Mild intermittent asthma, uncomplicated: Secondary | ICD-10-CM

## 2014-06-25 MED ORDER — GABAPENTIN 300 MG PO CAPS: 300.0000 mg | ORAL_CAPSULE | Freq: Three times a day (TID) | ORAL | Status: DC

## 2014-06-25 NOTE — Assessment & Plan Note (Signed)
Denies depression at this time. On no meds. Will cont to monitor.

## 2014-06-25 NOTE — Patient Instructions (Signed)
1. Take the prednisone, gabapentin, Aleve or Ibuprofen (with food), flexeril as instructed 2. Start the gabapentin twice a day for 3 days and then increase to three times daily 3. Use heat if it feels good 4. Come to ED ASAP  Urine or fecal incontinence  Weakness  Worsening numbness  Fever 5. See me one week 6. Your pain will decrease slowly over at least 6 weeks

## 2014-06-25 NOTE — Progress Notes (Signed)
   Subjective:    Patient ID: Crystal English, female    DOB: 09-25-1966, 48 y.o.   MRN: [id number]  Arm Pain  Associated symptoms include numbness. Pertinent negatives include no chest pain.    Please see the A&P for the status of the pt's chronic medical problems.   Review of Systems  Constitutional: Negative for fever, activity change, appetite change and unexpected weight change.  Cardiovascular: Negative for chest pain.  Gastrointestinal:       No incontinence   Genitourinary:       No urinary incontinence   Musculoskeletal: Positive for back pain. Negative for gait problem.  Skin: Negative for rash.  Neurological: Positive for numbness.  Psychiatric/Behavioral: Positive for sleep disturbance.       Objective:   Physical Exam  Constitutional: She is oriented to person, place, and time. She appears well-developed and well-nourished. No distress.  HENT:  Head: Normocephalic and atraumatic.  Right Ear: External ear normal.  Left Ear: External ear normal.  Nose: Nose normal.  Eyes: Conjunctivae and EOM are normal.  Musculoskeletal: Normal range of motion. She exhibits tenderness. She exhibits no edema.  She is at times rocking in pain. She freq holds her R arm with her L arm. She carried her purse in her L hand. Sig tenderness to palp over mid lower cervical spine. + shock like pain that radiates down R arm with neck flexion. Negative shoulder abd test. Nl ROM. Strength 5/5 all RUE muscles grps. Swelling or R 3rd and 2nd digit. Altered sensation mostly R 2nd and 3rd digit, less so on palm of R hand. sens proximal to wrist OK.  Neurological: She is alert and oriented to person, place, and time.  Skin: Skin is warm and dry. She is not diaphoretic.  Psychiatric: She has a normal mood and affect. Her behavior is normal. Judgment and thought content normal.          Assessment & Plan:

## 2014-06-25 NOTE — Assessment & Plan Note (Addendum)
Smoking 1-3 cig per day. She has been slowly titrating down. States she might get OTC nicotine patches and I explained how they work and that she would need the lowest dose. I explained that there were other meds if she was interested.    Assessment: Progress toward smoking cessation:  smoking less Barriers to progress toward smoking cessation:  none Comments: See above  Plan: Instruction/counseling given:  I counseled patient on the dangers of tobacco use, advised patient to stop smoking, and reviewed strategies to maximize success. Educational resources provided:    Self management tools provided:    Medications to assist with smoking cessation:  None Patient agreed to the following self-care plans for smoking cessation:    Other plans: See above

## 2014-06-25 NOTE — Assessment & Plan Note (Addendum)
On the 3rd, she had some twinges in the upper back region but this was not unusual for her. On the 4th, she woke up and was OK. Went to work and pain started and quickly bc severe. Her fingers then bc numb and she drove herself to the ED. Her Trop and EKG were negative. She got opioids in the ED but states it didn't decrease her pain much. She also has noticed that her hand is swollen, she couldn't write her name, and she dropped the pen, she thinks bc of numbness. She was d/c dx of radiculopathy and dc with steroids, oxycodone, and flexeril. She is also taking NSAID and using heating pad. The pain was not preceded by an accident or injury or overuse.   Exam is c/w C7 / C8 nerve impingement. There is no motor weakness and no red flag signs but does have numbness. Gave her red flag signs and she will see me in one week. I also added Gabapentin (has been on in past and did OK) to her med regimen. Explained no need for MRI now as insurance will not cover and will not change plans.   PLAN 1. Cont prednisone, flexeril, oxycodone, heat. Add Gabapentin 2. Return in 1 week.

## 2014-06-25 NOTE — Assessment & Plan Note (Signed)
Doesn't have CPAP and never used it. WIll need to address at a future appt

## 2014-06-25 NOTE — Assessment & Plan Note (Signed)
Still taking the Lisinopril HCTZ 20-25. She doesn't know if her allergist is OK with this but has appt later this month. She never got the ARB 2/2 cost. Her hives have cleared up. Her BP is elevated today but she is in severe pain. Will cont to follow.

## 2014-06-25 NOTE — Assessment & Plan Note (Signed)
Has alb but never requires it.

## 2014-06-26 ENCOUNTER — Telehealth: Payer: Self-pay | Admitting: *Deleted

## 2014-06-26 ENCOUNTER — Encounter: Payer: Self-pay | Admitting: Internal Medicine

## 2014-06-26 ENCOUNTER — Ambulatory Visit (INDEPENDENT_AMBULATORY_CARE_PROVIDER_SITE_OTHER): Payer: BC Managed Care – PPO | Admitting: Internal Medicine

## 2014-06-26 ENCOUNTER — Ambulatory Visit (HOSPITAL_COMMUNITY)
Admission: RE | Admit: 2014-06-26 | Discharge: 2014-06-26 | Disposition: A | Discharge status: Home / Self Care | Payer: BC Managed Care – PPO | Source: Ambulatory Visit | Attending: Internal Medicine | Admitting: Internal Medicine

## 2014-06-26 VITALS — BP 174/125 | HR 80 | Temp 97.6°F | Wt 213.7 lb

## 2014-06-26 DIAGNOSIS — M5010 Cervical disc disorder with radiculopathy, unspecified cervical region: Secondary | ICD-10-CM

## 2014-06-26 DIAGNOSIS — M47812 Spondylosis without myelopathy or radiculopathy, cervical region: Secondary | ICD-10-CM | POA: Insufficient documentation

## 2014-06-26 DIAGNOSIS — M5412 Radiculopathy, cervical region: Secondary | ICD-10-CM | POA: Diagnosis not present

## 2014-06-26 DIAGNOSIS — I10 Essential (primary) hypertension: Secondary | ICD-10-CM | POA: Diagnosis not present

## 2014-06-26 MED ORDER — DICLOFENAC SODIUM 50 MG PO TBEC: 50.0000 mg | DELAYED_RELEASE_TABLET | Freq: Two times a day (BID) | ORAL | Status: DC

## 2014-06-26 NOTE — Telephone Encounter (Signed)
Pt called - pain right arm is worse and right fingers are still numb. Pt  crying.  Pt has tried Flexeril, gabapentin, tramadol and oxycodone-actaminophen - no relief. Appt made for today 10:45AM Dr Naaman Plummer. Hilda Blades Iasia Forcier RN 06/26/14 9:50AM

## 2014-06-26 NOTE — Patient Instructions (Addendum)
-  Take diclofenac 50 mg twice a day, stop taking tramadol and other OTC NSAID's -Take your other medications as prescribed -Will get an xray of your neck and refer you to neurosurgery -You will see Dr. Lynnae January next Thursday, pleasure meeting you!    General Instructions:   Please try to bring all your medicines next time. This will help [place] keep you safe from mistakes.   Progress Toward Treatment Goals:  Treatment Goal 06/25/2014  Blood pressure deteriorated  Stop smoking smoking less  Prevent falls -    Self Care Goals & Plans:  Self Care Goal 05/12/2014  Manage my medications take my medicines as prescribed; bring my medications to every visit; refill my medications on time  Monitor my health -  Eat healthy foods drink diet soda or water instead of juice or soda; eat more vegetables; eat foods that are low in salt; eat baked foods instead of fried foods; eat fruit for snacks and desserts  Be physically active -  Prevent falls -  Other -    No flowsheet data found.   Care Management & Community Referrals:  Referral 06/25/2014  Referrals made for care management support none needed  Referrals made to community resources -

## 2014-06-26 NOTE — Progress Notes (Signed)
Patient ID: Crystal English, female   DOB: [birth year], 48 y.o.   MRN: [id number]    Subjective:   Patient ID: Crystal English female   DOB: 02-03-1966 48 y.o.   MRN: [id number]  HPI: [name] is a 48 y.o. pleasant woman with past medical history of hypertension, asthma, depression, OSA, and tobacco abuse who presents with chief complaint of right arm and neck pain.   She reports acute onset of right arm pain that began three days ago. She was just seen by her PCP yesterday and reports persistent right arm pain unrelieved with oxycodone, tramadol, flexiril, prednisone, and newly added gabapentin. Her pain now extends into her neck and numbness continues to be present in her three right fingers. She reports previously being on diclofenac for back pain that she wants to try. She denies headache, vision change, dysarthria, weakness, or imbalance.       Past Medical History  Diagnosis Date  . Tobacco abuse   . Asthma     Required inhaled corticosteroid in past.   . Hypertension     2 drug therapy  . Severe obesity (BMI >= 40) 10/08/2012    Obesity Class 3. BMI > 40.   Marland Kitchen OSA (obstructive sleep apnea)     Sleep study 12/2006 : Moderately Severe OSA. AHI 38.8. O2 sat decreased to 71%.  . 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 Prior episodes that did not require pt to seek medical tx.   Current Outpatient Prescriptions  Medication Sig Dispense Refill  . albuterol (PROVENTIL HFA;VENTOLIN HFA) 108 (90 BASE) MCG/ACT inhaler Inhale 2 puffs into the lungs every 6 (six) hours as needed for wheezing.      . cyclobenzaprine (FLEXERIL) 10 MG tablet Take 1 tablet (10 mg total) by mouth 3 (three) times daily as needed for muscle spasms.  15 tablet  0  . diphenhydrAMINE (BENADRYL) 25 mg capsule Take 25 mg by mouth every 6 (six) hours as needed.      . fexofenadine (ALLEGRA) 180 MG tablet Take 180 mg by mouth daily as needed for allergies or rhinitis.        Marland Kitchen gabapentin (NEURONTIN) 300 MG capsule Take 1 capsule (300 mg total) by mouth 3 (three) times daily.  90 capsule  2  . lisinopril-hydrochlorothiazide (PRINZIDE,ZESTORETIC) 20-25 MG per tablet TAKE ONE TABLET BY MOUTH ONCE DAILY  30 tablet  11  . oxyCODONE-acetaminophen (PERCOCET/ROXICET) 5-325 MG per tablet Take 1 tablet by mouth every 6 (six) hours as needed for severe pain.  15 tablet  0  . predniSONE (DELTASONE) 50 MG tablet Take 1 tablet (50 mg total) by mouth daily.  7 tablet  0  . tiZANidine (ZANAFLEX) 4 MG tablet Take 4 mg by mouth at bedtime as needed for muscle spasms.      . traMADol (ULTRAM) 50 MG tablet Take 50 mg by mouth every 6 (six) hours as needed.        No current facility-administered medications for this visit.   Family History  Problem Relation Age of Onset  . Diabetes Mother   . Hypertension Mother   . Stroke Father     in his 38's  . Heart disease Father     died <47 y.o  . Diabetes Maternal Aunt   . Cancer Maternal Aunt     breast   . Heart disease Maternal Grandmother   . Arthritis Mother   . Hyperlipidemia Mother   .  Asthma Maternal Aunt    History   Social History  . Marital Status: Single    Spouse Name: N/A    Number of Children: 3  . Years of Education: some coll.   Occupational History  . Claims processing    Social History Main Topics  . Smoking status: Light Tobacco Smoker -- 0.03 packs/day for 30 years    Types: Cigarettes    Last Attempt to Quit: 09/20/2013  . Smokeless tobacco: Never Used     Comment: 2 cigs per day  . Alcohol Use: Yes     Comment: rarely.  . Drug Use: No  . Sexual Activity: None   Other Topics Concern  . None   Social History Narrative   Lives in [place] alone. 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   Review of Systems: Review of Systems  Constitutional: Negative for fever and chills.  Eyes: Negative for blurred vision.  Respiratory: Negative for shortness of breath.   Cardiovascular: Negative for  chest pain.  Gastrointestinal: Negative for nausea, vomiting, abdominal pain, diarrhea and constipation.  Genitourinary: Negative for dysuria, urgency and frequency.  Musculoskeletal: Positive for neck pain.       Right arm pain  Neurological: Positive for tingling (right three middle fingers). Negative for dizziness, speech change, focal weakness and headaches.    Objective:  Physical Exam: Filed Vitals:   06/26/14 1106  BP: 174/125  Pulse: 80  Temp: 97.6 F (36.4 C)  TempSrc: Oral  Weight: 213 lb 11.2 oz (96.934 kg)  SpO2: 99%    Physical Exam  Constitutional: She is oriented to person, place, and time. She appears well-developed and well-nourished. She appears distressed (in pain).  HENT:  Head: Normocephalic and atraumatic.  Eyes: EOM are normal.  Neck: Normal range of motion. Neck supple.  Paraspinal cervical tenderness  Cardiovascular: Normal rate, regular rhythm and normal heart sounds.   Pulmonary/Chest: Effort normal and breath sounds normal. No respiratory distress. She has no wheezes. She has no rales.  Abdominal: Soft. Bowel sounds are normal. She exhibits no distension. There is no tenderness. There is no rebound and no guarding.  Musculoskeletal: Normal range of motion. She exhibits no edema and no tenderness.  Normal ROM of right shoulder  Neurological: She is alert and oriented to person, place, and time. No cranial nerve deficit.  Normal 5/5 muscle strength throughout with decreased sensation of right three middle hand digits  Skin: Skin is warm and dry. She is not diaphoretic.  Psychiatric: She has a normal mood and affect. Her behavior is normal. Judgment and thought content normal.    Assessment & Plan:   Please see problem list for problem-based assessment and plan

## 2014-06-26 NOTE — Assessment & Plan Note (Addendum)
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    Plan: -Obtain xray cervical spine ----> moderately severe disc space narrowing at C6-7,  C5-6 and mild at C4-5. Also with facet osteoarthritic change with exit foraminal narrowing at C4-5, C5-6, and C6-7 bilaterally, most notably at C6-7 bilaterally.  -Discontinue tramadol as pt already on narcotic therapy  -Start diclofenac 50 mg BID, pt instructed to discontinue additional NSAID use -Continue prednisone 50 mg daily for 7 days, oxcycodone-acetaminophen 5-325 mg Q 6 hr PRN , flexiril 10 mg TID PRN, gabapentin 300 mg TID, and heat pads -Refer to neurosurgery for further management  -To return in 1 week to follow-up with Dr. Lynnae January

## 2014-06-26 NOTE — Assessment & Plan Note (Signed)
Assessment: Pt with moderately well-controlled hypertension compliant with two-class (ACEi & diuretic) anti-hypertensive therapy who presents with blood pressure of 174/125 in setting of severe pain.   Plan: -BP 174/125 not at goal <140/90 however in setting of severe pain  -Continue lisinopril-HCTZ 20-25 mg daily  -Last CMP on 06/23/14 was normal -Continue to monitor

## 2014-06-29 ENCOUNTER — Encounter: Payer: Self-pay | Admitting: Internal Medicine

## 2014-06-29 NOTE — Progress Notes (Signed)
Case discussed with Dr. Rabbani at the time of the visit.  We reviewed the resident's history and exam and pertinent patient test results.  I agree with the assessment, diagnosis, and plan of care documented in the resident's note. 

## 2014-07-01 ENCOUNTER — Ambulatory Visit: Payer: BC Managed Care – PPO | Admitting: Internal Medicine

## 2014-07-02 ENCOUNTER — Encounter: Payer: Self-pay | Admitting: Internal Medicine

## 2014-07-02 ENCOUNTER — Ambulatory Visit (INDEPENDENT_AMBULATORY_CARE_PROVIDER_SITE_OTHER): Payer: BC Managed Care – PPO | Admitting: Internal Medicine

## 2014-07-02 ENCOUNTER — Ambulatory Visit: Payer: BC Managed Care – PPO | Admitting: Internal Medicine

## 2014-07-02 VITALS — BP 155/104 | HR 87 | Temp 97.0°F | Ht 61.0 in | Wt 221.0 lb

## 2014-07-02 DIAGNOSIS — M5010 Cervical disc disorder with radiculopathy, unspecified cervical region: Secondary | ICD-10-CM

## 2014-07-02 DIAGNOSIS — M5412 Radiculopathy, cervical region: Secondary | ICD-10-CM | POA: Diagnosis not present

## 2014-07-02 MED ORDER — OXYCODONE-ACETAMINOPHEN 5-325 MG PO TABS: 1.0000 | ORAL_TABLET | Freq: Three times a day (TID) | ORAL | Status: DC | PRN

## 2014-07-02 MED ORDER — CYCLOBENZAPRINE HCL 10 MG PO TABS: 10.0000 mg | ORAL_TABLET | Freq: Three times a day (TID) | ORAL | Status: DC | PRN

## 2014-07-02 NOTE — Patient Instructions (Signed)
1. I am researching nerve root injection.

## 2014-07-02 NOTE — Progress Notes (Signed)
   Subjective:    Patient ID: Crystal English, female    DOB: August 18, 1966, 48 y.o.   MRN: [id number]  HPI  Please see the A&P for the status of the pt's chronic medical problems.    Review of Systems  Constitutional: Negative for unexpected weight change.       Gained wt but expected  Respiratory: Negative for chest tightness and shortness of breath.   Cardiovascular: Negative for chest pain.  Neurological: Positive for numbness.       No pain / numbness except in the RUE. No bowel / bladder incontinence.   Psychiatric/Behavioral: Positive for sleep disturbance.       Objective:   Physical Exam  Constitutional: She is oriented to person, place, and time. She appears well-developed and well-nourished. No distress.  HENT:  Head: Normocephalic and atraumatic.  Right Ear: External ear normal.  Left Ear: External ear normal.  Nose: Nose normal.  Eyes: Conjunctivae and EOM are normal.  Musculoskeletal: Normal range of motion. She exhibits no edema and no tenderness.  Sl tenderness to palp R cervical posterior neck midline  Neurological: She is alert and oriented to person, place, and time.  Numbness R 2nd and third digit. Altered sensation palm and 4th finger. Strength 5/5 RUE.   Skin: Skin is warm. She is not diaphoretic.  Psychiatric: She has a normal mood and affect. Her behavior is normal. Judgment and thought content normal.          Assessment & Plan:

## 2014-07-02 NOTE — Assessment & Plan Note (Addendum)
Currently on Gaba, oxycodone, flexeril, and diclofenac. Also using heat. Finished prednisone. Has returned to work part time. I remain confident that this is C7 nerve root impingement on R. She is frustrated that the pain is not abating. The heat helps and is now working part time bc of money issues but would prefer to be at home resting. We again discussed typical course - will take weeks to slowly decrease pain. There were no bony mets on plain film but there were sig degenerative changes. I was able to discuss with IR and they are open to injections this early in the dz course - their only concern is that one more week of conservative tx might see a decrease in pain. They would rec an MRI prior to injection. I discussed with pt her options and at this time she wants to cont conservative tx. There are no red flags to jump to imaging or referral today. Will see back in 2-4 weeks. I refilled flexeril and oxycodone.  PLAN 1. Cont gaba, oxycodone, flexeril, and diclofenac 2. If she wants to go MRI and injection, she is to call me and I will arrange 3. RTC 2-4 weeks 4. To ER for red flag sxs.

## 2014-07-14 ENCOUNTER — Encounter: Payer: Self-pay | Admitting: Internal Medicine

## 2014-07-14 ENCOUNTER — Telehealth: Payer: Self-pay | Admitting: *Deleted

## 2014-07-14 NOTE — Telephone Encounter (Signed)
I have the letter in [facility] but it is not signed. Will be down tomorrow to print and sign.

## 2014-07-14 NOTE — Telephone Encounter (Signed)
Pt called asking for a note stating she can return to work full time. Pt states she worked 8 hours yesterday and is working today.  She does well and gets a little tired in the evening.   She feels she is able to work 40 hours a week.  I will fax letter to :  [employer]                                     [phone]

## 2014-07-15 NOTE — Telephone Encounter (Signed)
Dr Software engineer, pt calls today and would like for you to write the letter as ask but add the stipulation  that she will need regular breaks to rest her arm as she does have arm pain with exertion. Ph# [phone]

## 2014-07-16 ENCOUNTER — Encounter: Payer: Self-pay | Admitting: Internal Medicine

## 2014-07-16 NOTE — Telephone Encounter (Signed)
Letter left in triage office.

## 2014-07-22 ENCOUNTER — Encounter: Payer: Self-pay | Admitting: Internal Medicine

## 2014-07-22 ENCOUNTER — Ambulatory Visit (INDEPENDENT_AMBULATORY_CARE_PROVIDER_SITE_OTHER): Payer: BC Managed Care – PPO | Admitting: Internal Medicine

## 2014-07-22 VITALS — BP 126/78 | HR 97 | Ht 60.0 in | Wt 213.2 lb

## 2014-07-22 DIAGNOSIS — L508 Other urticaria: Secondary | ICD-10-CM

## 2014-07-22 DIAGNOSIS — F17200 Nicotine dependence, unspecified, uncomplicated: Secondary | ICD-10-CM

## 2014-07-22 DIAGNOSIS — Z720 Tobacco use: Secondary | ICD-10-CM

## 2014-07-22 NOTE — Patient Instructions (Signed)
We will wait on allergy skin testing- that could be done some other time if there were reason.  Suggest for now, use an antihistamine like Allegra once daily as needed. If the hives keep coming back, then ask Dr Lynnae January to switch you off lisinopril(ACEI) to a different class of BP medicine, perhaps an ARB, like the Benicar HCT you tried before.   We will be happy to see you again as needed

## 2014-07-22 NOTE — Progress Notes (Signed)
05/14/14- 89 yoF former smoker COMPLAINS OF:  Having hives over past 8 months.  Appear all over body, randomly come Medical problems including urticaria, asthma,obesity, OSA, HBP, depression New onset of hives about 8 months ago. Generalized urticaria most days. Hydroxyzine helps but makes her sleepy. She has been on lisinopril a long time-association possible. No other trigger identified, except possibly heat. She has not considered herself an allergic/atopic person and there is no family history of hives.  She is not pregnant. No food intolerance. No known neoplasia or chronic infection.  07/21/14-48 yoF former smoker followed for urticaria No antihistamines, OTC cough syrups, or OTC sleep aids in past 3 days. We had switched from lisinopril to Benicar HCT> Allergy Profile and Food IgE profile - 05/14/14- Total IgE 183, specific elevations only for candida and milk No hives in several weeks or more, until today. She credits daily Allegra. Hives stopped before she went to emergency room for treatment of pinched nerve, and Voltaren was replaced with oxycodone. She went back on lisinopril in late July and says she had her first hives finally today on her hand.   ROS-see HPI Constitutional:   No-   weight loss, night sweats, fevers, chills, fatigue, lassitude. HEENT:   No-  headaches, difficulty swallowing, tooth/dental problems, sore throat,       No-  sneezing, itching, ear ache, nasal congestion, post nasal drip,  CV:  No-   chest pain, orthopnea, PND, swelling in lower extremities, anasarca,                                  dizziness, palpitations Resp: No-   shortness of breath with exertion or at rest.              No-   productive cough,  No non-productive cough,  No- coughing up of blood.              No-   change in color of mucus.  No- wheezing.   Skin: +HPI GI:  No-   heartburn, indigestion, abdominal pain, nausea, vomiting,  GU:  MS:  No-   joint pain or swelling.  Neuro-     nothing  unusual Psych:  No- change in mood or affect. No depression or anxiety.  No memory loss.  OBJ- Physical Exam General- Alert, Oriented, Affect-appropriate, Distress- none acute Skin- + small hive on the thumb web right hand Lymphadenopathy- none Head- atraumatic            Eyes- Gross vision intact, PERRLA, conjunctivae and secretions clear            Ears- Hearing, canals-normal            Nose- Clear, no-Septal dev, mucus, polyps, erosion, perforation             Throat- Mallampati II , mucosa clear , drainage- none, tonsils- atrophic Neck- flexible , trachea midline, no stridor , thyroid nl, carotid no bruit Chest - symmetrical excursion , unlabored           Heart/CV- RRR , no murmur , no gallop  , no rub, nl s1 s2                           - JVD- none , edema- none, stasis changes- none, varices- none           Lung- clear to  P&A, wheeze- none, cough- none , dullness-none, rub- none           Chest wall-  Abd-  Br/ Gen/ Rectal- Not done, not indicated Extrem- cyanosis- none, clubbing, none, atrophy- none, strength- nl Neuro- grossly intact to observation

## 2014-07-27 ENCOUNTER — Encounter: Payer: Self-pay | Admitting: Internal Medicine

## 2014-07-27 NOTE — Assessment & Plan Note (Signed)
Very suspicious that hives stopped when lisinopril was replaced with Benicar. Now back on lisinopril for the last 3 weeks and she has just had her first new lesion. We are going to wait on skin testing-which does not look useful at this time. She is to take her Allegra daily while needed but can try off from time to time. If hives continue, she is to ask her primary physician for a permanent change from lisinopril ACE inhibitor.

## 2014-07-27 NOTE — Assessment & Plan Note (Signed)
She admits only 2 cigarettes daily. That should not be enough to maintain nicotine addiction. I have strongly encouraged her to stop

## 2014-07-27 NOTE — Assessment & Plan Note (Addendum)
This sounds like urticaria although she does not have any lesions to display at this visit. We will assess her degree of allergic sensitization, but I am suspicious that lisinopril/ACE inhibitor may be involved. Plan-lab for Allergy Profiles, change lisinopril to Benicar HCT 40-57for one month trial, try a nonsedating antihistamine Allegra

## 2014-08-13 ENCOUNTER — Telehealth: Payer: Self-pay | Admitting: *Deleted

## 2014-08-13 NOTE — Telephone Encounter (Signed)
Pt called - saw neurosurgeon and ordered MRI. Discuss surgery. Needs refill on pain med - oxycodone 5-325mg . This med is not on current med list. Hilda Blades Lovetta Condie RN 08/13/14 12N

## 2014-08-14 MED ORDER — OXYCODONE-ACETAMINOPHEN 5-325 MG PO TABS: 1.0000 | ORAL_TABLET | Freq: Three times a day (TID) | ORAL | Status: DC | PRN

## 2014-08-14 NOTE — Telephone Encounter (Signed)
Has appt with me 10/1. I filled med. Need for med should be decreasing. Would you pls request (pt can sign release when she comes to get Rx) for neurosurg records?

## 2014-08-14 NOTE — Telephone Encounter (Signed)
Pharmacy has Rx 05/01/14.

## 2014-08-20 ENCOUNTER — Ambulatory Visit: Payer: BC Managed Care – PPO | Admitting: Internal Medicine

## 2014-08-20 ENCOUNTER — Encounter: Payer: Self-pay | Admitting: Internal Medicine

## 2014-08-24 NOTE — Telephone Encounter (Signed)
Talked to pt about signing release papers so [facility] can get info from neurosurgeon. Unable to do MRI due to money up front.

## 2014-09-10 ENCOUNTER — Other Ambulatory Visit: Payer: Self-pay | Admitting: *Deleted

## 2014-09-10 NOTE — Telephone Encounter (Signed)
Call from pt requesting another refill on Oxycodone until her MRI or until the Neurosurgeon decide what they have plan.  Thanks

## 2014-09-10 NOTE — Telephone Encounter (Signed)
Dr Redmond Pulling does not yet know pt so I will address refill. At last refill request, I had requested neurosurg report. I would like to have it prior to refilling. I am here all day so once I get report, I can refill later today.

## 2014-09-11 MED ORDER — OXYCODONE-ACETAMINOPHEN 5-325 MG PO TABS: 1.0000 | ORAL_TABLET | Freq: Two times a day (BID) | ORAL | Status: DC | PRN

## 2014-09-11 NOTE — Telephone Encounter (Signed)
Rx ready - pt called; also informed she needs to obtain MRI and f/u with neurosurg if she needs refill end of Nov per Dr Lynnae January - voiced understanding.

## 2014-09-11 NOTE — Telephone Encounter (Signed)
I am happy to provide one more refill. However, her neurosurg appt was Sep 9th so she has had more than ample time to get the MRI. She will need to have obtained MRI and F/U neurosurg if she needs a refill end of Nov.

## 2014-09-11 NOTE — Telephone Encounter (Signed)
Neurosurg report placed in your box in the clinic. Thanks

## 2014-09-15 ENCOUNTER — Other Ambulatory Visit: Payer: Self-pay | Admitting: Neurosurgery

## 2014-09-15 DIAGNOSIS — M5412 Radiculopathy, cervical region: Secondary | ICD-10-CM

## 2014-09-23 ENCOUNTER — Other Ambulatory Visit: Payer: BC Managed Care – PPO

## 2014-10-06 ENCOUNTER — Other Ambulatory Visit: Payer: BC Managed Care – PPO

## 2014-10-12 ENCOUNTER — Other Ambulatory Visit: Payer: Self-pay | Admitting: *Deleted

## 2014-10-14 NOTE — Telephone Encounter (Signed)
Sent to Dr Lynnae January per Dr Redmond Pulling.

## 2014-10-14 NOTE — Telephone Encounter (Signed)
Talked with pt why med was denied and why MRI was not done before next refill on pain med.

## 2014-10-14 NOTE — Telephone Encounter (Signed)
Per the last refill, "I am happy to provide one more refill. However, her neurosurg appt was Sep 9th so she has had more than ample time to get the MRI. She will need to have obtained MRI and F/U neurosurg if she needs a refill end of Nov. " I need a copy of the MRI and F/U appt if done. If not done, I cannot provide a refill

## 2014-10-14 NOTE — Telephone Encounter (Signed)
The MRI was ordered 10/27. It doesn't take 6 weeks to get MRI completed. The agreement last refill was that she have the MRI completed. I will not refill controlled substances.

## 2014-10-21 ENCOUNTER — Encounter: Payer: Self-pay | Admitting: Internal Medicine

## 2014-10-21 ENCOUNTER — Ambulatory Visit (INDEPENDENT_AMBULATORY_CARE_PROVIDER_SITE_OTHER): Payer: BC Managed Care – PPO | Admitting: Internal Medicine

## 2014-10-21 VITALS — BP 139/92 | HR 86 | Temp 98.1°F | Ht 62.0 in | Wt 216.2 lb

## 2014-10-21 DIAGNOSIS — Z23 Encounter for immunization: Secondary | ICD-10-CM | POA: Diagnosis not present

## 2014-10-21 DIAGNOSIS — I10 Essential (primary) hypertension: Secondary | ICD-10-CM | POA: Diagnosis not present

## 2014-10-21 DIAGNOSIS — M5010 Cervical disc disorder with radiculopathy, unspecified cervical region: Secondary | ICD-10-CM

## 2014-10-21 DIAGNOSIS — J4520 Mild intermittent asthma, uncomplicated: Secondary | ICD-10-CM | POA: Diagnosis not present

## 2014-10-21 DIAGNOSIS — G4733 Obstructive sleep apnea (adult) (pediatric): Secondary | ICD-10-CM

## 2014-10-21 DIAGNOSIS — Z720 Tobacco use: Secondary | ICD-10-CM

## 2014-10-21 DIAGNOSIS — Z0000 Encounter for general adult medical examination without abnormal findings: Secondary | ICD-10-CM

## 2014-10-21 DIAGNOSIS — J069 Acute upper respiratory infection, unspecified: Secondary | ICD-10-CM | POA: Insufficient documentation

## 2014-10-21 MED ORDER — GABAPENTIN 300 MG PO CAPS: 300.0000 mg | ORAL_CAPSULE | Freq: Three times a day (TID) | ORAL | Status: DC

## 2014-10-21 MED ORDER — OXYCODONE-ACETAMINOPHEN 5-325 MG PO TABS: 1.0000 | ORAL_TABLET | Freq: Every day | ORAL | Status: DC | PRN

## 2014-10-21 NOTE — Assessment & Plan Note (Signed)
No dyspnea or increased inhaler use.  She rarely uses her rescue inhaler.

## 2014-10-21 NOTE — Assessment & Plan Note (Signed)
Not interested in CPAP despite me telling her that she stops breathing at night.  She thinks she may no longer need it since the study was 7 years ago.  Will attempt to refer for another sleep study.

## 2014-10-21 NOTE — Assessment & Plan Note (Addendum)
Nasal congestion, rhinorrhea and sinus pressure started 1 week ago and have resolved.  She now still feels some congestion but overall feels symptoms are improving.  She also has some postnasal drip and likely allergy component.  No facial pain, sinus tenderness to suggest sinusitis.  No ear symptoms or exam findings to suggest AOM.  Lungs are clear, she has no dyspnea, no hypoxia and no fever making lower respiratory infection or asthma exac unlikely.  This is likely viral URI since it started 1 week ago and symptoms are improving.  She was advised to return to clinic if symptoms return or worsen in the next few days. - symptomatic treatment - continue allegra, OTC decongestant

## 2014-10-21 NOTE — Progress Notes (Signed)
   Subjective:    Patient ID: Crystal English, female    DOB: 1966/04/12, 48 y.o.   MRN: [id number]  HPI Comments: Crystal English is a 48 year old with PMH of asthma, HTN and cervical disc disease who presents for follow-up.  Please see problem based assessment and plan for update.     Review of Systems  Constitutional: Positive for chills. Negative for fever.  HENT: Positive for congestion and postnasal drip. Negative for ear pain, rhinorrhea, sinus pressure, sneezing and sore throat.   Eyes: Negative for pain, redness and visual disturbance.  Respiratory: Positive for wheezing. Negative for cough and shortness of breath.   Cardiovascular: Negative for chest pain, palpitations and leg swelling.  Gastrointestinal: Negative for nausea, vomiting, diarrhea and constipation.  Genitourinary: Negative for dysuria and hematuria.  Neurological: Negative for syncope, weakness and headaches.       Objective:   Physical Exam  Constitutional: She is oriented to person, place, and time. She appears well-developed. No distress.  HENT:  Head: Normocephalic and atraumatic.  Right Ear: External ear normal.  Left Ear: External ear normal.  Mouth/Throat: Oropharynx is clear and moist. No oropharyngeal exudate.  TMs intact, no mastoid or ear tenderness, no sinus tenderness.  Eyes: EOM are normal. Pupils are equal, round, and reactive to light. Right eye exhibits no discharge. Left eye exhibits no discharge.  Neck: Neck supple.  Cardiovascular: Normal rate, regular rhythm and normal heart sounds.  Exam reveals no gallop and no friction rub.   No murmur heard. Pulmonary/Chest: Breath sounds normal. No respiratory distress. She has no wheezes. She has no rales.  Abdominal: Soft. Bowel sounds are normal. She exhibits no distension. There is no tenderness. There is no rebound.  Musculoskeletal: Normal range of motion. She exhibits no edema or tenderness.  Lymphadenopathy:    She has no cervical  adenopathy.  Neurological: She is alert and oriented to person, place, and time. No cranial nerve deficit.  Skin: Skin is warm. She is not diaphoretic.  Psychiatric: She has a normal mood and affect. Her behavior is normal.  Vitals reviewed.         Assessment & Plan:  Please see problem based assessment and plan.

## 2014-10-21 NOTE — Assessment & Plan Note (Signed)
Flu shot given today

## 2014-10-21 NOTE — Assessment & Plan Note (Addendum)
BP Readings from Last 3 Encounters:  10/21/14 152/94  07/22/14 126/78  07/02/14 155/104    Lab Results  Component Value Date   NA 138 06/23/2014   K 4.3 06/23/2014   CREATININE 0.80 06/23/2014    Assessment: Blood pressure control:  moderately elevated at 152/94, repeat 139/92 Progress toward BP goal:    Comments: took BP meds this morning but she missed it for the previous three days  Plan: Medications:  continue current medications:  Lisinopril 20-25mg  daily Educational resources provided: brochure Self management tools provided:   Other plans: Will hold off on increase since patient has not been compliant.  She plans to start setting an alarm so she can remember to take her BP medications daily.

## 2014-10-21 NOTE — Assessment & Plan Note (Signed)
  Assessment: Progress toward smoking cessation:   smoking 1-2 cigs daily Barriers to progress toward smoking cessation:   she is unsure Comments: she wants to quit  Plan: Instruction/counseling given:  I counseled patient on the dangers of tobacco use, advised patient to stop smoking, and reviewed strategies to maximize success. Educational resources provided:  [employer] account manager) brochure Self management tools provided:    Medications to assist with smoking cessation:  Nicotine Patch Patient agreed to the following self-care plans for smoking cessation: call [employer] (1-800-QUIT-NOW)  Other plans: She plans to purchase the patch or gum.   I advised her not to smoke while using these products.

## 2014-10-21 NOTE — Patient Instructions (Signed)
1. Please only use Percocet as needed.  Take Advil (with food) during the day if needed.  Please follow-up with Dr. Hal Neer after you get the MRI this weekend.  If your cold symptoms return or worsen in the next few days please return to clinic.   2. Please take all medications as prescribed.     3. If you have worsening of your symptoms or new symptoms arise, please call the clinic ([phone]), or go to the ER immediately if symptoms are severe.

## 2014-10-21 NOTE — Assessment & Plan Note (Addendum)
Reports right sided pain in neck, shoulder and fingers and numbness/pain in fingers (especially 3rd and 4th digits).  Pain has been present for about 1.5 years.  Has improved overall but still present, worse at night.  MRI scheduled for 10/25/14 (this Sunday) at [facility].  She says it is more affordable at this site.  Will see Dr. Hal Neer after MRI.  She is out of pain mediation and requesting medication to take, particularly at night.  She is still taking gabapentin.  She is out of Percocet and Tramadol.   She feels Tramadol and gabapentin do not work well for the pain.   - short course of Percocet until she can get MRI and see Dr. Hal Neer; Percocet 5-325 #14 take 1 daily prn, no refills - continue gabapentin - Advil or Tylenol prn (I advised her to keep the Tylenol to a minimum because percocet has 325mg  of Tylenol, however she will only be taking 1 per day). - MRI this weekend and then follow-up with Dr. Hal Neer for further management

## 2014-10-23 NOTE — Progress Notes (Signed)
INTERNAL MEDICINE TEACHING ATTENDING ADDENDUM - Markie Heffernan, MD: I reviewed and discussed at the time of visit with the resident Dr. Wilson, the patient's medical history, physical examination, diagnosis and results of pertinent tests and treatment and I agree with the patient's care as documented.  

## 2014-10-25 ENCOUNTER — Ambulatory Visit
Admission: RE | Admit: 2014-10-25 | Discharge: 2014-10-25 | Disposition: A | Discharge status: Home / Self Care | Payer: BC Managed Care – PPO | Source: Ambulatory Visit | Attending: Neurosurgery | Admitting: Neurosurgery

## 2014-10-25 DIAGNOSIS — M5412 Radiculopathy, cervical region: Secondary | ICD-10-CM

## 2014-12-18 ENCOUNTER — Institutional Professional Consult (permissible substitution): Payer: Self-pay | Admitting: Neurology

## 2014-12-18 ENCOUNTER — Telehealth: Payer: Self-pay | Admitting: Neurology

## 2014-12-18 NOTE — Telephone Encounter (Signed)
Patient is a no show for today's appointment at 8:30(12/18/14)

## 2014-12-22 ENCOUNTER — Telehealth: Payer: Self-pay | Admitting: Internal Medicine

## 2014-12-22 NOTE — Telephone Encounter (Signed)
Call to patient to confirm appointment for 12/23/14 at 1:45. Patient Confirmed

## 2014-12-23 ENCOUNTER — Encounter: Payer: Self-pay | Admitting: Internal Medicine

## 2014-12-23 ENCOUNTER — Ambulatory Visit (INDEPENDENT_AMBULATORY_CARE_PROVIDER_SITE_OTHER): Payer: BLUE CROSS/BLUE SHIELD | Admitting: Internal Medicine

## 2014-12-23 VITALS — BP 133/87 | HR 94 | Temp 98.2°F | Ht 61.0 in | Wt 208.4 lb

## 2014-12-23 DIAGNOSIS — Z124 Encounter for screening for malignant neoplasm of cervix: Secondary | ICD-10-CM

## 2014-12-23 DIAGNOSIS — J4520 Mild intermittent asthma, uncomplicated: Secondary | ICD-10-CM

## 2014-12-23 DIAGNOSIS — I10 Essential (primary) hypertension: Secondary | ICD-10-CM | POA: Diagnosis not present

## 2014-12-23 DIAGNOSIS — F325 Major depressive disorder, single episode, in full remission: Secondary | ICD-10-CM

## 2014-12-23 DIAGNOSIS — Z720 Tobacco use: Secondary | ICD-10-CM | POA: Diagnosis not present

## 2014-12-23 DIAGNOSIS — G4733 Obstructive sleep apnea (adult) (pediatric): Secondary | ICD-10-CM

## 2014-12-23 DIAGNOSIS — N951 Menopausal and female climacteric states: Secondary | ICD-10-CM | POA: Insufficient documentation

## 2014-12-23 DIAGNOSIS — Z0000 Encounter for general adult medical examination without abnormal findings: Secondary | ICD-10-CM

## 2014-12-23 DIAGNOSIS — M5010 Cervical disc disorder with radiculopathy, unspecified cervical region: Secondary | ICD-10-CM | POA: Diagnosis not present

## 2014-12-23 DIAGNOSIS — Z1239 Encounter for other screening for malignant neoplasm of breast: Secondary | ICD-10-CM

## 2014-12-23 MED ORDER — NICOTINE 7 MG/24HR TD PT24: 7.0000 mg | MEDICATED_PATCH | TRANSDERMAL | Status: DC

## 2014-12-23 NOTE — Assessment & Plan Note (Signed)
  Assessment: Progress toward smoking cessation:   1-2 cigs per day (same amount) Barriers to progress toward smoking cessation:   habit Comments: She says it is time to quit.  Plan: Instruction/counseling given:  I counseled patient on the dangers of tobacco use, advised patient to stop smoking, and reviewed strategies to maximize success. Educational resources provided:  [employer] account manager) brochure (denies) Self management tools provided:    Medications to assist with smoking cessation:  Nicotine Patch Patient agreed to the following self-care plans for smoking cessation:    Other plans: Rx provided for Nicoderm but I informed her she can buy OTC if insurance does not cover.  I advised she cannot smoke and use the patch at the same time.  I advised she pick a quit date and stick to it.  She is agreeable.

## 2014-12-23 NOTE — Assessment & Plan Note (Addendum)
PAP/HPV/GC/CT today.  Will refer for mammogram.

## 2014-12-23 NOTE — Assessment & Plan Note (Signed)
No dyspnea or rescue inhaler use.  She has inhaler if she should need it.  We discussed the need for tobacco cessation and she says she is ready to quit.  Rx provided for Nicoderm 7mg .

## 2014-12-23 NOTE — Patient Instructions (Signed)
1. We will call you with PAP results.   I will refer you for mammography and sleep study.     2. Please take all medications as prescribed.  Please buy Nicoderm 7mg  patch at your local pharmacy if your insurance does not cover the prescription.    3. If you have worsening of your symptoms or new symptoms arise, please call the clinic ([phone]), or go to the ER immediately if symptoms are severe.  Please return to clinic for follow-up in  6 months or sooner if needed.

## 2014-12-23 NOTE — Assessment & Plan Note (Signed)
BP Readings from Last 3 Encounters:  12/23/14 133/87  10/21/14 139/92  07/22/14 126/78    Lab Results  Component Value Date   NA 138 06/23/2014   K 4.3 06/23/2014   CREATININE 0.80 06/23/2014    Assessment: Blood pressure control:  at goal  Progress toward BP goal:   controlled Comments: She reports compliance with BP medication.  She is working on diet for weight reduction.    Plan: Medications:  continue current medications:  Lisinopril-HCTZ 20-25 Educational resources provided: brochure (denies need) Self management tools provided:  discussed importance of dietary changes, tobacco cessation and incorporating exercise (i.e. Walking, swimming) for weight reduction and BP control. Other plans: RTC in 6 months for BP check and BMP.

## 2014-12-23 NOTE — Assessment & Plan Note (Signed)
She reports LMP was about five months ago and she has not had any vaginal bleeding since then.  She is having occasional hot flashes.  Given her age she has likely entered or is near menopause. - monitor symptoms; she was advised to inform me if she would like to try a medication for hot flashes

## 2014-12-23 NOTE — Assessment & Plan Note (Addendum)
She says her neck and shoulder pain have resolved but she still experiences some numbness/pain in fingers.  She seems content that the MRI did not reveal anything sever and she says it is not enough of a bother that she needs pain medication.  Dr. Hal Neer prescribed her a medication but she says she did not pick it up because she does not want to have to take too many meds.  She stopped Gabapentin because she didn't feel it helped the pain and she was starting to require it to sleep.  Her sleep is now back to normal and she would like to remain off of Gabapentin. - she is agreeable to OTC NSAID for pain prn; she is currently trying [employer] - will continue to monitor

## 2014-12-23 NOTE — Progress Notes (Signed)
   Subjective:    Patient ID: Crystal English, female    DOB: 1966/09/22, 49 y.o.   MRN: [id number]  HPI Comments: Crystal English is a 49 year old with PMH of asthma, HTN and cervical disc disease who presents for annual exam and PAP smear. Please see problem based assessment and plan for update of chronic conditions.     Review of Systems  Constitutional: Negative for fever, chills, appetite change, fatigue and unexpected weight change.  Eyes: Negative for visual disturbance.  Respiratory: Negative for cough and shortness of breath.   Cardiovascular: Negative for chest pain, palpitations and leg swelling.  Gastrointestinal: Negative for nausea, vomiting, abdominal pain, diarrhea, constipation and blood in stool.  Endocrine: Negative for polydipsia, polyphagia and polyuria.  Genitourinary: Negative for dysuria, frequency, hematuria, vaginal bleeding and vaginal discharge.  Neurological: Negative for syncope, weakness and light-headedness.  Psychiatric/Behavioral: Negative for dysphoric mood.       Objective:   Physical Exam  Constitutional: She is oriented to person, place, and time. She appears well-developed. No distress.  HENT:  Head: Normocephalic and atraumatic.  Mouth/Throat: Oropharynx is clear and moist. No oropharyngeal exudate.  Eyes: EOM are normal. Pupils are equal, round, and reactive to light.  Neck: Neck supple.  Cardiovascular: Normal rate and regular rhythm.  Exam reveals no gallop and no friction rub.   No murmur heard. Pulmonary/Chest: Effort normal and breath sounds normal. No respiratory distress. She has no wheezes. She has no rales.  Abdominal: Soft. Bowel sounds are normal. She exhibits no distension. There is no tenderness. There is no rebound.  Genitourinary: Vagina normal and uterus normal. No breast swelling, tenderness, discharge or bleeding. No labial fusion. There is no rash, tenderness, lesion or injury on the right labia. There is no rash,  tenderness, lesion or injury on the left labia. Cervix exhibits no motion tenderness, no discharge and no friability. Right adnexum displays tenderness. Right adnexum displays no mass and no fullness. Left adnexum displays no mass, no tenderness and no fullness. No erythema, tenderness or bleeding in the vagina. No foreign body around the vagina. No signs of injury around the vagina. No vaginal discharge found.  Mild right pelvic tenderness during manual exam.  No masses palpable.  No CMT.    Musculoskeletal: Normal range of motion. She exhibits no edema or tenderness.  Neurological: She is alert and oriented to person, place, and time. No cranial nerve deficit.  Skin: Skin is warm. She is not diaphoretic.  Psychiatric: She has a normal mood and affect. Her behavior is normal.          Assessment & Plan:  Please see problem based assessment and plan.

## 2014-12-23 NOTE — Progress Notes (Signed)
Observed PAP smear by Dr Redmond Pulling  Case discussed with Dr. Redmond Pulling at the time of the visit.  We reviewed the resident's history and exam and pertinent patient test results.  I agree with the assessment, diagnosis, and plan of care documented in the resident's note.

## 2014-12-23 NOTE — Assessment & Plan Note (Signed)
Weight is stable.  She is working on diet and plans to incorporate light exercise.

## 2014-12-23 NOTE — Assessment & Plan Note (Signed)
Reports stable mood.  Affect and interaction are appropriate during exam.  She is well-groomed, interactive and laughing at appropriate times.  Will continue to monitor.

## 2014-12-23 NOTE — Assessment & Plan Note (Deleted)
1. Mild central canal narrowing at C5-6 without significant foraminal stenosis. 2. Mild to moderate right central and right greater than left foraminal stenosis at C6-7. 3. Shallow central disc protrusion at C4-5 without significant stenosis.

## 2014-12-23 NOTE — Assessment & Plan Note (Signed)
She denies headaches, daytime sleepiness, dyspnea.  I reminded her of the results of her prior sleep study and explained that it is possible she is having interruption in breathing during sleep.  She is agreeable to going for repeat sleep study.

## 2014-12-24 LAB — CERVICOVAGINAL ANCILLARY ONLY
CHLAMYDIA, DNA PROBE: NEGATIVE
Neisseria Gonorrhea: NEGATIVE

## 2014-12-25 LAB — CYTOLOGY - PAP

## 2014-12-31 ENCOUNTER — Encounter: Payer: Self-pay | Admitting: *Deleted

## 2014-12-31 ENCOUNTER — Ambulatory Visit (HOSPITAL_COMMUNITY)
Admission: RE | Admit: 2014-12-31 | Discharge: 2014-12-31 | Disposition: A | Discharge status: Home / Self Care | Payer: BLUE CROSS/BLUE SHIELD | Source: Ambulatory Visit | Attending: Internal Medicine | Admitting: Internal Medicine

## 2014-12-31 DIAGNOSIS — Z1231 Encounter for screening mammogram for malignant neoplasm of breast: Secondary | ICD-10-CM | POA: Insufficient documentation

## 2014-12-31 DIAGNOSIS — Z1239 Encounter for other screening for malignant neoplasm of breast: Secondary | ICD-10-CM

## 2015-05-13 ENCOUNTER — Other Ambulatory Visit: Payer: Self-pay | Admitting: Internal Medicine

## 2015-05-26 ENCOUNTER — Other Ambulatory Visit: Payer: Self-pay | Admitting: *Deleted

## 2015-05-26 DIAGNOSIS — Z720 Tobacco use: Secondary | ICD-10-CM

## 2015-05-26 MED ORDER — NICOTINE 7 MG/24HR TD PT24: 7.0000 mg | MEDICATED_PATCH | TRANSDERMAL | Status: DC

## 2015-05-26 MED ORDER — ALBUTEROL SULFATE HFA 108 (90 BASE) MCG/ACT IN AERS: 2.0000 | INHALATION_SPRAY | Freq: Four times a day (QID) | RESPIRATORY_TRACT | Status: DC | PRN

## 2015-05-26 MED ORDER — LISINOPRIL-HYDROCHLOROTHIAZIDE 20-25 MG PO TABS: 1.0000 | ORAL_TABLET | Freq: Every day | ORAL | Status: DC

## 2015-05-26 NOTE — Telephone Encounter (Signed)
Dr Redmond Pulling, pt's work is transferring her to [employer], she needs a 90 day supply of these meds to give her time to find a new md.

## 2015-06-05 ENCOUNTER — Inpatient Hospital Stay
Admit: 2015-06-05 | Discharge: 2015-06-05 | Disposition: A | Discharge status: Home / Self Care | Payer: Self-pay | Attending: Emergency Medicine

## 2015-06-05 DIAGNOSIS — S29012A Strain of muscle and tendon of back wall of thorax, initial encounter: Secondary | ICD-10-CM

## 2015-06-05 MED ORDER — NAPROXEN 500 MG TAB
500 mg | ORAL_TABLET | Freq: Two times a day (BID) | ORAL | Status: AC
Start: 2015-06-05 — End: 2015-06-15

## 2015-06-05 MED ORDER — METHOCARBAMOL 500 MG TAB
500 mg | ORAL_TABLET | Freq: Four times a day (QID) | ORAL | Status: AC
Start: 2015-06-05 — End: ?

## 2015-06-05 NOTE — ED Notes (Signed)
I have reviewed discharge instructions with the patient.  The patient verbalized understanding.
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
## 2015-06-09 ENCOUNTER — Encounter: Payer: BLUE CROSS/BLUE SHIELD | Admitting: Internal Medicine

## 2015-09-17 ENCOUNTER — Telehealth: Payer: Self-pay | Admitting: *Deleted

## 2015-09-17 NOTE — Telephone Encounter (Signed)
Pt called wanted to know when she had her last flu injection - 10/21/14 - according to immunization info. Pt aware. Hilda Blades Lyfe Reihl RN 09/17/15 10:30AM

## 2015-10-29 ENCOUNTER — Other Ambulatory Visit: Payer: Self-pay

## 2015-10-29 MED ORDER — LISINOPRIL-HYDROCHLOROTHIAZIDE 20-25 MG PO TABS: 1.0000 | ORAL_TABLET | Freq: Every day | ORAL | Status: DC

## 2015-10-29 NOTE — Telephone Encounter (Signed)
Rec'd call regarding refill on this medication, pt thought she had refills and she took last pill this morning.  Pt is out of town and needing refill.  Pharmacy updated.  Sending to PCP and attending pool

## 2016-02-14 ENCOUNTER — Ambulatory Visit (INDEPENDENT_AMBULATORY_CARE_PROVIDER_SITE_OTHER): Payer: Self-pay | Admitting: Internal Medicine

## 2016-02-14 ENCOUNTER — Encounter: Payer: Self-pay | Admitting: Internal Medicine

## 2016-02-14 VITALS — BP 142/92 | HR 76 | Temp 98.0°F | Ht 61.0 in | Wt 221.5 lb

## 2016-02-14 DIAGNOSIS — J4520 Mild intermittent asthma, uncomplicated: Secondary | ICD-10-CM

## 2016-02-14 DIAGNOSIS — F17211 Nicotine dependence, cigarettes, in remission: Secondary | ICD-10-CM

## 2016-02-14 DIAGNOSIS — G4733 Obstructive sleep apnea (adult) (pediatric): Secondary | ICD-10-CM

## 2016-02-14 DIAGNOSIS — Z6841 Body Mass Index (BMI) 40.0 and over, adult: Secondary | ICD-10-CM

## 2016-02-14 DIAGNOSIS — Z720 Tobacco use: Secondary | ICD-10-CM

## 2016-02-14 DIAGNOSIS — E119 Type 2 diabetes mellitus without complications: Secondary | ICD-10-CM | POA: Insufficient documentation

## 2016-02-14 DIAGNOSIS — R7303 Prediabetes: Secondary | ICD-10-CM

## 2016-02-14 DIAGNOSIS — I10 Essential (primary) hypertension: Secondary | ICD-10-CM

## 2016-02-14 DIAGNOSIS — Z79899 Other long term (current) drug therapy: Secondary | ICD-10-CM

## 2016-02-14 LAB — GLUCOSE, CAPILLARY: Glucose-Capillary: 102 mg/dL — ABNORMAL HIGH (ref 65–99)

## 2016-02-14 LAB — POCT GLYCOSYLATED HEMOGLOBIN (HGB A1C): HEMOGLOBIN A1C: 6.3

## 2016-02-14 MED ORDER — LISINOPRIL-HYDROCHLOROTHIAZIDE 20-25 MG PO TABS: 1.0000 | ORAL_TABLET | Freq: Every day | ORAL | Status: DC
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
## 2016-02-14 NOTE — Assessment & Plan Note (Signed)
HPI: No issues, has not used albuterol since her last visit  A: Mild intermittent Asthma  P: Albuterol as needed

## 2016-02-14 NOTE — Assessment & Plan Note (Signed)
HPI: Did not take BP medications this morning.  A: Essential Hypertension, mildly elevated  P: - Ordered refills of Lisinopril HCTZ 20-25mg  daily - Check BMP

## 2016-02-14 NOTE — Assessment & Plan Note (Signed)
HPI: Does not use CPAP  A: OSA  P: Patient currently dose not have insurance and cannot afford the 600 dollars she was told that CPAP cost.  She is interested in weight loss and we will work on that until she can afford CPAP.

## 2016-02-14 NOTE — Assessment & Plan Note (Signed)
A: Prediabetes  P: Repeat A1c, discussed importance of weight loss Referred for MNT.

## 2016-02-14 NOTE — Assessment & Plan Note (Signed)
HPI: Quit smoking  A: Tobacco Abuse in remission  P: Congratulated on smoking cessation
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
## 2016-02-14 NOTE — Patient Instructions (Signed)
General Instructions:   Please bring your medicines with you each time you come to clinic.  Medicines may include prescription medications, over-the-counter medications, herbal remedies, eye drops, vitamins, or other pills.   Progress Toward Treatment Goals:  Treatment Goal 06/25/2014  Blood pressure deteriorated  Stop smoking smoking less  Prevent falls -    Self Care Goals & Plans:  Self Care Goal 12/23/2014  Manage my medications take my medicines as prescribed; bring my medications to every visit; refill my medications on time  Eat healthy foods drink diet soda or water instead of juice or soda; eat more vegetables; eat foods that are low in salt; eat baked foods instead of fried foods; eat fruit for snacks and desserts  Stop smoking -    No flowsheet data found.   Care Management & Community Referrals:  Referral 06/25/2014  Referrals made for care management support none needed  Referrals made to community resources -

## 2016-02-15 LAB — BMP8+ANION GAP
Anion Gap: 20 mmol/L — ABNORMAL HIGH (ref 10.0–18.0)
BUN / CREAT RATIO: 12 (ref 9–23)
BUN: 11 mg/dL (ref 6–24)
CO2: 24 mmol/L (ref 18–29)
CREATININE: 0.9 mg/dL (ref 0.57–1.00)
Calcium: 9.3 mg/dL (ref 8.7–10.2)
Chloride: 100 mmol/L (ref 96–106)
GFR calc Af Amer: 86 mL/min/{1.73_m2} (ref >59)
GFR calc non Af Amer: 74 mL/min/{1.73_m2} (ref >59)
Glucose: 107 mg/dL — ABNORMAL HIGH (ref 65–99)
Potassium: 4.7 mmol/L (ref 3.5–5.2)
SODIUM: 144 mmol/L (ref 134–144)

## 2016-02-16 ENCOUNTER — Telehealth: Payer: Self-pay | Admitting: Internal Medicine

## 2016-02-16 DIAGNOSIS — Z789 Other specified health status: Secondary | ICD-10-CM

## 2016-02-16 NOTE — Telephone Encounter (Signed)
Have called pt back, no answer

## 2016-02-16 NOTE — Progress Notes (Signed)
Internal Medicine Clinic Attending  Case discussed with Dr. Hoffman soon after the resident saw the patient.  We reviewed the resident's history and exam and pertinent patient test results.  I agree with the assessment, diagnosis, and plan of care documented in the resident's note. 

## 2016-02-16 NOTE — Telephone Encounter (Signed)
Pt requesting the nurse to call back. 

## 2016-02-17 NOTE — Telephone Encounter (Signed)
No answer, tried again

## 2016-02-18 NOTE — Telephone Encounter (Signed)
If she can get me the required vaccines today, I will order but am on inpt starting Monday so would then get bounced to [facility] attending.  She needs a Sept appt for routine F/U

## 2016-02-18 NOTE — Telephone Encounter (Signed)
Talked to pt. To come 7th April to get titer drawn. Labs in

## 2016-02-18 NOTE — Telephone Encounter (Signed)
MMR, Hep B, varicella  Do we do MMR in clinic?  

## 2016-02-18 NOTE — Telephone Encounter (Signed)
Spoke with patient, she is starting a new job and is needing vaccination records, specifically varicella titer and she thinks Hep B immunity or the series.  Advised she does not have those vaccines on record.  She questions whether she has to have an appointment to have labs drawn to prove her immunity.  She comes to see Butch Penny next Friday 4/7.  Would you be willing to order labs for a lab only visit?  She will call back to confirm the tests/immunizations needed once she has the paperwork in front of her.

## 2016-02-18 NOTE — Telephone Encounter (Signed)
Called pt to let her know, she will attempt to get them and call me back today

## 2016-02-25 ENCOUNTER — Ambulatory Visit: Payer: Self-pay | Admitting: Dietician

## 2016-04-10 ENCOUNTER — Telehealth: Payer: Self-pay | Admitting: Dietician

## 2016-04-10 ENCOUNTER — Ambulatory Visit: Payer: Self-pay | Admitting: Dietician

## 2016-04-10 NOTE — Telephone Encounter (Signed)
Called for benefits for This patient for prediabetes counseling: her policy on file was terminated march 31.2016

## 2016-04-18 NOTE — Addendum Note (Signed)
Addended by: Hulan Fray on: 04/18/2016 06:59 PM   Modules accepted: Orders

## 2016-05-03 ENCOUNTER — Encounter: Payer: Self-pay | Admitting: *Deleted

## 2016-08-10 ENCOUNTER — Other Ambulatory Visit: Payer: Self-pay | Admitting: Internal Medicine

## 2016-08-10 ENCOUNTER — Ambulatory Visit (INDEPENDENT_AMBULATORY_CARE_PROVIDER_SITE_OTHER): Payer: Self-pay | Admitting: Internal Medicine

## 2016-08-10 ENCOUNTER — Encounter: Payer: Self-pay | Admitting: Internal Medicine

## 2016-08-10 VITALS — BP 135/97 | HR 86 | Temp 98.7°F | Ht 61.0 in | Wt 224.9 lb

## 2016-08-10 DIAGNOSIS — M546 Pain in thoracic spine: Secondary | ICD-10-CM

## 2016-08-10 DIAGNOSIS — M545 Low back pain, unspecified: Secondary | ICD-10-CM | POA: Insufficient documentation

## 2016-08-10 DIAGNOSIS — M5010 Cervical disc disorder with radiculopathy, unspecified cervical region: Secondary | ICD-10-CM

## 2016-08-10 DIAGNOSIS — I10 Essential (primary) hypertension: Secondary | ICD-10-CM

## 2016-08-10 DIAGNOSIS — G43709 Chronic migraine without aura, not intractable, without status migrainosus: Secondary | ICD-10-CM

## 2016-08-10 DIAGNOSIS — G4733 Obstructive sleep apnea (adult) (pediatric): Secondary | ICD-10-CM

## 2016-08-10 DIAGNOSIS — Z87891 Personal history of nicotine dependence: Secondary | ICD-10-CM

## 2016-08-10 DIAGNOSIS — Z79899 Other long term (current) drug therapy: Secondary | ICD-10-CM

## 2016-08-10 DIAGNOSIS — G43909 Migraine, unspecified, not intractable, without status migrainosus: Secondary | ICD-10-CM | POA: Insufficient documentation

## 2016-08-10 HISTORY — DX: Low back pain, unspecified: M54.50

## 2016-08-10 MED ORDER — IBUPROFEN 800 MG PO TABS: 800.0000 mg | ORAL_TABLET | Freq: Three times a day (TID) | ORAL | 0 refills | Status: DC | PRN

## 2016-08-10 MED ORDER — SUMATRIPTAN SUCCINATE 100 MG PO TABS: 100.0000 mg | ORAL_TABLET | Freq: Once | ORAL | 0 refills | Status: DC | PRN

## 2016-08-10 MED ORDER — GUAIFENESIN 200 MG PO TABS: 200.0000 mg | ORAL_TABLET | ORAL | 0 refills | Status: DC | PRN

## 2016-08-10 NOTE — Progress Notes (Signed)
CC: cough, back pain, migraine HPI: [name] is a 50 y.o. female with a h/o of asthma, OSA, HTN who presents with difficulty sleeping 2/2 back pain and cough. She also notes worsening HA and migraines.  Please see Problem-based charting for HPI and the status of patient's chronic medical conditions.  Past Medical History:  Diagnosis Date  . Asthma    Required inhaled corticosteroid in past.   . Depression, major ([place]) 02/25/2013   First episode that required was 2014. Prior episodes that did not require pt to seek medical tx.  . Hypertension    2 drug therapy  . OSA (obstructive sleep apnea)    Sleep study 12/2006 : Moderately Severe OSA. AHI 38.8. O2 sat decreased to 71%.  . Severe obesity (BMI >= 40) (HCC) 10/08/2012   Obesity Class 3. BMI > 40.   . 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 She is helping her daughter care for her grandaughter who is 27 years old. She reports a recent increase in stress related to these activities. She was previously living in [place] but has returned to [place] to help her daughter.  Review of Systems: ROS in HPI. Otherwise: Review of Systems  Constitutional: Negative for chills, fever and weight loss.  Respiratory: Positive for cough. Negative for hemoptysis, sputum production, shortness of breath and wheezing.   Cardiovascular: Negative for chest pain and leg swelling.  Gastrointestinal: Negative for abdominal pain, constipation, diarrhea, nausea and vomiting.  Genitourinary: Negative for dysuria, frequency and urgency.  Neurological: Negative for dizziness, tingling, tremors, sensory change and focal weakness.    Physical Exam: Vitals:   08/10/16 1413  BP: (!) 135/97  Pulse: 86  Temp: 98.7 F (37.1 C)  TempSrc: Oral  SpO2: 100%  Weight: 224 lb 14.4 oz (102 kg)  Height: 5\' 1"  (1.549 m)   Physical Exam  Constitutional: She appears well-developed. She is  cooperative. No distress.  HENT:  Head: Normocephalic and atraumatic.  Nose: Nose normal.  Mouth/Throat: Oropharynx is clear and moist. No oropharyngeal exudate.  Eyes: Pupils are equal, round, and reactive to light.  Neck: Normal range of motion. Neck supple.  Cardiovascular: Normal rate, regular rhythm, normal heart sounds and normal pulses.  Exam reveals no gallop.   No murmur heard. Pulmonary/Chest: Effort normal and breath sounds normal. No respiratory distress. She has no wheezes. She has no rhonchi. She has no rales. Breasts are symmetrical.  Abdominal: Soft. Bowel sounds are normal. There is no tenderness.  Musculoskeletal: She exhibits no edema.  Lymphadenopathy:    She has no cervical adenopathy.    Assessment & Plan:  See encounters tab for problem based medical decision making. Patient seen with Dr. Angelia Mould  Obstructive sleep apnea She is currently complaining of poor sleep and feeling of "choking" and "drowning" at night occasionally. She feels very tired and poorly rested during the day regularly. These symptoms are recently worsened by increased stress, back pain and cough at night.  I feel that her difficulty with sleeping is multifactorial at this point 2/2 untreated OSA, degenerative disc disease causing back pain at night, and viral URI w/ night-time cough. I will recommend symptomatic therapy for the above and work on obtaining resources to correct underlying OSA in the longterm.  Pts sleep study is outdated but shows significant OSA for 2008. She has been unable to obtain a CPAP 2/2 financial limitations. She  is very interested in pursuing this and is hopeful that she will be able to find a job soon which will allow her to carry insurance benefits. I will attempt to refer her to Social Work to help provide options for temporary financial assistance.  Back pain Pt has a h/o back pain and has had cervical disc disease w/ periodic radiculopathy. Currently she notes  midline back pain worse at night and interfering with sleep. She describes the pain as aching. She notes that she is awoken from sleep by this periodically, and it gets worse throughout the day peaking in the evening and at night. She's been taking ibuprofen 800 mg which she obtained from a friend and has been having some relief. She takes this periodically but oftentimes just "puts up with the pain".  I recommend that she attempted "get in front of the pain" by taking scheduled ibuprofen 800 mg every night for the next 1-2 weeks. 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 This therapy may also help her headache symptoms which have been worsened by her lack of sleep recently. And may also provide some benefit to her URI symptoms currently.  Essential hypertension Patient reports good compliance with her lisinopril HCTZ. Her blood pressure has been somewhat elevated lately due to her increase in stress, and currently her headaches. Today her blood pressure is elevated at 135/97. I do not recommend any changes to her pharmacotherapy at this time and would prefer to treat her stress and insomnia.  Her most recent BMP was within normal limits. We will plan to follow-up with her blood pressure in 6 months after we have controlled her stress and other symptoms.  Migraine Patient reports a long-standing history of migraine headaches. She reports currently that she has been getting headaches daily in the mornings, and feels that these are exacerbated by her lack of sleep. She notes that the symptoms are typically helped by taking a nap. But she oftentimes has responsibilities with her granddaughter that precluded this treatment. She notes that the pain in her head is accompanied by aura of flashing lights, nausea, sensitivity to bright light. She also describes occasional headaches that are similar to tension-type that started in the occiput and move around in a  band and feel squeezing in nature.  I feel it is likely that the symptoms are exacerbated by her lack of sleep and current increase in stress. She's had success with Imitrex treatment in the past for her migraines. I will recommend a trial of Imitrex as abortive therapy when necessary for migraines. I feel that she will also be helped by standing ibuprofen 800 mg daily at bedtime which she will take for back pain. Plan to reevaluate these therapies in 2 weeks by phone. I prescribed 15 tabs of Imitrex, and can refill this prescription if needed.   Signed: Holley Raring, MD 08/10/2016, 3:40 PM  Pager: [phone]

## 2016-08-10 NOTE — Assessment & Plan Note (Signed)
Pt has a h/o back pain and has had cervical disc disease w/ periodic radiculopathy. Currently she notes midline back pain worse at night and interfering with sleep. She describes the pain as aching. She notes that she is awoken from sleep by this periodically, and it gets worse throughout the day peaking in the evening and at night. She's been taking ibuprofen 800 mg which she obtained from a friend and has been having some relief. She takes this periodically but oftentimes just "puts up with the pain".  I recommend that she attempted "get in front of the pain" by taking scheduled ibuprofen 800 mg every night for the next 1-2 weeks. 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 This therapy may also help her headache symptoms which have been worsened by her lack of sleep recently. And may also provide some benefit to her URI symptoms currently.

## 2016-08-10 NOTE — Assessment & Plan Note (Addendum)
She is currently complaining of poor sleep and feeling of "choking" and "drowning" at night occasionally. She feels very tired and poorly rested during the day regularly. These symptoms are recently worsened by increased stress, back pain and cough at night.  I feel that her difficulty with sleeping is multifactorial at this point 2/2 untreated OSA, degenerative disc disease causing back pain at night, and viral URI w/ night-time cough. I will recommend symptomatic therapy for the above and work on obtaining resources to correct underlying OSA in the longterm.  Pts sleep study is outdated but shows significant OSA for 2008. She has been unable to obtain a CPAP 2/2 financial limitations. She is very interested in pursuing this and is hopeful that she will be able to find a job soon which will allow her to carry insurance benefits. I will attempt to refer her to Social Work to help provide options for temporary financial assistance.

## 2016-08-10 NOTE — Assessment & Plan Note (Signed)
Patient reports a long-standing history of migraine headaches. She reports currently that she has been getting headaches daily in the mornings, and feels that these are exacerbated by her lack of sleep. She notes that the symptoms are typically helped by taking a nap. But she oftentimes has responsibilities with her granddaughter that precluded this treatment. She notes that the pain in her head is accompanied by aura of flashing lights, nausea, sensitivity to bright light. She also describes occasional headaches that are similar to tension-type that started in the occiput and move around in a band and feel squeezing in nature.  I feel it is likely that the symptoms are exacerbated by her lack of sleep and current increase in stress. She's had success with Imitrex treatment in the past for her migraines. I will recommend a trial of Imitrex as abortive therapy when necessary for migraines. I feel that she will also be helped by standing ibuprofen 800 mg daily at bedtime which she will take for back pain. Plan to reevaluate these therapies in 2 weeks by phone. I prescribed 15 tabs of Imitrex, and can refill this prescription if needed.

## 2016-08-10 NOTE — Patient Instructions (Signed)
Thank you for your visit today. Started here but the increase in stress in her life recently. I feel that most of the symptoms are describing can be attributed in part to this increase in your stress. To help with your back pain by having you take 800 mg ibuprofen every night for the next 2 weeks in order to try to get ahead of the pain. This will also help some with the headaches that you're experiencing in the morning and should help with any symptoms of upper respiratory infection like cold. You can also try taking Mucinex, or guaifenesin, at night before bed in order to reduce the amount of cough symptoms have overnight.  In order to treat the migraines even having in the morning I will give you a prescription for Imitrex. You can take 1 tablet of this when he started to notice migraine symptoms in order to try to control your headache. If this is not effective in 2 hours you can take another dose of this medication for a maximum of 2 pills every 24 hours. I will follow with you by phone in the next 2 weeks in order to see how this is working few. If you're doing better with these treatments will plan to see her back in about 6 months to follow-up on your blood pressure.

## 2016-08-10 NOTE — Assessment & Plan Note (Signed)
Patient reports good compliance with her lisinopril HCTZ. Her blood pressure has been somewhat elevated lately due to her increase in stress, and currently her headaches. Today her blood pressure is elevated at 135/97. I do not recommend any changes to her pharmacotherapy at this time and would prefer to treat her stress and insomnia.  Her most recent BMP was within normal limits. We will plan to follow-up with her blood pressure in 6 months after we have controlled her stress and other symptoms.

## 2016-08-11 NOTE — Progress Notes (Signed)
Internal Medicine Clinic Attending  I saw and evaluated the patient.  I personally confirmed the key portions of the history and exam documented by Dr. Strelow and I reviewed pertinent patient test results.  The assessment, diagnosis, and plan were formulated together and I agree with the documentation in the resident's note. 

## 2016-08-15 ENCOUNTER — Telehealth: Payer: Self-pay | Admitting: Licensed Clinical Social Worker

## 2016-08-15 NOTE — Telephone Encounter (Signed)
Crystal English was referred to [name] as pt is unemployed and uninsured.  CSW will encourage [name] to scheduled with [facility] financial counselor.  CSW placed called to pt.  CSW left message requesting return call. CSW provided contact hours and phone number.

## 2016-08-16 NOTE — Telephone Encounter (Signed)
No response back from [name].  CSW will mail information as to not delay.

## 2016-08-24 ENCOUNTER — Telehealth: Payer: Self-pay | Admitting: Internal Medicine

## 2016-08-24 NOTE — Telephone Encounter (Signed)
  Reason for call:   I placed an outgoing call to [name] at 9:50 AM regarding the status of her back pain and migraines.   Assessment/ Plan:   Pt reports that she has started to go to the gym and exercise regularly. She has noticed a significant improvement in both her back pain and her headache symptoms since returning to regular activity. She has not been requiring the scheduled ibuprofen 800mg  and has not yet had an occasion to use the sumatriptan.  Pt was congratulated on new exercise habit and encouraged to continue. No further interventions necessary at this time. Will plan regularly scheduled f/u for HTN in ~6 months.  As always, pt is advised that if symptoms worsen or new symptoms arise, they should go to an urgent care facility or to to ER for further evaluation.   Holley Raring, MD   08/24/2016, 9:52 AM  ou

## 2016-09-12 ENCOUNTER — Other Ambulatory Visit: Payer: Self-pay

## 2016-09-12 NOTE — Telephone Encounter (Signed)
lisinopril-hydrochlorothiazide (PRINZIDE,ZESTORETIC) 20-25 MG tablet, refill request @ [facility] on [address].

## 2016-09-13 MED ORDER — LISINOPRIL-HYDROCHLOROTHIAZIDE 20-25 MG PO TABS: 1.0000 | ORAL_TABLET | Freq: Every day | ORAL | 1 refills | Status: DC

## 2017-01-24 ENCOUNTER — Ambulatory Visit (INDEPENDENT_AMBULATORY_CARE_PROVIDER_SITE_OTHER): Payer: Self-pay | Admitting: Internal Medicine

## 2017-01-24 VITALS — BP 135/93 | HR 73 | Temp 97.9°F | Ht 61.0 in | Wt 240.0 lb

## 2017-01-24 DIAGNOSIS — I10 Essential (primary) hypertension: Secondary | ICD-10-CM

## 2017-01-24 DIAGNOSIS — R05 Cough: Secondary | ICD-10-CM

## 2017-01-24 DIAGNOSIS — J029 Acute pharyngitis, unspecified: Secondary | ICD-10-CM

## 2017-01-24 DIAGNOSIS — J3489 Other specified disorders of nose and nasal sinuses: Secondary | ICD-10-CM

## 2017-01-24 DIAGNOSIS — Z20818 Contact with and (suspected) exposure to other bacterial communicable diseases: Secondary | ICD-10-CM

## 2017-01-24 DIAGNOSIS — R6883 Chills (without fever): Secondary | ICD-10-CM

## 2017-01-24 DIAGNOSIS — Z79899 Other long term (current) drug therapy: Secondary | ICD-10-CM

## 2017-01-24 DIAGNOSIS — J069 Acute upper respiratory infection, unspecified: Secondary | ICD-10-CM

## 2017-01-24 MED ORDER — CETIRIZINE HCL 10 MG PO CHEW: 10.0000 mg | CHEWABLE_TABLET | Freq: Every day | ORAL | 0 refills | Status: DC

## 2017-01-24 MED ORDER — BENZONATATE 100 MG PO CAPS: 100.0000 mg | ORAL_CAPSULE | Freq: Three times a day (TID) | ORAL | 1 refills | Status: DC | PRN

## 2017-01-24 MED ORDER — FLUTICASONE PROPIONATE 50 MCG/ACT NA SUSP: 2.0000 | Freq: Every day | NASAL | 0 refills | Status: DC

## 2017-01-24 MED ORDER — GUAIFENESIN ER 600 MG PO TB12: 600.0000 mg | ORAL_TABLET | Freq: Two times a day (BID) | ORAL | 0 refills | Status: AC

## 2017-01-24 MED ORDER — ALBUTEROL SULFATE HFA 108 (90 BASE) MCG/ACT IN AERS: 2.0000 | INHALATION_SPRAY | Freq: Four times a day (QID) | RESPIRATORY_TRACT | 0 refills | Status: DC | PRN

## 2017-01-24 NOTE — Progress Notes (Signed)
   CC: cough, sore throat  HPI:  [name] is a 51 y.o. with a PMH listed below presenting to the clinic for evaluation of cough and sore throat.  Patient states that her cough and sore throat began about 3 days ago. Her cough is productive of clear sputum; she also endorses rhinorrhea and some chills. She denies nasal congestion, sinus tenderness, headaches, shortness of breath, chest pain, fevers, nausea, vomiting, abd pain, diarrhea, or myalgias. She states her niece had strep throat 2-3 weeks ago, otherwise she denies sick contacts. She has tried Administrator at home with minimal improvement.  Please see problem based Assessment and Plan for status of patients chronic conditions.  Past Medical History:  Diagnosis Date  . Asthma    Required inhaled corticosteroid in past.   . Depression, major 02/25/2013   First episode that required was 2014. Prior episodes that did not require pt to seek medical tx.  . Hypertension    2 drug therapy  . OSA (obstructive sleep apnea)    Sleep study 12/2006 : Moderately Severe OSA. AHI 38.8. O2 sat decreased to 71%.  . Severe obesity (BMI >= 40) (HCC) 10/08/2012   Obesity Class 3. BMI > 40.   . Tobacco abuse     Review of Systems:   Review of Systems  Constitutional: Positive for chills. Negative for diaphoresis, fever and malaise/fatigue.  HENT: Positive for sore throat. Negative for congestion, hearing loss, sinus pain and tinnitus.   Eyes: Negative for blurred vision, double vision and discharge.  Respiratory: Positive for cough and sputum production (clear). Negative for hemoptysis and shortness of breath.   Cardiovascular: Negative for chest pain and leg swelling.  Gastrointestinal: Negative for abdominal pain, constipation, diarrhea, nausea and vomiting.  Musculoskeletal: Negative for myalgias.  Skin: Negative for rash.  Neurological: Negative for dizziness, sensory change, weakness and headaches.    Physical Exam:  Vitals:     01/24/17 1421 01/24/17 1516  BP: (!) 153/102 (!) 135/93  Pulse: 86 73  Temp: 97.9 F (36.6 C)   TempSrc: Oral   SpO2: 98%   Weight: 240 lb (108.9 kg)   Height: 5\' 1"  (1.549 m)    Physical Exam  Constitutional: She is oriented to person, place, and time. She appears well-developed and well-nourished. No distress.  HENT:  Head: Normocephalic and atraumatic.  Nose: Nose normal.  Mouth/Throat: Oropharynx is clear and moist. No oropharyngeal exudate.  No sinus tenderness  Eyes: Conjunctivae and EOM are normal. Right eye exhibits no discharge. Left eye exhibits no discharge.  Neck: Normal range of motion. Neck supple.  Cardiovascular: Normal rate, regular rhythm, normal heart sounds and intact distal pulses.   Pulmonary/Chest: Effort normal and breath sounds normal. She has no wheezes. She has no rales.  Abdominal: Soft. Bowel sounds are normal. She exhibits no distension. There is no tenderness. There is no guarding.  Musculoskeletal: Normal range of motion. She exhibits no edema.  Lymphadenopathy:    She has no cervical adenopathy.  Neurological: She is alert and oriented to person, place, and time.  Skin: Skin is warm and dry. Capillary refill takes less than 2 seconds. No rash noted. She is not diaphoretic. No erythema.    Assessment & Plan:   See Encounters Tab for problem based charting.   Patient discussed with Dr. Carmel Sacramento, MD Internal Medicine PGY1

## 2017-01-24 NOTE — Patient Instructions (Signed)
For your viral upper respiratory infection use: --flonase nasal spray once daily (steroid) to calm inflammation --zyrtec one pill at night (antihistamine) to dry up mucous and help you sleep --mucinex one pill twice a day as needed to help break up the mucous --tessalon perles three times a day as needed to help suppress your cough I have sent these to your pharmacy.  If you feel like her symptoms are not improving or getting worse within the next 7 days, to please come back to the clinic.  Please make an appointment to see your primary care provider, Dr. Gay Filler as you are due for your 6 month visit.  It was very nice meeting you!

## 2017-01-27 ENCOUNTER — Encounter: Payer: Self-pay | Admitting: Internal Medicine

## 2017-01-27 NOTE — Assessment & Plan Note (Addendum)
Patient states that her cough and sore throat began about 3 days ago. Her cough is productive of clear sputum; she also endorses rhinorrhea and some chills. She denies nasal congestion, sinus tenderness, headaches, shortness of breath, chest pain, fevers, nausea, vomiting, abd pain, diarrhea, or myalgias. She states her niece had strep throat 2-3 weeks ago, otherwise she denies sick contacts. She has tried Administrator at home with minimal improvement.  Symptoms consistent with viral URI with cough 2/2 post-nasal drainage.  Plan: --symptomatic treatment with tessalon perles, zyrtec, mucinex, and flonase --advised inc fluid intake --discussed return precautions

## 2017-01-27 NOTE — Assessment & Plan Note (Signed)
Patient compliant with lisinopril 20mg  daily and HCTZ 25mg  daily. Her BP today on recheck is 135/93. She denies chest pain, shortness of breath, headaches, vision or hearing changes, LE edema.   Patient is acutely ill so will not be making medication changes at this time; she is due for her PCP f/u.  Plan: --continue current regimen --f/u with PCP for 43mo checkup; if BP still elevated would consider changing therapy --advised patient to avoid OTCs with pseudoephedrine

## 2017-01-31 NOTE — Progress Notes (Signed)
Internal Medicine Clinic Attending  Case discussed with Dr. Svalina  at the time of the visit.  We reviewed the resident's history and exam and pertinent patient test results.  I agree with the assessment, diagnosis, and plan of care documented in the resident's note.  

## 2017-02-22 ENCOUNTER — Emergency Department (HOSPITAL_COMMUNITY): Payer: Self-pay

## 2017-02-22 ENCOUNTER — Encounter (HOSPITAL_COMMUNITY): Payer: Self-pay | Admitting: Emergency Medicine

## 2017-02-22 ENCOUNTER — Emergency Department (HOSPITAL_COMMUNITY)
Admission: EM | Admit: 2017-02-22 | Discharge: 2017-02-23 | Disposition: A | Discharge status: Home / Self Care | Payer: Self-pay | Attending: Emergency Medicine | Admitting: Emergency Medicine

## 2017-02-22 DIAGNOSIS — K449 Diaphragmatic hernia without obstruction or gangrene: Secondary | ICD-10-CM | POA: Insufficient documentation

## 2017-02-22 DIAGNOSIS — Z87891 Personal history of nicotine dependence: Secondary | ICD-10-CM | POA: Insufficient documentation

## 2017-02-22 DIAGNOSIS — I10 Essential (primary) hypertension: Secondary | ICD-10-CM | POA: Insufficient documentation

## 2017-02-22 DIAGNOSIS — J45909 Unspecified asthma, uncomplicated: Secondary | ICD-10-CM | POA: Insufficient documentation

## 2017-02-22 DIAGNOSIS — R072 Precordial pain: Secondary | ICD-10-CM | POA: Insufficient documentation

## 2017-02-22 LAB — CBC
HCT: 40.2 % (ref 36.0–46.0)
HEMOGLOBIN: 13.1 g/dL (ref 12.0–15.0)
MCH: 28.7 pg (ref 26.0–34.0)
MCHC: 32.6 g/dL (ref 30.0–36.0)
MCV: 88.2 fL (ref 78.0–100.0)
PLATELETS: 300 10*3/uL (ref 150–400)
RBC: 4.56 MIL/uL (ref 3.87–5.11)
RDW: 14.5 % (ref 11.5–15.5)
WBC: 7.1 10*3/uL (ref 4.0–10.5)

## 2017-02-22 LAB — BASIC METABOLIC PANEL
Anion gap: 7 (ref 5–15)
BUN: 16 mg/dL (ref 6–20)
CHLORIDE: 101 mmol/L (ref 101–111)
CO2: 29 mmol/L (ref 22–32)
CREATININE: 0.91 mg/dL (ref 0.44–1.00)
Calcium: 9.1 mg/dL (ref 8.9–10.3)
GFR calc non Af Amer: >60 mL/min (ref >60)
Glucose, Bld: 224 mg/dL — ABNORMAL HIGH (ref 65–99)
POTASSIUM: 4 mmol/L (ref 3.5–5.1)
Sodium: 137 mmol/L (ref 135–145)

## 2017-02-22 LAB — D-DIMER, QUANTITATIVE: D-Dimer, Quant: 0.57 ug/mL-FEU — ABNORMAL HIGH (ref 0.00–0.50)

## 2017-02-22 LAB — I-STAT TROPONIN, ED: Troponin i, poc: 0 ng/mL (ref 0.00–0.08)

## 2017-02-22 MED ORDER — PANTOPRAZOLE SODIUM 40 MG PO TBEC
40.0000 mg | DELAYED_RELEASE_TABLET | Freq: Once | ORAL | Status: AC
  Administered 2017-02-22: 40 mg via ORAL
  Filled 2017-02-22: qty 1

## 2017-02-22 MED ORDER — IOPAMIDOL (ISOVUE-370) INJECTION 76%
INTRAVENOUS | Status: AC
  Administered 2017-02-22: 60 mL via INTRAVENOUS
  Filled 2017-02-22: qty 100

## 2017-02-22 MED ORDER — PANTOPRAZOLE SODIUM 20 MG PO TBEC: 20.0000 mg | DELAYED_RELEASE_TABLET | Freq: Every day | ORAL | 0 refills | Status: DC

## 2017-02-22 NOTE — ED Notes (Signed)
Patient transported to X-ray 

## 2017-02-22 NOTE — ED Provider Notes (Signed)
[facility] DEPT Provider Note   CSN: [id number] Arrival date & time: 02/22/17  1821     History   Chief Complaint Chief Complaint  Patient presents with  . Chest Pain    HPI Crystal English is a 51 y.o. female.  HPI Patient reports that she has gotten several episodes of chest pain over the past week. She reports that comes on very quickly and gets her several sharp stabbing pains and then resolves. She reports she has not had associated symptoms. He does report off-and-on she's felt a little dizzy but it doesn't come in association with these chest pains. No dyspnea. No radiation into her arms or back. Patient reports that she did have a cough oh couple weeks ago. She reports she saw her doctor who recommended Flonase and OTC meds. She reports that is since resolved. No lower extremity edema or calf tenderness. She reports her legs are always painful from an old injury.  No tobacco use Family history negative for DVT PE. Past Medical History:  Diagnosis Date  . Asthma    Required inhaled corticosteroid in past.   . Depression, major 02/25/2013   First episode that required was 2014. Prior episodes that did not require pt to seek medical tx.  . Hypertension    2 drug therapy  . OSA (obstructive sleep apnea)    Sleep study 12/2006 : Moderately Severe OSA. AHI 38.8. O2 sat decreased to 71%.  . Severe obesity (BMI >= 40) (HCC) 10/08/2012   Obesity Class 3. BMI > 40.   . Tobacco abuse     Patient Active Problem List   Diagnosis Date Noted  . Migraine 08/10/2016  . Back pain 08/10/2016  . Prediabetes 02/14/2016  . Perimenopause 12/23/2014  . Viral URI 10/21/2014  . Chronic urticaria 04/08/2014  . Cervical disc disorder with radiculopathy of cervical region 04/08/2014  . Depression, major 02/25/2013  . Healthcare maintenance 12/16/2012  . Tobacco abuse 10/08/2012  . Severe obesity (BMI >= 40) ([place]) 10/08/2012  . Asthma, chronic 03/09/2010  . Obstructive sleep apnea  01/01/2007  . Essential hypertension 11/23/2006    Past Surgical History:  Procedure Laterality Date  . DILATION AND CURETTAGE OF UTERUS    . ECTOPIC PREGNANCY SURGERY    . LAPAROTOMY     for tubal pregnancy  . TUBAL LIGATION      OB History    No data available       Home Medications    Prior to Admission medications   Medication Sig Start Date End Date Taking? Authorizing Provider  albuterol (PROVENTIL HFA;VENTOLIN HFA) 108 (90 Base) MCG/ACT inhaler Inhale 2 puffs into the lungs every 6 (six) hours as needed for wheezing. 01/24/17  Yes Alphonzo Grieve, MD  cetirizine (ZYRTEC) 10 MG chewable tablet Chew 1 tablet (10 mg total) by mouth daily. 01/24/17  Yes Alphonzo Grieve, MD  guaiFENesin (MUCINEX) 600 MG 12 hr tablet Take 1 tablet (600 mg total) by mouth 2 (two) times daily. 01/24/17 01/24/18 Yes Alphonzo Grieve, MD  ibuprofen (ADVIL,MOTRIN) 800 MG tablet Take 1 tablet (800 mg total) by mouth every 8 (eight) hours as needed. 08/10/16  Yes Holley Raring, MD  lisinopril-hydrochlorothiazide (PRINZIDE,ZESTORETIC) 20-25 MG tablet Take 1 tablet by mouth daily. 09/13/16  Yes Axel Filler, MD  SUMAtriptan (IMITREX) 100 MG tablet Take 1 tablet (100 mg total) by mouth once as needed for migraine. May repeat in 2 hours if headache persists or recurs. 08/10/16 08/11/17 Yes Holley Raring, MD  benzonatate (TESSALON PERLES) 100 MG capsule Take 1 capsule (100 mg total) by mouth 3 (three) times daily as needed for cough. Patient not taking: Reported on 02/22/2017 01/24/17 01/24/18  Alphonzo Grieve, MD  fluticasone (FLONASE) 50 MCG/ACT nasal spray Place 2 sprays into both nostrils daily. Patient not taking: Reported on 02/22/2017 01/24/17 01/24/18  Alphonzo Grieve, MD  pantoprazole (PROTONIX) 20 MG tablet Take 1 tablet (20 mg total) by mouth daily. 02/22/17   Charlesetta Shanks, MD    Family History Family History  Problem Relation Age of Onset  . Stroke Father     in his 74's  . Heart disease Father     died <47  y.o  . Diabetes Mother   . Hypertension Mother   . Arthritis Mother   . Hyperlipidemia Mother   . Diabetes Maternal Aunt   . Cancer Maternal Aunt     breast   . Heart disease Maternal Grandmother   . Asthma Maternal Aunt     Social History Social History  Substance Use Topics  . Smoking status: Former Smoker    Packs/day: 0.03    Years: 30.00    Types: Cigarettes    Quit date: 05/21/2015  . Smokeless tobacco: Never Used     Comment: 3 cigs per day  . Alcohol use 0.0 oz/week     Comment: rarely.     Allergies   Patient has no known allergies.   Review of Systems Review of Systems 10 Systems reviewed and are negative for acute change except as noted in the HPI.  Physical Exam Updated Vital Signs BP 115/83 (BP Location: Left Arm)   Pulse 80   Temp 98.1 F (36.7 C) (Oral)   Resp 17   Ht 5\' 1"  (1.549 m)   Wt 230 lb (104.3 kg)   SpO2 95%   BMI 43.46 kg/m   Physical Exam  Constitutional: She is oriented to person, place, and time. She appears well-developed and well-nourished. No distress.  Patient is alert and nontoxic. No respiratory distress.  HENT:  Head: Normocephalic and atraumatic.  Mouth/Throat: Oropharynx is clear and moist.  Eyes: Conjunctivae and EOM are normal.  Neck: Neck supple.  Cardiovascular: Normal rate, regular rhythm, normal heart sounds and intact distal pulses.   No murmur heard. Pulmonary/Chest: Effort normal and breath sounds normal. No respiratory distress. She exhibits no tenderness.  Abdominal: Soft. She exhibits no distension. There is no tenderness. There is no guarding.  Musculoskeletal: She exhibits no edema or tenderness.  Neurological: She is alert and oriented to person, place, and time. No cranial nerve deficit. Coordination normal.  Skin: Skin is warm and dry.  Psychiatric: She has a normal mood and affect.  Nursing note and vitals reviewed.    ED Treatments / Results  Labs (all labs ordered are listed, but only abnormal  results are displayed) Labs Reviewed  BASIC METABOLIC PANEL - Abnormal; Notable for the following:       Result Value   Glucose, Bld 224 (*)    All other components within normal limits  D-DIMER, QUANTITATIVE (NOT AT [facility]) - Abnormal; Notable for the following:    D-Dimer, Quant 0.57 (*)    All other components within normal limits  CBC  I-STAT TROPOININ, ED    EKG  EKG Interpretation  Date/Time:  Thursday February 22 2017 18:31:10 EDT Ventricular Rate:  94 PR Interval:    QRS Duration: 85 QT Interval:  330 QTC Calculation: 413 R Axis:   60 Text  Interpretation:  Sinus rhythm Low voltage, precordial leads Probable anteroseptal infarct, old Borderline repolarization abnormality no acute ischemic appearance. no change from previous Confirmed by Johnney Killian, MD, Jeannie Done [phone]) on 02/22/2017 6:54:16 PM       Radiology Dg Chest 2 View  Result Date: 02/22/2017 CLINICAL DATA:  Initial encounter. 51 y/o female with c/o intermittent sternal CP x1 week. Some bilateral hand numbness and fatigue as well. Most recent CP was this morning. Describes the pain as very much like a 'stabbing' pain.Hx asthma, bronchitis, HTN - on meds, former smoker (quit 2 years prior) EXAM: CHEST  2 VIEW COMPARISON:  03/11/2013 FINDINGS: The heart size and mediastinal contours are within normal limits. Both lungs are clear. No pleural effusion or pneumothorax. The visualized skeletal structures are unremarkable. IMPRESSION: No active cardiopulmonary disease. Electronically Signed   By: Lajean Manes M.D.   On: 02/22/2017 19:34   Ct Angio Chest Pe W/cm &/or Wo Cm  Result Date: 02/22/2017 CLINICAL DATA:  51 y/o F; intermittent chest pain with elevated D-dimer. Concern for pulmonary embolus. EXAM: CT ANGIOGRAPHY CHEST WITH CONTRAST TECHNIQUE: Multidetector CT imaging of the chest was performed using the standard protocol during bolus administration of intravenous contrast. Multiplanar CT image reconstructions and MIPs were  obtained to evaluate the vascular anatomy. CONTRAST:  60 cc of Omnipaque 370. COMPARISON:  02/22/2017 chest radiograph. FINDINGS: Cardiovascular: Satisfactory opacification of the pulmonary arteries to the segmental level. No evidence of pulmonary embolism. Normal heart size. No pericardial effusion. Mediastinum/Nodes: Prominent lower tracheal, sub- carinal, and bilateral hilar lymph nodes for example a right lower trachea lymph node measures 11 mm short axis (series 5, image 26) and a sub- carinal lymph node measures 12 mm short axis (series 5, image 34). Lungs/Pleura: Lungs are clear. No pleural effusion or pneumothorax. Upper Abdomen: Small hiatal hernia. Musculoskeletal: No chest wall abnormality. No acute or significant osseous findings. Review of the MIP images confirms the above findings. IMPRESSION: 1. Mild respiratory motion artifact. No pulmonary embolus identified. 2. No lung consolidation, effusion, or pneumothorax. 3. Mild mediastinal and hilar lymphadenopathy, possibly reactive or related to granulomatous disease such as sarcoid. 4. Small hiatal hernia. Electronically Signed   By: Kristine Garbe M.D.   On: 02/22/2017 22:33    Procedures Procedures (including critical care time)  Medications Ordered in ED Medications  pantoprazole (PROTONIX) EC tablet 40 mg (not administered)  iopamidol (ISOVUE-370) 76 % injection (60 mLs Intravenous Contrast Given 02/22/17 2205)     Initial Impression / Assessment and Plan / ED Course  I have reviewed the triage vital signs and the nursing notes.  Pertinent labs & imaging results that were available during my care of the patient were reviewed by me and considered in my medical decision making (see chart for details).      Final Clinical Impressions(s) / ED Diagnoses   Final diagnoses:  Precordial pain  Hiatal hernia   Patient had experienced stabbing, pleuritic type chest pain intermittently. The d-dimer was elevated. CT does not show  any PE however patient does have small hiatal hernia. Consideration is given to pain secondary to hiatal hernia with patient otherwise clinically well and URI symptoms resolve from earlier in the week. Patient does not have reproducible chest wall pain. At this time, she'll be started on protonix and counseled on signs and symptoms for which to return. New Prescriptions New Prescriptions   PANTOPRAZOLE (PROTONIX) 20 MG TABLET    Take 1 tablet (20 mg total) by mouth daily.  Charlesetta Shanks, MD 02/22/17 [phone]

## 2017-02-22 NOTE — ED Triage Notes (Signed)
Pt c/o intermittent central chest pain non radiating for past week. Denies SOB, N/V. Pt adds mild tingling in bilateral arms. Pt ambulatory, speaking in full sentences. No pain at present time.

## 2017-03-16 ENCOUNTER — Ambulatory Visit: Payer: Self-pay | Admitting: Internal Medicine

## 2017-03-16 VITALS — BP 138/86 | HR 96 | Temp 98.7°F | Ht 61.0 in | Wt 236.6 lb

## 2017-03-16 DIAGNOSIS — I10 Essential (primary) hypertension: Secondary | ICD-10-CM

## 2017-03-16 NOTE — Progress Notes (Deleted)
   CC: ***  HPI:  Ms.Caitlynne L Pangelinan is a 51 y.o.   Past Medical History:  Diagnosis Date  . Asthma    Required inhaled corticosteroid in past.   . Depression, major 02/25/2013   First episode that required was 2014. Prior episodes that did not require pt to seek medical tx.  . Hypertension    2 drug therapy  . OSA (obstructive sleep apnea)    Sleep study 12/2006 : Moderately Severe OSA. AHI 38.8. O2 sat decreased to 71%.  . Severe obesity (BMI >= 40) (HCC) 10/08/2012   Obesity Class 3. BMI > 40.   . Tobacco abuse     Review of Systems:  ***  Physical Exam:  Vitals:   03/16/17 1551  BP: 138/86  Pulse: 96  Temp: 98.7 F (37.1 C)  TempSrc: Oral  SpO2: 93%  Weight: 236 lb 9.6 oz (107.3 kg)  Height: 5\' 1"  (1.549 m)   ***  Assessment & Plan:   See Encounters Tab for problem based charting.  Patient {GC/GE:3044014::"discussed with","seen with"} Dr. {NAMES:[id number]::"Butcher","Granfortuna","E. Hoffman","Klima","Mullen","Narendra","Vincent"}

## 2017-03-16 NOTE — Progress Notes (Signed)
Vitals taken by nurse. Patient wished to cancel appointment. Not evaluated by physician.

## 2017-03-28 ENCOUNTER — Other Ambulatory Visit: Payer: Self-pay | Admitting: *Deleted

## 2017-03-28 ENCOUNTER — Telehealth: Payer: Self-pay

## 2017-03-28 NOTE — Telephone Encounter (Signed)
Gave appt for Friday and request refill

## 2017-03-28 NOTE — Telephone Encounter (Signed)
Requesting to speak with a nurse about meds.  

## 2017-03-29 MED ORDER — PANTOPRAZOLE SODIUM 20 MG PO TBEC: 20.0000 mg | DELAYED_RELEASE_TABLET | Freq: Every day | ORAL | 0 refills | Status: DC

## 2017-03-30 ENCOUNTER — Ambulatory Visit (INDEPENDENT_AMBULATORY_CARE_PROVIDER_SITE_OTHER): Payer: Self-pay | Admitting: Internal Medicine

## 2017-03-30 VITALS — BP 114/77 | HR 103 | Temp 98.1°F | Wt 233.6 lb

## 2017-03-30 DIAGNOSIS — Z87891 Personal history of nicotine dependence: Secondary | ICD-10-CM

## 2017-03-30 DIAGNOSIS — K449 Diaphragmatic hernia without obstruction or gangrene: Secondary | ICD-10-CM

## 2017-03-30 DIAGNOSIS — R0789 Other chest pain: Secondary | ICD-10-CM

## 2017-03-30 DIAGNOSIS — I10 Essential (primary) hypertension: Secondary | ICD-10-CM

## 2017-03-30 DIAGNOSIS — R7303 Prediabetes: Secondary | ICD-10-CM

## 2017-03-30 DIAGNOSIS — E669 Obesity, unspecified: Secondary | ICD-10-CM

## 2017-03-30 MED ORDER — PANTOPRAZOLE SODIUM 20 MG PO TBEC: 20.0000 mg | DELAYED_RELEASE_TABLET | Freq: Every day | ORAL | 2 refills | Status: DC

## 2017-03-30 NOTE — Progress Notes (Signed)
   CC: ED follow up for chest discomfort  HPI:  Crystal English is a 51 y.o. woman with PMHx as noted below who presents today for ED follow up for chest discomfort.  Patient went to the ED on 4/5 after developing several episodes of chest pain that week. She had reported sharp stabbing pains that quickly resolve. Denied any associated symptoms. She had work up in the ED, including a normal EKG, negative troponin, elevated d-dimer with subsequent CTA chest that revealed no pulmonary embolism but did show a hiatal hernia. Her chest pain was attributed to this and she was discharged with Protonix.  Patient reports she had chest discomfort a few more times in the last week since her ED visit. However, her pain was different than the pain she had in the ED. She describes her more recent pain as dull, located in the middle of her chest, non-radiating, and lasting about 15 seconds before resolving on its own. She does not get the pain daily, but if the pain does occur she will get it about 2-3 times per day. She denies association with exertion. Denies associated SOB, nausea, or diaphoresis. She notes the medication she received in the ED did seem to relieve her pain before she ran out. She denies any abdominal pain, cough, regurgitation of food, or burning sensation in her chest. She did note some nausea this morning. She does not associated the pain with food.  Past Medical History:  Diagnosis Date  . Asthma    Required inhaled corticosteroid in past.   . Depression, major 02/25/2013   First episode that required was 2014. Prior episodes that did not require pt to seek medical tx.  . Hypertension    2 drug therapy  . OSA (obstructive sleep apnea)    Sleep study 12/2006 : Moderately Severe OSA. AHI 38.8. O2 sat decreased to 71%.  . Severe obesity (BMI >= 40) (HCC) 10/08/2012   Obesity Class 3. BMI > 40.   . Tobacco abuse     Review of Systems:   All negative except per HPI  Physical  Exam:  Vitals:   03/30/17 1559  BP: 114/77  Pulse: (!) 103  Temp: 98.1 F (36.7 C)  TempSrc: Oral  SpO2: 95%  Weight: 233 lb 9.6 oz (106 kg)   General: Obese woman in NAD CV: RRR, no m/g/r Pulm: CTA bilaterally, breaths non-labored Abd: BS+, soft, obese, non-tender  Assessment & Plan:   See Encounters Tab for problem based charting.  Patient discussed with Dr. Angelia Mould

## 2017-03-30 NOTE — Patient Instructions (Signed)
General Instructions: - Restart Protonix 40 mg daily - Make sure to take 30 minutes prior to breakfast - Stay upright (sitting or standing) for at least 1 hour after taking pill and eating - Keep up with your weight loss goals! You are doing a great job by getting in the mindset.  - Work on getting more exercise   Please bring your medicines with you each time you come to clinic.  Medicines may include prescription medications, over-the-counter medications, herbal remedies, eye drops, vitamins, or other pills.   Progress Toward Treatment Goals:  Treatment Goal 06/25/2014  Blood pressure deteriorated  Stop smoking smoking less  Prevent falls -    Self Care Goals & Plans:  Self Care Goal 08/10/2016  Manage my medications take my medicines as prescribed; bring my medications to every visit; refill my medications on time  Monitor my health -  Eat healthy foods drink diet soda or water instead of juice or soda; eat more vegetables; eat foods that are low in salt; eat baked foods instead of fried foods; eat fruit for snacks and desserts; eat smaller portions  Be physically active find an activity I enjoy  Stop smoking -  Prevent falls -  Other -  Meeting treatment goals maintain the current self-care plan    No flowsheet data found.   Care Management & Community Referrals:  Referral 06/25/2014  Referrals made for care management support none needed  Referrals made to community resources -

## 2017-04-01 DIAGNOSIS — R0789 Other chest pain: Secondary | ICD-10-CM | POA: Insufficient documentation

## 2017-04-01 NOTE — Assessment & Plan Note (Signed)
Her chest pain seems unlikely to be cardiac in nature given its brevity and non-exertional component. She does have risk factors for cardiac disease, including HTN, smoking, prediabetes, and obesity. Her EKG last month shows normal sinus rhythm without evidence of ischemic changes. Since her symptoms did improve with Protonix and a hiatal hernia was found on CTA chest, her chest discomfort could be related to acid reflux. Will have her restart Protonix 40 mg daily and reassess symptoms. We discussed the proper technique for taking this medication.

## 2017-04-03 NOTE — Progress Notes (Signed)
Internal Medicine Clinic Attending  Case discussed with Dr. Rivet at the time of the visit.  We reviewed the resident's history and exam and pertinent patient test results.  I agree with the assessment, diagnosis, and plan of care documented in the resident's note.  

## 2017-04-27 ENCOUNTER — Encounter: Payer: Self-pay | Admitting: *Deleted

## 2017-04-30 ENCOUNTER — Encounter: Payer: Self-pay | Admitting: Internal Medicine

## 2017-05-02 ENCOUNTER — Telehealth: Payer: Self-pay | Admitting: Internal Medicine

## 2017-05-02 ENCOUNTER — Emergency Department: Admit: 2017-05-02

## 2017-05-02 ENCOUNTER — Inpatient Hospital Stay: Admit: 2017-05-02 | Discharge: 2017-05-02 | Attending: Emergency Medicine

## 2017-05-02 MED ORDER — CYCLOBENZAPRINE 10 MG TAB
10 mg | ORAL | Status: DC
Start: 2017-05-02 — End: 2017-05-02

## 2017-05-02 MED FILL — CYCLOBENZAPRINE 10 MG TAB: 10 mg | ORAL | Qty: 1

## 2017-05-02 NOTE — ED Triage Notes (Signed)
Pt presents with cc of left hip pain X a couple of days. Pt reports difficulty performing ADLs. Pt reports hx of osteoarthritis.

## 2017-05-02 NOTE — Telephone Encounter (Signed)
Pt unable to make appt because she works in [employer]. Patient states her sinuses is acting up/right nostril hurting and painful.

## 2017-05-03 ENCOUNTER — Emergency Department (HOSPITAL_BASED_OUTPATIENT_CLINIC_OR_DEPARTMENT_OTHER)
Admission: EM | Admit: 2017-05-03 | Discharge: 2017-05-03 | Disposition: A | Discharge status: Home / Self Care | Payer: Self-pay | Attending: Emergency Medicine | Admitting: Emergency Medicine

## 2017-05-03 ENCOUNTER — Other Ambulatory Visit: Payer: Self-pay | Admitting: Student in an Organized Health Care Education/Training Program

## 2017-05-03 ENCOUNTER — Encounter (HOSPITAL_BASED_OUTPATIENT_CLINIC_OR_DEPARTMENT_OTHER): Payer: Self-pay | Admitting: *Deleted

## 2017-05-03 DIAGNOSIS — Z79899 Other long term (current) drug therapy: Secondary | ICD-10-CM | POA: Insufficient documentation

## 2017-05-03 DIAGNOSIS — J3489 Other specified disorders of nose and nasal sinuses: Secondary | ICD-10-CM | POA: Insufficient documentation

## 2017-05-03 DIAGNOSIS — Z87891 Personal history of nicotine dependence: Secondary | ICD-10-CM | POA: Insufficient documentation

## 2017-05-03 DIAGNOSIS — J45909 Unspecified asthma, uncomplicated: Secondary | ICD-10-CM | POA: Insufficient documentation

## 2017-05-03 NOTE — ED Provider Notes (Signed)
[facility] Provider Note   CSN: [id number] Arrival date & time: 05/03/17  1712  By signing my name below, I, Mayer Masker, attest that this documentation has been prepared under the direction and in the presence of Kersti Scavone, PA-C. Electronically Signed: Mayer Masker, Scribe. 05/03/17. 6:14 PM.  History   Chief Complaint Chief Complaint  Patient presents with  . Facial Pain   The history is provided by the patient. No language interpreter was used.   HPI Comments: Crystal English is a 51 y.o. female with PMHx of HTN and asthma who presents to the Emergency Department complaining of waxing/waning gradually worsening right-sided nasal pain that began 3 weeks ago. She states the pain only arises when she rubs her nose and feels "like something is in there". She has associated environmental allergies, mild HA, and slight right-sided ear pain. She has taken allergy medications and motrin (800mg ) with mild to no relief. She denies sneezing, rhinorrhea, fever, chills, rashes and visual disturbances. Pt has no h/o sinus infections and denies recent air travel. Pt goes to [facility] for her PCP.  Past Medical History:  Diagnosis Date  . Asthma    Required inhaled corticosteroid in past.   . Depression, major 02/25/2013   First episode that required was 2014. Prior episodes that did not require pt to seek medical tx.  . Hypertension    2 drug therapy  . OSA (obstructive sleep apnea)    Sleep study 12/2006 : Moderately Severe OSA. AHI 38.8. O2 sat decreased to 71%.  . Severe obesity (BMI >= 40) (HCC) 10/08/2012   Obesity Class 3. BMI > 40.   . Tobacco abuse     Patient Active Problem List   Diagnosis Date Noted  . Atypical chest pain 04/01/2017  . Migraine 08/10/2016  . Back pain 08/10/2016  . Prediabetes 02/14/2016  . Perimenopause 12/23/2014  . Viral URI 10/21/2014  . Chronic urticaria 04/08/2014  . Cervical disc disorder with radiculopathy of  cervical region 04/08/2014  . Depression, major 02/25/2013  . Healthcare maintenance 12/16/2012  . Tobacco abuse 10/08/2012  . Severe obesity (BMI >= 40) ([place]) 10/08/2012  . Asthma, chronic 03/09/2010  . Obstructive sleep apnea 01/01/2007  . Essential hypertension 11/23/2006    Past Surgical History:  Procedure Laterality Date  . DILATION AND CURETTAGE OF UTERUS    . ECTOPIC PREGNANCY SURGERY    . LAPAROTOMY     for tubal pregnancy  . TUBAL LIGATION      OB History    No data available       Home Medications    Prior to Admission medications   Medication Sig Start Date End Date Taking? Authorizing Provider  albuterol (PROVENTIL HFA;VENTOLIN HFA) 108 (90 Base) MCG/ACT inhaler Inhale 2 puffs into the lungs every 6 (six) hours as needed for wheezing. 01/24/17   Alphonzo Grieve, MD  benzonatate (TESSALON PERLES) 100 MG capsule Take 1 capsule (100 mg total) by mouth 3 (three) times daily as needed for cough. Patient not taking: Reported on 02/22/2017 01/24/17 01/24/18  Alphonzo Grieve, MD  cetirizine (ZYRTEC) 10 MG chewable tablet Chew 1 tablet (10 mg total) by mouth daily. 01/24/17   Alphonzo Grieve, MD  fluticasone (FLONASE) 50 MCG/ACT nasal spray Place 2 sprays into both nostrils daily. Patient not taking: Reported on 02/22/2017 01/24/17 01/24/18  Alphonzo Grieve, MD  guaiFENesin (MUCINEX) 600 MG 12 hr tablet Take 1 tablet (600 mg total) by mouth 2 (two) times daily. 01/24/17  01/24/18  Alphonzo Grieve, MD  ibuprofen (ADVIL,MOTRIN) 800 MG tablet Take 1 tablet (800 mg total) by mouth every 8 (eight) hours as needed. 08/10/16   Holley Raring, MD  lisinopril-hydrochlorothiazide (PRINZIDE,ZESTORETIC) 20-25 MG tablet Take 1 tablet by mouth daily. 09/13/16   Axel Filler, MD  pantoprazole (PROTONIX) 20 MG tablet Take 1 tablet (20 mg total) by mouth daily. 03/30/17   Rivet, Sindy Guadeloupe, MD  SUMAtriptan (IMITREX) 100 MG tablet Take 1 tablet (100 mg total) by mouth once as needed for migraine. May repeat  in 2 hours if headache persists or recurs. 08/10/16 08/11/17  Holley Raring, MD    Family History Family History  Problem Relation Age of Onset  . Stroke Father        in his 70's  . Heart disease Father        died <47 y.o  . Diabetes Mother   . Hypertension Mother   . Arthritis Mother   . Hyperlipidemia Mother   . Diabetes Maternal Aunt   . Cancer Maternal Aunt        breast   . Heart disease Maternal Grandmother   . Asthma Maternal Aunt     Social History Social History  Substance Use Topics  . Smoking status: Former Smoker    Packs/day: 0.03    Years: 30.00    Types: Cigarettes    Quit date: 05/21/2015  . Smokeless tobacco: Never Used     Comment: 3 cigs per day  . Alcohol use 0.0 oz/week     Comment: rarely.     Allergies   Patient has no known allergies.   Review of Systems Review of Systems  Constitutional: Negative for activity change, chills and fever.  HENT: Positive for ear pain. Negative for rhinorrhea and sneezing.        Positive for: nasal pain  Eyes: Negative for visual disturbance.  Respiratory: Negative for shortness of breath.   Cardiovascular: Negative for chest pain.  Gastrointestinal: Negative for abdominal pain.  Musculoskeletal: Negative for back pain.  Skin: Negative for rash.  Allergic/Immunologic: Positive for environmental allergies.  Neurological: Positive for headaches.   Physical Exam Updated Vital Signs BP 115/78   Pulse 83   Temp 98 F (36.7 C) (Oral)   Resp 16   Ht 5\' 1"  (1.549 m)   Wt 105.7 kg (233 lb)   SpO2 94%   BMI 44.02 kg/m   Physical Exam  Constitutional: No distress.  HENT:  Head: Normocephalic.  TM's normal, some rhinorrhea and congestion in nose Posterior oropharynx is clear Frontal and maxillary sinuses are non TTP Reproducible main with manipulation of the cartilage of the nose No septal hematoma  Eyes: Conjunctivae are normal.  Neck: Neck supple.  Cardiovascular: Normal rate and regular rhythm.   Exam reveals no gallop and no friction rub.   No murmur heard. Pulmonary/Chest: Effort normal. No respiratory distress.  Abdominal: Soft. She exhibits no distension.  Neurological: She is alert.  Skin: Skin is warm. No rash noted.  Psychiatric: Her behavior is normal.  Nursing note and vitals reviewed.    ED Treatments / Results  DIAGNOSTIC STUDIES: Oxygen Saturation is 94% on RA, adequate by my interpretation.    COORDINATION OF CARE: 6:08 PM Discussed treatment plan with pt at bedside and pt agreed to plan. Labs (all labs ordered are listed, but only abnormal results are displayed) Labs Reviewed - No data to display  EKG  EKG Interpretation None  Radiology No results found.  Procedures Procedures (including critical care time)  Medications Ordered in ED Medications - No data to display   Initial Impression / Assessment and Plan / ED Course  I have reviewed the triage vital signs and the nursing notes.  Pertinent labs & imaging results that were available during my care of the patient were reviewed by me and considered in my medical decision making (see chart for details).     Patient with pain to the right side of the nasal bridge that is present only intermittently when rubbing her nose. Unknown etiology, but suspect that the nasal cartilage is irritated from continually rubbing her nose due to her seasonal allergies. No evidence of septal hematoma. No FB identified. No frontal or maxillary sinus tenderness. No visual complaints at this time. VSS. NAD. Will discharge to home with follow-up to primary care if symptoms persist. The pain may be secondary to inflammation of the cartilage and recommended ibuprofen as needed for symptom control. Acknowledged with the patient who is agreeable at this time.  Final Clinical Impressions(s) / ED Diagnoses   Final diagnoses:  Nose irritation    New Prescriptions Discharge Medication List as of 05/03/2017  6:13 PM      I personally performed the services described in this documentation, which was scribed in my presence. The recorded information has been reviewed and is accurate.     Joline Maxcy A, PA-C 05/04/17 Greggory Keen    Veryl Speak, MD 05/04/17 2125

## 2017-05-03 NOTE — ED Triage Notes (Signed)
Pain in the right side of her nose and she is starting to have facial pain. She has been taking OTC allergy medications.

## 2017-05-03 NOTE — Telephone Encounter (Signed)
Called pt, after trying to work out an appt that does not interfere with her work, she will go this pm to [facility] ED

## 2017-05-03 NOTE — Discharge Instructions (Signed)
You can use Flonase, one spray in each nostril, morning and night to help with her allergy symptoms. Flonase as available over-the-counter and will not make you sleepy. You can also try sinus rinses. Please see the attached instructions for this. You can continue to take ibuprofen as directed to help with irritation of the cartilage in the nose. If you develop new or worsening symptoms, including fever, chills, redness, warmth, or swelling to the nose or the face, a new or worsening headache, or changes in her vision, please return to the Emergency Department for reevaluation.

## 2017-05-04 ENCOUNTER — Telehealth: Payer: Self-pay

## 2017-05-04 ENCOUNTER — Ambulatory Visit (INDEPENDENT_AMBULATORY_CARE_PROVIDER_SITE_OTHER): Payer: Self-pay | Admitting: Internal Medicine

## 2017-05-04 VITALS — BP 140/82 | HR 86 | Temp 98.5°F | Ht 61.0 in | Wt 229.8 lb

## 2017-05-04 DIAGNOSIS — J309 Allergic rhinitis, unspecified: Secondary | ICD-10-CM

## 2017-05-04 DIAGNOSIS — Z87891 Personal history of nicotine dependence: Secondary | ICD-10-CM

## 2017-05-04 MED ORDER — CETIRIZINE HCL 10 MG PO CHEW: 10.0000 mg | CHEWABLE_TABLET | Freq: Every day | ORAL | 1 refills | Status: DC | PRN

## 2017-05-04 MED ORDER — FLUTICASONE PROPIONATE 50 MCG/ACT NA SUSP: 2.0000 | Freq: Every day | NASAL | 0 refills | Status: DC

## 2017-05-04 NOTE — Progress Notes (Signed)
   CC: "It feels like there's a bug in my nose.  I hate all bugs. If you find one in there, don't tell me and just put me under!"  HPI:  [name] is a 51 y.o. woman with hypertension, asthma, OSA, obesity, and depression who presents for nasal irritation and pain.   Past Medical History:  Diagnosis Date  . Asthma    Required inhaled corticosteroid in past.   . Depression, major 02/25/2013   First episode that required was 2014. Prior episodes that did not require pt to seek medical tx.  . Hypertension    2 drug therapy  . OSA (obstructive sleep apnea)    Sleep study 12/2006 : Moderately Severe OSA. AHI 38.8. O2 sat decreased to 71%.  . Severe obesity (BMI >= 40) (HCC) 10/08/2012   Obesity Class 3. BMI > 40.   . Tobacco abuse     Review of Systems:   Review of Systems  Constitutional: Negative for chills and fever.  HENT: Negative for congestion, ear discharge, ear pain, hearing loss, nosebleeds, sinus pain and tinnitus.   Eyes: Negative for pain, discharge and redness.  Respiratory: Negative for cough and wheezing.    Physical Exam:  Vitals:   05/04/17 1531  BP: 140/82  Pulse: 86  Temp: 98.5 F (36.9 C)  TempSrc: Oral  SpO2: 99%  Weight: 229 lb 12.8 oz (104.2 kg)  Height: 5\' 1"  (1.549 m)   Physical Exam  Constitutional: She is oriented to person, place, and time. She appears well-developed and well-nourished. No distress.  HENT:  Mouth/Throat: Oropharynx is clear and moist. No oropharyngeal exudate.  Nose normal without discharge Nares normal without foreign body Nasal mucosa normal without edema or erythema Nasal septum without perforation or erosion No tenderness to palpation of nose  Cardiovascular: Normal rate and regular rhythm.   Pulmonary/Chest: Effort normal and breath sounds normal.  Neurological: She is alert and oriented to person, place, and time.  Psychiatric: She has a normal mood and affect. Her behavior is normal.    Assessment &  Plan:   See Encounters Tab for problem based charting.  Patient discussed with Dr. Lynnae January

## 2017-05-04 NOTE — Telephone Encounter (Signed)
Requesting to speak with a nurse. Please call pt back.  

## 2017-05-04 NOTE — Telephone Encounter (Signed)
Returned patient's call she reports that she went to ED because of nose pain and headache reports she still having a headache and her neck hurts in the back. Patient reports she is not having blurred vision, no slurred speech, no facial drooping, is afebrile. Advised patient that PCP would need her to come to further assess her offered appointment she declined states she will go to urgent care tomorrow. Advised patient to return to ED if she needed to reminded her to continue taking OTC for headache.

## 2017-05-04 NOTE — Assessment & Plan Note (Addendum)
For the past 3 weeks she has had nasal irritation, feeling like it is itchy.  Consequently she has been rubbing her nose a lot, and then began to have intense transient pain in her nose lasting for about 15 seconds when she rubbed it.  Most recently she feels like there is a bug crawling around in her right nostril.  She went to the ED yesterday and was sent home with reassurance.  She has not had any more pain in the last 2 days.  She has not used any nasal sprays, Q-tips, snorted any drugs, or placed anything else in her nose.  She thinks she might have allergies.  Occasional sneezing, epistaxis, ocular redness or itching.    A/P Nasal itching and irritation consistent with allergic rhinitis in woman with atopic predisposition.  Pain was probably due to trauma from rubbing. -Flonase PRN -Zyrtec if Flonase doesn't provide allergy relief -Nasal irrigation -RTC PRN

## 2017-05-04 NOTE — Patient Instructions (Signed)
I'm not sure why exactly her nose was hurting, it may have just been irritation from the itching and rubbing of your nose. I'm glad it's better now.  For the itching in your nose, sneezing, and allergy symptoms, I recommend Flonase nasal spray. If you use that every day and still feel like her allergy symptoms are bothering you, then you can take an antihistamine pill like Zyrtec.  You can also try washing out her nose and sinuses with a Neti pot or other sinus irrigation device. He should be available over-the-counter from the pharmacy.   Sinus Rinse What is a sinus rinse? A sinus rinse is a home treatment. It rinses your sinuses with a mixture of salt and water (saline solution). Sinuses are air-filled spaces in your skull behind the bones of your face and forehead. They open into your nasal cavity. To do a sinus rinse, you will need:  Saline solution.  Neti pot or spray bottle. This releases the saline solution into your nose and through your sinuses. You can buy neti pots and spray bottles at: ? [employer]. ? A health food store. ? Online.  When should I do a sinus rinse? A sinus rinse can help to clear your nasal cavity. It can clear:  Mucus.  Dirt.  Dust.  Pollen.  You may do a sinus rinse when you have:  A cold.  A virus.  Allergies.  A sinus infection.  A stuffy nose.  If you are considering a sinus rinse:  Ask your child's doctor before doing a sinus rinse on your child.  Do not do a sinus rinse if you have had: ? Ear or nasal surgery. ? An ear infection. ? Blocked ears.  How do I do a sinus rinse?  Wash your hands.  Disinfect your device using the directions that came with the device.  Dry your device.  Use the solution that comes with your device or one that is sold separately in stores. Follow the mixing directions on the package.  Fill your device with the amount of saline solution as stated in the device instructions.  Stand over a  sink and tilt your head sideways over the sink.  Place the spout of the device in your upper nostril (the one closer to the ceiling).  Gently pour or squeeze the saline solution into the nasal cavity. The liquid should drain to the lower nostril if you are not too congested.  Gently blow your nose. Blowing too hard may cause ear pain.  Repeat in the other nostril.  Clean and rinse your device with clean water.  Air-dry your device. Are there risks of a sinus rinse? Sinus rinse is normally very safe and helpful. However, there are a few risks, which include:  A burning feeling in the sinuses. This may happen if you do not make the saline solution as instructed. Make sure to follow all directions when making the saline solution.  Infection from unclean water. This is rare, but possible.  Nasal irritation.  This information is not intended to replace advice given to you by your health care provider. Make sure you discuss any questions you have with your health care provider. Document Released: 06/03/2014 Document Revised: 10/03/2016 Document Reviewed: 03/24/2014 Elsevier Interactive Patient Education  2017 [employer].

## 2017-05-07 NOTE — Progress Notes (Signed)
Internal Medicine Clinic Attending  Case discussed with Dr. O'Sullivan at the time of the visit.  We reviewed the resident's history and exam and pertinent patient test results.  I agree with the assessment, diagnosis, and plan of care documented in the resident's note. 

## 2017-05-08 ENCOUNTER — Telehealth: Payer: Self-pay

## 2017-05-08 NOTE — Telephone Encounter (Signed)
Requesting to speak with a nurse. Please call pt back.  

## 2017-05-08 NOTE — Telephone Encounter (Signed)
Rtc, vmail was full

## 2017-05-09 ENCOUNTER — Telehealth: Payer: Self-pay | Admitting: Internal Medicine

## 2017-05-09 MED ORDER — SUMATRIPTAN SUCCINATE 100 MG PO TABS: 100.0000 mg | ORAL_TABLET | Freq: Once | ORAL | 3 refills | Status: DC | PRN

## 2017-05-09 NOTE — Telephone Encounter (Signed)
Patient called reports undescribable pain up her neck in her temples no relief from taking OTC c/o feeling nausea, reports pain in neck feels like the start of a migraine but never develops into a migraine. Please advise

## 2017-05-09 NOTE — Telephone Encounter (Signed)
Tried to call to give dr vincents message vmail full

## 2017-05-09 NOTE — Telephone Encounter (Signed)
CALLED THIS MORNING, ASKED NURSE TO F/U WITH HER DOCTOR AND CALL HER BACK, STILL WAITING ON CALL BACK

## 2017-05-09 NOTE — Telephone Encounter (Signed)
The patient does have a history of both MIgraine and Cervical Spine Stenosis listed in the chart. She has been prescribed Imitrex before for migraine treatment. I have refilled the Imitrex with [facility] as listed. She can take one tablet, and then repeat in 2 hours if headache not improved.   We can also offer her an [facility] appointment for today if she would like to be seen.

## 2017-05-14 ENCOUNTER — Ambulatory Visit: Payer: Self-pay

## 2017-05-15 NOTE — Telephone Encounter (Signed)
vmail is full.

## 2017-05-22 ENCOUNTER — Encounter: Payer: Self-pay | Admitting: Internal Medicine

## 2017-05-22 ENCOUNTER — Ambulatory Visit (INDEPENDENT_AMBULATORY_CARE_PROVIDER_SITE_OTHER): Payer: Self-pay | Admitting: Internal Medicine

## 2017-05-22 VITALS — BP 139/92 | HR 92 | Temp 98.6°F | Wt 227.9 lb

## 2017-05-22 DIAGNOSIS — G44209 Tension-type headache, unspecified, not intractable: Secondary | ICD-10-CM

## 2017-05-22 DIAGNOSIS — H0015 Chalazion left lower eyelid: Secondary | ICD-10-CM

## 2017-05-22 MED ORDER — IBUPROFEN 800 MG PO TABS: 800.0000 mg | ORAL_TABLET | Freq: Three times a day (TID) | ORAL | 0 refills | Status: DC | PRN

## 2017-05-22 NOTE — Patient Instructions (Signed)
Crystal English,  It was a pleasure meeting you today. An ophthalmology referral has been made. In the meantime please visit the acute care clinic for any acute medical needs.

## 2017-05-22 NOTE — Assessment & Plan Note (Addendum)
Assessment: Tension headaches Patient states that her headaches have diminished in the past month. When she does have headaches she describes them as diffuse band like pain.  She states that she recently changed her hair style and that her headaches had diminished afterwards.  She states that she takes ibuprofen 800 mg once or twice a day as needed for her headaches.  She denies nausea/vomiting, dizziness, eye pain.  Will refill ibuprofen today.  Plan -ibuprofen

## 2017-05-22 NOTE — Assessment & Plan Note (Addendum)
Assessment: Chalazion of left lower eyelid Patient presented to the clinic thinking she had a stye on both lower eyelids that she noticed in the past month.  She denied pain or inflammation to her eyelids.  During physical exam a painless growth under left eyelid was discovered. Patient was unaware she had a growth under her left eyelid and thus does not know when it first appeared. On exam there was no inflammation, drainage or signs of infection.  It appears to be a chalazion.  Will refer to opthalmology to rule out malignancy.  Picture of eye growth is in the chart.  Plan: opthalmology referral

## 2017-05-22 NOTE — Progress Notes (Signed)
     CC: Follow up on headache  HPI:  [name] is a 51 y.o. with history noted below the presents to the acute care clinic for follow-up on her headaches. Please see assessment and plan for the status of patient's acute medical conditions.  Past Medical History:  Diagnosis Date  . Asthma    Required inhaled corticosteroid in past.   . Depression, major 02/25/2013   First episode that required was 2014. Prior episodes that did not require pt to seek medical tx.  . Hypertension    2 drug therapy  . OSA (obstructive sleep apnea)    Sleep study 12/2006 : Moderately Severe OSA. AHI 38.8. O2 sat decreased to 71%.  . Severe obesity (BMI >= 40) (HCC) 10/08/2012   Obesity Class 3. BMI > 40.   . Tobacco abuse     Review of Systems:  Review of Systems  Constitutional: Negative for malaise/fatigue.  Eyes: Negative for blurred vision and photophobia.  Neurological: Negative for dizziness and focal weakness.     Physical Exam:  Vitals:   05/22/17 1537  BP: (!) 139/92  Pulse: 92  Temp: 98.6 F (37 C)  TempSrc: Oral  SpO2: 98%  Weight: 227 lb 14.4 oz (103.4 kg)   Physical Exam  Constitutional: She is well-developed, well-nourished, and in no distress.  Eyes: Conjunctivae and EOM are normal. Pupils are equal, round, and reactive to light. Right eye exhibits no discharge. Left eye exhibits no discharge. No scleral icterus.  See picture below.  Growth under left eyelid  Cardiovascular: Normal rate, regular rhythm and normal heart sounds.  Exam reveals no gallop and no friction rub.   No murmur heard. Pulmonary/Chest: Effort normal and breath sounds normal. No respiratory distress. She has no wheezes. She has no rales.     Assessment & Plan:   See encounters tab for problem based medical decision making.   Patient seen with Dr. Evette Doffing

## 2017-05-25 NOTE — Progress Notes (Signed)
Internal Medicine Clinic Attending  I saw and evaluated the patient.  I personally confirmed the key portions of the history and exam documented by Dr. Hoffman and I reviewed pertinent patient test results.  The assessment, diagnosis, and plan were formulated together and I agree with the documentation in the resident's note.      

## 2017-05-28 ENCOUNTER — Telehealth: Payer: Self-pay | Admitting: Internal Medicine

## 2017-05-28 NOTE — Telephone Encounter (Signed)
Pt calls and states she went to [facility] after her visit to [facility], she went sat, she stated the NP told her that she had complex sty's and needed abx, she then told pt she would need to pay $120.00 and she would fill the script for abx, pt did not have $120.00 so she had to refuse, there are no notes because she was actually never officially seen Pt states she does not have the ability to come back to clinic now because she just started a new job, wants to know if abx can be sent in.

## 2017-05-28 NOTE — Telephone Encounter (Signed)
Patient was seen at the [facility] over the weekend for her eyes. Pt would like a call back about an antibiotic.

## 2017-05-29 NOTE — Telephone Encounter (Signed)
In my medical opinion there was no sty, there was no sign of infection, there was no indication for antibiotics. Rather the growth was a chalazion which does not need antibiotics. This second hand report of an unofficial visit with an NP that we do not know does not add much to our decision making. If there are new symptoms we can see her again. Otherwise, we did refer her to ophthalmology which would be a great place to have this evaluated.

## 2017-05-30 NOTE — Telephone Encounter (Signed)
Requesting to speak with a nurse about meds. Please call back.  

## 2017-05-30 NOTE — Telephone Encounter (Signed)
Called back patient, she stated " I was calling abt antibiotics for the stye in my eye". I read  your note saying no indication for antibiotics. Patient got upset (was convinced it's an infection). I mentioned the opthalmology referral. She said whe will look for another doctor.

## 2017-06-08 NOTE — Telephone Encounter (Signed)
Open telephone note in error

## 2017-06-14 ENCOUNTER — Ambulatory Visit: Payer: Self-pay

## 2017-07-02 ENCOUNTER — Encounter: Payer: Self-pay | Admitting: *Deleted

## 2017-07-23 ENCOUNTER — Ambulatory Visit (INDEPENDENT_AMBULATORY_CARE_PROVIDER_SITE_OTHER): Payer: Self-pay

## 2017-07-23 ENCOUNTER — Ambulatory Visit (HOSPITAL_COMMUNITY)
Admission: EM | Admit: 2017-07-23 | Discharge: 2017-07-23 | Disposition: A | Discharge status: Home / Self Care | Payer: Self-pay | Attending: Internal Medicine | Admitting: Internal Medicine

## 2017-07-23 ENCOUNTER — Encounter (HOSPITAL_COMMUNITY): Payer: Self-pay | Admitting: Emergency Medicine

## 2017-07-23 DIAGNOSIS — W2209XA Striking against other stationary object, initial encounter: Secondary | ICD-10-CM

## 2017-07-23 DIAGNOSIS — S90121A Contusion of right lesser toe(s) without damage to nail, initial encounter: Secondary | ICD-10-CM

## 2017-07-23 MED ORDER — IBUPROFEN 800 MG PO TABS
ORAL_TABLET | ORAL | Status: AC
  Filled 2017-07-23: qty 1

## 2017-07-23 MED ORDER — IBUPROFEN 800 MG PO TABS: 800.0000 mg | ORAL_TABLET | Freq: Three times a day (TID) | ORAL | 0 refills | Status: DC | PRN

## 2017-07-23 MED ORDER — IBUPROFEN 800 MG PO TABS
800.0000 mg | ORAL_TABLET | Freq: Once | ORAL | Status: AC
  Administered 2017-07-23: 800 mg via ORAL

## 2017-07-23 NOTE — Discharge Instructions (Signed)
Rest elevate the affected extremity and apply ice

## 2017-07-23 NOTE — ED Provider Notes (Signed)
[place]    CSN: [id number] Arrival date & time: 07/23/17  1044     History   Chief Complaint Chief Complaint  Patient presents with  . Foot Pain    HPI Crystal English is a 51 y.o. female.   50 y.o. female presents with injuries to little toe on the right foot X 1 hour prior to arrival to the facility. pateint reports that she stubbed her toe. Patient states that she has had similar occurences and has previously broken the same toe. Condition is acute in nature. Condition is made better by nothing. Condition is made worse by nothing. Patient denies any treatment prior to there arrival at this facility.        Past Medical History:  Diagnosis Date  . Asthma    Required inhaled corticosteroid in past.   . Depression, major 02/25/2013   First episode that required was 2014. Prior episodes that did not require pt to seek medical tx.  . Hypertension    2 drug therapy  . OSA (obstructive sleep apnea)    Sleep study 12/2006 : Moderately Severe OSA. AHI 38.8. O2 sat decreased to 71%.  . Severe obesity (BMI >= 40) (HCC) 10/08/2012   Obesity Class 3. BMI > 40.   . Tobacco abuse     Patient Active Problem List   Diagnosis Date Noted  . Tension headache 05/22/2017  . Chalazion left lower eyelid 05/22/2017  . Allergic rhinitis 05/04/2017  . Atypical chest pain 04/01/2017  . Migraine 08/10/2016  . Back pain 08/10/2016  . Prediabetes 02/14/2016  . Perimenopause 12/23/2014  . Viral URI 10/21/2014  . Chronic urticaria 04/08/2014  . Cervical disc disorder with radiculopathy of cervical region 04/08/2014  . Depression, major 02/25/2013  . Healthcare maintenance 12/16/2012  . Tobacco abuse 10/08/2012  . Severe obesity (BMI >= 40) ([place]) 10/08/2012  . Asthma, chronic 03/09/2010  . Obstructive sleep apnea 01/01/2007  . Essential hypertension 11/23/2006    Past Surgical History:  Procedure Laterality Date  . DILATION AND CURETTAGE OF UTERUS    . ECTOPIC  PREGNANCY SURGERY    . LAPAROTOMY     for tubal pregnancy  . TUBAL LIGATION      OB History    No data available       Home Medications    Prior to Admission medications   Medication Sig Start Date End Date Taking? Authorizing Provider  lisinopril-hydrochlorothiazide (PRINZIDE,ZESTORETIC) 20-25 MG tablet TAKE ONE TABLET BY MOUTH ONCE DAILY 05/05/17  Yes Holley Raring, MD  albuterol (PROVENTIL HFA;VENTOLIN HFA) 108 (90 Base) MCG/ACT inhaler Inhale 2 puffs into the lungs every 6 (six) hours as needed for wheezing. 01/24/17   Alphonzo Grieve, MD  cetirizine (ZYRTEC) 10 MG chewable tablet Chew 1 tablet (10 mg total) by mouth daily as needed for allergies or rhinitis. 05/04/17   Minus Liberty, MD  fluticasone (FLONASE) 50 MCG/ACT nasal spray Place 2 sprays into both nostrils daily. 05/04/17 05/04/18  Minus Liberty, MD  guaiFENesin (MUCINEX) 600 MG 12 hr tablet Take 1 tablet (600 mg total) by mouth 2 (two) times daily. 01/24/17 01/24/18  Alphonzo Grieve, MD  ibuprofen (ADVIL,MOTRIN) 800 MG tablet Take 1 tablet (800 mg total) by mouth every 8 (eight) hours as needed. 05/22/17   Kalman Shan Ratliff, DO  pantoprazole (PROTONIX) 20 MG tablet Take 1 tablet (20 mg total) by mouth daily. 03/30/17   Rivet, Sindy Guadeloupe, MD  SUMAtriptan (IMITREX) 100 MG tablet Take 1 tablet (100 mg  total) by mouth once as needed for migraine. May repeat in 2 hours if headache persists or recurs. 05/09/17   Axel Filler, MD    Family History Family History  Problem Relation Age of Onset  . Stroke Father        in his 67's  . Heart disease Father        died <47 y.o  . Diabetes Mother   . Hypertension Mother   . Arthritis Mother   . Hyperlipidemia Mother   . Diabetes Maternal Aunt   . Cancer Maternal Aunt        breast   . Heart disease Maternal Grandmother   . Asthma Maternal Aunt     Social History Social History  Substance Use Topics  . Smoking status: Former Smoker    Packs/day: 0.03     Years: 30.00    Types: Cigarettes    Quit date: 05/21/2015  . Smokeless tobacco: Never Used     Comment: 3 cigs per day  . Alcohol use 0.0 oz/week     Comment: rarely.     Allergies   Patient has no known allergies.   Review of Systems Review of Systems  Constitutional: Negative for chills and fever.  HENT: Negative for ear pain and sore throat.   Eyes: Negative for pain and visual disturbance.  Respiratory: Negative for cough and shortness of breath.   Cardiovascular: Negative for chest pain and palpitations.  Gastrointestinal: Negative for abdominal pain and vomiting.  Genitourinary: Negative for dysuria and hematuria.  Musculoskeletal: Negative for arthralgias and back pain.        Pain to little toe on right foot.  Skin: Negative for color change and rash.  Neurological: Negative for seizures and syncope.  All other systems reviewed and are negative.    Physical Exam Triage Vital Signs ED Triage Vitals  Enc Vitals Group     BP 07/23/17 1215 125/86     Pulse Rate 07/23/17 1215 94     Resp 07/23/17 1215 18     Temp 07/23/17 1215 98.4 F (36.9 C)     Temp Source 07/23/17 1215 Oral     SpO2 07/23/17 1215 99 %     Weight --      Height --      Head Circumference --      Peak Flow --      Pain Score 07/23/17 1212 5     Pain Loc --      Pain Edu? --      Excl. in [place]? --    No data found.   Updated Vital Signs BP 125/86 (BP Location: Right Arm) Comment: large cuff  Pulse 94   Temp 98.4 F (36.9 C) (Oral)   Resp 18   SpO2 99%   Visual Acuity Right Eye Distance:   Left Eye Distance:   Bilateral Distance:    Right Eye Near:   Left Eye Near:    Bilateral Near:     Physical Exam  Constitutional: She is oriented to person, place, and time. She appears well-developed and well-nourished.  HENT:  Head: Normocephalic and atraumatic.  Eyes: Conjunctivae are normal.  Neck: Normal range of motion.  Pulmonary/Chest: Effort normal.  Neurological: She is alert  and oriented to person, place, and time.  Skin: Skin is warm and dry.   Hematoma noted to lateral aspect of little toe on right foot.   Psychiatric: She has a normal mood and affect.  Nursing  note and vitals reviewed.    UC Treatments / Results  Labs (all labs ordered are listed, but only abnormal results are displayed) Labs Reviewed - No data to display  EKG  EKG Interpretation None       Radiology No results found.  Procedures Procedures (including critical care time)  Medications Ordered in UC Medications - No data to display   Initial Impression / Assessment and Plan / UC Course  I have reviewed the triage vital signs and the nursing notes.  Pertinent labs & imaging results that were available during my care of the patient were reviewed by me and considered in my medical decision making (see chart for details).       Final Clinical Impressions(s) / UC Diagnoses   Final diagnoses:  None    New Prescriptions New Prescriptions   No medications on file     Controlled Substance Prescriptions [place] Controlled Substance Registry consulted? Not Applicable   Jacqualine Mau, NP 07/23/17 1336

## 2017-07-23 NOTE — ED Triage Notes (Signed)
Caught right little toe on something and toe .  Pain specific to toe

## 2017-08-01 ENCOUNTER — Ambulatory Visit (INDEPENDENT_AMBULATORY_CARE_PROVIDER_SITE_OTHER): Payer: Self-pay | Admitting: Internal Medicine

## 2017-08-01 ENCOUNTER — Encounter: Payer: Self-pay | Admitting: Internal Medicine

## 2017-08-01 VITALS — BP 140/93 | HR 74 | Temp 97.7°F | Ht 61.0 in | Wt 230.4 lb

## 2017-08-01 DIAGNOSIS — R29898 Other symptoms and signs involving the musculoskeletal system: Secondary | ICD-10-CM | POA: Insufficient documentation

## 2017-08-01 NOTE — Progress Notes (Signed)
   CC: spider bite on back  HPI:  [name] is a 51 y.o. female with past medical history as documented below presenting with a concern that she was bit by a spider one month ago and has caused her to have a lump on her back. She states that she will have intermittent "catching" of her back when she reaches for something, or when she is using her back muscles to get out of bed. She states that the "catching" is not a pain, but is an awareness of that area of her back, which is on the right side beneath her bra strap. The sensation is non-radiating. She thinks it may be muscular, but wanted to confirm that she did not have an abscess from the spider bite. She denies increased activity or injury to her back recently, no trauma besides the spider bite one month ago. She has been unable to see the area so is not sure what it looks like or if it has worsened over time. She denies fever or chills.   Past Medical History:  Diagnosis Date  . Asthma    Required inhaled corticosteroid in past.   . Depression, major 02/25/2013   First episode that required was 2014. Prior episodes that did not require pt to seek medical tx.  . Hypertension    2 drug therapy  . OSA (obstructive sleep apnea)    Sleep study 12/2006 : Moderately Severe OSA. AHI 38.8. O2 sat decreased to 71%.  . Severe obesity (BMI >= 40) (HCC) 10/08/2012   Obesity Class 3. BMI > 40.   . Tobacco abuse    Review of Systems:   Review of Systems  Constitutional: Negative.  Negative for chills and fever.  Respiratory: Negative.  Negative for shortness of breath.   Cardiovascular: Negative.  Negative for chest pain.  Gastrointestinal: Negative.  Negative for diarrhea, nausea and vomiting.  Musculoskeletal: Negative.  Negative for myalgias.  Skin: Negative.  Negative for itching and rash.    Physical Exam:  Vitals:   08/01/17 1610  BP: (!) 140/93  Pulse: 74  Temp: 97.7 F (36.5 C)  TempSrc: Oral  SpO2: 99%  Weight: 230  lb 6.4 oz (104.5 kg)  Height: 5\' 1"  (1.549 m)   Physical Exam  Constitutional: She appears well-developed and well-nourished.  HENT:  Head: Normocephalic and atraumatic.  Eyes: Conjunctivae and EOM are normal.  Neck: Normal range of motion. Neck supple.  Cardiovascular: Normal rate and regular rhythm.   Pulmonary/Chest: Effort normal and breath sounds normal.  Musculoskeletal:       Thoracic back: She exhibits swelling. She exhibits normal range of motion, no tenderness, no edema and no deformity.       Back:  Skin: Skin is warm and dry.    Assessment & Plan:   See Encounters Tab for problem based charting.  Patient seen with Dr. Evette Doffing

## 2017-08-01 NOTE — Patient Instructions (Signed)
Crystal English,   It was a pleasure meeting you today!  You have no signs of infection on your back.   If your back starts to bother you more please return to clinic to be re-evaluated.

## 2017-08-01 NOTE — Assessment & Plan Note (Signed)
PE of thoracic back revealed no abscess or eyrthema, skin was intact and dry. There was mild swelling of the soft tissue on the right compared to the left, but no distinct mass or nodule noted. No tenderness to palpation. The patient's back complaints are most likely due to muscular strain. No concern for cellulitis or abscess.   Plan: -Ibuprofen 800 mg as needed for discomfort  -RTC if the area becomes more swollen or causes more discomfort

## 2017-08-02 NOTE — Addendum Note (Signed)
Addended by: Hulan Fray on: 08/02/2017 01:44 PM   Modules accepted: Orders

## 2017-08-02 NOTE — Progress Notes (Signed)
Internal Medicine Clinic Attending  I saw and evaluated the patient.  I personally confirmed the key portions of the history and exam documented by Dr. LaCroce and I reviewed pertinent patient test results.  The assessment, diagnosis, and plan were formulated together and I agree with the documentation in the resident's note.  

## 2017-08-06 ENCOUNTER — Telehealth: Payer: Self-pay

## 2017-08-06 NOTE — Telephone Encounter (Signed)
Needs to speak with a nurse about pain. Please call back.

## 2017-08-07 NOTE — Telephone Encounter (Signed)
Pt is still waiting for a nurse to call back. Please call pt back.

## 2017-08-07 NOTE — Telephone Encounter (Signed)
Called pt, appt 9/20 at 1545

## 2017-08-09 ENCOUNTER — Encounter: Payer: Self-pay | Admitting: Internal Medicine

## 2017-08-09 ENCOUNTER — Ambulatory Visit: Payer: Self-pay

## 2017-08-16 ENCOUNTER — Ambulatory Visit (INDEPENDENT_AMBULATORY_CARE_PROVIDER_SITE_OTHER): Payer: Self-pay | Admitting: *Deleted

## 2017-08-16 ENCOUNTER — Ambulatory Visit: Payer: Self-pay

## 2017-08-16 DIAGNOSIS — Z23 Encounter for immunization: Secondary | ICD-10-CM

## 2017-09-19 ENCOUNTER — Other Ambulatory Visit: Payer: Self-pay | Admitting: Student in an Organized Health Care Education/Training Program

## 2017-11-06 ENCOUNTER — Other Ambulatory Visit: Payer: Self-pay | Admitting: Internal Medicine

## 2017-11-06 NOTE — Telephone Encounter (Signed)
Patient is requesting ibuprofen 800mg  tablets

## 2017-11-07 MED ORDER — IBUPROFEN 800 MG PO TABS: 800.0000 mg | ORAL_TABLET | Freq: Three times a day (TID) | ORAL | 0 refills | Status: DC | PRN

## 2018-01-14 ENCOUNTER — Other Ambulatory Visit: Payer: Self-pay | Admitting: Internal Medicine

## 2018-01-14 NOTE — Telephone Encounter (Signed)
Refill Request   lisinopril-hydrochlorothiazide (PRINZIDE,ZESTORETIC) 20-25 MG tablet    lisinopril-hydrochlorothiazide (PRINZIDE,ZESTORETIC) 20-25 MG tablet

## 2018-01-15 NOTE — Telephone Encounter (Signed)
Patient concerned about her BP Medications because she has been 2 days without it.  PT has contacted her pharmacy and would like a call back if possible.

## 2018-01-16 ENCOUNTER — Encounter: Payer: Self-pay | Admitting: Internal Medicine

## 2018-01-16 MED ORDER — LISINOPRIL-HYDROCHLOROTHIAZIDE 20-25 MG PO TABS: 1.0000 | ORAL_TABLET | Freq: Every day | ORAL | 0 refills | Status: DC

## 2018-01-16 NOTE — Telephone Encounter (Signed)
lisinopril-hydrochlorothiazide (PRINZIDE,ZESTORETIC) 20-25 MG tablet, refill request @ [facility] on [address]. Pt states she is completely out. Please call back.

## 2018-01-17 ENCOUNTER — Ambulatory Visit (INDEPENDENT_AMBULATORY_CARE_PROVIDER_SITE_OTHER): Payer: PRIVATE HEALTH INSURANCE | Admitting: Internal Medicine

## 2018-01-17 ENCOUNTER — Telehealth: Payer: Self-pay | Admitting: Internal Medicine

## 2018-01-17 ENCOUNTER — Encounter: Payer: Self-pay | Admitting: Internal Medicine

## 2018-01-17 VITALS — BP 131/87 | HR 84 | Temp 98.0°F | Ht 61.0 in | Wt 236.3 lb

## 2018-01-17 DIAGNOSIS — R7303 Prediabetes: Secondary | ICD-10-CM | POA: Diagnosis not present

## 2018-01-17 DIAGNOSIS — E119 Type 2 diabetes mellitus without complications: Secondary | ICD-10-CM

## 2018-01-17 DIAGNOSIS — I10 Essential (primary) hypertension: Secondary | ICD-10-CM | POA: Diagnosis not present

## 2018-01-17 DIAGNOSIS — Z0000 Encounter for general adult medical examination without abnormal findings: Secondary | ICD-10-CM

## 2018-01-17 DIAGNOSIS — Z6841 Body Mass Index (BMI) 40.0 and over, adult: Secondary | ICD-10-CM

## 2018-01-17 DIAGNOSIS — Z79899 Other long term (current) drug therapy: Secondary | ICD-10-CM

## 2018-01-17 LAB — GLUCOSE, CAPILLARY: GLUCOSE-CAPILLARY: 122 mg/dL — AB (ref 65–99)

## 2018-01-17 LAB — POCT GLYCOSYLATED HEMOGLOBIN (HGB A1C): HEMOGLOBIN A1C: 7.5

## 2018-01-17 MED ORDER — IBUPROFEN 800 MG PO TABS: 800.0000 mg | ORAL_TABLET | Freq: Three times a day (TID) | ORAL | 0 refills | Status: DC | PRN

## 2018-01-17 NOTE — Telephone Encounter (Signed)
PT NEEDS REFILL ON B/P [place]

## 2018-01-17 NOTE — Progress Notes (Signed)
   CC: Follow-up for her HTN and Obesity related comorbidites   HPI:  Crystal English is a 52 y.o. with  HTN and Obesity related comorbidites who presented to the clinic for continued evaluation and management of her chronic medical conditions. Please refer to problem based charting for a detailed evaluation and management plan.   Past Medical History:  Diagnosis Date  . Asthma    Required inhaled corticosteroid in past.   . Depression, major 02/25/2013   First episode that required was 2014. Prior episodes that did not require pt to seek medical tx.  . Hypertension    2 drug therapy  . OSA (obstructive sleep apnea)    Sleep study 12/2006 : Moderately Severe OSA. AHI 38.8. O2 sat decreased to 71%.  . Severe obesity (BMI >= 40) (HCC) 10/08/2012   Obesity Class 3. BMI > 40.   . Tobacco abuse    Review of Systems:   12 point ROS preformed. All negative aside from those mentioned in the HPI.  Physical Exam: Vitals:   01/17/18 1516  BP: 131/87  Pulse: 84  Temp: 98 F (36.7 C)  TempSrc: Oral  SpO2: 98%  Weight: 236 lb 4.8 oz (107.2 kg)  Height: 5\' 1"  (1.549 m)   General: Well nourished female in no acute distress HENT: Normocephalic, atraumatic, moist mucus membranes Pulm: Good air movement with no wheezing or crackles  CV: RRR, no murmurs, no rubs  Abdomen: Active bowel sounds, soft, non-distended, no tenderness to palpation  Extremities: Pulses palpable in all extremities, no LE edema  Skin: Warm and dry  Neuro: Alert and oriented x 3  Assessment & Plan:   See Encounters Tab for problem based charting.  Patient discussed with Dr. Dareen Piano

## 2018-01-17 NOTE — Telephone Encounter (Signed)
Called [facility] [address] to transfer rx. Pharmacy states will call [place] should be available 20-30 mins. Patient informed

## 2018-01-17 NOTE — Assessment & Plan Note (Signed)
Patients A1c 7.5 today. She states that she recently joined weight loss program through [facility] as outlined her obesity problem. She is reluctant to start medication today. Discussed that full diabetes can lead to retinopathy, nephropathy, neuropathy, and CAD. She expresses understanding. She will follow-up in three months or A1c recheck and if her A1c is not below seven we will start Metformin at that time. We discussed how we would initiate metformin and the possible side effects if it comes to that. She expresses understanding.  Plan: - Follow-up in 3 months. If A1c >7.0 will start Metformin 500 mg QD with plans to increase  - Encouraged continued diet and education

## 2018-01-17 NOTE — Assessment & Plan Note (Signed)
The patient works at [employer] and has recently entered into a [employer] program. Through this she gets access to a dietitian and gets recommendations on how to modify her diet and exercise. She states that she started the program three weeks ago and since that time has lost 5 to 7 pounds. She states that she feels really good with this program and plans to continue for the next year. We discussed that weight loss will be most beneficial thing for her health. As she loses weight we may be able to come back off her blood pressure medications and may be able to avoid starting diabetic medication. She will follow-up in three months.

## 2018-01-17 NOTE — Patient Instructions (Signed)
It was a pleasure to meet you today. Keep up the great work with dieting and exercise. Weight loss is going to be the best thing for your health. I will see you back in 3 months and we will recheck you A1c.   Diabetes Mellitus and Nutrition When you have diabetes (diabetes mellitus), it is very important to have healthy eating habits because your blood sugar (glucose) levels are greatly affected by what you eat and drink. Eating healthy foods in the appropriate amounts, at about the same times every day, can help you:  Control your blood glucose.  Lower your risk of heart disease.  Improve your blood pressure.  Reach or maintain a healthy weight.  Every person with diabetes is different, and each person has different needs for a meal plan. Your health care provider may recommend that you work with a diet and nutrition specialist (dietitian) to make a meal plan that is best for you. Your meal plan may vary depending on factors such as:  The calories you need.  The medicines you take.  Your weight.  Your blood glucose, blood pressure, and cholesterol levels.  Your activity level.  Other health conditions you have, such as heart or kidney disease.  How do carbohydrates affect me? Carbohydrates affect your blood glucose level more than any other type of food. Eating carbohydrates naturally increases the amount of glucose in your blood. Carbohydrate counting is a method for keeping track of how many carbohydrates you eat. Counting carbohydrates is important to keep your blood glucose at a healthy level, especially if you use insulin or take certain oral diabetes medicines. It is important to know how many carbohydrates you can safely have in each meal. This is different for every person. Your dietitian can help you calculate how many carbohydrates you should have at each meal and for snack. Foods that contain carbohydrates include:  Bread, cereal, rice, pasta, and crackers.  Potatoes and  corn.  Peas, beans, and lentils.  Milk and yogurt.  Fruit and juice.  Desserts, such as cakes, cookies, ice cream, and candy.  How does alcohol affect me? Alcohol can cause a sudden decrease in blood glucose (hypoglycemia), especially if you use insulin or take certain oral diabetes medicines. Hypoglycemia can be a life-threatening condition. Symptoms of hypoglycemia (sleepiness, dizziness, and confusion) are similar to symptoms of having too much alcohol. If your health care provider says that alcohol is safe for you, follow these guidelines:  Limit alcohol intake to no more than 1 drink per day for nonpregnant women and 2 drinks per day for men. One drink equals 12 oz of beer, 5 oz of wine, or 1 oz of hard liquor.  Do not drink on an empty stomach.  Keep yourself hydrated with water, diet soda, or unsweetened iced tea.  Keep in mind that regular soda, juice, and other mixers may contain a lot of sugar and must be counted as carbohydrates.  What are tips for following this plan? Reading food labels  Start by checking the serving size on the label. The amount of calories, carbohydrates, fats, and other nutrients listed on the label are based on one serving of the food. Many foods contain more than one serving per package.  Check the total grams (g) of carbohydrates in one serving. You can calculate the number of servings of carbohydrates in one serving by dividing the total carbohydrates by 15. For example, if a food has 30 g of total carbohydrates, it would be equal  to 2 servings of carbohydrates.  Check the number of grams (g) of saturated and trans fats in one serving. Choose foods that have low or no amount of these fats.  Check the number of milligrams (mg) of sodium in one serving. Most people should limit total sodium intake to less than 2,300 mg per day.  Always check the nutrition information of foods labeled as "low-fat" or "nonfat". These foods may be higher in added  sugar or refined carbohydrates and should be avoided.  Talk to your dietitian to identify your daily goals for nutrients listed on the label. Shopping  Avoid buying canned, premade, or processed foods. These foods tend to be high in fat, sodium, and added sugar.  Shop around the outside edge of the grocery store. This includes fresh fruits and vegetables, bulk grains, fresh meats, and fresh dairy. Cooking  Use low-heat cooking methods, such as baking, instead of high-heat cooking methods like deep frying.  Cook using healthy oils, such as olive, canola, or sunflower oil.  Avoid cooking with butter, cream, or high-fat meats. Meal planning  Eat meals and snacks regularly, preferably at the same times every day. Avoid going long periods of time without eating.  Eat foods high in fiber, such as fresh fruits, vegetables, beans, and whole grains. Talk to your dietitian about how many servings of carbohydrates you can eat at each meal.  Eat 4-6 ounces of lean protein each day, such as lean meat, chicken, fish, eggs, or tofu. 1 ounce is equal to 1 ounce of meat, chicken, or fish, 1 egg, or 1/4 cup of tofu.  Eat some foods each day that contain healthy fats, such as avocado, nuts, seeds, and fish. Lifestyle   Check your blood glucose regularly.  Exercise at least 30 minutes 5 or more days each week, or as told by your health care provider.  Take medicines as told by your health care provider.  Do not use any products that contain nicotine or tobacco, such as cigarettes and e-cigarettes. If you need help quitting, ask your health care provider.  Work with a Social worker or diabetes educator to identify strategies to manage stress and any emotional and social challenges. What are some questions to ask my health care provider?  Do I need to meet with a diabetes educator?  Do I need to meet with a dietitian?  What number can I call if I have questions?  When are the best times to check my  blood glucose? Where to find more information:  American Diabetes Association: diabetes.org/food-and-fitness/food  Academy of Nutrition and Dietetics: PokerClues.dk  [name] of Diabetes and Digestive and Kidney Diseases (NIH): ContactWire.be Summary  A healthy meal plan will help you control your blood glucose and maintain a healthy lifestyle.  Working with a diet and nutrition specialist (dietitian) can help you make a meal plan that is best for you.  Keep in mind that carbohydrates and alcohol have immediate effects on your blood glucose levels. It is important to count carbohydrates and to use alcohol carefully. This information is not intended to replace advice given to you by your health care provider. Make sure you discuss any questions you have with your health care provider. Document Released: 08/03/2005 Document Revised: 12/11/2016 Document Reviewed: 12/11/2016 Elsevier Interactive Patient Education  [employer].

## 2018-01-17 NOTE — Assessment & Plan Note (Signed)
Patient is currently on lisinopril/hydrochlorothiazide combination pill. She is tolerating the medicine without side effects. She denies symptoms concerning for orthostatic hypotension. Blood pressure is currently at goal.   Plan: - Continue lisinopril/HCTZ combo pill

## 2018-01-17 NOTE — Assessment & Plan Note (Signed)
Patient was sent for screening mammogram

## 2018-01-21 NOTE — Progress Notes (Signed)
Internal Medicine Clinic Attending  Case discussed with Dr. Helberg at the time of the visit.  We reviewed the resident's history and exam and pertinent patient test results.  I agree with the assessment, diagnosis, and plan of care documented in the resident's note.    

## 2018-01-21 NOTE — Addendum Note (Signed)
Addended by: Aldine Contes on: 01/21/2018 10:40 AM   Modules accepted: Level of Service

## 2018-03-26 ENCOUNTER — Ambulatory Visit: Payer: PRIVATE HEALTH INSURANCE

## 2018-03-28 ENCOUNTER — Ambulatory Visit (INDEPENDENT_AMBULATORY_CARE_PROVIDER_SITE_OTHER): Payer: PRIVATE HEALTH INSURANCE | Admitting: Internal Medicine

## 2018-03-28 ENCOUNTER — Encounter: Payer: Self-pay | Admitting: Internal Medicine

## 2018-03-28 VITALS — BP 127/77 | HR 83 | Wt 231.9 lb

## 2018-03-28 DIAGNOSIS — R531 Weakness: Secondary | ICD-10-CM

## 2018-03-28 DIAGNOSIS — R5383 Other fatigue: Secondary | ICD-10-CM

## 2018-03-28 DIAGNOSIS — R52 Pain, unspecified: Secondary | ICD-10-CM | POA: Diagnosis not present

## 2018-03-28 MED ORDER — DULOXETINE HCL 30 MG PO CPEP: 30.0000 mg | ORAL_CAPSULE | Freq: Every day | ORAL | 0 refills | Status: DC

## 2018-03-28 NOTE — Progress Notes (Signed)
   CC: Whole body pain  HPI:[name] is a 52 y.o. female who presents today for evaluation of whole body pain.  Whole body pain: The patient endorsed recently worsening whole body pain that involves her deltoids, mid back, lower arms and wrists with mild tenderness to touch of these areas. This has been an issue for greater than a year and more noticeable now for four months but is no longer controlled with ibuprofen and causing prominent disruption in her daily life. In addition she endorsed generalized weakness and fatigue described as just feeling wiped out all the time. She denied rash, focal weakness,  joint pain or swelling or tenderness to palpation of the joints. Also endorsed headache occassionally associated with the symptoms.  Physical exam failed to demonstrate joint pain, joint resistance, easy fatigability of the muscles, or joint effusion. There was notable tenderness of the deltoids, forearms, midback, of note.   Plan:  Uncertain etiology, possibly fibromyalgia.  TSH-WNL's at 1.23 CK WNL's at 132 Vit D pending ESR elevated to 44 as well as the CRP elevated to 27.5, as such we will recommend additional testing including a rheumatoide factor although they both may be elevated in patients with fibromyalgia.  Given the severity of her symptoms we will begin treatment with cymbalta '30mg'$  daily and see the patient in one week for evaluation of her pain.   Past Medical History:  Diagnosis Date  . Asthma    Required inhaled corticosteroid in past.   . Depression, major 02/25/2013   First episode that required was 2014. Prior episodes that did not require pt to seek medical tx.  . Hypertension    2 drug therapy  . OSA (obstructive sleep apnea)    Sleep study 12/2006 : Moderately Severe OSA. AHI 38.8. O2 sat decreased to 71%.  . Severe obesity (BMI >= 40) (HCC) 10/08/2012   Obesity Class 3. BMI > 40.   . Tobacco abuse    Review of Systems:  ROS negative except as per  HPI.  Physical Exam:  Vitals:   03/28/18 1558  BP: 127/77  Pulse: 83  SpO2: 99%  Weight: 231 lb 14.4 oz (105.2 kg)   Please see A/P portion for additional physical exam findings.  General: Alert and oriented, in on acute distress, afebrile, nondiaphoretic Cardio: RRR, no murmurs rubs or gallops Pulm: lungs clear to auscultation bilaterally  Musck: see problem based assessment and plan. Strength 5/5 upper and lower bilaterally and symetric. Sensation intact. Skin: no rash noted on the face or extremities.   Assessment & Plan:   See Encounters Tab for problem based charting.  Patient seen with Dr. Dareen Piano

## 2018-03-28 NOTE — Progress Notes (Signed)
Made worse with unknown  Better with unknown, Ibuprofen

## 2018-03-28 NOTE — Patient Instructions (Signed)
FOLLOW-UP INSTRUCTIONS When: In one week for evaluation of your pain.   Please call and schedule tomorrow morning for an appointment in one week. We would like to assess your pain level at that time and make any medication adjustments as indicated.   Thank you for your visit to the [facility].

## 2018-03-30 DIAGNOSIS — R52 Pain, unspecified: Secondary | ICD-10-CM | POA: Insufficient documentation

## 2018-03-30 NOTE — Assessment & Plan Note (Signed)
Whole body pain: The patient endorsed recently worsening whole body pain that involves her deltoids, mid back, lower arms and wrists with mild tenderness to touch of these areas. This has been an issue for greater than a year and more noticeable now for four months but is no longer controlled with ibuprofen and causing prominent disruption in her daily life. In addition she endorsed generalized weakness and fatigue described as just feeling wiped out all the time. She denied rash, focal weakness,  joint pain or swelling or tenderness to palpation of the joints. Also endorsed headache occassionally associated with the symptoms.  Physical exam failed to demonstrate joint pain, joint resistance, easy fatigability of the muscles, or joint effusion. There was notable tenderness of the deltoids, forearms, midback, of note.   Plan:  Uncertain etiology, possibly fibromyalgia.  TSH-WNL's at 1.23 CK WNL's at 132 Vit D pending ESR elevated to 44 as well as the CRP elevated to 27.5, as such we will recommend additional testing including a rheumatoide factor although they both may be elevated in patients with fibromyalgia.  Given the severity of her symptoms we will begin treatment with cymbalta 45m daily and see the patient in one week for evaluation of her pain.

## 2018-04-01 NOTE — Progress Notes (Signed)
Internal Medicine Clinic Attending  Case discussed with Dr. Harbrecht at the time of the visit.  We reviewed the resident's history and exam and pertinent patient test results.  I agree with the assessment, diagnosis, and plan of care documented in the resident's note.   

## 2018-04-02 LAB — C-REACTIVE PROTEIN: CRP: 27.5 mg/L — ABNORMAL HIGH (ref 0.0–4.9)

## 2018-04-02 LAB — SEDIMENTATION RATE: Sed Rate: 44 mm/hr — ABNORMAL HIGH (ref 0–40)

## 2018-04-02 LAB — VITAMIN D 1,25 DIHYDROXY
VITAMIN D3 1, 25 (OH): 35 pg/mL
Vitamin D 1, 25 (OH)2 Total: 35 pg/mL
Vitamin D2 1, 25 (OH)2: <10 pg/mL

## 2018-04-02 LAB — CK: Total CK: 132 U/L (ref 24–173)

## 2018-04-02 LAB — TSH: TSH: 1.23 u[IU]/mL (ref 0.450–4.500)

## 2018-04-04 ENCOUNTER — Telehealth: Payer: Self-pay | Admitting: Internal Medicine

## 2018-04-04 NOTE — Telephone Encounter (Signed)
Spoke with patient regarding labs. She was informed of the elevated inflammatory markers that at times can be so in fibromyalgia. I do have a lower suspicion for fibromyalgia at this point though and suggested to the patient that she continue the cymbalta at this time and when she sees Dr. Tarri Abernethy on 5/23 she should discuss continuation vs medication changes at that time. She endorsed mild "foggieness" with the cymbalta that seems to have stabilized. I informed her that if the feeling persisted or was unbearable that she should discontinue the treatment and follow-up with Dr. Tarri Abernethy for re-evaluation.

## 2018-04-04 NOTE — Telephone Encounter (Signed)
PT states she had labs drawn on 03/28/2018 and has not heard back from the Dr.  Abbott Pao would like a call back about her test results.

## 2018-04-11 ENCOUNTER — Encounter: Payer: Self-pay | Admitting: Internal Medicine

## 2018-04-11 ENCOUNTER — Ambulatory Visit: Payer: PRIVATE HEALTH INSURANCE | Admitting: Internal Medicine

## 2018-04-11 ENCOUNTER — Other Ambulatory Visit: Payer: Self-pay

## 2018-04-11 VITALS — BP 147/82 | HR 79 | Temp 98.7°F | Ht 61.0 in | Wt 232.9 lb

## 2018-04-11 DIAGNOSIS — Z7984 Long term (current) use of oral hypoglycemic drugs: Secondary | ICD-10-CM

## 2018-04-11 DIAGNOSIS — M797 Fibromyalgia: Secondary | ICD-10-CM

## 2018-04-11 DIAGNOSIS — R7303 Prediabetes: Secondary | ICD-10-CM

## 2018-04-11 DIAGNOSIS — Z79899 Other long term (current) drug therapy: Secondary | ICD-10-CM | POA: Diagnosis not present

## 2018-04-11 DIAGNOSIS — E119 Type 2 diabetes mellitus without complications: Secondary | ICD-10-CM | POA: Diagnosis not present

## 2018-04-11 DIAGNOSIS — I10 Essential (primary) hypertension: Secondary | ICD-10-CM

## 2018-04-11 LAB — POCT GLYCOSYLATED HEMOGLOBIN (HGB A1C): HEMOGLOBIN A1C: 7.5 % — AB (ref 4.0–5.6)

## 2018-04-11 LAB — GLUCOSE, CAPILLARY: GLUCOSE-CAPILLARY: 125 mg/dL — AB (ref 65–99)

## 2018-04-11 MED ORDER — SITAGLIPTIN PHOSPHATE 100 MG PO TABS: 100.0000 mg | ORAL_TABLET | Freq: Every day | ORAL | 1 refills | Status: DC

## 2018-04-11 NOTE — Patient Instructions (Signed)
Thank you for allowing [place] to provide your care. Today we:  1. Started Sitagliptin 100 mg once daily   2. Continue to work on weight loss and exercise   I will see you back in 3 months.

## 2018-04-11 NOTE — Progress Notes (Signed)
   CC: Diabetes mellitus   HPI:  Crystal English is a 52 y.o. female who presented to the clinic for continued evaluation and management of her chronic medical illnesses. For a detailed assessment and plan please refer to problem based charting below.   Past Medical History:  Diagnosis Date  . Asthma    Required inhaled corticosteroid in past.   . Depression, major 02/25/2013   First episode that required was 2014. Prior episodes that did not require pt to seek medical tx.  . Hypertension    2 drug therapy  . OSA (obstructive sleep apnea)    Sleep study 12/2006 : Moderately Severe OSA. AHI 38.8. O2 sat decreased to 71%.  . Severe obesity (BMI >= 40) (HCC) 10/08/2012   Obesity Class 3. BMI > 40.   . Tobacco abuse    Review of Systems:   Denies oral ulcers, genital ulcers, swollen/erythematous joints, new rash on the extremities or face, h/o seizures, h/o pleurisies, family h/o autoimmune conditions.   Physical Exam: Vitals:   04/11/18 1556  BP: (!) 147/82  Pulse: 79  Temp: 98.7 F (37.1 C)  TempSrc: Oral  SpO2: 99%  Weight: 232 lb 14.4 oz (105.6 kg)  Height: 5\' 1"  (1.549 m)   General: Obese female in no acute distress Pulm: Good air movement with no wheezing or crackles  CV: RRR, no murmurs, no rubs  Abdomen: Active bowel sounds, soft, non-distended, no tenderness to palpation  Extremities: No LE edema   Assessment & Plan:   See Encounters Tab for problem based charting.  Patient discussed with Dr. Beryle Beams

## 2018-04-14 ENCOUNTER — Encounter: Payer: Self-pay | Admitting: Internal Medicine

## 2018-04-14 DIAGNOSIS — M797 Fibromyalgia: Secondary | ICD-10-CM | POA: Insufficient documentation

## 2018-04-14 NOTE — Assessment & Plan Note (Signed)
Patient presented for follow-up on her DM. The patient has tried 3 months of lifestyle modification and will continue to work towards weight loss. She tries to avoid sweets but is a stress eater and does eat sweets when stressed. She is reluctant to try medications today but we discussed the long-term complications of uncontrolled DM including CAV, MI, nephropathy, and neuropathy. She is amendable to trying medical management. We discussed metformin vs a DDP-4 inhibitor (does not want an injection or a medication that will make her pee a lot). After discussing the advantages and disadvantages of both she elected to try the DDP-4 inhibitor. We discussed continued lifestyle modification and weight loss. She will follow-up with me in 3 months and her goal weight is 220lbs.   Plan: - Start Sitagliptin 100 mg QD  - Continue to work on lifestyle modifications  - Follow-up in 3 months

## 2018-04-14 NOTE — Assessment & Plan Note (Signed)
Patient was evaluated in clinic approximately 1 month prior with mental slowing and diffuse myalgias. Labs were obtained and the diagnosis of Fibromyalgia was made. She was started on Duloxetine and has had good success since that time. Her myalgias have resolved. She has been diagnosed with fibromyalgia in the past. She denies a h/o oral ulcers, genital ulcers, swollen/erythematous joints, new rash on the extremities or face, h/o seizures, h/o pleurisies, family h/o autoimmune conditions. We discussed treatment options for fibromyalgia and the importance of physical activity. We will continue Duloxetine.

## 2018-04-14 NOTE — Assessment & Plan Note (Signed)
Patient is currently on a Lisinopril/HCTZ combination pill. She is tolerating the medication without adverse effects and denies symptoms of orthostasis. She does not take her medication daily and did not take it today. In the past her BP has been well controlled on the medication. We will not make any changes to medical management today.   Plan:  - Continue Lisinopril/HCTZ combo pill

## 2018-04-16 NOTE — Progress Notes (Signed)
Medicine attending: Medical history, presenting problems, physical findings, and medications, reviewed with resident physician Dr Justin Helberg on the day of the patient visit and I concur with his evaluation and management plan. 

## 2018-04-27 ENCOUNTER — Telehealth: Payer: Self-pay | Admitting: Internal Medicine

## 2018-04-27 DIAGNOSIS — R52 Pain, unspecified: Secondary | ICD-10-CM

## 2018-04-27 MED ORDER — DULOXETINE HCL 30 MG PO CPEP: 30.0000 mg | ORAL_CAPSULE | Freq: Every day | ORAL | 0 refills | Status: DC

## 2018-04-27 NOTE — Telephone Encounter (Signed)
  INTERNAL MEDICINE RESIDENCY PROGRAM After-Hours Telephone Call    Reason for call:   I received a call from [name] at 2:15 PM, 04/27/2018 indicating she needed a refill of her Cymbalta.    Pertinent Data:   Patient notes she ran out of her Cymbalta and is experiencing increased pain from her fibromyalgia. Requests refill    Assessment / Plan / Recommendations:   Seems to be tolerating medicine well and notes improvement in her widespread pain. Refill of Cymbalta 30mg  daily sent to pharmacy.   As always, pt is advised that if symptoms worsen or new symptoms arise, they should go to an urgent care facility or to to ER for further evaluation.    [name], DO   04/27/2018, 2:15 PM

## 2018-05-27 ENCOUNTER — Other Ambulatory Visit: Payer: Self-pay

## 2018-05-27 ENCOUNTER — Encounter: Payer: Self-pay | Admitting: Internal Medicine

## 2018-05-27 ENCOUNTER — Ambulatory Visit (INDEPENDENT_AMBULATORY_CARE_PROVIDER_SITE_OTHER): Payer: PRIVATE HEALTH INSURANCE | Admitting: Internal Medicine

## 2018-05-27 VITALS — BP 135/80 | HR 89 | Temp 98.4°F | Ht 61.0 in | Wt 225.7 lb

## 2018-05-27 DIAGNOSIS — Z6841 Body Mass Index (BMI) 40.0 and over, adult: Secondary | ICD-10-CM

## 2018-05-27 DIAGNOSIS — M797 Fibromyalgia: Secondary | ICD-10-CM | POA: Diagnosis not present

## 2018-05-27 DIAGNOSIS — Z79899 Other long term (current) drug therapy: Secondary | ICD-10-CM | POA: Diagnosis not present

## 2018-05-27 DIAGNOSIS — I10 Essential (primary) hypertension: Secondary | ICD-10-CM

## 2018-05-27 DIAGNOSIS — G4733 Obstructive sleep apnea (adult) (pediatric): Secondary | ICD-10-CM | POA: Diagnosis not present

## 2018-05-27 DIAGNOSIS — E119 Type 2 diabetes mellitus without complications: Secondary | ICD-10-CM | POA: Diagnosis not present

## 2018-05-27 MED ORDER — LISINOPRIL-HYDROCHLOROTHIAZIDE 20-25 MG PO TABS: 1.0000 | ORAL_TABLET | Freq: Every day | ORAL | 0 refills | Status: DC

## 2018-05-27 NOTE — Progress Notes (Signed)
   CC: follow-up of OSA  HPI:  [name] is a 51 y.o. F with history of moderately severe OSA currently not on CPAP, HTN, type 2 diabetes and morbid obesity who presents today requesting referral for sleep study.   For details regarding today's visit and the status of their chronic medical issues, please refer to the assessment and plan. She has no acute complaints.   Past Medical History:  Diagnosis Date  . Asthma    Required inhaled corticosteroid in past.   . Depression, major 02/25/2013   First episode that required was 2014. Prior episodes that did not require pt to seek medical tx.  . Hypertension    2 drug therapy  . OSA (obstructive sleep apnea)    Sleep study 12/2006 : Moderately Severe OSA. AHI 38.8. O2 sat decreased to 71%.  . Severe obesity (BMI >= 40) (HCC) 10/08/2012   Obesity Class 3. BMI > 40.   . Tobacco abuse    Review of Systems:   General: Admits to intentional weight loss. Admits to daytime fatigue. Denies fevers, chills HEENT: Denies acute changes in vision, headache Cardiac: Denies CP, SOB Pulmonary: Admits to PND and snoring. Denies cough or wheezing Abd: Denies abdominal pain or diarrhea Extremities: Denies weakness or swelling  Physical Exam: General: Alert, in no acute distress. Pleasant and conversant HEENT: No icterus, injection or ptosis. No hoarseness or dysarthria  Cardiac: RRR Pulmonary: CTA BL with normal WOB on RA. Able to speak in complete sentences.  Abd: Obese abdomen. Soft, not distended.  Extremities: No significant pedal edema.   Vitals:   05/27/18 1554  BP: 135/80  Pulse: 89  Temp: 98.4 F (36.9 C)  TempSrc: Oral  SpO2: 99%  Weight: 225 lb 11.2 oz (102.4 kg)  Height: 5\' 1"  (1.549 m)   Body mass index is 42.65 kg/m.  Assessment & Plan:   See Encounters Tab for problem based charting.  Patient discussed with Dr. Dareen Piano

## 2018-05-27 NOTE — Patient Instructions (Addendum)
It was nice seeing you today. Thank you for choosing [facility] for your Primary Care.   Today we talked about:  1) Sleep apnea: I've referred you for a sleep study. They will call you with an appointment.  2) High blood pressure: Your blood pressure is well controlled today. I've sent in a refill of your medicine to your pharmacy.  3) Fibromyalgia: I'm so glad the cymbalta is helping! I've sent a refill to your pharmacy.  4 ) Weight loss: KEEP UP THE GOOD WORK!!   FOLLOW-UP INSTRUCTIONS When: 6-8 weeks For: High blood sugar follow-up  Please contact the clinic if you have any problems, or need to be seen sooner.

## 2018-05-27 NOTE — Assessment & Plan Note (Signed)
Assessment:  Blood pressure 135/80 with pulse 89 today and reports compliance with Lisinopril-HCTZ 20-25mg  daily. Does report some lightheadedness after heavy exertion but otherwise no complaints.       Plan: Refill given for Lisinopril-HCTZ. Last renal function normal 4/18; she has follow-up with her PCP in a few weeks who can repeat labs if he feels its indicated.

## 2018-05-27 NOTE — Assessment & Plan Note (Addendum)
Assessment:  Patient with known history of moderate-severe OSA previously on CPAP who presents today requesting referral for sleep study. She endorses feeling fatigued throughout the day with poor sleep and occasionally wakes up feeling like she's gasping for air. She knows she snores and while BMI is improving with her recent weight loss, it is still >40%. STOPBANG score 7 indicating high risk for OSA.     Plan: Patient referred for sleep study today. Encouraged continued weight loss as well.

## 2018-05-27 NOTE — Assessment & Plan Note (Signed)
Assessment:  Patient with 6lb weight loss since her last clinic visit! She has been working on her diet and exercising (usually Zumba a few times a week). She notes her first weight loss goal was 220 lbs and hit this on her scale at home.      Plan: Applauded patient and encouraged her to continue the hard work and dedication towards weight loss. She will follow-up with PCP in 6-8 weeks for further follow-up.

## 2018-05-27 NOTE — Addendum Note (Signed)
Addended by: Tamsen Roers on: 05/27/2018 05:09 PM   Modules accepted: Orders

## 2018-05-29 NOTE — Progress Notes (Signed)
Internal Medicine Clinic Attending  Case discussed with Dr. Molt at the time of the visit.  We reviewed the resident's history and exam and pertinent patient test results.  I agree with the assessment, diagnosis, and plan of care documented in the resident's note. 

## 2018-05-31 ENCOUNTER — Encounter: Payer: Self-pay | Admitting: Internal Medicine

## 2018-05-31 ENCOUNTER — Other Ambulatory Visit: Payer: Self-pay

## 2018-05-31 ENCOUNTER — Telehealth: Payer: Self-pay

## 2018-05-31 ENCOUNTER — Ambulatory Visit: Payer: PRIVATE HEALTH INSURANCE | Admitting: Internal Medicine

## 2018-05-31 VITALS — BP 131/95 | HR 86 | Temp 98.5°F | Ht 61.0 in | Wt 226.1 lb

## 2018-05-31 DIAGNOSIS — M5010 Cervical disc disorder with radiculopathy, unspecified cervical region: Secondary | ICD-10-CM

## 2018-05-31 DIAGNOSIS — R52 Pain, unspecified: Secondary | ICD-10-CM

## 2018-05-31 MED ORDER — DULOXETINE HCL 60 MG PO CPEP: 60.0000 mg | ORAL_CAPSULE | Freq: Every day | ORAL | 0 refills | Status: DC

## 2018-05-31 MED ORDER — IBUPROFEN 800 MG PO TABS: 800.0000 mg | ORAL_TABLET | Freq: Three times a day (TID) | ORAL | 0 refills | Status: DC | PRN

## 2018-05-31 NOTE — Telephone Encounter (Signed)
Requesting to speak with a nurse about something. Please call pt back.  °

## 2018-05-31 NOTE — Patient Instructions (Addendum)
Crystal English, we will increase your cymbalta dose today as it is working well for you but there is still some residual pain.  I will refill your ibuprofen as well as you are not using this very often.   Cervical Radiculopathy Cervical radiculopathy happens when a nerve in the neck (cervical nerve) is pinched or bruised. This condition can develop because of an injury or as part of the normal aging process. Pressure on the cervical nerves can cause pain or numbness that runs from the neck all the way down into the arm and fingers. Usually, this condition gets better with rest. Treatment may be needed if the condition does not improve. What are the causes? This condition may be caused by:  Injury.  Slipped (herniated) disk.  Muscle tightness in the neck because of overuse.  Arthritis.  Breakdown or degeneration in the bones and joints of the spine (spondylosis) due to aging.  Bone spurs that may develop near the cervical nerves.  What are the signs or symptoms? Symptoms of this condition include:  Pain that runs from the neck to the arm and hand. The pain can be severe or irritating. It may be worse when the neck is moved.  Numbness or weakness in the affected arm and hand.  How is this diagnosed? This condition may be diagnosed based on symptoms, medical history, and a physical exam. You may also have tests, including:  X-rays.  CT scan.  MRI.  Electromyogram (EMG).  Nerve conduction tests.  How is this treated? In many cases, treatment is not needed for this condition. With rest, the condition usually gets better over time. If treatment is needed, options may include:  Wearing a soft neck collar for short periods of time.  Physical therapy to strengthen your neck muscles.  Medicines, such as NSAIDs, oral corticosteroids, or spinal injections.  Surgery. This may be needed if other treatments do not help. Various types of surgery may be done depending on the cause of  your problems.  Follow these instructions at home: Managing pain  Take over-the-counter and prescription medicines only as told by your health care provider.  If directed, apply ice to the affected area. ? Put ice in a plastic bag. ? Place a towel between your skin and the bag. ? Leave the ice on for 20 minutes, 2-3 times per day.  If ice does not help, you can try using heat. Take a warm shower or warm bath, or use a heat pack as told by your health care provider.  Try a gentle neck and shoulder massage to help relieve symptoms. Activity  Rest as needed. Follow instructions from your health care provider about any restrictions on activities.  Do stretching and strengthening exercises as told by your health care provider or physical therapist. General instructions  If you were given a soft collar, wear it as told by your health care provider.  Use a flat pillow when you sleep.  Keep all follow-up visits as told by your health care provider. This is important. Contact a health care provider if:  Your condition does not improve with treatment. Get help right away if:  Your pain gets much worse and cannot be controlled with medicines.  You have weakness or numbness in your hand, arm, face, or leg.  You have a high fever.  You have a stiff, rigid neck.  You lose control of your bowels or your bladder (have incontinence).  You have trouble with walking, balance, or speaking. This  information is not intended to replace advice given to you by your health care provider. Make sure you discuss any questions you have with your health care provider. Document Released: 08/01/2001 Document Revised: 04/13/2016 Document Reviewed: 12/31/2014 Elsevier Interactive Patient Education  [employer].

## 2018-05-31 NOTE — Telephone Encounter (Signed)
Pt calls and sates she is having worse pain in neck, shoulder, ears, she states she has researched her symptoms and believes she has found what is wrong based on research and what she was told by a neurologist several years back. Desires appt. 7/12 at [facility]

## 2018-05-31 NOTE — Progress Notes (Signed)
CC: cervical radiculopathy  HPI:  Crystal English is a 52 y.o. female with PMH below.  She is here to address her continued neck pain that seems consistent with cervical radiculopathy.    Please see A&P for status of the patient's chronic medical conditions  Past Medical History:  Diagnosis Date  . Asthma    Required inhaled corticosteroid in past.   . Depression, major 02/25/2013   First episode that required was 2014. Prior episodes that did not require pt to seek medical tx.  . Hypertension    2 drug therapy  . OSA (obstructive sleep apnea)    Sleep study 12/2006 : Moderately Severe OSA. AHI 38.8. O2 sat decreased to 71%.  . Severe obesity (BMI >= 40) (HCC) 10/08/2012   Obesity Class 3. BMI > 40.   . Tobacco abuse    Review of Systems:  ROS: Pulmonary: pt denies increased work of breathing, shortness of breath,  Cardiac: pt denies palpitations, chest pain,  Abdominal: pt denies abdominal pain, nausea, vomiting, or diarrhea  Physical Exam:  Vitals:   05/31/18 1507  BP: (!) 131/95  Pulse: 86  Temp: 98.5 F (36.9 C)  TempSrc: Oral  SpO2: 94%  Weight: 226 lb 1.6 oz (102.6 kg)  Height: 5\' 1"  (1.549 m)   Physical Exam  Constitutional: No distress.  Cardiovascular: Normal rate, regular rhythm and normal heart sounds. Exam reveals no gallop and no friction rub.  No murmur heard. Pulmonary/Chest: Effort normal and breath sounds normal. No respiratory distress. She has no wheezes. She has no rales. She exhibits no tenderness.  Musculoskeletal:  No tenderness to palpation over the cervical spine, normal range of motion of the neck.  No tenderness of trapezius bilaterally.  Could not elicit pain on exam today.    Neurological: She is alert.  Preserved strength and sensation in bilateral upper extremities from hand to shoulder.    Skin: She is not diaphoretic.    Social History   Socioeconomic History  . Marital status: Single    Spouse name: Not on file  .  Number of children: 3  . Years of education: some coll.  . Highest education level: Not on file  Occupational History  . Occupation: Clinical cytogeneticist: [employer]  . Financial resource strain: Not on file  . Food insecurity:    Worry: Not on file    Inability: Not on file  . Transportation needs:    Medical: Not on file    Non-medical: Not on file  Tobacco Use  . Smoking status: Former Smoker    Packs/day: 0.03    Years: 30.00    Pack years: 0.90    Types: Cigarettes    Last attempt to quit: 05/21/2015    Years since quitting: 3.0  . Smokeless tobacco: Never Used  . Tobacco comment: 3 cigs per day  Substance and Sexual Activity  . Alcohol use: Yes    Alcohol/week: 0.0 oz    Comment: rarely.  . Drug use: No  . Sexual activity: Not on file  Lifestyle  . Physical activity:    Days per week: Not on file    Minutes per session: Not on file  . Stress: Not on file  Relationships  . Social connections:    Talks on phone: Not on file    Gets together: Not on file    Attends religious service: Not on file    Active member  of club or organization: Not on file    Attends meetings of clubs or organizations: Not on file    Relationship status: Not on file  . Intimate partner violence:    Fear of current or ex partner: Not on file    Emotionally abused: Not on file    Physically abused: Not on file    Forced sexual activity: Not on file  Other Topics Concern  . Not on file  Social History Narrative   Lives in [place] alone. 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    Family History  Problem Relation Age of Onset  . Stroke Father        in his 58's  . Heart disease Father        died <47 y.o  . Diabetes Mother   . Hypertension Mother   . Arthritis Mother   . Hyperlipidemia Mother   . Diabetes Maternal Aunt   . Cancer Maternal Aunt        breast   . Heart disease Maternal Grandmother   . Asthma Maternal Aunt     Assessment &  Plan:   See Encounters Tab for problem based charting.  Patient discussed with Dr. Dareen Piano

## 2018-05-31 NOTE — Assessment & Plan Note (Addendum)
Pt reports symptoms today consistent with cervical radiculopathy, pain in neck radiating down arm on right side with intermittent numbness of right phalanges.  She reports good result with cymbalta and her pain is down significantly.  Her exam is very reassuring could not elicit any pain on exam, preserved strength and sensation.  It is clear from imaging she does have arthritis of the area and some disc protrusion.    -will increase cymbalta to 60mg  daily today -also refilled her ibuprofen 800mg  daily PRN uses very sparingly for refractory pain last one month supply lasted about 5 months -encouraged continued use of heating pads, discussed switching to memory foam pillow

## 2018-06-01 NOTE — Assessment & Plan Note (Signed)
Pt requires refills on medications with associated diagnosis above.  Reviewed disease process and find this medication to be necessary, will not change dose or alter current therapy. 

## 2018-06-03 NOTE — Progress Notes (Signed)
Internal Medicine Clinic Attending  Case discussed with Dr. Winfrey  at the time of the visit.  We reviewed the resident's history and exam and pertinent patient test results.  I agree with the assessment, diagnosis, and plan of care documented in the resident's note.  

## 2018-08-12 ENCOUNTER — Telehealth: Payer: Self-pay | Admitting: Internal Medicine

## 2018-08-12 DIAGNOSIS — G4733 Obstructive sleep apnea (adult) (pediatric): Secondary | ICD-10-CM

## 2018-08-12 NOTE — Telephone Encounter (Signed)
New split night sleep study ordered. Thank you.

## 2018-08-12 NOTE — Telephone Encounter (Signed)
Unable to schedule sleep study with current order. New order requested by facility and patient. New order placed.

## 2018-08-12 NOTE — Telephone Encounter (Signed)
Spoke with Terri @ [facility].  They are unable to scheduled the patient from the old order in 05/2018 as it release for scheduling .  They are requesting that a new order be placed as soon as possible for scheduling.  The pt states she has been waiting for 2 months.  Please advise.

## 2018-08-20 ENCOUNTER — Encounter: Payer: Self-pay | Admitting: Internal Medicine

## 2018-08-20 ENCOUNTER — Ambulatory Visit (INDEPENDENT_AMBULATORY_CARE_PROVIDER_SITE_OTHER): Payer: PRIVATE HEALTH INSURANCE | Admitting: Internal Medicine

## 2018-08-20 ENCOUNTER — Other Ambulatory Visit: Payer: Self-pay

## 2018-08-20 VITALS — BP 154/97 | HR 80 | Temp 98.2°F | Ht 61.0 in | Wt 231.7 lb

## 2018-08-20 DIAGNOSIS — M545 Low back pain, unspecified: Secondary | ICD-10-CM

## 2018-08-20 DIAGNOSIS — Z791 Long term (current) use of non-steroidal anti-inflammatories (NSAID): Secondary | ICD-10-CM

## 2018-08-20 DIAGNOSIS — F339 Major depressive disorder, recurrent, unspecified: Secondary | ICD-10-CM

## 2018-08-20 DIAGNOSIS — G8921 Chronic pain due to trauma: Secondary | ICD-10-CM

## 2018-08-20 DIAGNOSIS — I10 Essential (primary) hypertension: Secondary | ICD-10-CM

## 2018-08-20 DIAGNOSIS — E119 Type 2 diabetes mellitus without complications: Secondary | ICD-10-CM

## 2018-08-20 DIAGNOSIS — Z9181 History of falling: Secondary | ICD-10-CM

## 2018-08-20 DIAGNOSIS — Z79899 Other long term (current) drug therapy: Secondary | ICD-10-CM

## 2018-08-20 DIAGNOSIS — G4733 Obstructive sleep apnea (adult) (pediatric): Secondary | ICD-10-CM

## 2018-08-20 DIAGNOSIS — M797 Fibromyalgia: Secondary | ICD-10-CM

## 2018-08-20 NOTE — Patient Instructions (Addendum)
Ms. Oldfield, It was a pleasure meeting you today. You most likely have strained the muscles/tendons in your low back. This can take up to 3 months to heal. As we discussed, continue to stay as active as possible without overdoing it. Try taking you Ibuprofen 800 mg three times daily for the next 2 weeks to see if this makes a difference. You can also try heat to help loosen up the muscles.  As we discussed, please seek immediate attention if you have worsening pain that keeps you awake all night, pain down your legs, fever, or bowel/bladder incontinence.   Keep up the great work with your exercise and attempt at weight loss!   Take care and call any time with questions or concerns.

## 2018-08-20 NOTE — Assessment & Plan Note (Addendum)
Patient's symptoms most consistent with lumbar strain given history of increased activity when pain began. Plan to increase Ibuprofen 800 mg to TID x2 weeks to see if she gets any improvement. Also advised to continue moderate activity as tolerated and to avoid complete rest, as this can prolong the issue. No radicular  symptoms concerning for disc herniation.  If her symptoms do not resolve within 3 months, will work-up for other possible etiologies. On exam, she had pain with extension which could indicate inflammation of facet joints. Would also pursue MRI to rule out spinal stenosis if she continues to have persistent pain with prolonged standing and ambulation.  She was advised to seek immediate attention if she were to develop fever, severe pain that keeps her awake at night, bowel/bladder incontinence, or saddle anesthesia.

## 2018-08-20 NOTE — Progress Notes (Signed)
Patient ID: Crystal English, female   DOB: [birth year], 52 y.o.   MRN: [id number]  I saw and evaluated the patient. I personally confirmed the key portions of Dr. Janne Napoleon history and exam and reviewed pertinent patient test results. The assessment, diagnosis, and plan were formulated together and I agree with the documentation in the resident's note.

## 2018-08-20 NOTE — Assessment & Plan Note (Signed)
Patient complains of daytime sleepiness. She had a sleep study back in 2008 that revealed moderately severe OSA. She has never used a CPAP machine. She is requesting a repeat sleep study to evaluate need for CPAP.

## 2018-08-20 NOTE — Progress Notes (Signed)
   CC: low back pain  HPI:  [name] is a 52 y.o. woman with PMHx of HTN, Depression, DM, OSA, Fibromyalgia who presents with 1 month of acute on chronic low back pain. She has had pain for years ever since a fall down cement steps several years ago and was told she injured her L4-L5. Her pain worsening a month after trying a more intense exercise class than what she is used to. The pain is described as an intermittent ache that is worse with prolonged standing or walking. She take Cymbalta 60 mg daily for Fibromyalgia, in addition to Ibuprofen 800 mg prn at bedtime. She has not tried increasing frequency of Ibuprofen. Her main concern today is to ensure it is not a problem with her kidneys or her disc.  She denies any shooting pains, numbness, tingling or weakness in her lower extremities. No bowel or bladder incontinence. Denies any recent trauma.   Past Medical History:  Diagnosis Date  . Asthma    Required inhaled corticosteroid in past.   . Depression, major 02/25/2013   First episode that required was 2014. Prior episodes that did not require pt to seek medical tx.  . Hypertension    2 drug therapy  . OSA (obstructive sleep apnea)    Sleep study 12/2006 : Moderately Severe OSA. AHI 38.8. O2 sat decreased to 71%.  . Severe obesity (BMI >= 40) (HCC) 10/08/2012   Obesity Class 3. BMI > 40.   . Tobacco abuse    Review of Systems:   Constitutional:Denies fevers, chills Neuro: No bowel or bladder incontinence Back: denies pain that keeps her awake at night GU: no dysuria or hematuria   Physical Exam:  Vitals:   08/20/18 1420  BP: (!) 154/97  Pulse: 80  Temp: 98.2 F (36.8 C)  TempSrc: Oral  SpO2: 95%  Weight: 231 lb 11.2 oz (105.1 kg)  Height: 5\' 1"  (1.549 m)   Constitutional: pleasant, well developed, well nourished female in NAD Neck: supple, no thyromegaly  MSK: Tenderness with palpation of lumbosacral paraspinal regions bilaterally. No pain with active  flexion, side-bending or rotation of lumbar spine. Pain elicited with extension. Negative straight leg raise bilaterally.  Psych: appropriate mood and affect  Skin: warm and dry; no evidence of skin breakdown or rashes     Assessment & Plan:   See Encounters Tab for problem based charting.  Patient seen with Dr. Eppie Gibson

## 2018-08-20 NOTE — Addendum Note (Signed)
Addended by: Oval Linsey D on: 08/20/2018 11:18 PM   Modules accepted: Level of Service

## 2018-08-30 ENCOUNTER — Telehealth: Payer: Self-pay | Admitting: Internal Medicine

## 2018-08-30 DIAGNOSIS — E119 Type 2 diabetes mellitus without complications: Secondary | ICD-10-CM

## 2018-08-30 NOTE — Telephone Encounter (Signed)
Pt needs prescription for a meter at [facility] on [address]; pt contact [phone]

## 2018-09-02 MED ORDER — ONETOUCH VERIO W/DEVICE KIT: PACK | 0 refills | Status: DC

## 2018-09-02 NOTE — Telephone Encounter (Signed)
Meter kit prescription sent. Diagnosis updated to DM. Thank you.

## 2018-09-02 NOTE — Addendum Note (Signed)
Addended by: Ina Homes T on: 09/02/2018 10:54 AM   Modules accepted: Orders

## 2018-09-02 NOTE — Telephone Encounter (Signed)
Called to find out preferred meter:onetouch verio or ultra 2 are preferred at tier 2 cost. However, her diagnosis is currently prediabetes and she is on diabetes medicine. Diabetes testing supplies are likely not covered without clear diagnosis of diabetes. Will route to Dr. Tarri Abernethy for clarification. I am happy to assist with diabetes education/training as appropriate.  I am happy to enter the meter and supplies as needed.

## 2018-09-03 ENCOUNTER — Other Ambulatory Visit: Payer: Self-pay | Admitting: Internal Medicine

## 2018-09-03 DIAGNOSIS — E119 Type 2 diabetes mellitus without complications: Secondary | ICD-10-CM

## 2018-09-03 NOTE — Telephone Encounter (Signed)
The pharmacy needs rx for test strips and lancets. Thanks

## 2018-09-04 MED ORDER — ONETOUCH ULTRASOFT LANCETS MISC: 12 refills | Status: DC

## 2018-09-04 MED ORDER — GLUCOSE BLOOD VI STRP: ORAL_STRIP | 12 refills | Status: DC

## 2018-09-06 ENCOUNTER — Ambulatory Visit (HOSPITAL_BASED_OUTPATIENT_CLINIC_OR_DEPARTMENT_OTHER): Payer: PRIVATE HEALTH INSURANCE | Attending: Internal Medicine | Admitting: Internal Medicine

## 2018-09-06 VITALS — Ht 61.0 in | Wt 230.0 lb

## 2018-09-06 DIAGNOSIS — G4733 Obstructive sleep apnea (adult) (pediatric): Secondary | ICD-10-CM

## 2018-09-06 DIAGNOSIS — R0902 Hypoxemia: Secondary | ICD-10-CM | POA: Insufficient documentation

## 2018-09-15 DIAGNOSIS — G4733 Obstructive sleep apnea (adult) (pediatric): Secondary | ICD-10-CM

## 2018-09-15 NOTE — Procedures (Signed)
    Patient Name: Crystal English, Crystal English Date: 09/06/2018 Gender: Female D.O.B: [birth year] Age (years): 52 Referring Provider: Gilles Chiquito Height (inches): 18 Interpreting Physician: Baird Lyons MD, ABSM Weight (lbs): 230 RPSGT: Baxter Flattery BMI: 66 MRN: [id number] Neck Size: 15.00  CLINICAL INFORMATION Sleep Study Type: NPSG Indication for sleep study: Obesity, Snoring, Witnesses Apnea / Gasping During Sleep Epworth Sleepiness Score: 3  SLEEP STUDY TECHNIQUE As per the AASM Manual for the Scoring of Sleep and Associated Events v2.3 (April 2016) with a hypopnea requiring 4% desaturations.  The channels recorded and monitored were frontal, central and occipital EEG, electrooculogram (EOG), submentalis EMG (chin), nasal and oral airflow, thoracic and abdominal wall motion, anterior tibialis EMG, snore microphone, electrocardiogram, and pulse oximetry.  MEDICATIONS Medications self-administered by patient taken the night of the study : none reported  SLEEP ARCHITECTURE The study was initiated at 10:23:11 PM and ended at 5:04:05 AM.  Sleep onset time was 5.7 minutes and the sleep efficiency was 85.6%. The total sleep time was 343 minutes.  Stage REM latency was 101.5 minutes.  The patient spent 8.60% of the night in stage N1 sleep, 61.66% in stage N2 sleep, 11.08% in stage N3 and 18.7% in REM.  Alpha intrusion was absent.  Supine sleep was 46.50%.  RESPIRATORY PARAMETERS The overall apnea/hypopnea index (AHI) was 15.2 per hour. There were 69 total apneas, including 69 obstructive, 0 central and 0 mixed apneas. There were 18 hypopneas and 8 RERAs.  The AHI during Stage REM sleep was 43.1 per hour.  AHI while supine was 13.9 per hour.  The mean oxygen saturation was 91.64%. The minimum SpO2 during sleep was 63.00%.  loud snoring was noted during this study.  CARDIAC DATA The 2 lead EKG demonstrated sinus rhythm. The mean heart rate was 77.47 beats per minute.  Other EKG findings include: None. LEG MOVEMENT DATA The total PLMS were 0 with a resulting PLMS index of 0.00. Associated arousal with leg movement index was 0.0 .  IMPRESSIONS - Moderate obstructive sleep apnea occurred during this study (AHI = 15.2/h). - No significant central sleep apnea occurred during this study (CAI = 0.0/h). - Oxygen desaturation was noted during this study (Min O2 = 63.00%). Mean 92%. Time with sat - The patient snored with loud snoring volume. - No cardiac abnormalities were noted during this study. - Clinically significant periodic limb movements did not occur during sleep. No significant associated arousals.  DIAGNOSIS - Obstructive Sleep Apnea (327.23 [G47.33 ICD-10]) - Nocturnal Hypoxemia (327.26 [G47.36 ICD-10])  RECOMMENDATIONS - Suggest CPAP titration sleep study or DME autopap. Other options would be based on clinical judgment. - Be careful with alcohol, sedatives and other CNS depressants that may worsen sleep apnea and disrupt normal sleep architecture. - Sleep hygiene should be reviewed to assess factors that may improve sleep quality. - Weight management and regular exercise should be initiated or continued if appropriate.  [Electronically signed] 09/15/2018 02:00 PM  Baird Lyons MD, [place], American Board of Sleep Medicine   NPI: [id number]                         [place], [place] [facility]  ELECTRONICALLY SIGNED ON:  09/15/2018, 1:57 PM [place] PH: [phone]   FX: [phone] [place]

## 2018-09-18 ENCOUNTER — Other Ambulatory Visit: Payer: Self-pay

## 2018-09-18 ENCOUNTER — Ambulatory Visit (INDEPENDENT_AMBULATORY_CARE_PROVIDER_SITE_OTHER): Payer: PRIVATE HEALTH INSURANCE | Admitting: Internal Medicine

## 2018-09-18 ENCOUNTER — Encounter: Payer: Self-pay | Admitting: Internal Medicine

## 2018-09-18 VITALS — BP 157/106 | HR 79 | Temp 98.2°F | Ht 61.0 in | Wt 235.0 lb

## 2018-09-18 DIAGNOSIS — R110 Nausea: Secondary | ICD-10-CM

## 2018-09-18 DIAGNOSIS — R635 Abnormal weight gain: Secondary | ICD-10-CM

## 2018-09-18 DIAGNOSIS — M797 Fibromyalgia: Secondary | ICD-10-CM

## 2018-09-18 DIAGNOSIS — S76011A Strain of muscle, fascia and tendon of right hip, initial encounter: Secondary | ICD-10-CM | POA: Diagnosis not present

## 2018-09-18 DIAGNOSIS — X58XXXA Exposure to other specified factors, initial encounter: Secondary | ICD-10-CM | POA: Diagnosis not present

## 2018-09-18 DIAGNOSIS — I10 Essential (primary) hypertension: Secondary | ICD-10-CM

## 2018-09-18 DIAGNOSIS — R1013 Epigastric pain: Secondary | ICD-10-CM

## 2018-09-18 DIAGNOSIS — Z79899 Other long term (current) drug therapy: Secondary | ICD-10-CM

## 2018-09-18 DIAGNOSIS — E119 Type 2 diabetes mellitus without complications: Secondary | ICD-10-CM | POA: Diagnosis not present

## 2018-09-18 DIAGNOSIS — Z6841 Body Mass Index (BMI) 40.0 and over, adult: Secondary | ICD-10-CM

## 2018-09-18 DIAGNOSIS — K449 Diaphragmatic hernia without obstruction or gangrene: Secondary | ICD-10-CM

## 2018-09-18 DIAGNOSIS — R52 Pain, unspecified: Secondary | ICD-10-CM

## 2018-09-18 DIAGNOSIS — Z7984 Long term (current) use of oral hypoglycemic drugs: Secondary | ICD-10-CM

## 2018-09-18 LAB — POCT URINE PREGNANCY: Preg Test, Ur: NEGATIVE

## 2018-09-18 LAB — POCT GLYCOSYLATED HEMOGLOBIN (HGB A1C): HEMOGLOBIN A1C: 7.5 % — AB (ref 4.0–5.6)

## 2018-09-18 LAB — GLUCOSE, CAPILLARY: GLUCOSE-CAPILLARY: 146 mg/dL — AB (ref 70–99)

## 2018-09-18 MED ORDER — DULOXETINE HCL 60 MG PO CPEP: 60.0000 mg | ORAL_CAPSULE | Freq: Every day | ORAL | 0 refills | Status: DC

## 2018-09-18 MED ORDER — LISINOPRIL-HYDROCHLOROTHIAZIDE 20-25 MG PO TABS: 1.0000 | ORAL_TABLET | Freq: Every day | ORAL | 0 refills | Status: DC

## 2018-09-18 MED ORDER — METFORMIN HCL 500 MG PO TABS: 500.0000 mg | ORAL_TABLET | Freq: Two times a day (BID) | ORAL | 11 refills | Status: DC

## 2018-09-18 MED ORDER — RANITIDINE HCL 150 MG PO TABS: 150.0000 mg | ORAL_TABLET | Freq: Two times a day (BID) | ORAL | 1 refills | Status: DC

## 2018-09-18 NOTE — Telephone Encounter (Signed)
DULoxetine (CYMBALTA) 60 MG capsule(Expired)  lisinopril-hydrochlorothiazide (PRINZIDE,ZESTORETIC) 20-25 MG tablet  sitaGLIPtin (JANUVIA) 100 MG tablet, REFILL REQUEST @  [place], [place] [phone] (Phone) [phone] (Fax)

## 2018-09-18 NOTE — Patient Instructions (Addendum)
Thank you for allowing [place] to provide your care today. Today we discussed your nausea and groin pain.    I have ordered the following labs for you: urinalysis, hemoglobin a1c    I will call if any are abnormal.    Today we made the following changes to your medications:   Please START taking pantoprazole 20 mg two tablets per day, once in the morning and once in the evening  Please START taking Metformin 500 mg two times per day, once in the morning and once in the evening.   Please STOP taking Januvia (sitagliptin) 100 mg tablet.  For your right groin pain, please alternate ice and heat and rest the area. You may also use Ibuprofen or tylenol as needed for pain.   I have referred you to follow-up with our nutritionist.   Please follow-up in two weeks with your glucometer and glucose readings.   Should you have any questions or concerns please call the internal medicine clinic at [phone].

## 2018-09-18 NOTE — Progress Notes (Signed)
   CC: groin pain  HPI:  Crystal English is a 52 y.o. with PMH as below presenting today with nausea and acute groin pain. Her visit was cut short as she had to leave early for prior appointment.   Please see A&P for assessment of the patient's acute and chronic medical conditions.   Past Medical History:  Diagnosis Date  . Asthma    Required inhaled corticosteroid in past.   . Depression, major 02/25/2013   First episode that required was 2014. Prior episodes that did not require pt to seek medical tx.  . Hypertension    2 drug therapy  . OSA (obstructive sleep apnea)    Sleep study 12/2006 : Moderately Severe OSA. AHI 38.8. O2 sat decreased to 71%.  . Severe obesity (BMI >= 40) (HCC) 10/08/2012   Obesity Class 3. BMI > 40.   . Tobacco abuse    Review of Systems:   Review of Systems  Constitutional: Negative for chills, fever and weight loss (weight gain).  Eyes: Negative for blurred vision.  Cardiovascular: Negative for chest pain and leg swelling.  Gastrointestinal: Positive for nausea. Negative for abdominal pain, heartburn and vomiting.  Genitourinary: Negative for dysuria and urgency (episode of incontinence).  Endo/Heme/Allergies: Negative for polydipsia.    Physical Exam:  Constitution: NAD, obese Abdominal: +BS, soft, NTTP, non-distended MSK: no pain with passive extension, flexion, external and internal rotation; pain with active hip flexion only; no palpable hernia Skin: c/d/i, no edema   Vitals:   09/18/18 1432  BP: (!) 157/106  Pulse: 79  Temp: 98.2 F (36.8 C)  TempSrc: Oral  SpO2: 95%  Weight: 235 lb (106.6 kg)  Height: 5\' 1"  (1.549 m)     Assessment & Plan:   See Encounters Tab for problem based charting.  Patient seen with Dr. Daryll Drown

## 2018-09-19 ENCOUNTER — Other Ambulatory Visit: Payer: Self-pay | Admitting: Internal Medicine

## 2018-09-19 ENCOUNTER — Encounter: Payer: Self-pay | Admitting: Internal Medicine

## 2018-09-19 DIAGNOSIS — S76011A Strain of muscle, fascia and tendon of right hip, initial encounter: Secondary | ICD-10-CM

## 2018-09-19 DIAGNOSIS — R110 Nausea: Secondary | ICD-10-CM

## 2018-09-19 DIAGNOSIS — R1013 Epigastric pain: Secondary | ICD-10-CM | POA: Insufficient documentation

## 2018-09-19 HISTORY — DX: Strain of muscle, fascia and tendon of right hip, initial encounter: S76.011A

## 2018-09-19 HISTORY — DX: Nausea: R11.0

## 2018-09-19 LAB — URINALYSIS, ROUTINE W REFLEX MICROSCOPIC
BILIRUBIN UA: NEGATIVE
Ketones, UA: NEGATIVE
LEUKOCYTES UA: NEGATIVE
Nitrite, UA: NEGATIVE
PH UA: 6 (ref 5.0–7.5)
PROTEIN UA: NEGATIVE
RBC UA: NEGATIVE
Specific Gravity, UA: 1.017 (ref 1.005–1.030)
Urobilinogen, Ur: 0.2 mg/dL (ref 0.2–1.0)

## 2018-09-19 MED ORDER — SUMATRIPTAN SUCCINATE 100 MG PO TABS: 100.0000 mg | ORAL_TABLET | Freq: Once | ORAL | 3 refills | Status: DC | PRN

## 2018-09-19 NOTE — Assessment & Plan Note (Signed)
BP Readings from Last 3 Encounters:  09/18/18 (!) 157/106  08/20/18 (!) 154/97  05/31/18 (!) 131/95   She states her grandchild knocked over her BP medication behind her dresser, and she has not taken them for the past several days.   - refilled lisinopril-HCTZ 20-25 mg  - BP check at f/u

## 2018-09-19 NOTE — Assessment & Plan Note (Signed)
States she has had nausea for the past three weeks early in the morning. It usually gets better after she eats. Denies vomiting or symptoms of reflux, burning in the epigastric region. Previously given pantoprazole, which she states was for a hiatal hernia that occasionally gives her issues. She states this does not feel like reflux. LMP two years ago, no changes in urination or bowel movement. She did stop taking her Januvia as she has been unable to afford it and has not been on any medication for her TIIDM for the past month. She was previously prescribed Ibuprofen 800 mg for MSK pain but states she has not been using this at all. I believe this is likely symptoms of dyspepsia or recurrence of her GERD. Symptoms due to hiatal hernia also possible as symptoms resolve after meal. Pantoprazole was last prescribed 2018.   - UA - urine beta hCG - ranitidine 150 mg bid  - f/u two weeks - consider referral for EGD if symptoms persist

## 2018-09-19 NOTE — Assessment & Plan Note (Signed)
Left groin pain started about three weeks ago. She states the pain is worse when she moves around. She did do a bootcamp workout and symptoms started shortly after this. No saddle anesthesia, radiation, numbness or tingling. She did have one episode of incontinence as she stated trying to hold her urine made the area hurt more. No swelling or dysuria or other changes in urination. No hernia palpated with valsalva or TTP of the area. No pain with passive ROM. Pain near hip flexor area with active flexion.   - advised to rest for several days, alternate ice & heat to the area - use previously prescribed Ibuprofen for pain but limit use and d/c if increased dyspepsia symptoms  - consider referral to urology or gyn for pelvic floor dysfunction if symptoms continue

## 2018-09-19 NOTE — Assessment & Plan Note (Signed)
She has not had her Januvia in one month due to affordability. Glucose today 146 and Hgba1c 7.5, which is unchanged from previous on 5/23. No polydipsia or polyuria. She previously discussed trying metformin with her PCP and is open to switching to this medication. She states she is also planning to buy a glucometer tomorrow and would like to meet with Butch Penny to discuss nutrition options and her diabetes as she has been gaining weight and would like to work on being healthier.   - metformin 500 mg bid, stop Januvia - refer to RD

## 2018-09-25 NOTE — Progress Notes (Signed)
Internal Medicine Clinic Attending  I saw and evaluated the patient.  I personally confirmed the key portions of the history and exam documented by Dr. Seawell and I reviewed pertinent patient test results.  The assessment, diagnosis, and plan were formulated together and I agree with the documentation in the resident's note.     

## 2018-10-04 ENCOUNTER — Encounter: Payer: Self-pay | Admitting: Dietician

## 2018-10-04 ENCOUNTER — Ambulatory Visit (INDEPENDENT_AMBULATORY_CARE_PROVIDER_SITE_OTHER): Payer: PRIVATE HEALTH INSURANCE | Admitting: Dietician

## 2018-10-04 DIAGNOSIS — E119 Type 2 diabetes mellitus without complications: Secondary | ICD-10-CM | POA: Diagnosis not present

## 2018-10-04 DIAGNOSIS — Z6841 Body Mass Index (BMI) 40.0 and over, adult: Secondary | ICD-10-CM

## 2018-10-04 DIAGNOSIS — Z7984 Long term (current) use of oral hypoglycemic drugs: Secondary | ICD-10-CM | POA: Diagnosis not present

## 2018-10-04 DIAGNOSIS — Z713 Dietary counseling and surveillance: Secondary | ICD-10-CM | POA: Diagnosis not present

## 2018-10-04 LAB — GLUCOSE, CAPILLARY: Glucose-Capillary: 103 mg/dL — ABNORMAL HIGH (ref 70–99)

## 2018-10-04 NOTE — Progress Notes (Signed)
  Medical Nutrition Therapy:  Appt start time: 2330 end time:  1615. Visit # 1  Assessment:  Primary concerns today: managing her newly diagnosed diabetes. Patient arrived with 53 yr old granddaughter today to learn more about how to care for her diabetes. She understands that diet and exercise are important. 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    Preferred Learning Style: No preference indicated  Learning Readiness: Contemplating  Estimated body mass index is 44.4 kg/m as calculated from the following:   Height as of 09/18/18: 5\' 1"  (1.549 m).   Weight as of 09/18/18: 235 lb (106.6 kg).  Wt Readings from Last 5 Encounters:  09/18/18 235 lb (106.6 kg)  09/06/18 230 lb (104.3 kg)  08/20/18 231 lb 11.2 oz (105.1 kg)  05/31/18 226 lb 1.6 oz (102.6 kg)  05/27/18 225 lb 11.2 oz (102.4 kg)   SLEEP: need to assess at future visit MEDICATIONS: metformin (500 mg 2xday) She says that she has been experiencing nausea, so she is skipping it sometimes. BLOOD SUGAR: 103 mg/dl (6 hrs after eating) Lab Results  Component Value Date   HGBA1C 7.5 (A) 09/18/2018   DIETARY INTAKE: Usual eating pattern includes coffee with 1/3 cup creamer for breakfast; fast food for lunch; bowl of frosted flakes for dinner Dining Out (times/week): "a lot"  Usual physical activity: none currently due to pain, but actively looking into gym membership.   Progress Towards Goal(s):  In progress.   Nutritional Diagnosis:  NB-1.4 Self-monitoring deficit As related to no previous knowledge or education on checking blood sugar around mealtimes.  As evidenced by her report .    Intervention:  Nutrition Education. Action Goal: Measure her blood sugar before and 2 hr after mealtimes   Outcome goal: To gain understanding of how different foods impact her blood sugar level, so that she can apply that information in future choices with the long term goal of lowering her a1c.  Teaching Method  Utilized: Visual, Auditory Handouts given during visit include: Know your paired testing booklet Barriers to learning/adherence to lifestyle change: need to assess at future visit  Demonstrated degree of understanding via:  Teach Back   Monitoring/Evaluation: Dietary intake, exercise, blood sugar meter in 3 week(s).  Debera Lat, RD 10/04/2018 4:39 PM.

## 2018-10-04 NOTE — Patient Instructions (Signed)
Goal #1- check around meals( cereal) follow what is in the book  Big goal- COME OFF OF DIABETES MEDICINE  goal blood sugars to get there  Less than 110 before meals  Less than 140 2 hours after-    Butch Penny [phone]

## 2018-10-07 ENCOUNTER — Telehealth: Payer: Self-pay | Admitting: *Deleted

## 2018-10-07 NOTE — Telephone Encounter (Signed)
Pt calls asking about sleep study, please call her at [phone].

## 2018-10-10 ENCOUNTER — Encounter: Payer: Self-pay | Admitting: Internal Medicine

## 2018-10-10 ENCOUNTER — Ambulatory Visit (INDEPENDENT_AMBULATORY_CARE_PROVIDER_SITE_OTHER): Payer: PRIVATE HEALTH INSURANCE | Admitting: Internal Medicine

## 2018-10-10 ENCOUNTER — Other Ambulatory Visit: Payer: Self-pay

## 2018-10-10 VITALS — BP 142/93 | HR 84 | Temp 98.6°F | Wt 234.7 lb

## 2018-10-10 DIAGNOSIS — N3941 Urge incontinence: Secondary | ICD-10-CM | POA: Diagnosis not present

## 2018-10-10 DIAGNOSIS — R7303 Prediabetes: Secondary | ICD-10-CM

## 2018-10-10 DIAGNOSIS — G589 Mononeuropathy, unspecified: Secondary | ICD-10-CM | POA: Diagnosis not present

## 2018-10-10 DIAGNOSIS — G4733 Obstructive sleep apnea (adult) (pediatric): Secondary | ICD-10-CM | POA: Diagnosis not present

## 2018-10-10 DIAGNOSIS — Z7984 Long term (current) use of oral hypoglycemic drugs: Secondary | ICD-10-CM

## 2018-10-10 DIAGNOSIS — E119 Type 2 diabetes mellitus without complications: Secondary | ICD-10-CM

## 2018-10-10 NOTE — Telephone Encounter (Signed)
Requesting sleep study results. Please call pt back.  

## 2018-10-10 NOTE — Patient Instructions (Signed)
Thank you for allowing [place] to provide your care. I think your pain is related to a nerve that is entrapped. We will watch it but if it gets worse please let me know and we may need to do some more tests. In regards to your urge incontinence the most important step is bladder training. I will also refer you to OB/GYN.   Kegel Exercises Kegel exercises help strengthen the muscles that support the rectum, vagina, small intestine, bladder, and uterus. Doing Kegel exercises can help:  Improve bladder and bowel control.  Improve sexual response.  Reduce problems and discomfort during pregnancy.  Kegel exercises involve squeezing your pelvic floor muscles, which are the same muscles you squeeze when you try to stop the flow of urine. The exercises can be done while sitting, standing, or lying down, but it is best to vary your position. Phase 1 exercises 1. Squeeze your pelvic floor muscles tight. You should feel a tight lift in your rectal area. If you are a female, you should also feel a tightness in your vaginal area. Keep your stomach, buttocks, and legs relaxed. 2. Hold the muscles tight for up to 10 seconds. 3. Relax your muscles. Repeat this exercise 50 times a day or as many times as told by your health care provider. Continue to do this exercise for at least 4-6 weeks or for as long as told by your health care provider. This information is not intended to replace advice given to you by your health care provider. Make sure you discuss any questions you have with your health care provider. Document Released: 10/23/2012 Document Revised: 07/01/2016 Document Reviewed: 09/26/2015 Elsevier Interactive Patient Education  [employer].

## 2018-10-10 NOTE — Progress Notes (Signed)
   CC: LLQ abdominal pain  HPI:  [name] is a 52 y.o. who presented to the clinic for acute LLQ abdominal pain and to discuss her recent sleep study results. For a detailed evaluation and management plan please refer to problem based charting.   Past Medical History:  Diagnosis Date  . Asthma    Required inhaled corticosteroid in past.   . Depression, major 02/25/2013   First episode that required was 2014. Prior episodes that did not require pt to seek medical tx.  . Hypertension    2 drug therapy  . Nausea 09/19/2018  . OSA (obstructive sleep apnea)    Sleep study 12/2006 : Moderately Severe OSA. AHI 38.8. O2 sat decreased to 71%.  . Severe obesity (BMI >= 40) (HCC) 10/08/2012   Obesity Class 3. BMI > 40.   . Tobacco abuse    Review of Systems:  12 point ROS preformed. All negative aside from those mentioned in the HPI.  Physical Exam: Vitals:   10/10/18 1554  BP: (!) 142/93  Pulse: 84  Temp: 98.6 F (37 C)  TempSrc: Oral  SpO2: 95%  Weight: 234 lb 11.2 oz (106.5 kg)   General: Obese female in no acute distress Pulm: Good air movement with no wheezing or crackles  CV: RRR, no murmurs, no rubs  Abdomen: Active bowel sounds, soft, non-distended, no tenderness to palpation, + Carnett's sign   Extremities: - window wiper test, no tenderness to abduction or adduction of the left or right hip.   Assessment & Plan:   See Encounters Tab for problem based charting.  Patient discussed with Dr. Evette Doffing

## 2018-10-12 DIAGNOSIS — N3941 Urge incontinence: Secondary | ICD-10-CM | POA: Insufficient documentation

## 2018-10-12 DIAGNOSIS — G589 Mononeuropathy, unspecified: Secondary | ICD-10-CM | POA: Insufficient documentation

## 2018-10-12 NOTE — Assessment & Plan Note (Signed)
Patient complains of signs and symptoms of urge incontinence. Tells me that she will be struck with an urge to urinate and typically cannot make it to the bathroom. She is contemplated starting to wear adult depends. We discussed that bladder training is typically the first step in the treatment average incontinence. If this fails to work we can always consider an anti-muscarinic. Also discussed referral to OB/GYN to assess for structural causes given her birth history.

## 2018-10-12 NOTE — Assessment & Plan Note (Signed)
Patient with borderline control diabetes with a recent A1c of 7.5. She was previously started on Januvia; however, due to cost she was switched over to metformin. She states that she experienced some nausea/vomiting and diarrhea with the metformin and so she has since gotten back to the 500 mg once daily. With this dose she is not having any further nausea/vomiting or diarrhea. She recently joined weight loss group. She is changing her diet and going to try intermittent fasting. We discussed possible medication changes; however, she would like to wait and see how she does with her dietary and lifestyle changes. She will follow-up in three months and at that time if her A1c remains above goal, we will discuss starting a medication date and weight loss such as a GLP-1, SLGT2, or DDP4 inhibitor.

## 2018-10-12 NOTE — Assessment & Plan Note (Signed)
Patient presented with the chief complaint of left lower quadrant abdominal pain. Upon further questioning it appears that her pain is more in the left inguinal fold. She describes the pain as a burning sensation that waxes and wanes. This is been going on for a couple of months; however, is not affecting her quality of life. She states that the episodes never last more than a couple hours and do not occur daily. She is not found taking over-the-counter medications that help with the pain. She denies change in her gait. Denies any bulges or worsening of the pain with increased abdominal pressure. Denies any hematuria, dysuria, or polyuria. It does describe what sounds like urge incontinence. She denies constipation. No new rash is, fevers, myalgias, arthralgias. Her physical exam is fairly unremarkable aside from a questionably positive Carnett's sign. Her exam is not consistent with visceral, a hernia, or hip pathology.   Discussed that the most likely diagnosis at this point is a nerve entrapment syndrome. Given that is not significantly affecting her quality of life we will do watchful waiting and see if the pain increases in frequency or severity.

## 2018-10-12 NOTE — Assessment & Plan Note (Signed)
Patient presented to the clinic to discuss her recent sleep study. This illustrated moderate obstructive sleep apnea with a AHI = 15.2/h. She is interested in pursuing a CPAP machine. I will order a CPAP titration study.

## 2018-10-13 ENCOUNTER — Encounter: Payer: Self-pay | Admitting: Internal Medicine

## 2018-10-14 NOTE — Progress Notes (Signed)
Internal Medicine Clinic Attending  Case discussed with Dr. Helberg at the time of the visit.  We reviewed the resident's history and exam and pertinent patient test results.  I agree with the assessment, diagnosis, and plan of care documented in the resident's note.    

## 2018-10-15 NOTE — Telephone Encounter (Signed)
Results discussed at [facility] with PCP 10/10/2018. Hubbard Hartshorn, RN, BSN

## 2018-10-25 ENCOUNTER — Encounter (HOSPITAL_BASED_OUTPATIENT_CLINIC_OR_DEPARTMENT_OTHER): Payer: PRIVATE HEALTH INSURANCE

## 2018-10-25 ENCOUNTER — Ambulatory Visit: Payer: PRIVATE HEALTH INSURANCE | Admitting: Dietician

## 2018-10-29 ENCOUNTER — Telehealth: Payer: Self-pay | Admitting: Internal Medicine

## 2018-10-29 DIAGNOSIS — N3941 Urge incontinence: Secondary | ICD-10-CM

## 2018-10-29 NOTE — Telephone Encounter (Signed)
Pt seen on 10/10/2018 awaiting GYN Referral.  Please Advise if a Referral was to be placed patient asking for this referral as soon as possible.

## 2018-10-29 NOTE — Telephone Encounter (Signed)
Referral sent. Thank you!

## 2018-10-30 ENCOUNTER — Telehealth: Payer: Self-pay | Admitting: *Deleted

## 2018-10-30 NOTE — Telephone Encounter (Signed)
CALLED PATIENT LVM X2 REGARDING HER GYN REFERRAL AS TO WHERE SHE WOULD LIKE HER REFERRAL TO BE SENT.

## 2018-10-31 ENCOUNTER — Ambulatory Visit: Payer: PRIVATE HEALTH INSURANCE | Admitting: Dietician

## 2018-11-09 ENCOUNTER — Ambulatory Visit (HOSPITAL_BASED_OUTPATIENT_CLINIC_OR_DEPARTMENT_OTHER): Payer: PRIVATE HEALTH INSURANCE | Attending: Internal Medicine

## 2018-11-17 ENCOUNTER — Other Ambulatory Visit: Payer: Self-pay | Admitting: Internal Medicine

## 2018-11-17 DIAGNOSIS — M5010 Cervical disc disorder with radiculopathy, unspecified cervical region: Secondary | ICD-10-CM

## 2018-11-21 ENCOUNTER — Other Ambulatory Visit: Payer: Self-pay

## 2018-11-21 DIAGNOSIS — M5010 Cervical disc disorder with radiculopathy, unspecified cervical region: Secondary | ICD-10-CM

## 2018-11-21 NOTE — Telephone Encounter (Signed)
ibuprofen (ADVIL,MOTRIN) 800 MG tablet  Refill request [address] [place], [place] [phone] (Phone) [phone] (Fax)

## 2018-11-25 NOTE — Telephone Encounter (Signed)
Pt has an appt tomorrow in [facility]. 

## 2018-11-26 ENCOUNTER — Ambulatory Visit (INDEPENDENT_AMBULATORY_CARE_PROVIDER_SITE_OTHER): Payer: PRIVATE HEALTH INSURANCE | Admitting: Internal Medicine

## 2018-11-26 ENCOUNTER — Other Ambulatory Visit: Payer: Self-pay

## 2018-11-26 VITALS — BP 132/90 | HR 90 | Temp 98.3°F | Ht 61.0 in | Wt 233.2 lb

## 2018-11-26 DIAGNOSIS — Z9111 Patient's noncompliance with dietary regimen: Secondary | ICD-10-CM | POA: Diagnosis not present

## 2018-11-26 DIAGNOSIS — M5010 Cervical disc disorder with radiculopathy, unspecified cervical region: Secondary | ICD-10-CM | POA: Diagnosis not present

## 2018-11-26 DIAGNOSIS — Z7984 Long term (current) use of oral hypoglycemic drugs: Secondary | ICD-10-CM

## 2018-11-26 DIAGNOSIS — E1169 Type 2 diabetes mellitus with other specified complication: Secondary | ICD-10-CM

## 2018-11-26 DIAGNOSIS — E119 Type 2 diabetes mellitus without complications: Secondary | ICD-10-CM

## 2018-11-26 DIAGNOSIS — Z791 Long term (current) use of non-steroidal anti-inflammatories (NSAID): Secondary | ICD-10-CM

## 2018-11-26 MED ORDER — IBUPROFEN 800 MG PO TABS: 800.0000 mg | ORAL_TABLET | Freq: Three times a day (TID) | ORAL | 0 refills | Status: DC | PRN

## 2018-11-26 MED ORDER — SITAGLIPTIN PHOSPHATE 50 MG PO TABS: 50.0000 mg | ORAL_TABLET | Freq: Every day | ORAL | 1 refills | Status: DC

## 2018-11-26 NOTE — Patient Instructions (Signed)
FOLLOW-UP INSTRUCTIONS When: One month to see your doctor, Dr. Tarri Abernethy For: a routine visit and further diabetes managment What to bring: All of your medications   I have resumed your Januvia. If this is again cost prohibitive despite the new year and possible changes to your insurance please notify us and we will attempt to provide a more affordable option of similar efficacy.  With regard to the nausea, please continue to monitor what you eat, when you eat it, and closely monitor for other symptoms. Please let us know if anything else arises.   Thank you for your visit to the [facility] today. If you have any questions or concerns please call us at [phone].

## 2018-11-26 NOTE — Progress Notes (Signed)
   CC: Nausea and headache  HPI:[name] is a 53 y.o. female who presents for evaluation of fatigue. Please see individual problem based A/P for details.  Past Medical History:  Diagnosis Date  . Asthma    Required inhaled corticosteroid in past.   . Depression, major 02/25/2013   First episode that required was 2014. Prior episodes that did not require pt to seek medical tx.  . Hypertension    2 drug therapy  . Nausea 09/19/2018  . OSA (obstructive sleep apnea)    Sleep study 12/2006 : Moderately Severe OSA. AHI 38.8. O2 sat decreased to 71%.  . Severe obesity (BMI >= 40) (HCC) 10/08/2012   Obesity Class 3. BMI > 40.   . Tobacco abuse    Review of Systems:  ROS negative  Physical Exam: Vitals:   11/26/18 1541  BP: 132/90  Pulse: 90  Temp: 98.3 F (36.8 C)  TempSrc: Oral  SpO2: 90%  Weight: 233 lb 3.2 oz (105.8 kg)  Height: 5\' 1"  (1.549 m)   General: A/O x4, in no acute distress, afebrile, nondiaphoretic HEENT: CNII-XII grossly intact Cardio: RRR, no mrg's Pulmonary: CTA bilaterally ABD: nontender, to palpation, bowel sounds normoactive MSK: Nontender, nonedematous Neuro: Strength roughly 5/5 upper and lower bilaterally, absent gait abnormalities  Assessment & Plan:   See Encounters Tab for problem based charting.  Patient discussed with Dr. Evette Doffing

## 2018-11-27 ENCOUNTER — Telehealth: Payer: Self-pay

## 2018-11-27 NOTE — Assessment & Plan Note (Signed)
DMII: The patient presents today for evaluation of her headaches and mild nausea that have persisted. She initially felt that her symptoms were due to the Metformin as the nausea began around the same time as initiating the metformin. However, despite initially decreasing her dose and eventually cessation of use of the metformin altogether she continues to experience nausea with secondary headaches. They are most notable in the morning while preparing for work but are otherwise random without clear triggers and do occur in multiple settings. She has felt the need to vomit on occasion but has never progressed that far. She denied vomiting, visual field deficits, leg/arm weakness, numbness, falls, passing out or nearly passing out, dizziness, lightheadedness, severe headache, abdominal pain, constipation, diarrhea, early satiety, reflux symptoms, abd bloating, difficulty swallowing, muscle aches, fever, chills, night sweats, foot pain. She endorses polyuria but denied polydipsia or dysuria.  I feel her symptoms are related to her diabetes or more specifically her lack of glucose transport and dehydration. I also fell that the lack of exercise and dietary guideline adherence is playing a role. I am unable to identify a focal neurologic deficit or concerning systemic feature related to her headache to warrant further evaluation of this as a primary intracranial process at this time. We will proceed with advice for increasing hydration, increasing activity, better adherence to a diet low in high glycemic foods and adherence to metformin and Januvia.   Plan: Advised verbally to increase water intake and to avoid soda/sweet tea. Resume Metformin 500mg  daily Januvia 50mg  daily, patient stated that she can afford this now Return in one month to see her PCP for follow-up or sooner if symptoms worsen or fail to improve

## 2018-11-27 NOTE — Telephone Encounter (Signed)
Pt would like a letter for work stating she had appt with [place] on 11/26/18. Please fax the letter to [phone].

## 2018-11-27 NOTE — Assessment & Plan Note (Signed)
Refilled Ibuprofen. Patient has with her the last bottle refilled >6 months prior for a total of 30 tablets. With this intermittent use, I am less concerned for a renal injury.

## 2018-11-27 NOTE — Progress Notes (Signed)
Internal Medicine Clinic Attending  Case discussed with Dr. Harbrecht at the time of the visit.  We reviewed the resident's history and exam and pertinent patient test results.  I agree with the assessment, diagnosis, and plan of care documented in the resident's note.   

## 2018-12-03 ENCOUNTER — Encounter: Payer: Self-pay | Admitting: Internal Medicine

## 2018-12-04 ENCOUNTER — Encounter: Payer: Self-pay | Admitting: Obstetrics & Gynecology

## 2018-12-19 ENCOUNTER — Other Ambulatory Visit: Payer: Self-pay | Admitting: Internal Medicine

## 2018-12-19 ENCOUNTER — Encounter: Payer: PRIVATE HEALTH INSURANCE | Admitting: Obstetrics & Gynecology

## 2018-12-19 DIAGNOSIS — Z1231 Encounter for screening mammogram for malignant neoplasm of breast: Secondary | ICD-10-CM

## 2018-12-26 ENCOUNTER — Other Ambulatory Visit: Payer: Self-pay

## 2018-12-26 ENCOUNTER — Encounter: Payer: Self-pay | Admitting: Internal Medicine

## 2018-12-26 ENCOUNTER — Ambulatory Visit (INDEPENDENT_AMBULATORY_CARE_PROVIDER_SITE_OTHER): Payer: PRIVATE HEALTH INSURANCE | Admitting: Internal Medicine

## 2018-12-26 VITALS — BP 129/90 | HR 73 | Temp 98.5°F | Ht 61.0 in | Wt 231.7 lb

## 2018-12-26 DIAGNOSIS — R51 Headache: Secondary | ICD-10-CM | POA: Diagnosis not present

## 2018-12-26 DIAGNOSIS — I10 Essential (primary) hypertension: Secondary | ICD-10-CM | POA: Diagnosis not present

## 2018-12-26 DIAGNOSIS — G4733 Obstructive sleep apnea (adult) (pediatric): Secondary | ICD-10-CM

## 2018-12-26 DIAGNOSIS — E119 Type 2 diabetes mellitus without complications: Secondary | ICD-10-CM | POA: Diagnosis not present

## 2018-12-26 DIAGNOSIS — Z6841 Body Mass Index (BMI) 40.0 and over, adult: Secondary | ICD-10-CM

## 2018-12-26 LAB — POCT GLYCOSYLATED HEMOGLOBIN (HGB A1C): Hemoglobin A1C: 7.7 % — AB (ref 4.0–5.6)

## 2018-12-26 LAB — GLUCOSE, CAPILLARY: Glucose-Capillary: 113 mg/dL — ABNORMAL HIGH (ref 70–99)

## 2018-12-26 MED ORDER — SITAGLIPTIN PHOSPHATE 100 MG PO TABS: 100.0000 mg | ORAL_TABLET | Freq: Every day | ORAL | 3 refills | Status: DC

## 2018-12-26 NOTE — Patient Instructions (Signed)
Thank you for allowing me to provide your care. We are increasing your Sitagliptin today. Please take this as prescribed. Continue to work on increasing your exercise and weight lose.   I this your headaches are likely related to your sleep apnea. Please call the sleep center and get in for a titration study so we can get you a CPAP.  I will see you back in 3 months or sooner if anything arises.

## 2018-12-26 NOTE — Progress Notes (Signed)
   CC: F/U DM, HTN, and OSA  HPI:  [name] is a 53 y.o. female with PMHx listed below presenting for F/U DM, HTN, and OSA. Please see the A&P for the status of the patient's chronic medical problems.  Past Medical History:  Diagnosis Date  . Asthma    Required inhaled corticosteroid in past.   . Depression, major 02/25/2013   First episode that required was 2014. Prior episodes that did not require pt to seek medical tx.  . Hypertension    2 drug therapy  . Nausea 09/19/2018  . OSA (obstructive sleep apnea)    Sleep study 12/2006 : Moderately Severe OSA. AHI 38.8. O2 sat decreased to 71%.  . Severe obesity (BMI >= 40) (HCC) 10/08/2012   Obesity Class 3. BMI > 40.   . Tobacco abuse    Review of Systems: Performed and all others negative.  Physical Exam: Vitals:   12/26/18 1409  BP: 129/90  Pulse: 73  Temp: 98.5 F (36.9 C)  TempSrc: Oral  SpO2: 99%  Weight: 231 lb 11.2 oz (105.1 kg)  Height: 5\' 1"  (1.549 m)   General: Obese female in no acute distress Pulm: Good air movement with no wheezing or crackles  CV: RRR, no murmurs, no rubs   Assessment & Plan:   See Encounters Tab for problem based charting.  Patient discussed with Dr. Dareen Piano

## 2018-12-27 ENCOUNTER — Encounter: Payer: Self-pay | Admitting: Internal Medicine

## 2018-12-27 LAB — BMP8+ANION GAP
Anion Gap: 18 mmol/L (ref 10.0–18.0)
BUN/Creatinine Ratio: 9 (ref 9–23)
BUN: 8 mg/dL (ref 6–24)
CO2: 26 mmol/L (ref 20–29)
Calcium: 9.8 mg/dL (ref 8.7–10.2)
Chloride: 99 mmol/L (ref 96–106)
Creatinine, Ser: 0.93 mg/dL (ref 0.57–1.00)
GFR calc Af Amer: 81 mL/min/{1.73_m2} (ref >59)
GFR calc non Af Amer: 70 mL/min/{1.73_m2} (ref >59)
GLUCOSE: 117 mg/dL — AB (ref 65–99)
Potassium: 4.8 mmol/L (ref 3.5–5.2)
Sodium: 143 mmol/L (ref 134–144)

## 2018-12-27 NOTE — Assessment & Plan Note (Signed)
HPI: Patient with known OSA. She has not been in for a CPAP titration study. She continues to have daily headaches and tiredness.   A/P: Discussed the benefits of CPAP and encouraged her to call the sleep lab to get schedule for a titration study. Order previously placed.

## 2018-12-27 NOTE — Assessment & Plan Note (Signed)
HPI: Patient with uncontrolled DM. Previously on metformin but unable to tolerate it due to side effects so she stopped taking it. She is only on sitagliptin. She has not started exercising and not changed her diet. Weight is slightly increased.   A/P: Patient with uncontrolled DM. A1c goal for her is <7.0 and she is currently 7.7. Discussed adding addition medication but she is reluctant. We will increase her Sitagliptin to 100 mg QD. Encouraged lifestyle modification.

## 2018-12-27 NOTE — Progress Notes (Signed)
Internal Medicine Clinic Attending  Case discussed with Dr. Helberg at the time of the visit.  We reviewed the resident's history and exam and pertinent patient test results.  I agree with the assessment, diagnosis, and plan of care documented in the resident's note.    

## 2018-12-27 NOTE — Assessment & Plan Note (Addendum)
HPI: Patient with HTN. She is currently on lisinopril 20 mg and HCTZ 25 mg QD. She is tolerating the medications without any adverse effects. No issues obtaining the medications.   A/P: HTN is well controlled. Continue lisinopril 20 mg and HCTZ 25 mg QD. BMP with stable renal function and potassium.

## 2018-12-30 ENCOUNTER — Other Ambulatory Visit: Payer: Self-pay

## 2018-12-30 DIAGNOSIS — J069 Acute upper respiratory infection, unspecified: Secondary | ICD-10-CM

## 2018-12-30 DIAGNOSIS — M5010 Cervical disc disorder with radiculopathy, unspecified cervical region: Secondary | ICD-10-CM

## 2018-12-30 DIAGNOSIS — R0789 Other chest pain: Secondary | ICD-10-CM

## 2018-12-30 DIAGNOSIS — M797 Fibromyalgia: Secondary | ICD-10-CM

## 2018-12-30 DIAGNOSIS — E119 Type 2 diabetes mellitus without complications: Secondary | ICD-10-CM

## 2018-12-30 DIAGNOSIS — I10 Essential (primary) hypertension: Secondary | ICD-10-CM

## 2018-12-30 DIAGNOSIS — R1013 Epigastric pain: Secondary | ICD-10-CM

## 2018-12-30 NOTE — Telephone Encounter (Signed)
Requesting all meds to be transferred to [place], [place], [place] [id number].

## 2018-12-31 ENCOUNTER — Other Ambulatory Visit: Payer: Self-pay | Admitting: Internal Medicine

## 2018-12-31 DIAGNOSIS — R1013 Epigastric pain: Secondary | ICD-10-CM

## 2018-12-31 MED ORDER — LISINOPRIL-HYDROCHLOROTHIAZIDE 20-25 MG PO TABS: 1.0000 | ORAL_TABLET | Freq: Every day | ORAL | 3 refills | Status: DC

## 2018-12-31 MED ORDER — SUMATRIPTAN SUCCINATE 100 MG PO TABS: 100.0000 mg | ORAL_TABLET | Freq: Once | ORAL | 3 refills | Status: DC | PRN

## 2018-12-31 MED ORDER — DULOXETINE HCL 60 MG PO CPEP: 60.0000 mg | ORAL_CAPSULE | Freq: Every day | ORAL | 3 refills | Status: DC

## 2018-12-31 MED ORDER — ONETOUCH ULTRASOFT LANCETS MISC: 12 refills | Status: AC

## 2018-12-31 MED ORDER — GLUCOSE BLOOD VI STRP: ORAL_STRIP | 12 refills | Status: DC

## 2018-12-31 MED ORDER — IBUPROFEN 800 MG PO TABS: 800.0000 mg | ORAL_TABLET | Freq: Three times a day (TID) | ORAL | 0 refills | Status: DC | PRN

## 2018-12-31 MED ORDER — SITAGLIPTIN PHOSPHATE 100 MG PO TABS: 100.0000 mg | ORAL_TABLET | Freq: Every day | ORAL | 3 refills | Status: DC

## 2018-12-31 MED ORDER — FAMOTIDINE 20 MG PO TABS: 20.0000 mg | ORAL_TABLET | Freq: Two times a day (BID) | ORAL | 3 refills | Status: DC

## 2018-12-31 NOTE — Progress Notes (Signed)
Rantidine switched to famotidine due to recall.

## 2019-01-16 ENCOUNTER — Ambulatory Visit
Admission: RE | Admit: 2019-01-16 | Discharge: 2019-01-16 | Disposition: A | Discharge status: Home / Self Care | Payer: PRIVATE HEALTH INSURANCE | Source: Ambulatory Visit | Attending: Internal Medicine | Admitting: Internal Medicine

## 2019-01-16 DIAGNOSIS — Z1231 Encounter for screening mammogram for malignant neoplasm of breast: Secondary | ICD-10-CM

## 2019-02-19 ENCOUNTER — Encounter (HOSPITAL_BASED_OUTPATIENT_CLINIC_OR_DEPARTMENT_OTHER): Payer: PRIVATE HEALTH INSURANCE

## 2019-03-20 ENCOUNTER — Ambulatory Visit (INDEPENDENT_AMBULATORY_CARE_PROVIDER_SITE_OTHER): Payer: PRIVATE HEALTH INSURANCE | Admitting: Internal Medicine

## 2019-03-20 ENCOUNTER — Other Ambulatory Visit: Payer: Self-pay

## 2019-03-20 DIAGNOSIS — E119 Type 2 diabetes mellitus without complications: Secondary | ICD-10-CM

## 2019-03-20 DIAGNOSIS — R5383 Other fatigue: Secondary | ICD-10-CM

## 2019-03-20 DIAGNOSIS — G4733 Obstructive sleep apnea (adult) (pediatric): Secondary | ICD-10-CM

## 2019-03-20 NOTE — Assessment & Plan Note (Signed)
HPI:  Patient with uncontrolled type II diabetes. She is currently on sitagliptin 100 mg daily. Since working at home during the recent COVID-19 pandemic she started eating more junk food. She reports her CBG's are consistently in the 2 to 300 range. She endorses polyuria and polydipsia. She has been trying to exercise more. She is reluctant to start any new medications.  A/P: - Likely contributing to her fatigue  - Needs better CBG control. She will make lifestyle changes  - Intolerant to metformin in past  - Start SGLT-2 inhibitor if lifestyle modifications do not help

## 2019-03-20 NOTE — Assessment & Plan Note (Signed)
Multifactorial due to uncontrolled diabetes, untreated OSA, and fibromyalgia.

## 2019-03-20 NOTE — Progress Notes (Signed)
Internal Medicine Clinic Attending  Case discussed with Dr. Helberg at the time of the visit.  We reviewed the resident's history and exam and pertinent patient test results.  I agree with the assessment, diagnosis, and plan of care documented in the resident's note.    

## 2019-03-20 NOTE — Assessment & Plan Note (Signed)
HPI:  Patient with known OSA. She is working to get a CPAP titration study however with recent COVID-19 pandemic she has been unable to do so. This is very much likely contributing to her daytime tiredness/fatigue. It will be important to get her on a CPAP.  A/P: - CPAP titration study

## 2019-03-20 NOTE — Progress Notes (Signed)
   CC: [place]  This is a telephone encounter between [employer] and [employer] on 03/20/2019 for [place]. The visit was conducted with the patient located at home and [facility] at [facility]. The patient's identity was confirmed using their DOB and current address. The patient has consented to being evaluated through a telephone encounter and understands the associated risks (an examination cannot be done and the patient may need to come in for an appointment) / benefits (allows the patient to remain at home, decreasing exposure to coronavirus). I personally spent 9 minutes on medical discussion.   HPI:  [name] is a 53 y.o. with PMH as below.   Please see A&P for assessment of the patient's acute and chronic medical conditions.   Past Medical History:  Diagnosis Date  . Asthma    Required inhaled corticosteroid in past.   . Depression, major 02/25/2013   First episode that required was 2014. Prior episodes that did not require pt to seek medical tx.  . Hypertension    2 drug therapy  . Low back pain 08/10/2016  . Nausea 09/19/2018  . OSA (obstructive sleep apnea)    Sleep study 12/2006 : Moderately Severe OSA. AHI 38.8. O2 sat decreased to 71%.  . Severe obesity (BMI >= 40) (HCC) 10/08/2012   Obesity Class 3. BMI > 40.   . Strain of flexor muscle of right hip 09/19/2018  . Tobacco abuse    Review of Systems:  Performed and all others negative.  Assessment & Plan:   See Encounters Tab for problem based charting.  Patient discussed with Dr. Dareen Piano

## 2019-03-25 ENCOUNTER — Telehealth: Payer: Self-pay | Admitting: Internal Medicine

## 2019-03-25 NOTE — Telephone Encounter (Signed)
Pt breaking out rash all over body x 2 days, red, and swelling.  Patient would like a call back as soon as possible.  Pt has taken benadryl and is still itching.  Pt is @ work #  [phone].

## 2019-03-25 NOTE — Telephone Encounter (Signed)
Pt calls and states she started taking vitamins plus tumeric and biotin. The next day she started having this rash, started on her arms and spread to her abdomen then outward all over her body down to lower thighs and to her neck but not on face, lower legs. She states it is a raised, red, itchy rash. She has started using benadryl, stopped the vitamins and tumeric and biotin. It continues to itch. States it has "calmed down" a little. Denies fever, n&v.  Please advise.

## 2019-03-26 NOTE — Telephone Encounter (Signed)
Does not look like this was addressed. Can you call & see how she is doing. Sounds like she may have reacted to something and it was already getting better; can continue to use benadryl prn.

## 2019-03-27 NOTE — Telephone Encounter (Signed)
appt Monday 5/11

## 2019-03-31 ENCOUNTER — Ambulatory Visit: Payer: PRIVATE HEALTH INSURANCE

## 2019-04-01 ENCOUNTER — Ambulatory Visit: Payer: PRIVATE HEALTH INSURANCE

## 2019-04-03 ENCOUNTER — Encounter: Payer: Self-pay | Admitting: Internal Medicine

## 2019-04-03 ENCOUNTER — Other Ambulatory Visit: Payer: Self-pay

## 2019-04-03 ENCOUNTER — Ambulatory Visit (INDEPENDENT_AMBULATORY_CARE_PROVIDER_SITE_OTHER): Payer: PRIVATE HEALTH INSURANCE | Admitting: Internal Medicine

## 2019-04-03 VITALS — BP 117/99 | HR 101 | Temp 98.4°F | Ht 61.0 in | Wt 227.8 lb

## 2019-04-03 DIAGNOSIS — I10 Essential (primary) hypertension: Secondary | ICD-10-CM | POA: Diagnosis not present

## 2019-04-03 DIAGNOSIS — Z79899 Other long term (current) drug therapy: Secondary | ICD-10-CM

## 2019-04-03 DIAGNOSIS — M79601 Pain in right arm: Secondary | ICD-10-CM | POA: Diagnosis not present

## 2019-04-03 DIAGNOSIS — F419 Anxiety disorder, unspecified: Secondary | ICD-10-CM | POA: Diagnosis not present

## 2019-04-03 DIAGNOSIS — L508 Other urticaria: Secondary | ICD-10-CM

## 2019-04-03 NOTE — Assessment & Plan Note (Addendum)
Patient is currently on lisinopril 20 mg daily and hydrochlorothiazide 25 mg daily.  She reports that she is taking the medications with no issues, denies any lightheadedness, dizziness, headaches, chest pain, or other symptoms.  Blood pressure today is 117/99.  Appears to be well controlled on current regimen.  And: -Continue lisinopril 20 mg daily and hydrochlorothiazide 25 mg daily

## 2019-04-03 NOTE — Assessment & Plan Note (Addendum)
She reports that ever since the coronavirus pandemic has started she has been very anxious when she leaves the house, she has anxiety when she is going into any types of stores and when she is going into work.  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  She denies any anxiety when she is at home and denies any history of anxiety in the past.  Anxiety had made it difficult for her to make an appointment due to not wanting to come into the clinic.  Does not seem to have underlying anxiety, this appears to be more situational.  I do not think we should start any medications at this time, we discussed that she may benefit from speaking with therapist and she is in agreement.  Plan -Integrated behavioral health referral

## 2019-04-03 NOTE — Assessment & Plan Note (Addendum)
Patient reports that last week around Wednesday she started having diffuse hives, with red raised rashes that were pruritic, they occurred all over her body, legs, arms, back.  Patient feels like this is related to her anxiety since she has been very anxious due to the coronavirus.  She reports that she took some Benadryl, tried an oatmeal bath and some over-the-counter itching lotion which she states did not help very much.  She did not states her aunt gave her a medication for her nerves, she is unsure what medication that was, and she got some hydrocortisone cream from a friend, she reports the combination of these medications helped and now she has no rash at this time.  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  On chart review it appears that she has had similar issues in the past, there was a question about lisinopril being the culprit however the patient has been on lisinopril for a few years now and since the rash has resolved this seems less likely.  She has had evaluation by allergy and immunology years ago that showed a reaction to milk, she reported that she has still been drinking dairy. On exam she does not have any rash at this time.   We discussed that since the symptoms have resolved there may have been some environmental changes that caused it, it also may be related to some anxiety however this is unclear. Advised patient to monitor for recurrence of symptoms and make note if there is any triggers around that time.  Also advised patient to avoid dairy findings previous allergy profile.  Plan No further work-up at this time If symptoms recur can prescribe hydrocortisone cream, may need further work-up with allergy and immunology

## 2019-04-03 NOTE — Progress Notes (Signed)
   CC: Rash, anxiety, right arm pain, and hypertension  HPI:  Crystal English is a 53 y.o.   Past Medical History:  Diagnosis Date  . Asthma    Required inhaled corticosteroid in past.   . Depression, major 02/25/2013   First episode that required was 2014. Prior episodes that did not require pt to seek medical tx.  . Hypertension    2 drug therapy  . Low back pain 08/10/2016  . Nausea 09/19/2018  . OSA (obstructive sleep apnea)    Sleep study 12/2006 : Moderately Severe OSA. AHI 38.8. O2 sat decreased to 71%.  . Severe obesity (BMI >= 40) (HCC) 10/08/2012   Obesity Class 3. BMI > 40.   . Strain of flexor muscle of right hip 09/19/2018  . Tobacco abuse    Review of Systems: Reports anxiety, right arm pain, and rash that has since resolved.  Denies fevers, chills, nausea, vomiting, headaches, lightheadedness,, shortness of breath, cough, staying, or other joint pains.  Physical Exam:  Vitals:   04/03/19 1007  BP: (!) 117/99  Pulse: (!) 101  Temp: 98.4 F (36.9 C)  TempSrc: Oral  SpO2: 99%  Weight: 227 lb 12.8 oz (103.3 kg)  Height: 5\' 1"  (1.549 m)   General: Well-appearing female, no acute distress, obese Cardiac: Regular rate and rhythm, no murmurs rubs or gallops Pulmonary: Clear to auscultation, no wheezing, rhonchi, or rales, normal work of breathing Musculoskeletal: Tender to palpation on anterior aspect of right forearm, normal sensation, good pulses, normal strength Skin: No rashes, erythema, edema, or other wounds noted  Assessment & Plan:   See Encounters Tab for problem based charting.  Patient discussed with Dr. Beryle Beams

## 2019-04-03 NOTE — Patient Instructions (Signed)
[name],  It was a pleasure to see you today. Thank you for coming in.   Today we discussed your rash and anxiety.  I am glad that the rash has improved, it is unclear what caused it but it is possible that it is related to some anxiety.  If your rash occurs again please keep note of any triggers that may have caused this, and continue to take pictures of the rash.  I have placed a referral for the behavioral health specialist and she will get in contact with you to discuss anxiety.  As far as her right arm pain goes you can continue using ibuprofen as needed, continue to stretch the area and apply heating pads.  Please return to clinic in 6 months or sooner if needed.   Thank you again for coming in.   [name]

## 2019-04-03 NOTE — Assessment & Plan Note (Signed)
Patient reports that she has been having this right arm pain for a few weeks now, she states it is a pulling sensation and located over the medial aspect of her right forearm, she has some pain when she squeezes her hands and flexes her elbow, she uses some ibuprofen but states that she is trying to avoid using anything last she is also having a headache, she is unsure if this helps.  She denies any numbness, tingling, change in sensation, or weakness in her hands.  On exam she has some tenderness over the anterior aspect of her right forearm and pain with hand grip.  We discussed that this appears to be a muscle strain.  We discussed starting a course of NSAIDs to assist with the pain but patient did she did not want to start any medications.   Plan: -Advised patient to continue taking ibuprofen as needed -Advised patient to use warm compresses and stretch the area

## 2019-04-04 NOTE — Progress Notes (Signed)
Medicine attending: Medical history, presenting problems, physical findings, and medications, reviewed with resident physician Dr Lonia Skinner on the day of the patient visit and I concur with her evaluation and management plan. Pt come in for eval of rash/urticaria which has resolved on current exam. Dr Raliegh Ip suspects component of agorophobia related to going out in view of current coronavirus pandemic. Will Rx symptomatically. Referral to our clinical psychologist for support/counselling.

## 2019-04-09 ENCOUNTER — Telehealth: Payer: Self-pay | Admitting: Obstetrics & Gynecology

## 2019-04-09 ENCOUNTER — Telehealth: Payer: Self-pay | Admitting: *Deleted

## 2019-04-09 ENCOUNTER — Telehealth: Payer: Self-pay

## 2019-04-09 NOTE — Telephone Encounter (Signed)
Requesting to speak with a nurse about getting a referral to GYN. Please call pt back.

## 2019-04-09 NOTE — Telephone Encounter (Signed)
RETURNED CALL TO PATIENT REGARDING HER GYN REFERRAL. PATIENT DID NOT SHOW FOR APPOINTMENT. January 2020.  INSTRUCTED PATIENT TO CALL THE GYN CLINIC AT [phone] TO SEE IF OFFICE WILLING TO RESCHEDULE .

## 2019-04-09 NOTE — Telephone Encounter (Signed)
The patient stated she would like to be scheduled for an annual. She also stated she thinks she may be experiencing an uterine prolapse. She stated she now has to wear depends and it feels like her bladder is under pressure. She stated she drinks water but if she doesn't immediately go to the restroom the urination then becomes uncontrollable.

## 2019-04-10 ENCOUNTER — Ambulatory Visit: Payer: PRIVATE HEALTH INSURANCE | Admitting: Licensed Clinical Social Worker

## 2019-04-10 ENCOUNTER — Telehealth: Payer: Self-pay | Admitting: Licensed Clinical Social Worker

## 2019-04-10 NOTE — Telephone Encounter (Signed)
Patient was contacted twice for our scheduled appointment. Patient requested to be rescheduled, and will be added to my schedule for 5/26 @ 1:00.

## 2019-04-13 ENCOUNTER — Telehealth (HOSPITAL_COMMUNITY): Payer: Self-pay | Admitting: Emergency Medicine

## 2019-04-13 ENCOUNTER — Encounter (HOSPITAL_COMMUNITY): Payer: Self-pay | Admitting: Emergency Medicine

## 2019-04-13 ENCOUNTER — Ambulatory Visit (HOSPITAL_COMMUNITY)
Admission: EM | Admit: 2019-04-13 | Discharge: 2019-04-13 | Disposition: A | Discharge status: Home / Self Care | Payer: PRIVATE HEALTH INSURANCE | Attending: Emergency Medicine | Admitting: Emergency Medicine

## 2019-04-13 DIAGNOSIS — K602 Anal fissure, unspecified: Secondary | ICD-10-CM

## 2019-04-13 DIAGNOSIS — K921 Melena: Secondary | ICD-10-CM

## 2019-04-13 DIAGNOSIS — B379 Candidiasis, unspecified: Secondary | ICD-10-CM

## 2019-04-13 LAB — OCCULT BLOOD, POC DEVICE: Fecal Occult Bld: NEGATIVE

## 2019-04-13 MED ORDER — FLUCONAZOLE 200 MG PO TABS: 200.0000 mg | ORAL_TABLET | Freq: Once | ORAL | 0 refills | Status: DC

## 2019-04-13 MED ORDER — FLUCONAZOLE 200 MG PO TABS: 200.0000 mg | ORAL_TABLET | Freq: Once | ORAL | 0 refills | Status: AC

## 2019-04-13 NOTE — ED Provider Notes (Signed)
[facility]    CSN: [id number] Arrival date & time: 04/13/19  1142     History   Chief Complaint Chief Complaint  Patient presents with  . Urinary Frequency    HPI KEYONA EMRICH is a 53 y.o. female with history of diabetes, obesity presenting for acute concern of hematochezia.  Patient states this is been going on "for a long time ".  Patient states sometimes she has pain with bowel movement, "where they put stitches in when I tore when I had my son".  Patient states this is over 20 years ago.  Patient is unsure if she has history of hemorrhoids.  Patient feels that she might be constipated as well she does not go every day.  Patient denies loose, watery stools, blood in her stool, last bowel movement was this morning. Patient also notes that she feels like she may have a prolapsed uterus contributing to her going to the bathroom.  Patient states that she is taking her diabetic medications, though does not routinely check her sugars.  Last A1c was done 12/26/2018: 7.7%.  Patient states that she has had increased urination, though has also been more thirsty.she also endorses vaginal itching: Denies vaginal discharge, malodor, vaginal or pelvic pain.  Patient is routinely followed by her PCP for her chronic conditions.  Patient has not had routine colonoscopy, denies family or personal history of colorectal malignancy.    Past Medical History:  Diagnosis Date  . Asthma    Required inhaled corticosteroid in past.   . Depression, major 02/25/2013   First episode that required was 2014. Prior episodes that did not require pt to seek medical tx.  . Hypertension    2 drug therapy  . Low back pain 08/10/2016  . Nausea 09/19/2018  . OSA (obstructive sleep apnea)    Sleep study 12/2006 : Moderately Severe OSA. AHI 38.8. O2 sat decreased to 71%.  . Severe obesity (BMI >= 40) (HCC) 10/08/2012   Obesity Class 3. BMI > 40.   . Strain of flexor muscle of right hip 09/19/2018  .  Tobacco abuse     Patient Active Problem List   Diagnosis Date Noted  . Anxiety 04/03/2019  . Right arm pain 04/03/2019  . Fatigue 03/20/2019  . Nerve entrapment syndrome 10/12/2018  . Urge incontinence 10/12/2018  . Dyspepsia 09/19/2018  . Fibromyalgia 04/14/2018  . Whole body pain 03/30/2018  . Tension headache 05/22/2017  . Allergic rhinitis 05/04/2017  . Migraine 08/10/2016  . Diabetes mellitus ([place]) 02/14/2016  . Perimenopause 12/23/2014  . Chronic urticaria 04/08/2014  . Cervical disc disorder with radiculopathy of cervical region 04/08/2014  . Depression, major 02/25/2013  . Healthcare maintenance 12/16/2012  . Tobacco abuse 10/08/2012  . Severe obesity (BMI >= 40) ([place]) 10/08/2012  . Asthma, chronic 03/09/2010  . OSA (obstructive sleep apnea) 01/01/2007  . Essential hypertension 11/23/2006    Past Surgical History:  Procedure Laterality Date  . DILATION AND CURETTAGE OF UTERUS    . ECTOPIC PREGNANCY SURGERY    . LAPAROTOMY     for tubal pregnancy  . TUBAL LIGATION      OB History   No obstetric history on file.      Home Medications    Prior to Admission medications   Medication Sig Start Date End Date Taking? Authorizing Provider  albuterol (PROVENTIL HFA;VENTOLIN HFA) 108 (90 Base) MCG/ACT inhaler Inhale 2 puffs into the lungs every 6 (six) hours as needed for wheezing. 01/24/17  Alphonzo Grieve, MD  Blood Glucose Monitoring Suppl (ONETOUCH VERIO) w/Device KIT PLEASE CHECK BLOOD SUGAR 3 TIMES DAILY 09/04/18   Ina Homes, MD  cetirizine (ZYRTEC) 10 MG chewable tablet Chew 1 tablet (10 mg total) by mouth daily as needed for allergies or rhinitis. 05/04/17   Minus Liberty, MD  DULoxetine (CYMBALTA) 60 MG capsule Take 1 capsule (60 mg total) by mouth daily. 12/31/18 03/31/19  Ina Homes, MD  famotidine (PEPCID) 20 MG tablet Take 1 tablet (20 mg total) by mouth 2 (two) times daily. 12/31/18   Ina Homes, MD  fluconazole (DIFLUCAN) 200 MG  tablet Take 1 tablet (200 mg total) by mouth once for 1 dose. May repeat in 72 hours if needed 04/13/19 04/13/19  Hall-Potvin, [place], PA-C  fluticasone (FLONASE) 50 MCG/ACT nasal spray Place 2 sprays into both nostrils daily. 05/04/17 05/04/18  Minus Liberty, MD  glucose blood test strip Please check blood sugars 3 times daily 12/31/18   Ina Homes, MD  ibuprofen (ADVIL,MOTRIN) 800 MG tablet Take 1 tablet (800 mg total) by mouth every 8 (eight) hours as needed for moderate pain. 12/31/18   Ina Homes, MD  Lancets [facility] ULTRASOFT) lancets Please check blood sugars 3 times daily 12/31/18   Ina Homes, MD  lisinopril-hydrochlorothiazide (PRINZIDE,ZESTORETIC) 20-25 MG tablet Take 1 tablet by mouth daily. 12/31/18   Ina Homes, MD  pantoprazole (PROTONIX) 20 MG tablet Take 1 tablet (20 mg total) by mouth daily. 03/30/17   Rivet, Sindy [place], MD  sitaGLIPtin (JANUVIA) 100 MG tablet Take 1 tablet (100 mg total) by mouth daily. 12/31/18   Ina Homes, MD  SUMAtriptan (IMITREX) 100 MG tablet Take 1 tablet (100 mg total) by mouth once as needed for migraine. May repeat in 2 hours if headache persists or recurs. 12/31/18   Ina Homes, MD    Family History Family History  Problem Relation Age of Onset  . Stroke Father        in his 83's  . Heart disease Father        died <47 y.o  . Diabetes Mother   . Hypertension Mother   . Arthritis Mother   . Hyperlipidemia Mother   . Diabetes Maternal Aunt   . Cancer Maternal Aunt        breast   . Breast cancer Maternal Aunt 60  . Heart disease Maternal Grandmother   . Asthma Maternal Aunt     Social History Social History   Tobacco Use  . Smoking status: Former Smoker    Packs/day: 0.03    Years: 30.00    Pack years: 0.90    Types: Cigarettes    Last attempt to quit: 05/21/2015    Years since quitting: 3.8  . Smokeless tobacco: Never Used  . Tobacco comment: 3 cigs per day  Substance Use Topics  . Alcohol use: Yes     Alcohol/week: 0.0 standard drinks    Comment: rarely.  . Drug use: No     Allergies   Patient has no known allergies.   Review of Systems Review of Systems  Constitutional: Negative for fever and unexpected weight change.  Gastrointestinal: Positive for blood in stool, constipation and rectal pain. Negative for abdominal pain, anal bleeding and diarrhea.  Endocrine: Positive for polydipsia and polyuria. Negative for polyphagia.  Genitourinary: Negative for decreased urine volume, dyspareunia, dysuria, frequency, genital sores, hematuria, pelvic pain, urgency, vaginal bleeding, vaginal discharge and vaginal pain.     Physical Exam Triage Vital Signs ED Triage Vitals  Enc Vitals Group     BP 04/13/19 1149 135/90     Pulse Rate 04/13/19 1149 (!) 108     Resp 04/13/19 1149 16     Temp 04/13/19 1149 98.4 F (36.9 C)     Temp src --      SpO2 04/13/19 1149 98 %     Weight --      Height --      Head Circumference --      Peak Flow --      Pain Score 04/13/19 1151 0     Pain Loc --      Pain Edu? --      Excl. in [place]? --    No data found.  Updated Vital Signs BP 135/90   Pulse (!) 108   Temp 98.4 F (36.9 C)   Resp 16   SpO2 98%   Visual Acuity Right Eye Distance:   Left Eye Distance:   Bilateral Distance:    Right Eye Near:   Left Eye Near:    Bilateral Near:     Physical Exam Constitutional:      General: She is not in acute distress.    Appearance: She is obese.  HENT:     Head: Normocephalic and atraumatic.  Eyes:     Conjunctiva/sclera: Conjunctivae normal.     Pupils: Pupils are equal, round, and reactive to light.  Cardiovascular:     Rate and Rhythm: Regular rhythm. Tachycardia present.     Comments: Patient denies chest pain, SOB Pulmonary:     Effort: Pulmonary effort is normal. No respiratory distress.     Breath sounds: No wheezing.  Abdominal:     Tenderness: There is no right CVA tenderness or left CVA tenderness.     Comments: Obese  abdomen that is soft, nontender to palpation.  Bowel sounds normoactive in all 4 quadrants.  Genitourinary:    Rectum: Guaiac result negative.     Comments: Chaperone present for exam.  Scant amount of thick, curdy white discharge around the vulva and vaginal canal.  Patient nontender to palpation.  Uterus is mobile, nontender.  Negative for CMT.  Mild cystocele and rectocele noted with Valsalva no prolapsing tissue.  Residual hemorrhoid tag noted anteriorly.  Nontender, no active bleeding.  Few, scant superficial perianal fissures that are tender to palpation.  No active discharge, underlying erythema.  Anal sphincter tone intact.  No inflamed hemorrhoids palpated.  Rectum is nontender to palpation.  No masses appreciated. Skin:    General: Skin is warm.     Coloration: Skin is not pale.  Neurological:     Mental Status: She is alert and oriented to person, place, and time.  Psychiatric:        Mood and Affect: Mood normal.        Thought Content: Thought content normal.      UC Treatments / Results  Labs (all labs ordered are listed, but only abnormal results are displayed) Labs Reviewed  OCCULT BLOOD, POC DEVICE    EKG None  Radiology No results found.  Procedures Procedures (including critical care time)  Medications Ordered in UC Medications - No data to display  Initial Impression / Assessment and Plan / UC Course  I have reviewed the triage vital signs and the nursing notes.  Pertinent labs & imaging results that were available during my care of the patient were reviewed by me and considered in my medical decision making (see chart for details).  Pleasant 53 year old female with history of diabetes, obesity presenting for numerous concerns.  Patient routinely followed by her primary care for chronic conditions.  Also has GYN for her routine Pap smears.  Patient's concern was focused to painful bowel movements with blood when she wipes.  These are likely attributed  to her perianal fissures.  Discussed importance of bowel regularity as well as OTC medications and sits baths for pain relief.  Patient will follow-up with primary care regarding chronic morbidities and colonoscopy for which she is 4 years overdue. Final Clinical Impressions(s) / UC Diagnoses   Final diagnoses:  Perianal fissure  Yeast infection     Discharge Instructions     Take Diflucan as instructed for yeast infection. You are overdue for your colonoscopy.  Follow-up with your primary care regarding further diabetes management and colorectal cancer screening.  This will help determine etiology of blood in your stool. You may use Preparation H and rectal suppositories for additional relief and pain with bowel movements.  Recommended that you use 1 capful of MiraLAX daily for the next 2 to 3 weeks to help keep stool nice and soft. Can also use sitz bath's as needed for additional pain relief.    ED Prescriptions    Medication Sig Dispense Auth. Provider   fluconazole (DIFLUCAN) 200 MG tablet Take 1 tablet (200 mg total) by mouth once for 1 dose. May repeat in 72 hours if needed 2 tablet Hall-Potvin, [place], PA-C     Controlled Substance Prescriptions [place] Controlled Substance Registry consulted? Not Applicable   Quincy Sheehan, [place] 04/13/19 1243

## 2019-04-13 NOTE — ED Triage Notes (Addendum)
Pt states "I think I have a prolasped uterus and its affecting my bladder, I cant make it to the restroom in time before I go on myself". Pt also states its painful to have a bowel movement, was going to take a laxative but her "spirit" told her not to. Pt states "I am very spiritual".

## 2019-04-13 NOTE — ED Notes (Signed)
Patient able to ambulate independently  

## 2019-04-13 NOTE — Telephone Encounter (Signed)
Pharmacy change

## 2019-04-13 NOTE — Discharge Instructions (Addendum)
Take Diflucan as instructed for yeast infection. You are overdue for your colonoscopy.  Follow-up with your primary care regarding further diabetes management and colorectal cancer screening.  This will help determine etiology of blood in your stool. You may use Preparation H and rectal suppositories for additional relief and pain with bowel movements.  Recommended that you use 1 capful of MiraLAX daily for the next 2 to 3 weeks to help keep stool nice and soft. Can also use sitz bath's as needed for additional pain relief.

## 2019-04-15 ENCOUNTER — Ambulatory Visit: Payer: PRIVATE HEALTH INSURANCE | Admitting: Licensed Clinical Social Worker

## 2019-04-16 ENCOUNTER — Telehealth: Payer: Self-pay | Admitting: Licensed Clinical Social Worker

## 2019-04-16 ENCOUNTER — Other Ambulatory Visit: Payer: Self-pay

## 2019-04-16 ENCOUNTER — Ambulatory Visit (INDEPENDENT_AMBULATORY_CARE_PROVIDER_SITE_OTHER): Payer: PRIVATE HEALTH INSURANCE | Admitting: Internal Medicine

## 2019-04-16 VITALS — BP 136/86 | HR 91 | Temp 98.5°F | Ht 61.0 in | Wt 228.5 lb

## 2019-04-16 DIAGNOSIS — B379 Candidiasis, unspecified: Secondary | ICD-10-CM

## 2019-04-16 DIAGNOSIS — E119 Type 2 diabetes mellitus without complications: Secondary | ICD-10-CM

## 2019-04-16 DIAGNOSIS — E1169 Type 2 diabetes mellitus with other specified complication: Secondary | ICD-10-CM

## 2019-04-16 DIAGNOSIS — Z7984 Long term (current) use of oral hypoglycemic drugs: Secondary | ICD-10-CM | POA: Diagnosis not present

## 2019-04-16 DIAGNOSIS — Z1211 Encounter for screening for malignant neoplasm of colon: Secondary | ICD-10-CM

## 2019-04-16 LAB — POCT GLYCOSYLATED HEMOGLOBIN (HGB A1C): HbA1c POC (<> result, manual entry): >14 % — AB (ref 4.0–5.6)

## 2019-04-16 LAB — GLUCOSE, CAPILLARY: Glucose-Capillary: 483 mg/dL — ABNORMAL HIGH (ref 70–99)

## 2019-04-16 MED ORDER — SITAGLIPTIN PHOSPHATE 100 MG PO TABS: 100.0000 mg | ORAL_TABLET | Freq: Every day | ORAL | 3 refills | Status: DC

## 2019-04-16 MED ORDER — METFORMIN HCL ER 500 MG PO TB24: 500.0000 mg | ORAL_TABLET | Freq: Every day | ORAL | 11 refills | Status: DC

## 2019-04-16 NOTE — Telephone Encounter (Signed)
Patient was contacted due to a scheduled appointment. Patient did not answer, and the voicemail box was full.

## 2019-04-16 NOTE — Progress Notes (Signed)
   CC: yeast infection   HPI:  [name] is a 53 y.o. with PMH as listed below who presents for yeast infection. Please see the assessment and plans for the status of the patient chronic medical problems.   Past Medical History:  Diagnosis Date  . Asthma    Required inhaled corticosteroid in past.   . Depression, major 02/25/2013   First episode that required was 2014. Prior episodes that did not require pt to seek medical tx.  . Hypertension    2 drug therapy  . Low back pain 08/10/2016  . Nausea 09/19/2018  . OSA (obstructive sleep apnea)    Sleep study 12/2006 : Moderately Severe OSA. AHI 38.8. O2 sat decreased to 71%.  . Severe obesity (BMI >= 40) (HCC) 10/08/2012   Obesity Class 3. BMI > 40.   . Strain of flexor muscle of right hip 09/19/2018  . Tobacco abuse    Review of Systems: Refer to history of present illness and assessment and plans for pertinent review of systems, all others reviewed and negative  Physical Exam:  Vitals:   04/16/19 1447 04/16/19 1452  BP: (!) 132/101 136/86  Pulse: 97 91  Temp: 98.5 F (36.9 C)   TempSrc: Oral   SpO2: 97%   Weight: 228 lb 8 oz (103.6 kg)   Height: 5\' 1"  (1.549 m)    General: well appearing, no acute distress Eyes: no conjunctivitis, no jaundice  Cardiac: regular rate and rhythm, no murmurs, rubs, or gallops, no peripheral edema  Pulm: lungs are clear to auscultation, no wheezes or rhonchi  GI: the abdomen is soft, non tender, non distended  Skin: there is thinning and excoriated skin around the perineum   Assessment & Plan:   Preventative health  No family history of colon cancer. She denies melena or hematochezia. She denies constipation. She denies weight loss.   - Immunohistochemical stool testing today   Yeast infection  Patient describes prior symptoms of yeast infection. She was evaluated for this at a recent urgent care visit and was prescribed fluconazole which helped to relieve the symptoms. She  has had pruritis and burning around the perineum.   - continue to monitor    Type 2 Diabetes Mellitus  Diabetes is not controlled. Hemoglobin A1c today is >14, this is worse than her prior hemoglobin A1c which was 7.7 three months ago. Patient reports she only forgets to take Januvia about twice per week. She has been working from home since the covid pandemic began and described drinking a lot of soda, eating cake, and candy. This uncontrolled diabetes is likely the cause of her yeast infection. She is not willing to start any injectable medications. She has been on metformin in the past but had nausea from this, it was the short acting formulation. She is willing to try long acting metformin but believes she can get back on track with her diet when she returns to work next week. Today she denies polydypsia, polyuria, polyphagia, abdominal pain or symptoms of dehydration.   Assessment: uncontrolled type 2 diabetes mellitus.   - continue [place]  - start metformin xr 500 mg daily for one week then increase to 1000 mg daily  - rtc in two weeks for follow up   See Encounters Tab for problem based charting.  Patient discussed with Dr. Angelia Mould

## 2019-04-16 NOTE — Patient Instructions (Signed)
Thank you for coming to the clinic today. It was a pleasure to see you.   Please call us if your yeast infection doesn't continue to improve   Dont forget to send the stool test in   Please schedule your follow up with Dr. Tarri Abernethy in three months   Please call the internal medicine center clinic if you have any questions or concerns, we may be able to help and keep you from a long and expensive emergency room wait. Our clinic and after hours phone number is [phone], the best time to call is Monday through Friday 9 am to 4 pm but there is always someone available 24/7 if you have an emergency. If you need medication refills please notify your pharmacy one week in advance and they will send [place] a request.

## 2019-04-17 ENCOUNTER — Encounter: Payer: Self-pay | Admitting: Internal Medicine

## 2019-04-17 DIAGNOSIS — B379 Candidiasis, unspecified: Secondary | ICD-10-CM | POA: Insufficient documentation

## 2019-04-17 NOTE — Assessment & Plan Note (Addendum)
Diabetes is not controlled. Hemoglobin A1c today is >14, this is worse than her prior hemoglobin A1c which was 7.7 three months ago. Patient reports she only forgets to take Januvia about twice per week. She has been working from home since the covid pandemic began and described drinking a lot of soda, eating cake, and candy. This uncontrolled diabetes is likely the cause of her yeast infection. She is not willing to start any injectable medications. She has been on metformin in the past but had nausea from this, it was the short acting formulation. She is willing to try long acting metformin but believes she can get back on track with her diet when she returns to work next week. Today she denies polydypsia, polyuria, polyphagia, abdominal pain or symptoms of dehydration.   Assessment: uncontrolled type 2 diabetes mellitus.   - continue [place]  - start metformin xr 500 mg daily for one week then increase to 1000 mg daily  - rtc in two weeks for follow up

## 2019-04-17 NOTE — Assessment & Plan Note (Signed)
Patient describes prior symptoms of yeast infection. She was evaluated for this at a recent urgent care visit and was prescribed fluconazole which helped to relieve the symptoms. She has had pruritis and burning around the perineum.   - continue to monitor

## 2019-04-22 NOTE — Progress Notes (Signed)
Internal Medicine Clinic Attending  Case discussed with Dr. Blum at the time of the visit.  We reviewed the resident's history and exam and pertinent patient test results.  I agree with the assessment, diagnosis, and plan of care documented in the resident's note. 

## 2019-05-01 ENCOUNTER — Encounter: Payer: Self-pay | Admitting: Internal Medicine

## 2019-05-01 ENCOUNTER — Other Ambulatory Visit: Payer: Self-pay

## 2019-05-01 ENCOUNTER — Ambulatory Visit (INDEPENDENT_AMBULATORY_CARE_PROVIDER_SITE_OTHER): Payer: PRIVATE HEALTH INSURANCE | Admitting: Internal Medicine

## 2019-05-01 VITALS — BP 121/76 | HR 103 | Temp 98.6°F | Ht 61.0 in | Wt 220.7 lb

## 2019-05-01 DIAGNOSIS — H539 Unspecified visual disturbance: Secondary | ICD-10-CM | POA: Insufficient documentation

## 2019-05-01 DIAGNOSIS — H538 Other visual disturbances: Secondary | ICD-10-CM | POA: Diagnosis not present

## 2019-05-01 DIAGNOSIS — Z7984 Long term (current) use of oral hypoglycemic drugs: Secondary | ICD-10-CM

## 2019-05-01 DIAGNOSIS — E118 Type 2 diabetes mellitus with unspecified complications: Secondary | ICD-10-CM | POA: Diagnosis not present

## 2019-05-01 DIAGNOSIS — M7711 Lateral epicondylitis, right elbow: Secondary | ICD-10-CM | POA: Diagnosis not present

## 2019-05-01 DIAGNOSIS — M7710 Lateral epicondylitis, unspecified elbow: Secondary | ICD-10-CM | POA: Insufficient documentation

## 2019-05-01 DIAGNOSIS — E119 Type 2 diabetes mellitus without complications: Secondary | ICD-10-CM

## 2019-05-01 NOTE — Assessment & Plan Note (Signed)
Patient reports 1-2days of worsened visual acuity. She wears glasses and her vision is improved with the glasses but still worse than baseline. She has no pain, no injection, no change in visual field, PERRL, EOMI. Changes are likely sue to progression of diabetic eye. - Referral to ophthalmology for further evalauation

## 2019-05-01 NOTE — Progress Notes (Signed)
   CC: Diabetes follow up, Blurry Vision, Arm Pain  HPI:  [name] is a 53 y.o. F with PMHx listed below presenting for Diabetes follow up. Please see the A&P for the status of the patient's chronic medical problems.  Past Medical History:  Diagnosis Date  . Asthma    Required inhaled corticosteroid in past.   . Depression, major 02/25/2013   First episode that required was 2014. Prior episodes that did not require pt to seek medical tx.  . Hypertension    2 drug therapy  . Low back pain 08/10/2016  . Nausea 09/19/2018  . OSA (obstructive sleep apnea)    Sleep study 12/2006 : Moderately Severe OSA. AHI 38.8. O2 sat decreased to 71%.  . Severe obesity (BMI >= 40) (HCC) 10/08/2012   Obesity Class 3. BMI > 40.   . Strain of flexor muscle of right hip 09/19/2018  . Tobacco abuse    Review of Systems:  Performed and all others negative.  Physical Exam:  Vitals:   05/01/19 0939 05/01/19 0941  BP:  121/76  Pulse:  (!) 103  Temp:  98.6 F (37 C)  TempSrc:  Oral  SpO2:  97%  Weight: 220 lb 11.2 oz (100.1 kg)   Height: 5\' 1"  (1.549 m)    Physical Exam Constitutional:      General: She is not in acute distress.    Appearance: Normal appearance.  Eyes:     Comments: no pain, no injection, no change in visual field, PERRL, EOMI   Cardiovascular:     Rate and Rhythm: Normal rate and regular rhythm.     Pulses: Normal pulses.     Heart sounds: Normal heart sounds.  Pulmonary:     Effort: Pulmonary effort is normal. No respiratory distress.     Breath sounds: Normal breath sounds.  Abdominal:     General: Bowel sounds are normal. There is no distension.     Palpations: Abdomen is soft.     Tenderness: There is no abdominal tenderness.  Musculoskeletal:        General: Tenderness present. No swelling or deformity.     Comments: TTP at lateral epicondyl Pain elicited at lateral epicondyle with wrist extension  Skin:    General: Skin is warm and dry.   Neurological:     General: No focal deficit present.     Mental Status: Mental status is at baseline.    Assessment & Plan:   See Encounters Tab for problem based charting.  Patient discussed with Dr. Evette Doffing

## 2019-05-01 NOTE — Assessment & Plan Note (Signed)
Patient reports 2-3 months of pain in her right arm located in the region of her lateral epicondyle and extensor musculature. Denies specific repetitive movements. Uses a mouse with the hand at work, but uses wrist pad. No weakness, numbness, or tingling. Area is TTP.   Presentation is consistent with lateral epicondylits. will have patient treat with scheduled NSAIDs for 1-2 weeks then PRN after this and an Arm band. - Rest, avoid painful activities - Arm band for lateral epicondylitis - Schedule Ibuprofen for 7days then as needed.

## 2019-05-01 NOTE — Patient Instructions (Signed)
Thank you for allowing [place] to care for you  For your diabetes - Continue with current medication  For your vision changes - Referral to eye doctor placed - This is likely due to progression of your diabetes  For your right arm pain - This is consistent with lateral epicondylitis (also called tennis elbow) - Take ibuprofen scheduled for 1 week then as needed - Rest the muscle the best you can - Pick up a strap from from the pharmacy to wear

## 2019-05-01 NOTE — Progress Notes (Signed)
Patient scheduled for televisit with me, but showed up in-person to clinic and was seen by Dr. Trilby Drummer.

## 2019-05-01 NOTE — Assessment & Plan Note (Signed)
Patient noted to have uncontrolled DM at visit 2 weeks ago. Her A1c had increased from 7.7 to >14. She was eating a lot of sugar food at home during quarantine and had been missing a couple of Januvia doses per week. She declined injectable medications, but was started on metformin in addition to her Januvia.  She states she has cut out all sugary foods and has even lost about 5 lbs. She has been taking Januvia and metformin daily. She has some abdominal cramping with the metformin but no diarrhea. Will continue current dose until these symptoms pass. She repost BS improved to the 200s (down from 300 and 400s before) with current interventions.  She does report some vision changes (noted elsewhere) which may be due to progression of her diabetes. Will continue current regimen and have patient follow up at next available PCP visit. - Januvia 100mg  Daily - Metformin xr 500mg  Daily, up titrate in future if tolerated - PCP follow up 05/29/2019

## 2019-05-02 NOTE — Progress Notes (Signed)
Internal Medicine Clinic Attending  Case discussed with Dr. Melvin  at the time of the visit.  We reviewed the resident's history and exam and pertinent patient test results.  I agree with the assessment, diagnosis, and plan of care documented in the resident's note.  

## 2019-05-07 ENCOUNTER — Ambulatory Visit (INDEPENDENT_AMBULATORY_CARE_PROVIDER_SITE_OTHER): Payer: PRIVATE HEALTH INSURANCE | Admitting: Internal Medicine

## 2019-05-07 ENCOUNTER — Encounter: Payer: Self-pay | Admitting: Internal Medicine

## 2019-05-07 ENCOUNTER — Encounter: Payer: Self-pay | Admitting: *Deleted

## 2019-05-07 ENCOUNTER — Other Ambulatory Visit: Payer: Self-pay

## 2019-05-07 VITALS — BP 126/86 | HR 97 | Temp 98.4°F | Ht 61.0 in | Wt 223.7 lb

## 2019-05-07 DIAGNOSIS — I10 Essential (primary) hypertension: Secondary | ICD-10-CM

## 2019-05-07 DIAGNOSIS — E119 Type 2 diabetes mellitus without complications: Secondary | ICD-10-CM | POA: Diagnosis not present

## 2019-05-07 DIAGNOSIS — Z79899 Other long term (current) drug therapy: Secondary | ICD-10-CM

## 2019-05-07 DIAGNOSIS — Z7984 Long term (current) use of oral hypoglycemic drugs: Secondary | ICD-10-CM | POA: Diagnosis not present

## 2019-05-07 LAB — HM DIABETES EYE EXAM

## 2019-05-07 NOTE — Patient Instructions (Signed)
GREAT WORK ON THE WEIGHT LOSS AND GETTING YOUR DIABETES UNDER CONTROL!  Thank you for allowing me to provide your care.   I would like to see you back in 2 months for a repeat A1c check. Please call me before hand if you continue to feel bad.

## 2019-05-07 NOTE — Assessment & Plan Note (Deleted)
165 fasting  175-180 throughout the day   Cutting out carbs   Metformin XR 1000 mg QD

## 2019-05-07 NOTE — Progress Notes (Signed)
   CC: F/ DM and HTN   HPI:  [name] is a 52 y.o. female with PMHx listed below presenting for hypertension and diabetes. Please see the A&P for the status of the patient's chronic medical problems.  Past Medical History:  Diagnosis Date  . Asthma    Required inhaled corticosteroid in past.   . Depression, major 02/25/2013   First episode that required was 2014. Prior episodes that did not require pt to seek medical tx.  . Hypertension    2 drug therapy  . Low back pain 08/10/2016  . Nausea 09/19/2018  . OSA (obstructive sleep apnea)    Sleep study 12/2006 : Moderately Severe OSA. AHI 38.8. O2 sat decreased to 71%.  . Severe obesity (BMI >= 40) (HCC) 10/08/2012   Obesity Class 3. BMI > 40.   . Strain of flexor muscle of right hip 09/19/2018  . Tobacco abuse    Review of Systems:  Performed and all others negative.  Physical Exam: Vitals:   05/07/19 1558  BP: 126/86  Pulse: 97  Temp: 98.4 F (36.9 C)  TempSrc: Oral  SpO2: 100%  Weight: 223 lb 11.2 oz (101.5 kg)  Height: 5\' 1"  (1.549 m)   General: Well nourished female in no acute distress Pulm: Good air movement with no wheezing or crackles  CV: RRR, no murmurs, no rubs   Assessment & Plan:   See Encounters Tab for problem based charting.  Patient discussed with Dr. Angelia Mould

## 2019-05-08 NOTE — Assessment & Plan Note (Signed)
In early February the patient's A1c was 7.7. She states that with working from home she began to eat a lot more carbs and began to experience signs and symptoms of hyperglycemia. Repeat A1c approximately one month ago was found to be greater than 14. Her metformin was increased at that time. Since her last visit she is no longer having polyuria and polydipsia, blurry vision, nausea/vomiting. She states that she is decreased the amount of carbs she is in taking. Her fasting blood sugar is around 165. Throughout the day she states that it never gets above 195. She did not bring her meter with her today. She has had this significant amount of work due to her symptoms of hyperglycemia. I have provided her with work note today.   She will continue Januvia 100 mg daily and metformin 1000 mg at night. She will follow-up to months and we will recheck in A1c at that point.

## 2019-05-08 NOTE — Assessment & Plan Note (Signed)
Currently well controlled on lisinopril-HCTZ. No side effects from the medications. No issues affording the medications. BMP done in February with good renal function and potassium.  Will continue Lisinopril 20 mg and HCTZ 25 mg.

## 2019-05-16 ENCOUNTER — Encounter: Payer: Self-pay | Admitting: Licensed Clinical Social Worker

## 2019-05-27 ENCOUNTER — Telehealth: Payer: Self-pay | Admitting: Licensed Clinical Social Worker

## 2019-05-27 NOTE — Addendum Note (Signed)
Addended by: Yvonna Alanis E on: 05/27/2019 03:00 PM   Modules accepted: Orders

## 2019-05-27 NOTE — Telephone Encounter (Signed)
Patient was called to follow up on the referral from her PCP. Patient reported that she no longer needs the requested services, and understands that she can request a referral in the future if she needs to.

## 2019-05-29 ENCOUNTER — Encounter: Payer: PRIVATE HEALTH INSURANCE | Admitting: Internal Medicine

## 2019-06-24 ENCOUNTER — Telehealth: Payer: Self-pay | Admitting: Internal Medicine

## 2019-06-24 NOTE — Telephone Encounter (Signed)
Pt is requesting a nurse call; pt is in pain [phone]

## 2019-06-24 NOTE — Telephone Encounter (Signed)
Pt calls to state for 1 month+ she has been having increased pain, it is extremely worse, appt changed from 8/20 to 8/6

## 2019-06-26 ENCOUNTER — Encounter: Payer: PRIVATE HEALTH INSURANCE | Admitting: Internal Medicine

## 2019-07-03 ENCOUNTER — Ambulatory Visit (INDEPENDENT_AMBULATORY_CARE_PROVIDER_SITE_OTHER): Payer: PRIVATE HEALTH INSURANCE | Admitting: Internal Medicine

## 2019-07-03 ENCOUNTER — Encounter: Payer: Self-pay | Admitting: Internal Medicine

## 2019-07-03 ENCOUNTER — Other Ambulatory Visit: Payer: Self-pay

## 2019-07-03 DIAGNOSIS — Z79899 Other long term (current) drug therapy: Secondary | ICD-10-CM | POA: Diagnosis not present

## 2019-07-03 DIAGNOSIS — M797 Fibromyalgia: Secondary | ICD-10-CM | POA: Diagnosis not present

## 2019-07-03 DIAGNOSIS — G4733 Obstructive sleep apnea (adult) (pediatric): Secondary | ICD-10-CM

## 2019-07-03 MED ORDER — GABAPENTIN 300 MG PO CAPS: 600.0000 mg | ORAL_CAPSULE | Freq: Every day | ORAL | 0 refills | Status: DC

## 2019-07-03 MED ORDER — GABAPENTIN 300 MG PO CAPS: 300.0000 mg | ORAL_CAPSULE | Freq: Every day | ORAL | 0 refills | Status: DC

## 2019-07-03 NOTE — Progress Notes (Signed)
  [facility] Residency Telephone Encounter Continuity Care Appointment  HPI:   This telephone encounter was created for [name] on 07/03/2019 for the following purpose/cc muscle pain.   Past Medical History:  Past Medical History:  Diagnosis Date  . Asthma    Required inhaled corticosteroid in past.   . Depression, major 02/25/2013   First episode that required was 2014. Prior episodes that did not require pt to seek medical tx.  . Hypertension    2 drug therapy  . Low back pain 08/10/2016  . Nausea 09/19/2018  . OSA (obstructive sleep apnea)    Sleep study 12/2006 : Moderately Severe OSA. AHI 38.8. O2 sat decreased to 71%.  . Severe obesity (BMI >= 40) (HCC) 10/08/2012   Obesity Class 3. BMI > 40.   . Strain of flexor muscle of right hip 09/19/2018  . Tobacco abuse       ROS:      Assessment / Plan / Recommendations:   Please see A&P under problem oriented charting for assessment of the patient's acute and chronic medical conditions.   As always, pt is advised that if symptoms worsen or new symptoms arise, they should go to an urgent care facility or to to ER for further evaluation.   Consent and Medical Decision Making:   Patient discussed with Dr. Evette Doffing  This is a telephone encounter between Crystal English and [facility] on 07/03/2019 for fibromyalgia and OSA. The visit was conducted with the patient located at [employer] at [facility]. The patient's identity was confirmed using their DOB and current address. The patient has consented to being evaluated through a telephone encounter and understands the associated risks (an examination cannot be done and the patient may need to come in for an appointment) / benefits (allows the patient to remain at home, decreasing exposure to coronavirus). I personally spent 22 minutes on medical discussion.

## 2019-07-03 NOTE — Assessment & Plan Note (Signed)
HPI:  Patient reports increasing daytime sleepiness. She often falls asleep when in quiet places. She had a sleep study in 10/19 that illustrated moderate obstructive sleep apnea. She was scheduled for CPAP titration study however with the pandemic it was canceled. She is not been notified about rescheduling.  A/P: - Referral for CPAP titration study sent

## 2019-07-03 NOTE — Assessment & Plan Note (Signed)
HPI:  Patient called to discuss diffuse bodyaches. She states that originally started around her right elbow. She was seen in the clinic on 6/11 and diagnosed with lateral epicondylitis. She states that since that time her pain has become more widespread and is now involving the bilateral shoulders, back, and hips. She feels like it is similar to prior fibromyalgia flavors. In addition to her muscle pain she has noticed worsening daytime fatigue and sleepiness. She states that she will often fall asleep whenever she is in her quiet room or the passenger in a vehicle. She is not noticed any changes in her sleep pattern. She denies any fevers, chills, rash, unexplained weight loss, sore throat, lumps/bumps, cough. Her blood sugars have been well controlled and range from 110 to 130.  A/P: - No red flag symptoms at this point. - Continue Cymbalta 60 mg daily - Will start Gabapentin 300 mg QHS  - Referral for CPAP titration study

## 2019-07-04 NOTE — Progress Notes (Signed)
Internal Medicine Clinic Attending  Case discussed with Dr. Helberg at the time of the visit.  We reviewed the resident's history and exam and pertinent patient test results.  I agree with the assessment, diagnosis, and plan of care documented in the resident's note.    

## 2019-07-10 ENCOUNTER — Encounter: Payer: PRIVATE HEALTH INSURANCE | Admitting: Internal Medicine

## 2019-07-16 ENCOUNTER — Other Ambulatory Visit (HOSPITAL_COMMUNITY)
Admission: RE | Admit: 2019-07-16 | Discharge: 2019-07-16 | Disposition: A | Discharge status: Home / Self Care | Payer: PRIVATE HEALTH INSURANCE | Source: Ambulatory Visit | Attending: Internal Medicine | Admitting: Internal Medicine

## 2019-07-16 DIAGNOSIS — Z01812 Encounter for preprocedural laboratory examination: Secondary | ICD-10-CM | POA: Insufficient documentation

## 2019-07-16 DIAGNOSIS — Z20828 Contact with and (suspected) exposure to other viral communicable diseases: Secondary | ICD-10-CM | POA: Insufficient documentation

## 2019-07-16 LAB — SARS CORONAVIRUS 2 (TAT 6-24 HRS): SARS Coronavirus 2: NEGATIVE

## 2019-07-18 ENCOUNTER — Other Ambulatory Visit: Payer: Self-pay

## 2019-07-18 ENCOUNTER — Ambulatory Visit (HOSPITAL_BASED_OUTPATIENT_CLINIC_OR_DEPARTMENT_OTHER): Payer: PRIVATE HEALTH INSURANCE | Attending: Internal Medicine | Admitting: Internal Medicine

## 2019-07-18 VITALS — Ht 61.0 in | Wt 220.0 lb

## 2019-07-18 DIAGNOSIS — Z79899 Other long term (current) drug therapy: Secondary | ICD-10-CM | POA: Diagnosis not present

## 2019-07-18 DIAGNOSIS — Z7984 Long term (current) use of oral hypoglycemic drugs: Secondary | ICD-10-CM | POA: Insufficient documentation

## 2019-07-18 DIAGNOSIS — G4733 Obstructive sleep apnea (adult) (pediatric): Secondary | ICD-10-CM

## 2019-07-21 ENCOUNTER — Other Ambulatory Visit: Payer: Self-pay

## 2019-07-26 DIAGNOSIS — G4733 Obstructive sleep apnea (adult) (pediatric): Secondary | ICD-10-CM | POA: Diagnosis not present

## 2019-07-26 NOTE — Procedures (Signed)
Patient Name: Crystal English, Crystal English Date: 07/18/2019 Gender: Female D.O.B: 03/01/1966 Age (years): 89 Referring Provider: Oda Kilts Height (inches): 61 Interpreting Physician: Baird Lyons MD, ABSM Weight (lbs): 220 RPSGT: Baxter Flattery BMI: 42 MRN: [id number] Neck Size: 16.00  CLINICAL INFORMATION The patient is referred for a CPAP titration to treat sleep apnea.  Date of NPSG, Split Night or HST: NPSG 09/06/18  AHI 15.2/ hr, desaturation to 63%, bodyweight 230 lbs  SLEEP STUDY TECHNIQUE As per the AASM Manual for the Scoring of Sleep and Associated Events v2.3 (April 2016) with a hypopnea requiring 4% desaturations.  The channels recorded and monitored were frontal, central and occipital EEG, electrooculogram (EOG), submentalis EMG (chin), nasal and oral airflow, thoracic and abdominal wall motion, anterior tibialis EMG, snore microphone, electrocardiogram, and pulse oximetry. Continuous positive airway pressure (CPAP) was initiated at the beginning of the study and titrated to treat sleep-disordered breathing.  MEDICATIONS Medications self-administered by patient taken the night of the study : [place], GABAPENTIN, [place], [place] Comments added by technician: Patient had difficulty initiating sleep. Comments added by scorer: N/A RESPIRATORY PARAMETERS Optimal PAP Pressure (cm): 16 AHI at Optimal Pressure (/hr): 33.7 Overall Minimal O2 (%): 80.0 Supine % at Optimal Pressure (%): 0 Minimal O2 at Optimal Pressure (%): 87.0   SLEEP ARCHITECTURE The study was initiated at 11:02:38 PM and ended at 5:08:50 AM.  Sleep onset time was 9.8 minutes and the sleep efficiency was 85.4%%. The total sleep time was 312.9 minutes.  The patient spent 4.0%% of the night in stage N1 sleep, 96.0%% in stage N2 sleep, 0.0%% in stage N3 and 0% in REM.Stage REM latency was N/A minutes  Wake after sleep onset was 43.5. Alpha intrusion was absent. Supine  sleep was 33.28%.  CARDIAC DATA The 2 lead EKG demonstrated sinus rhythm. The mean heart rate was 96.9 beats per minute. Other EKG findings include: None.  LEG MOVEMENT DATA The total Periodic Limb Movements of Sleep (PLMS) were 0. The PLMS index was 0.0. A PLMS index of <15 is considered normal in adults.  IMPRESSIONS - CPAP was titrated to pressure 16 cm of water in time available. Clear control of apneas was not obtained. - Central sleep apnea was not noted during this titration (CAI = 1.7/h). - Oxygen desaturations were observed during this titration (min O2 = 80.0%). Mean sat at CPAP 16 was 92.4%. - No snoring was audible during this study. - Sustained sinus tachycardia was noted with HR 99/ minute. - Clinically significant periodic limb movements were not noted during this study.   DIAGNOSIS - Obstructive Sleep Apnea (327.23 [G47.33 ICD-10])  RECOMMENDATIONS - Suggest trial of CPAP therapy  using autopap 5-20 cwp. - Patient used a Medium size Resmed Full Face Mask AirFit F20 mask and heated humidification. - Be careful with alcohol, sedatives and other CNS depressants that may worsen sleep apnea and disrupt normal sleep architecture. - Sleep hygiene should be reviewed to assess factors that may improve sleep quality. - Weight management and regular exercise should be initiated or continued.  [Electronically signed] 07/26/2019 12:32 PM  Baird Lyons MD, [place], American Board of Sleep Medicine   NPI: [id number]                         [place], [place] [facility]  ELECTRONICALLY SIGNED ON:  07/26/2019, 12:26 PM [place] PH: [phone]   FX: [phone])  [place] OF [facility]ICINE

## 2019-07-29 NOTE — Progress Notes (Signed)
Reviewed recent CPAP titration study. Placed DME order for CPAP using autopap 5-20 cwp, Medium size Resmed Full Face Mask AirFit F20 mask and heated humidification.

## 2019-07-29 NOTE — Addendum Note (Signed)
Addended by: Jodean Lima on: 07/29/2019 08:52 AM   Modules accepted: Orders

## 2019-08-12 ENCOUNTER — Telehealth: Payer: Self-pay

## 2019-08-12 NOTE — Telephone Encounter (Signed)
Requesting to speak with nurse about getting cpap orders. Please call pt back.

## 2019-08-15 ENCOUNTER — Telehealth: Payer: Self-pay

## 2019-08-15 NOTE — Telephone Encounter (Signed)
I am returning a phone call to patient about Crystal English, she has never used any company before so I will send a message to [place] they will get in touch with the patient.

## 2019-08-18 NOTE — Telephone Encounter (Signed)
Received: 3 days ago Message Contents  Sheila Oats, Marcial Pacas, [place], [place]; [place], Dellrose L; [place], Stanford Breed, Melissa        Got it [place]. Thank you.   Previous Messages  ----- Message -----  From: Judge Stall, [place]  Sent: 08/15/2019 11:00 AM EDT  To: Darlina Guys, [place] Alas, *  Subject: DME for C-Pap machine               Good Friday morning, Patient has order in for c-pap supplies, sleep study done on 09-06-2018 and 2 half on 8-20 -20. She has never had a Cpap machine before [place], [place] C9/25/202011:03 AM

## 2019-08-19 ENCOUNTER — Telehealth: Payer: Self-pay | Admitting: Internal Medicine

## 2019-08-19 NOTE — Telephone Encounter (Signed)
Looks like we only need an A1c. We can do this as a POC when she arrives. Thank you.   Larkin Ina

## 2019-08-19 NOTE — Telephone Encounter (Signed)
In error

## 2019-08-19 NOTE — Telephone Encounter (Signed)
Pt would like the physician to put in any labs, so she can come before her appt so they will be ready [phone]

## 2019-08-25 NOTE — Telephone Encounter (Signed)
Pt is calling again regarding her CPAP [phone]

## 2019-08-28 ENCOUNTER — Other Ambulatory Visit: Payer: Self-pay

## 2019-08-28 ENCOUNTER — Telehealth: Payer: Self-pay | Admitting: Internal Medicine

## 2019-08-28 ENCOUNTER — Encounter: Payer: Self-pay | Admitting: Internal Medicine

## 2019-08-28 ENCOUNTER — Ambulatory Visit (INDEPENDENT_AMBULATORY_CARE_PROVIDER_SITE_OTHER): Payer: PRIVATE HEALTH INSURANCE | Admitting: Internal Medicine

## 2019-08-28 VITALS — BP 125/81 | HR 97 | Temp 98.2°F | Ht 61.0 in | Wt 230.6 lb

## 2019-08-28 DIAGNOSIS — I10 Essential (primary) hypertension: Secondary | ICD-10-CM | POA: Diagnosis not present

## 2019-08-28 DIAGNOSIS — E119 Type 2 diabetes mellitus without complications: Secondary | ICD-10-CM

## 2019-08-28 DIAGNOSIS — Z7984 Long term (current) use of oral hypoglycemic drugs: Secondary | ICD-10-CM

## 2019-08-28 DIAGNOSIS — G4733 Obstructive sleep apnea (adult) (pediatric): Secondary | ICD-10-CM

## 2019-08-28 DIAGNOSIS — Z6841 Body Mass Index (BMI) 40.0 and over, adult: Secondary | ICD-10-CM

## 2019-08-28 DIAGNOSIS — E041 Nontoxic single thyroid nodule: Secondary | ICD-10-CM

## 2019-08-28 DIAGNOSIS — M797 Fibromyalgia: Secondary | ICD-10-CM

## 2019-08-28 LAB — POCT GLYCOSYLATED HEMOGLOBIN (HGB A1C): Hemoglobin A1C: 7.6 % — AB (ref 4.0–5.6)

## 2019-08-28 LAB — GLUCOSE, CAPILLARY: Glucose-Capillary: 102 mg/dL — ABNORMAL HIGH (ref 70–99)

## 2019-08-28 MED ORDER — METFORMIN HCL ER (MOD) 1000 MG PO TB24: 1000.0000 mg | ORAL_TABLET | Freq: Two times a day (BID) | ORAL | 3 refills | Status: DC

## 2019-08-28 NOTE — Telephone Encounter (Signed)
In error

## 2019-08-28 NOTE — Patient Instructions (Addendum)
Thank you for allowing me to provide your care. Keep up the great work with your diabetes! Start exercising daily and continue working on weight loss.   Goal weight at next visit: 220lbs   Please come back to see me in 3 months or sooner if any issues arise.

## 2019-08-28 NOTE — Progress Notes (Signed)
   CC: DM, Obesity, OSA   HPI:  Crystal English is a 53 y.o. female with PMHx listed below presenting for DM, Obesity, OSA. Please see the A&P for the status of the patient's chronic medical problems.  Past Medical History:  Diagnosis Date  . Asthma    Required inhaled corticosteroid in past.   . Depression, major 02/25/2013   First episode that required was 2014. Prior episodes that did not require pt to seek medical tx.  . Hypertension    2 drug therapy  . Low back pain 08/10/2016  . Nausea 09/19/2018  . OSA (obstructive sleep apnea)    Sleep study 12/2006 : Moderately Severe OSA. AHI 38.8. O2 sat decreased to 71%.  . Severe obesity (BMI >= 40) (HCC) 10/08/2012   Obesity Class 3. BMI > 40.   . Strain of flexor muscle of right hip 09/19/2018  . Tobacco abuse    Review of Systems:  Performed and all others negative.  Physical Exam: Vitals:   08/28/19 1552  BP: 125/81  Pulse: 97  Temp: 98.2 F (36.8 C)  TempSrc: Oral  SpO2: 98%  Weight: 230 lb 9.6 oz (104.6 kg)  Height: 5\' 1"  (1.549 m)   General: Obese female in no acute distress Pulm: Good air movement with no wheezing or crackles  CV: RRR, no murmurs, no rubs   Assessment & Plan:   See Encounters Tab for problem based charting.  Patient discussed with Dr. Daryll Drown

## 2019-08-29 NOTE — Assessment & Plan Note (Signed)
HPI:  Patient with known OSA. She completed his sleep study earlier this year. She is still waiting on her CPAP machine. She is not been contacted by the DME company.  A/P: - Will look into CPAP machine

## 2019-08-29 NOTE — Assessment & Plan Note (Signed)
HPI:  Patient with severe obesity with a BMI greater than 40 and related comorbidities including OSA, diabetes, and hypertension. She is interested in weight loss. Her weight has increased 10 pounds since her last visit. She is trying to modify her diet. She states that she is going to start exercising.  A/P: - Discussed that given her BMI and related comorbidities she would qualify for weight loss surgery. We discussed lifestyle modifications, medications that can assist in weight loss, and bariatric surgery. She is interested in bariatric surgery as she has had relatives that have had success with it. She is going to go to an information session with [place] [facility].

## 2019-08-29 NOTE — Assessment & Plan Note (Signed)
HPI:  Patient with uncontrolled diabetes. She is currently on Januvia 100 mg daily and metformin 1000 mg twice daily. She states that she has changed her diet and is trying to eliminate suites. She does still occasionally have suites. She has not start exercising again. She is gained 10 pounds since her last visit. She denies signs or symptoms of hypoglycemia. Her previous symptoms associated with hyperglycemia have resolved. She does not have her meter today.  A/P: - A1c significantly improved from greater than 14 down to 7.6. Continue Januvia 100 mg daily and metformin 1000 mg twice daily - Continue lisinopril - She will get her flu shot through her employer - Needs statin therapy - Up today on a eye / foot exam

## 2019-09-02 NOTE — Progress Notes (Signed)
Internal Medicine Clinic Attending  Case discussed with Dr. Helberg  soon after the resident saw the patient.  We reviewed the resident's history and exam and pertinent patient test results.  I agree with the assessment, diagnosis, and plan of care documented in the resident's note.  

## 2019-09-08 ENCOUNTER — Encounter: Payer: PRIVATE HEALTH INSURANCE | Admitting: Obstetrics and Gynecology

## 2019-09-19 ENCOUNTER — Ambulatory Visit: Payer: PRIVATE HEALTH INSURANCE

## 2019-09-22 ENCOUNTER — Other Ambulatory Visit: Payer: Self-pay

## 2019-09-22 ENCOUNTER — Ambulatory Visit: Payer: PRIVATE HEALTH INSURANCE | Admitting: Internal Medicine

## 2019-09-22 DIAGNOSIS — G4733 Obstructive sleep apnea (adult) (pediatric): Secondary | ICD-10-CM | POA: Diagnosis not present

## 2019-09-22 DIAGNOSIS — E119 Type 2 diabetes mellitus without complications: Secondary | ICD-10-CM

## 2019-09-22 DIAGNOSIS — M79673 Pain in unspecified foot: Secondary | ICD-10-CM | POA: Diagnosis not present

## 2019-09-22 DIAGNOSIS — W25XXXA Contact with sharp glass, initial encounter: Secondary | ICD-10-CM | POA: Insufficient documentation

## 2019-09-22 DIAGNOSIS — I10 Essential (primary) hypertension: Secondary | ICD-10-CM

## 2019-09-22 DIAGNOSIS — E669 Obesity, unspecified: Secondary | ICD-10-CM

## 2019-09-22 DIAGNOSIS — Z6841 Body Mass Index (BMI) 40.0 and over, adult: Secondary | ICD-10-CM

## 2019-09-22 NOTE — Patient Instructions (Addendum)
Thank you for allowing [place] to provide your care today. Today we discussed your foot pain.    Glass shard in your foot was removed  Please follow-up in 3 months with Dr.Helberg  Should you have any questions or concerns please call the internal medicine clinic at [phone].      Foot Care, Adult Foot care is an important part of your health. Noticing and addressing any potential problems early is the best way to prevent future foot problems. How to care for your [place] your feet daily with warm water and mild soap. Do not use hot water. Then, pat your feet and the areas between your toes until they are completely dry. Do not soak your feet as this can dry your skin.  Trim your toenails straight across. Do not dig under them or around the cuticle. File the edges of your nails with an emery board or nail file.  On the skin on your feet and on dry, brittle nails, apply a moisturizing lotion or petroleum jelly that is unscented and does not contain alcohol. Do not apply lotion between your toes. Shoes and Socks   Wear clean socks or stockings every day. Make sure they are not too tight.  Wear shoes that fit properly and have enough cushioning. To break in new shoes, wear them for just a few hours a day. This prevents you from injuring your feet. Always look in your shoes before you put them on to be sure there are no objects inside. Wounds, Scrapes, Corns, and Calluses  Check your feet daily for blisters, cuts, and redness. If you cannot see the bottom of your feet, use a mirror or ask someone for help.  Do not cut corns or calluses. Do not try to remove them with medicine.  If you find a minor scrape, cut, or break in the skin on your feet, keep it and the skin around it clean and dry. These areas may be cleaned with mild soap and water. Do not clean the area with peroxide, alcohol, or iodine.  If you have a wound, scrape, corn, or callus on your foot, look at it  several times a day to make sure it is healing and is not infected. Check for: ? More redness, swelling, or pain. ? More fluid or blood. ? Warmth. ? Pus or a bad smell. General Instructions  Do not cross your legs. That may decrease the blood flow to your feet.  Do not use heating pads or hot water bottles on your feet. They may burn your skin. If you have lost feeling in your feet or legs, you may not know it is happening until it is too late.  Make sure your health care provider does a complete foot exam at least annually or more often if you have foot problems. If you have foot problems, report any cuts, sores, or bruises to your health care provider immediately. Contact a health care provider if:  You have a medical condition that increases your risk of infection and you have any cuts, sores, or bruises on your feet.  You have an injury that is not healing.  You notice redness on your legs or feet.  You feel burning or tingling in your legs or feet.  You have pain or cramps in your legs or feet.  Your legs or feet are numb.  Your feet always feel cold.  You have pain around a toenail. Get help right away if:  You have a wound, scrape, corn, or callus on your foot and: ? You have more redness, swelling, or pain. ? You have more fluid or blood. ? Your wound, scrape, corn, or callus feels warm to the touch. ? You have pus or a bad smell coming from the wound, scrape, corn, or callus. ? You have a fever.  You have a red line going up your leg. This information is not intended to replace advice given to you by your health care provider. Make sure you discuss any questions you have with your health care provider. Document Released: 04/14/2016 Document Revised: 11/23/2016 Document Reviewed: 04/14/2016 Elsevier Patient Education  2020 [employer].

## 2019-09-22 NOTE — Assessment & Plan Note (Signed)
Presents with acute onset foot pain with focal tenderness after treading on carpeted area with known broken glass. On inspection, no obvious foreign body but clear area of bleed. Area was prepped with iodine swab and dissected with forceps revealing ~20mm length glass shard embedded in her foot. Upon removal, [name] experienced immediate relief. Area was covered with clean bandage. Discussed importance of monitoring foot wounds for good healing.  - Glass shard removed in office - Wound care instructions provided

## 2019-09-22 NOTE — Progress Notes (Addendum)
   CC: Foot pain  HPI: [name] is a 53 y.o. F w/ PMH of T2DM, HTN, OSA and obesity who presents to the [facility] w/ complaint of foot pain. She was in her usual state of health until Friday night when she experienced acute onset sharp pain on bottom of her foot. She mentions that she had a large glass bottle that broke on carpeted area couple months ago and she believed she removed all the glass shards in that area before but states she feels 'something is stuck there.' She mentions that pain is exacerbated by weight bearing and alleviated with elevation.  Past Medical History:  Diagnosis Date  . Asthma    Required inhaled corticosteroid in past.   . Depression, major 02/25/2013   First episode that required was 2014. Prior episodes that did not require pt to seek medical tx.  . Hypertension    2 drug therapy  . Low back pain 08/10/2016  . Nausea 09/19/2018  . OSA (obstructive sleep apnea)    Sleep study 12/2006 : Moderately Severe OSA. AHI 38.8. O2 sat decreased to 71%.  . Severe obesity (BMI >= 40) (HCC) 10/08/2012   Obesity Class 3. BMI > 40.   . Strain of flexor muscle of right hip 09/19/2018  . Tobacco abuse    Review of Systems: Review of Systems  Constitutional: Negative for chills, fever and malaise/fatigue.  Cardiovascular: Negative for leg swelling.  Gastrointestinal: Negative for nausea and vomiting.  Neurological: Negative for dizziness and headaches.     Physical Exam: Vitals:   09/22/19 1054  BP: (!) 155/97  Pulse: 90  Temp: 98.8 F (37.1 C)  TempSrc: Oral  SpO2: 99%  Weight: 236 lb 8 oz (107.3 kg)    Physical Exam  Constitutional: She appears well-developed.  Obese  Cardiovascular: Normal rate, regular rhythm, normal heart sounds and intact distal pulses.  No murmur heard. Respiratory: Effort normal and breath sounds normal. She has no wheezes. She has no rales.  Musculoskeletal: Normal range of motion.        General: Tenderness (Focal area of  tenderness on plantar surface of the foot with tiny area of ecchymosis) present.    Assessment & Plan:   Injury from broken glass Presents with acute onset foot pain with focal tenderness after treading on carpeted area with known broken glass. On inspection, no obvious foreign body but clear area of bleed. Area was prepped with iodine swab and dissected with forceps revealing ~75mm length glass shard embedded in her foot. Upon removal, Crystal English experienced immediate relief. Area was covered with clean bandage. Discussed importance of monitoring foot wounds for good healing.  - Glass shard removed in office - Wound care instructions provided    Patient discussed with Dr. Lynnae January   -Gilberto Better, [place] [facility] Pager: [phone]

## 2019-09-23 NOTE — Progress Notes (Signed)
Internal Medicine Clinic Attending ° °Case discussed with Dr. Lee at the time of the visit.  We reviewed the resident’s history and exam and pertinent patient test results.  I agree with the assessment, diagnosis, and plan of care documented in the resident’s note.  °

## 2019-10-01 ENCOUNTER — Ambulatory Visit (INDEPENDENT_AMBULATORY_CARE_PROVIDER_SITE_OTHER): Payer: PRIVATE HEALTH INSURANCE | Admitting: Internal Medicine

## 2019-10-01 ENCOUNTER — Other Ambulatory Visit: Payer: Self-pay

## 2019-10-01 ENCOUNTER — Encounter: Payer: Self-pay | Admitting: Internal Medicine

## 2019-10-01 VITALS — BP 132/96 | HR 101 | Temp 98.0°F | Ht 61.0 in | Wt 233.7 lb

## 2019-10-01 DIAGNOSIS — F339 Major depressive disorder, recurrent, unspecified: Secondary | ICD-10-CM

## 2019-10-01 DIAGNOSIS — I10 Essential (primary) hypertension: Secondary | ICD-10-CM

## 2019-10-01 DIAGNOSIS — M7062 Trochanteric bursitis, left hip: Secondary | ICD-10-CM | POA: Diagnosis not present

## 2019-10-01 DIAGNOSIS — M5010 Cervical disc disorder with radiculopathy, unspecified cervical region: Secondary | ICD-10-CM

## 2019-10-01 DIAGNOSIS — E669 Obesity, unspecified: Secondary | ICD-10-CM

## 2019-10-01 DIAGNOSIS — Z6841 Body Mass Index (BMI) 40.0 and over, adult: Secondary | ICD-10-CM

## 2019-10-01 MED ORDER — IBUPROFEN 800 MG PO TABS: 800.0000 mg | ORAL_TABLET | Freq: Three times a day (TID) | ORAL | 0 refills | Status: DC | PRN

## 2019-10-01 NOTE — Patient Instructions (Signed)
Dear [name],  Thank you for allowing [place] to provide your care today. Today we discussed your hip pain    I have ordered no labs for you. I will call if any are abnormal.    Today we made the following changes to your medications:    Continue to take ibuprofen 800mg  as needed  Please follow-up if your pain worsens.    Should you have any questions or concerns please call the internal medicine clinic at [phone].    Thank you for choosing [facility].   Hip Bursitis  Hip bursitis is swelling of a fluid-filled sac (bursa) in your hip joint. This swelling (inflammation) can be painful. This condition may come and go over time. What are the causes?  Injury to the hip.  Overuse of the muscles that surround the hip joint.  An earlier injury or surgery of the hip.  Arthritis or gout.  Diabetes.  Thyroid disease.  Infection.  In some cases, the cause may not be known. What are the signs or symptoms?  Mild or moderate pain in the hip area. Pain may get worse with movement.  Tenderness and swelling of the hip, especially on the outer side of the hip.  In rare cases, the bursa may become infected. This may cause: ? A fever. ? Warmth and redness in the area. Symptoms may come and go. How is this treated? This condition is treated by resting, icing, applying pressure (compression), and raising (elevating) the injured area. You may hear this called the RICE treatment. Treatment may also include:  Using crutches.  Draining fluid out of the bursa to help relieve swelling.  Giving a shot of (injecting) medicine that helps to reduce swelling (cortisone).  Other medicines if the bursa is infected. Follow these instructions at home: Managing pain, stiffness, and swelling   If told, put ice on the painful area. ? Put ice in a plastic bag. ? Place a towel between your skin and the bag. ? Leave the ice on for 20 minutes, 2-3 times a day. ? Raise (elevate)  your hip above the level of your heart as much as you can without pain. To do this, try putting a pillow under your hips while you lie down. Stop if this causes pain. Activity  Return to your normal activities as told by your doctor. Ask your doctor what activities are safe for you.  Rest and protect your hip as much as you can until you feel better. General instructions  Take over-the-counter and prescription medicines only as told by your doctor.  Wear wraps that put pressure on your hip (compression wraps) only as told by your doctor.  Do not use your hip to support your body weight until your doctor says that you can.  Use crutches as told by your doctor.  Gently rub and stretch your injured area as often as is comfortable.  Keep all follow-up visits as told by your doctor. This is important. How is this prevented?  Exercise regularly, as told by your doctor.  Warm up and stretch before being active.  Cool down and stretch after being active.  Avoid activities that bother your hip or cause pain.  Avoid sitting down for long periods at a time. Contact a doctor if:  You have a fever.  You get new symptoms.  You have trouble walking.  You have trouble doing everyday activities.  You have pain that gets worse.  You have pain that does not get  better with medicine.  You get red skin on your hip area.  You get a feeling of warmth in your hip area. Get help right away if:  You cannot move your hip.  You have very bad pain. Summary  Hip bursitis is swelling of a fluid-filled sac (bursa) in your hip.  Hip bursitis can be painful.  Symptoms often come and go over time.  This condition is treated with rest, ice, compression, elevation, and medicines. This information is not intended to replace advice given to you by your health care provider. Make sure you discuss any questions you have with your health care provider. Document Released: 12/09/2010 Document  Revised: 07/15/2018 Document Reviewed: 07/15/2018 Elsevier Patient Education  2020 [employer].    Hip Bursitis Rehab Ask your health care provider which exercises are safe for you. Do exercises exactly as told by your health care provider and adjust them as directed. It is normal to feel mild stretching, pulling, tightness, or discomfort as you do these exercises. Stop right away if you feel sudden pain or your pain gets worse. Do not begin these exercises until told by your health care provider. Stretching exercise This exercise warms up your muscles and joints and improves the movement and flexibility of your hip. This exercise also helps to relieve pain and stiffness. Iliotibial band stretch An iliotibial band is a strong band of muscle tissue that runs from the outer side of your hip to the outer side of your thigh and knee. 1. Lie on your side with your left / right leg in the top position. 2. Bend your left / right knee and grab your ankle. Stretch out your bottom arm to help you balance. 3. Slowly bring your knee back so your thigh is behind your body. 4. Slowly lower your knee toward the floor until you feel a gentle stretch on the outside of your left / right thigh. If you do not feel a stretch and your knee will not fall farther, place the heel of your other foot on top of your knee and pull your knee down toward the floor with your foot. 5. Hold this position for __________ seconds. 6. Slowly return to the starting position. Repeat __________ times. Complete this exercise __________ times a day. Strengthening exercises These exercises build strength and endurance in your hip and pelvis. Endurance is the ability to use your muscles for a long time, even after they get tired. Bridge This exercise strengthens the muscles that move your thigh backward (hip extensors). 1. Lie on your back on a firm surface with your knees bent and your feet flat on the floor. 2. Tighten your buttocks  muscles and lift your buttocks off the floor until your trunk is level with your thighs. ? Do not arch your back. ? You should feel the muscles working in your buttocks and the back of your thighs. If you do not feel these muscles, slide your feet 1-2 inches (2.5-5 cm) farther away from your buttocks. ? If this exercise is too easy, try doing it with your arms crossed over your chest. 3. Hold this position for __________ seconds. 4. Slowly lower your hips to the starting position. 5. Let your muscles relax completely after each repetition. Repeat __________ times. Complete this exercise __________ times a day. Squats This exercise strengthens the muscles in front of your thigh and knee (quadriceps). 1. Stand in front of a table, with your feet and knees pointing straight ahead. You may rest your  hands on the table for balance but not for support. 2. Slowly bend your knees and lower your hips like you are going to sit in a chair. ? Keep your weight over your heels, not over your toes. ? Keep your lower legs upright so they are parallel with the table legs. ? Do not let your hips go lower than your knees. ? Do not bend lower than told by your health care provider. ? If your hip pain increases, do not bend as low. 3. Hold the squat position for __________ seconds. 4. Slowly push with your legs to return to standing. Do not use your hands to pull yourself to standing. Repeat __________ times. Complete this exercise __________ times a day. Hip hike 1. Stand sideways on a bottom step. Stand on your left / right leg with your other foot unsupported next to the step. You can hold on to the railing or wall for balance if needed. 2. Keep your knees straight and your torso square. Then lift your left / right hip up toward the ceiling. 3. Hold this position for __________ seconds. 4. Slowly let your left / right hip lower toward the floor, past the starting position. Your foot should get closer to the  floor. Do not lean or bend your knees. Repeat __________ times. Complete this exercise __________ times a day. Single leg stand 1. Without shoes, stand near a railing or in a doorway. You may hold on to the railing or door frame as needed for balance. 2. Squeeze your left / right buttock muscles, then lift up your other foot. ? Do not let your left / right hip push out to the side. ? It is helpful to stand in front of a mirror for this exercise so you can watch your hip. 3. Hold this position for __________ seconds. Repeat __________ times. Complete this exercise __________ times a day. This information is not intended to replace advice given to you by your health care provider. Make sure you discuss any questions you have with your health care provider. Document Released: 12/14/2004 Document Revised: 03/03/2019 Document Reviewed: 03/03/2019 Elsevier Patient Education  2020 [employer].

## 2019-10-01 NOTE — Assessment & Plan Note (Signed)
Presents with progressive left hip pain of 5 day duration. Exacerbated by exertion, alleviated by rest, NSAIDs. Denies prior hx. Family hx of osteoarthritis. Currently endorsing pain. Physical exam consistent with trochanteric bursitis. Discussed c/w treatment with NSAIDs PRN + Rest, Ice, Compression, Elevation. Also provided instructions on stretching exercises.   - C/w RICE, Ibuprofen Prn - Stretching exercise instructions provided - Discussed option of steroid injection if pain worsens

## 2019-10-01 NOTE — Progress Notes (Signed)
   CC: Hip pain  HPI: [name] is a 53 y.o. F w/ PMh of obesity, depression, HTN who presents with complaints of hip pain. She was in her usual state of health until about 5 days ago when she started having progressively worsening hip pain that occurred without obvious inciting event. Described as 8/10 soreness. Exacerbated by exercise, improved with ibuprofen and rest. Mentions that pain is severe enough to interfere with mobility and those around her encouraged her to visit the doctor. She asks if it is due to arthritis.  Past Medical History:  Diagnosis Date  . Asthma    Required inhaled corticosteroid in past.   . Depression, major 02/25/2013   First episode that required was 2014. Prior episodes that did not require pt to seek medical tx.  . Hypertension    2 drug therapy  . Low back pain 08/10/2016  . Nausea 09/19/2018  . OSA (obstructive sleep apnea)    Sleep study 12/2006 : Moderately Severe OSA. AHI 38.8. O2 sat decreased to 71%.  . Severe obesity (BMI >= 40) (HCC) 10/08/2012   Obesity Class 3. BMI > 40.   . Strain of flexor muscle of right hip 09/19/2018  . Tobacco abuse    Review of Systems: Review of Systems  Constitutional: Negative for chills, fever and malaise/fatigue.  Respiratory: Negative for shortness of breath.   Cardiovascular: Negative for chest pain, palpitations and leg swelling.  Gastrointestinal: Negative for constipation, diarrhea, nausea and vomiting.  Musculoskeletal: Positive for back pain and joint pain.  Neurological: Negative for tingling, weakness and headaches.  All other systems reviewed and are negative.    Physical Exam: Vitals:   10/01/19 0956  BP: (!) 132/96  Pulse: (!) 101  Temp: 98 F (36.7 C)  TempSrc: Oral  SpO2: 97%  Weight: 233 lb 11.2 oz (106 kg)  Height: 5\' 1"  (1.549 m)    Physical Exam  Constitutional: She is oriented to person, place, and time. She appears well-developed and well-nourished. No distress.   HENT:  Mouth/Throat: Oropharynx is clear and moist.  Cardiovascular: Normal rate, regular rhythm, normal heart sounds and intact distal pulses.  No murmur heard. Respiratory: Effort normal and breath sounds normal. She has no wheezes. She has no rales.  GI: Soft. Bowel sounds are normal. She exhibits no distension. There is no abdominal tenderness.  Musculoskeletal: Normal range of motion.        General: Tenderness (Tenderness to deep palpation on lateral aspect of left hip) present. No edema.  Neurological: She is alert and oriented to person, place, and time.    Assessment & Plan:   Trochanteric bursitis of left hip Presents with progressive left hip pain of 5 day duration. Exacerbated by exertion, alleviated by rest, NSAIDs. Denies prior hx. Family hx of osteoarthritis. Currently endorsing pain. Physical exam consistent with trochanteric bursitis. Discussed c/w treatment with NSAIDs PRN + Rest, Ice, Compression, Elevation. Also provided instructions on stretching exercises.   - C/w RICE, Ibuprofen Prn - Stretching exercise instructions provided - Discussed option of steroid injection if pain worsens   Patient discussed with Dr. Dareen Piano   -Gilberto Better, PGY2 [facility] Pager: [phone]

## 2019-10-06 NOTE — Progress Notes (Signed)
Internal Medicine Clinic Attending ° °Case discussed with Dr. Lee at the time of the visit.  We reviewed the resident’s history and exam and pertinent patient test results.  I agree with the assessment, diagnosis, and plan of care documented in the resident’s note.  °

## 2019-10-29 ENCOUNTER — Telehealth: Payer: Self-pay | Admitting: *Deleted

## 2019-10-29 NOTE — Telephone Encounter (Signed)
Thank you :)

## 2019-10-29 NOTE — Telephone Encounter (Signed)
Call from pt - stated when leaving work, walking on the sidewalk, she felled. Stated she did not trip over anything;she caught herself with her hands -she scarred her knees,scraped her hands and maybe twisted her ankle. She did not hit her head. She has been noticing last month or so, she has been forgetful. Stated she watched the football game last night and could not remember who played. She works in [place]; drove herself home. But concern about what's going on. Appt scheduled tomorrow @ 0845 AM in [facility]- informed/aware to go to ER if condition/symptoms change.

## 2019-10-30 ENCOUNTER — Telehealth: Payer: Self-pay | Admitting: *Deleted

## 2019-10-30 ENCOUNTER — Ambulatory Visit (INDEPENDENT_AMBULATORY_CARE_PROVIDER_SITE_OTHER): Payer: PRIVATE HEALTH INSURANCE | Admitting: Internal Medicine

## 2019-10-30 ENCOUNTER — Encounter: Payer: Self-pay | Admitting: Internal Medicine

## 2019-10-30 ENCOUNTER — Other Ambulatory Visit: Payer: Self-pay

## 2019-10-30 VITALS — BP 174/110 | HR 83 | Wt 237.2 lb

## 2019-10-30 DIAGNOSIS — I10 Essential (primary) hypertension: Secondary | ICD-10-CM | POA: Diagnosis not present

## 2019-10-30 DIAGNOSIS — E119 Type 2 diabetes mellitus without complications: Secondary | ICD-10-CM

## 2019-10-30 DIAGNOSIS — M797 Fibromyalgia: Secondary | ICD-10-CM

## 2019-10-30 DIAGNOSIS — R42 Dizziness and giddiness: Secondary | ICD-10-CM

## 2019-10-30 DIAGNOSIS — R413 Other amnesia: Secondary | ICD-10-CM

## 2019-10-30 DIAGNOSIS — Z7984 Long term (current) use of oral hypoglycemic drugs: Secondary | ICD-10-CM

## 2019-10-30 DIAGNOSIS — Z79899 Other long term (current) drug therapy: Secondary | ICD-10-CM

## 2019-10-30 LAB — POCT GLYCOSYLATED HEMOGLOBIN (HGB A1C): Hemoglobin A1C: 7.4 % — AB (ref 4.0–5.6)

## 2019-10-30 LAB — GLUCOSE, CAPILLARY: Glucose-Capillary: 162 mg/dL — ABNORMAL HIGH (ref 70–99)

## 2019-10-30 MED ORDER — SEMAGLUTIDE(0.25 OR 0.5MG/DOS) 2 MG/1.5ML ~~LOC~~ SOPN: 0.2500 mg | PEN_INJECTOR | SUBCUTANEOUS | 3 refills | Status: DC

## 2019-10-30 NOTE — Assessment & Plan Note (Signed)
Patient presents with complaints of dizziness, memory impairment, and recent fall. At her last visit we started her on gabapentin for her fibromyalgia. Shortly after starting it is when she noticed her symptoms. She has not started any other new medications. She is not noticed any focal neural deficits. She is not had any fevers, chills, or other systemic signs of infection.  A/P: - We will stop the gabapentin as I think this is likely causing her symptoms - Continue duloxetine 60 mg once daily - Discussed the benefits of weight loss and exercise and fibromyalgia

## 2019-10-30 NOTE — Assessment & Plan Note (Signed)
Patient with type II diabetes. Currently on sitagliptin 100 mg daily and metformin 1000 mg twice daily. She has been taking his medications as prescribed. She is not been checking her blood sugar. She states that she continues to eat fast food and has not increased her exercise. She is not had any polyuria or polydipsia.  Repeat hemoglobin A1c today is 7.4.  A/P: - Discussed that weight loss would be beneficial for the control of her diabetes. She is interested to switch to a medication that could assist in this. We will start sitagliptin 0.25 mg weekly for the next four weeks with plans to increase to 0.5 mg thereafter. - Continue metformin 1000 mg twice daily.  - Continue lisinopril 20 mg daily - Patient needs to start statin therapy next visit.

## 2019-10-30 NOTE — Progress Notes (Signed)
   CC: Fall, dizziness, memory impairment  HPI:  [name] is a 53 y.o. female with PMHx listed below presenting for Fall, dizziness, memory impairment. Please see the A&P for the status of the patient's chronic medical problems.  Past Medical History:  Diagnosis Date  . Asthma    Required inhaled corticosteroid in past.   . Depression, major 02/25/2013   First episode that required was 2014. Prior episodes that did not require pt to seek medical tx.  . Hypertension    2 drug therapy  . Low back pain 08/10/2016  . Nausea 09/19/2018  . OSA (obstructive sleep apnea)    Sleep study 12/2006 : Moderately Severe OSA. AHI 38.8. O2 sat decreased to 71%.  . Severe obesity (BMI >= 40) (HCC) 10/08/2012   Obesity Class 3. BMI > 40.   . Strain of flexor muscle of right hip 09/19/2018  . Tobacco abuse    Review of Systems:  Performed and all others negative.  Physical Exam: Vitals:   10/30/19 0907  BP: (!) 174/110  Pulse: 83  SpO2: 97%  Weight: 237 lb 3.2 oz (107.6 kg)   General: Obese female in no acute distress Pulm: Good air movement with no wheezing or crackles  CV: RRR, no murmurs, no rubs  Assessment & Plan:   See Encounters Tab for problem based charting.  Patient discussed with Dr. Dareen Piano

## 2019-10-30 NOTE — Patient Instructions (Signed)
Thank you for allowing [place] to provide your care. Today I will stop taking the gabapentin.  For your diabetes we are switching you to a medication called somatically tied. You'll take this once weekly. Start with 0.25 mg weekly. Take this for four weeks and then increase it to 0.5 mg weekly.  I would like you to come back in four weeks or sooner if any issues arise.

## 2019-10-30 NOTE — Telephone Encounter (Signed)
[facility] called, clarified directions for ozempic

## 2019-10-30 NOTE — Assessment & Plan Note (Signed)
Patient with hypertension. She is currently on lisinopril 20 mg and hydrochlorothiazide 25 mg daily. Her blood pressure today is elevated at 174/110. She did not take her medications this morning. When she does take them she denies any adverse reactions. She denies orthostatic symptoms.  I reviewed her last BMP with stable renal function and potassium.  A/P: - Uncontrolled. Goal < 130/80  - Continue lisinopril 20 mg and hydrochlorothiazide 25 mg daily - Recheck BMP at next visit

## 2019-11-03 ENCOUNTER — Telehealth: Payer: Self-pay | Admitting: Internal Medicine

## 2019-11-03 NOTE — Telephone Encounter (Signed)
Pt requesting to speak to a nurse [phone]

## 2019-11-03 NOTE — Telephone Encounter (Signed)
Return pt's call - stated after having recent fall, she has been having trouble breathing which she forgot to tell Dr Tarri Abernethy last week. She's not sure if she's having panic attacks. Not sleeping well - thinking she's going to die (had a friend to die recently). Using CPAP. She has been dozing in the recliner. She has not tried any OTC sleep med; stated she's too scared.  Thanks

## 2019-11-04 ENCOUNTER — Ambulatory Visit (INDEPENDENT_AMBULATORY_CARE_PROVIDER_SITE_OTHER): Payer: PRIVATE HEALTH INSURANCE | Admitting: Internal Medicine

## 2019-11-04 ENCOUNTER — Other Ambulatory Visit: Payer: Self-pay

## 2019-11-04 ENCOUNTER — Encounter: Payer: Self-pay | Admitting: Internal Medicine

## 2019-11-04 VITALS — BP 122/85 | HR 91 | Temp 98.4°F | Wt 232.9 lb

## 2019-11-04 DIAGNOSIS — G4700 Insomnia, unspecified: Secondary | ICD-10-CM | POA: Diagnosis not present

## 2019-11-04 DIAGNOSIS — F4322 Adjustment disorder with anxiety: Secondary | ICD-10-CM | POA: Diagnosis not present

## 2019-11-04 DIAGNOSIS — M797 Fibromyalgia: Secondary | ICD-10-CM

## 2019-11-04 MED ORDER — DULOXETINE HCL 60 MG PO CPEP: 60.0000 mg | ORAL_CAPSULE | Freq: Every day | ORAL | 3 refills | Status: DC

## 2019-11-04 NOTE — Progress Notes (Signed)
   CC: Insomnia  HPI:  [name] is a 53 y.o. very pleasant African-American presenting for evaluation of insomnia.    Please see problem based charting for further details.  Past Medical History:  Diagnosis Date  . Asthma    Required inhaled corticosteroid in past.   . Depression, major 02/25/2013   First episode that required was 2014. Prior episodes that did not require pt to seek medical tx.  . Hypertension    2 drug therapy  . Low back pain 08/10/2016  . Nausea 09/19/2018  . OSA (obstructive sleep apnea)    Sleep study 12/2006 : Moderately Severe OSA. AHI 38.8. O2 sat decreased to 71%.  . Severe obesity (BMI >= 40) (HCC) 10/08/2012   Obesity Class 3. BMI > 40.   . Strain of flexor muscle of right hip 09/19/2018  . Tobacco abuse    Review of Systems:  As per HPI  Physical Exam:  Vitals:   11/04/19 1424  BP: 122/85  Pulse: 91  Temp: 98.4 F (36.9 C)  TempSrc: Oral  SpO2: 99%  Weight: 232 lb 14.4 oz (105.6 kg)   Physical Exam  Constitutional: She is well-developed, well-nourished, and in no distress. No distress.  Skin: She is not diaphoretic.  Psychiatric: Affect and judgment normal. Her mood appears anxious. Her affect is not blunt, not labile and not inappropriate. She is not agitated. She does not exhibit a depressed mood. She expresses no homicidal and no suicidal ideation. She expresses no suicidal plans and no homicidal plans. She is not apathetic. She does not have a flat affect.    Assessment & Plan:   See Encounters Tab for problem based charting.  Patient discussed with Dr. Philipp Ovens

## 2019-11-04 NOTE — Telephone Encounter (Signed)
Talked to pt - stated she's going to get tested for covid for her job. Offered pt an appt today to discuss her problems sleeping - she agreed; [facility] appt scheduled for today @ 1415PM.

## 2019-11-04 NOTE — Assessment & Plan Note (Signed)
Adjustment disorder with anxious mood: [name] reports that she has been feeling anxious ever since her friend suddenly passed away in her sleep on Thanksgiving day.  Since then, she has been experiencing insomnia where she would find only wake up at night with feelings of anxiety, nervous, scared and feeling hot all over.  She also states that the ongoing pandemic has exponentially increased her anxiety level.  In addition, there are several family issues including the relationship between her daughter and granddaughter seems to be causing her anxiety as well.  She states that sometimes she will trying "calm tea with honey" for relief.  She currently denies any SI or HI.  Her PHQ-9 score today is 16 and CAT score is 15  Assessment: Adjustment disorder with anxious mood  Plan: -Began counseling patient on coping mechanisms -Appointment with integrated behavioral health -Continue Cymbalta (for fibromyalgia)

## 2019-11-04 NOTE — Telephone Encounter (Signed)
Pt is calling back [phone]

## 2019-11-04 NOTE — Patient Instructions (Signed)
Crystal English,   It was a pleasure taking care of you here in the clinic today.  I am sorry to hear about the anxiety you have been experiencing.  What you have is most likely called an adjustment disorder and is related to your anxiety about your friend that recently passed away.  Most of the time, patient is doing well with counseling and I have set you up with our clinic health specialist.  Take care!  Adjustment Disorder, Adult Adjustment disorder is a group of symptoms that can develop after a stressful life event, such as the loss of a job or serious physical illness. The symptoms can affect how you feel, think, and act. They may interfere with your relationships. Adjustment disorder increases your risk of suicide and substance abuse. If this disorder is not managed early, it can develop into a more serious condition, such as major depressive disorder or post-traumatic stress disorder. What are the causes? This condition happens when you have trouble recovering from or coping with a stressful life event. What increases the risk? You are more likely to develop this condition if:  You have had depression or anxiety.  You are being treated for a long-term (chronic) illness.  You are being treated for an illness that cannot be cured (terminal illness).  You have a family history of mental illness. What are the signs or symptoms? Symptoms of this condition include:  Extreme trouble doing daily tasks, such as going to work.  Sadness, depression, or crying spells.  Worrying a lot.  Loss of enjoyment.  Change in appetite or weight.  Feelings of loss or hopelessness.  Thoughts of suicide.  Anxiety, worry, or nervousness.  Trouble sleeping.  Avoiding family and friends.  Fighting or vandalism.  Complaining of feeling sick without being ill.  Feeling dazed or disconnected.  Nightmares.  Trouble sleeping.  Irritability.  Reckless driving.  Poor work  Systems analyst.  Ignoring bills. Symptoms of this condition start within three months of the stressful event. They do not last more than six months, unless the stressful circumstances last longer. Normal grieving after the death of a loved one is not a symptom of this condition. How is this diagnosed? To diagnose this condition, your health care provider will ask about what has happened in your life and how it has affected you. He or she may also ask about your medical history and your use of medicines, alcohol, and other substances. Your health care provider may do a physical exam and order lab tests or other studies. You may be referred to a mental health specialist. How is this treated? Treatment options for this condition include:  Counseling or talk therapy. Talk therapy is usually provided by mental health specialists.  Medicines. Certain medicines may help with depression, anxiety, and sleep.  Support groups. These offer emotional support, advice, and guidance. They are made up of people who have had similar experiences.  Observation and time. This is sometimes called "watchful waiting." In this treatment, health care providers monitor your health and behavior without other treatment. Adjustment disorder sometimes gets better on its own with time. Follow these instructions at home:  Take over-the-counter and prescription medicines only as told by your health care provider.  Keep all follow-up visits as told by your health care provider. This is important. Contact a health care provider if:  Your symptoms do not improve in six months.  Your symptoms get worse. Get help right away if:  You have serious thoughts about  hurting yourself or someone else. If you ever feel like you may hurt yourself or others, or have thoughts about taking your own life, get help right away. You can go to your nearest emergency department or call:  Your local emergency services (911 in the [place]  A  suicide crisis helpline, such as the [place] at (517)304-6415. This is open 24 hours a day. Summary  Adjustment disorder is a group of symptoms that can develop after a stressful life event, such as the loss of a job or serious physical illness. The symptoms can affect how you feel, think, and act. They may interfere with your relationships.  Symptoms of this condition start within three months of the stressful event. They do not last more than six months, unless the stressful circumstances last longer.  Treatment may include talk therapy, medicines, participation in a support group, or observation to see if symptoms improve.  Contact your health care provider if your symptoms get worse or do not improve in six months.  If you ever feel like you may hurt yourself or others, or have thoughts about taking your own life, get help right away. This information is not intended to replace advice given to you by your health care provider. Make sure you discuss any questions you have with your health care provider. Document Released: 07/11/2006 Document Revised: 10/19/2017 Document Reviewed: 01/05/2017 Elsevier Patient Education  2020 [employer].

## 2019-11-04 NOTE — Telephone Encounter (Signed)
Called pt - stated she cannot sleep; not breathing during the night; wakes up in a colds sweat; ?panic attack ; burst of crying- stated all this happened after she felled last week. She does not know what's going on. Asking Dr Tarri Abernethy to call her.

## 2019-11-05 NOTE — Progress Notes (Signed)
Internal Medicine Clinic Attending  Case discussed with Dr. Agyei at the time of the visit.  We reviewed the resident's history and exam and pertinent patient test results.  I agree with the assessment, diagnosis, and plan of care documented in the resident's note.    

## 2019-11-06 ENCOUNTER — Encounter: Payer: Self-pay | Admitting: Licensed Clinical Social Worker

## 2019-11-06 ENCOUNTER — Telehealth: Payer: Self-pay | Admitting: Licensed Clinical Social Worker

## 2019-11-06 NOTE — Telephone Encounter (Signed)
Patient was called to discuss the referral. Patient was busy, and could not talk. Patient reported that she would call our office back to discuss services/ future appointment. A letter will also be mailed today.

## 2019-11-10 ENCOUNTER — Other Ambulatory Visit: Payer: Self-pay

## 2019-11-10 ENCOUNTER — Ambulatory Visit: Payer: PRIVATE HEALTH INSURANCE

## 2019-11-10 ENCOUNTER — Ambulatory Visit: Payer: PRIVATE HEALTH INSURANCE | Attending: Internal Medicine

## 2019-11-10 ENCOUNTER — Other Ambulatory Visit: Payer: PRIVATE HEALTH INSURANCE

## 2019-11-10 ENCOUNTER — Telehealth: Payer: Self-pay | Admitting: Internal Medicine

## 2019-11-10 ENCOUNTER — Ambulatory Visit (INDEPENDENT_AMBULATORY_CARE_PROVIDER_SITE_OTHER): Payer: PRIVATE HEALTH INSURANCE | Admitting: Internal Medicine

## 2019-11-10 DIAGNOSIS — S8990XA Unspecified injury of unspecified lower leg, initial encounter: Secondary | ICD-10-CM

## 2019-11-10 DIAGNOSIS — Y92002 Bathroom of unspecified non-institutional (private) residence single-family (private) house as the place of occurrence of the external cause: Secondary | ICD-10-CM | POA: Diagnosis not present

## 2019-11-10 DIAGNOSIS — W19XXXA Unspecified fall, initial encounter: Secondary | ICD-10-CM | POA: Insufficient documentation

## 2019-11-10 DIAGNOSIS — Z20822 Contact with and (suspected) exposure to covid-19: Secondary | ICD-10-CM

## 2019-11-10 NOTE — Progress Notes (Signed)
Internal Medicine Clinic Attending  Case discussed with Dr. Helberg at the time of the visit.  We reviewed the resident's history and exam and pertinent patient test results.  I agree with the assessment, diagnosis, and plan of care documented in the resident's note.    

## 2019-11-10 NOTE — Telephone Encounter (Signed)
Returned pt's call - no answer; left message to call the office.

## 2019-11-10 NOTE — Telephone Encounter (Signed)
Pt would like a nurse to call back, pt fell again [phone]

## 2019-11-10 NOTE — Telephone Encounter (Signed)
Spoke to pt, she has had 2 falls in 1 week, injuring her knee both times. She thinks it could be r/t medication. After face to face appt was made she told nurse about being exposed to [place] last week, she is to be tested this week, her niece is POSITIVE and her daughter is being tested today and has symptoms. Pt denies any symptoms.  Made TELEHEALTH appt for today

## 2019-11-10 NOTE — Progress Notes (Addendum)
   CC: Fall  This is a telephone encounter between [employer] and [employer] on 11/10/2019 for fall. The visit was conducted with the patient located at home and [facility] at [facility]. The patient's identity was confirmed using their DOB and current address. The patient has consented to being evaluated through a telephone encounter and understands the associated risks (an examination cannot be done and the patient may need to come in for an appointment) / benefits (allows the patient to remain at home, decreasing exposure to coronavirus). I personally spent 14 minutes on medical discussion.   HPI:  [name] is a 53 y.o. with PMH as below.   Please see A&P for assessment of the patient's acute and chronic medical conditions.   Past Medical History:  Diagnosis Date  . Asthma    Required inhaled corticosteroid in past.   . Depression, major 02/25/2013   First episode that required was 2014. Prior episodes that did not require pt to seek medical tx.  . Hypertension    2 drug therapy  . Low back pain 08/10/2016  . Nausea 09/19/2018  . OSA (obstructive sleep apnea)    Sleep study 12/2006 : Moderately Severe OSA. AHI 38.8. O2 sat decreased to 71%.  . Severe obesity (BMI >= 40) (HCC) 10/08/2012   Obesity Class 3. BMI > 40.   . Strain of flexor muscle of right hip 09/19/2018  . Tobacco abuse    Review of Systems:  Performed and all others negative.  Assessment & Plan:   See Encounters Tab for problem based charting.  Patient discussed with Dr. Evette Doffing

## 2019-11-10 NOTE — Assessment & Plan Note (Signed)
Patient called in for evaluation of fall on 12/20. She states that she ate some pizza and chips. Lay down to take a nap, and subsequently woke up with an urge to defecate. She went to the bathroom and on her way back to her bedroom started to feel flush, nauseous, and weak. She leaned against the wall and culture granddaughter. Before her granddaughter could come to get her she was on the floor. She does not think she completely passed out. She is not sure if she tripped on something. She did not hit her head. She states that she has had an episode like this in the past when she has had food poisoning. Since this episode yesterday she has had no recurrence of her symptoms. With the fall she did injure her knee however she is able to ambulate today.  She is in the middle menopause. She has been having night sweats and hot flashes throughout the day. However she recently restarted her Cymbalta and since that time the symptoms have improved. She otherwise has never passed out before.  A/P: - Discussed that this could be vasovagal syncope vs autonomic symptoms of menopause vs food poisoning - We will continue to monitor symptoms. If she has recurrent she will call us back.

## 2019-11-11 ENCOUNTER — Other Ambulatory Visit: Payer: PRIVATE HEALTH INSURANCE

## 2019-11-11 ENCOUNTER — Encounter: Payer: Self-pay | Admitting: Internal Medicine

## 2019-11-11 LAB — NOVEL CORONAVIRUS, NAA: SARS-CoV-2, NAA: NOT DETECTED

## 2019-11-12 ENCOUNTER — Telehealth: Payer: Self-pay

## 2019-11-12 NOTE — Telephone Encounter (Signed)
Spoke with patient and went over the recommendation and guidelines for testing. Patient verbalized understanding

## 2019-11-17 ENCOUNTER — Ambulatory Visit: Payer: PRIVATE HEALTH INSURANCE | Attending: Internal Medicine

## 2019-11-17 ENCOUNTER — Encounter: Payer: Self-pay | Admitting: Internal Medicine

## 2019-11-17 DIAGNOSIS — Z20822 Contact with and (suspected) exposure to covid-19: Secondary | ICD-10-CM

## 2019-11-19 ENCOUNTER — Encounter: Payer: Self-pay | Admitting: Internal Medicine

## 2019-11-19 ENCOUNTER — Telehealth: Payer: Self-pay | Admitting: Internal Medicine

## 2019-11-19 LAB — NOVEL CORONAVIRUS, NAA: SARS-CoV-2, NAA: DETECTED — AB

## 2019-11-19 NOTE — Telephone Encounter (Signed)
Pt is requesting for a nurse to callback [phone]

## 2019-11-19 NOTE — Telephone Encounter (Signed)
rtc to pt, she states her COVID test was negative and she wants to find out whats wrong with her, she states when she goes across the street to the store when she gets back she is so tired. She states her test 12/28 was negative, nurse informed her it is positive, she is given instructions on hands, surface cleaning, mask wearing, 59ft distance in home, quarantine for 10 days, drinking plenty of fluids, resting, keeping linens and dishes very clean.

## 2019-11-20 ENCOUNTER — Telehealth: Payer: Self-pay | Admitting: Unknown Physician Specialty

## 2019-11-20 NOTE — Telephone Encounter (Signed)
Called to discuss with patient about Covid symptoms and the use of bamlanivimab, a monoclonal antibody infusion for those with mild to moderate Covid symptoms and at a high risk of hospitalization.  Pt is qualified for this infusion at the [facility] due to Diabetes   Message left to call back  

## 2019-11-27 ENCOUNTER — Ambulatory Visit (INDEPENDENT_AMBULATORY_CARE_PROVIDER_SITE_OTHER): Payer: PRIVATE HEALTH INSURANCE | Admitting: Internal Medicine

## 2019-11-27 ENCOUNTER — Other Ambulatory Visit: Payer: Self-pay

## 2019-11-27 DIAGNOSIS — U071 COVID-19: Secondary | ICD-10-CM

## 2019-11-27 NOTE — Progress Notes (Signed)
   CC: COVID-19  This is a telephone encounter between Crystal English and [employer] on 11/27/2019 for covid-19. The visit was conducted with the patient located at home and [facility] at [facility]. The patient's identity was confirmed using their DOB and current address. The patient has consented to being evaluated through a telephone encounter and understands the associated risks (an examination cannot be done and the patient may need to come in for an appointment) / benefits (allows the patient to remain at home, decreasing exposure to coronavirus). I personally spent 8 minutes on medical discussion.   HPI:  Crystal English is a 54 y.o. with PMH as below.   Please see A&P for assessment of the patient's acute and chronic medical conditions.   Past Medical History:  Diagnosis Date  . Asthma    Required inhaled corticosteroid in past.   . Depression, major 02/25/2013   First episode that required was 2014. Prior episodes that did not require pt to seek medical tx.  . Hypertension    2 drug therapy  . Low back pain 08/10/2016  . Nausea 09/19/2018  . OSA (obstructive sleep apnea)    Sleep study 12/2006 : Moderately Severe OSA. AHI 38.8. O2 sat decreased to 71%.  . Severe obesity (BMI >= 40) (HCC) 10/08/2012   Obesity Class 3. BMI > 40.   . Strain of flexor muscle of right hip 09/19/2018  . Tobacco abuse    Review of Systems:  Performed and all others negative.  Assessment & Plan:   See Encounters Tab for problem based charting.  Patient discussed with Dr. Philipp Ovens

## 2019-11-28 DIAGNOSIS — U071 COVID-19: Secondary | ICD-10-CM | POA: Insufficient documentation

## 2019-11-28 NOTE — Assessment & Plan Note (Signed)
Patient diagnosed with COVID on 12/28. She has been isolating at home and is doing well. She continues to have headaches, fatigue, and rhinorrhea. She is not having any fever, cough, SHOB, diarrhea. She has been using OTC tylenol and mucinx to help with her symptoms.   She overall appears stable and we discussed continued isolation for 14 days (on day 11). All questions and concerns addressed.

## 2019-12-01 NOTE — Progress Notes (Signed)
Internal Medicine Clinic Attending  Case discussed with Dr. Helberg at the time of the visit.  We reviewed the resident's history and exam and pertinent patient test results.  I agree with the assessment, diagnosis, and plan of care documented in the resident's note.    

## 2019-12-04 NOTE — Addendum Note (Signed)
Addended by: Hulan Fray on: 12/04/2019 03:24 PM   Modules accepted: Orders

## 2020-01-05 ENCOUNTER — Encounter: Payer: Self-pay | Admitting: Internal Medicine

## 2020-01-15 ENCOUNTER — Other Ambulatory Visit: Payer: Self-pay

## 2020-01-15 ENCOUNTER — Ambulatory Visit (HOSPITAL_COMMUNITY)
Admission: RE | Admit: 2020-01-15 | Discharge: 2020-01-15 | Disposition: A | Discharge status: Home / Self Care | Payer: PRIVATE HEALTH INSURANCE | Source: Ambulatory Visit | Attending: Student in an Organized Health Care Education/Training Program | Admitting: Student in an Organized Health Care Education/Training Program

## 2020-01-15 ENCOUNTER — Ambulatory Visit: Payer: PRIVATE HEALTH INSURANCE | Admitting: Internal Medicine

## 2020-01-15 ENCOUNTER — Encounter: Payer: Self-pay | Admitting: Internal Medicine

## 2020-01-15 VITALS — BP 141/91 | HR 105 | Temp 98.1°F | Ht 61.0 in | Wt 229.8 lb

## 2020-01-15 DIAGNOSIS — Z79899 Other long term (current) drug therapy: Secondary | ICD-10-CM

## 2020-01-15 DIAGNOSIS — M797 Fibromyalgia: Secondary | ICD-10-CM | POA: Diagnosis not present

## 2020-01-15 DIAGNOSIS — E119 Type 2 diabetes mellitus without complications: Secondary | ICD-10-CM | POA: Diagnosis not present

## 2020-01-15 DIAGNOSIS — M25562 Pain in left knee: Secondary | ICD-10-CM | POA: Insufficient documentation

## 2020-01-15 DIAGNOSIS — K148 Other diseases of tongue: Secondary | ICD-10-CM | POA: Diagnosis not present

## 2020-01-15 DIAGNOSIS — Z6841 Body Mass Index (BMI) 40.0 and over, adult: Secondary | ICD-10-CM

## 2020-01-15 DIAGNOSIS — Z7984 Long term (current) use of oral hypoglycemic drugs: Secondary | ICD-10-CM

## 2020-01-15 DIAGNOSIS — Q382 Macroglossia: Secondary | ICD-10-CM | POA: Insufficient documentation

## 2020-01-15 HISTORY — DX: Pain in left knee: M25.562

## 2020-01-15 NOTE — Assessment & Plan Note (Signed)
Uncontrolled fibromyalgia. Describes intermittent pain in her shoulders arms and hands. Some days are worse than others. Some days the pain is so bad she can't do her ADLs. Her mother has recommended she get help cleaning her house. She is not physically active. She is tried gabapentin in the past but had ataxia and so was discontinued. She is currently on duloxetine and tolerating it well.  A/P: - Uncontrolled  - Recommended exercise and weight loss - Discussed different medications including Lyrica and Flexeril. She would like to try exercise and weight loss first. - Continue duloxetine 60 mg once daily.

## 2020-01-15 NOTE — Assessment & Plan Note (Signed)
Patient with residual left knee pain after a fall 3 to 4 months ago. She has a palpable nodule on top of the patella. There is extreme tenderness to palpation. Will get knee x-rays with sunrise view to evaluate for fracture.

## 2020-01-15 NOTE — Assessment & Plan Note (Signed)
Patient with morbid obesity and related health complications. She is on Semaglutide for help with weight loss. She continues to drink a lot of high calorie drinks including ginger ale. She has not started exercising. She is interested in referral to a weight management clinic. I have provided her with a number today.

## 2020-01-15 NOTE — Assessment & Plan Note (Signed)
Patient with uncontrolled diabetes mellitus. Not different A1c check. She is currently on metformin 1000 mg BID and Semaglutide 0.25 mg Qweekly. She is having some nausea with the semaglutide. Her weight is down 8 lbs.   A/P: - Continue metformin 1000 mg BID  - Discussed switching from semaglutide but patient declined she would like to continue the medication  - Continue Lisinopril  - Needs statin  - Declined PNA vaccine and foot exam

## 2020-01-15 NOTE — Assessment & Plan Note (Signed)
Patient voices concerns about chronic tongue pain. On physical exam she has visible indentations in her tongue from her teeth. Consistent with macroglossia. Will check IFE and kappa/lambda light chains to evaluate for amyloidosis.

## 2020-01-15 NOTE — Progress Notes (Signed)
   CC: DM, Obesity, fibromyalgia, knee pain  HPI:  Crystal English is a 54 y.o. female with PMHx listed below presenting for DM, Obesity, fibromyalgia, knee pain. Please see the A&P for the status of the patient's chronic medical problems.  Past Medical History:  Diagnosis Date  . Asthma    Required inhaled corticosteroid in past.   . Depression, major 02/25/2013   First episode that required was 2014. Prior episodes that did not require pt to seek medical tx.  . Hypertension    2 drug therapy  . Low back pain 08/10/2016  . Nausea 09/19/2018  . OSA (obstructive sleep apnea)    Sleep study 12/2006 : Moderately Severe OSA. AHI 38.8. O2 sat decreased to 71%.  . Severe obesity (BMI >= 40) (HCC) 10/08/2012   Obesity Class 3. BMI > 40.   . Strain of flexor muscle of right hip 09/19/2018  . Tobacco abuse    Review of Systems:  Performed and all others negative.  Physical Exam: Vitals:   01/15/20 1356  BP: (!) 141/91  Pulse: (!) 105  Temp: 98.1 F (36.7 C)  SpO2: 97%  Weight: 229 lb 12.8 oz (104.2 kg)  Height: 5\' 1"  (1.549 m)   General: Obese female in no acute distress Pulm: Good air movement with no wheezing or crackles  CV: RRR, no murmurs, no rubs   Assessment & Plan:   See Encounters Tab for problem based charting.  Patient discussed with Dr. Philipp Ovens

## 2020-01-15 NOTE — Progress Notes (Signed)
Internal Medicine Clinic Attending  Case discussed with Dr. Helberg at the time of the visit.  We reviewed the resident's history and exam and pertinent patient test results.  I agree with the assessment, diagnosis, and plan of care documented in the resident's note.    

## 2020-01-15 NOTE — Patient Instructions (Addendum)
Thank you for allowing [place] to provide your care. Today we are getting x-rays of your knee. I will call you if there is anything noted. We are also checking some blood work and I will call you with the results.   Call [facility] weight management clinic at [phone] to schedule an appointment for further evaluation. Weight loss will be the most beneficial thing for your health.   Continue the Semaglutide but if your nausea does not improve please call me and we can discuss changing medications.  Please come back to see me in 3 months or sooner if any issue arise.

## 2020-01-20 ENCOUNTER — Telehealth: Payer: Self-pay

## 2020-01-20 LAB — KAPPA/LAMBDA LIGHT CHAINS
Ig Kappa Free Light Chain: 33.1 mg/L — ABNORMAL HIGH (ref 3.3–19.4)
Ig Lambda Free Light Chain: 29.9 mg/L — ABNORMAL HIGH (ref 5.7–26.3)
KAPPA/LAMBDA RATIO: 1.11 (ref 0.26–1.65)

## 2020-01-20 LAB — IMMUNOFIXATION ELECTROPHORESIS
IgA/Immunoglobulin A, Serum: 315 mg/dL (ref 87–352)
IgG (Immunoglobin G), Serum: 1378 mg/dL (ref 586–1602)
IgM (Immunoglobulin M), Srm: 59 mg/dL (ref 26–217)
Total Protein: 7.2 g/dL (ref 6.0–8.5)

## 2020-01-20 NOTE — Telephone Encounter (Signed)
Requesting lab results, please call pt back.  

## 2020-01-22 ENCOUNTER — Encounter: Payer: Self-pay | Admitting: Internal Medicine

## 2020-01-22 ENCOUNTER — Other Ambulatory Visit: Payer: Self-pay

## 2020-01-22 ENCOUNTER — Telehealth: Payer: Self-pay

## 2020-01-22 ENCOUNTER — Ambulatory Visit (INDEPENDENT_AMBULATORY_CARE_PROVIDER_SITE_OTHER): Payer: PRIVATE HEALTH INSURANCE | Admitting: Internal Medicine

## 2020-01-22 DIAGNOSIS — M25562 Pain in left knee: Secondary | ICD-10-CM

## 2020-01-22 NOTE — Assessment & Plan Note (Addendum)
Patient is following up on knee pain and results from recent visit with PCP on 2/25. She was previously informed that the x-ray was negative for fracture or other boney abnormality. She states she does not feel she can wait for follow up for next steps. It is very painful to touch, even if brushed. We discussed referral to sports medicine for evaluation and possible ultrasound to look closer for clues about her injury as well as other imaging or next steps if needed. She would like to see sports medicine for this. - Continue PRN NSAIDs - Referral to sports medicine

## 2020-01-22 NOTE — Telephone Encounter (Signed)
I apologize. I am on nights and have not had the opportunity to call her. Please tell her that all her lab results came back normal. There is nothing further to do right now.   Thanks,  Dr. Tarri Abernethy

## 2020-01-22 NOTE — Telephone Encounter (Signed)
Questions about CPAP, pleases call pt back.

## 2020-01-22 NOTE — Progress Notes (Signed)
  This is a telephone encounter between Crystal English and Neva Seat on 01/22/2020. The visit was conducted with the patient located at home and [employer]a Seat at [facility]. The patient's identity was confirmed using their DOB and current address. The patient has consented to being evaluated through a telephone encounter and understands the associated risks (an examination cannot be done and the patient may need to come in for an appointment) / benefits (allows the patient to remain at home, decreasing exposure to coronavirus). I personally spent 8 minutes on medical discussion.   CC: Knee pain  HPI:  [name] is a 54 y.o. F with PMHx listed below presenting for Knee pain. Please see the A&P for the status of the patient's chronic medical problems.  Past Medical History:  Diagnosis Date  . Asthma    Required inhaled corticosteroid in past.   . Depression, major 02/25/2013   First episode that required was 2014. Prior episodes that did not require pt to seek medical tx.  . Hypertension    2 drug therapy  . Low back pain 08/10/2016  . Nausea 09/19/2018  . OSA (obstructive sleep apnea)    Sleep study 12/2006 : Moderately Severe OSA. AHI 38.8. O2 sat decreased to 71%.  . Severe obesity (BMI >= 40) (HCC) 10/08/2012   Obesity Class 3. BMI > 40.   . Strain of flexor muscle of right hip 09/19/2018  . Tobacco abuse    Review of Systems:  Performed and all others negative.  Physical Exam:  There were no vitals filed for this visit. Not performed for telephone visit  Assessment & Plan:   See Encounters Tab for problem based charting.  Patient discussed with Dr. Dareen Piano

## 2020-01-22 NOTE — Progress Notes (Signed)
Internal Medicine Clinic Attending  Case discussed with Dr. Melvin  at the time of the visit.  We reviewed the resident's history and exam and pertinent patient test results.  I agree with the assessment, diagnosis, and plan of care documented in the resident's note.  

## 2020-01-23 ENCOUNTER — Ambulatory Visit: Payer: PRIVATE HEALTH INSURANCE | Admitting: Sports Medicine

## 2020-01-23 ENCOUNTER — Encounter: Payer: Self-pay | Admitting: Internal Medicine

## 2020-01-23 ENCOUNTER — Ambulatory Visit: Payer: PRIVATE HEALTH INSURANCE | Admitting: Internal Medicine

## 2020-01-23 DIAGNOSIS — G4733 Obstructive sleep apnea (adult) (pediatric): Secondary | ICD-10-CM

## 2020-01-23 NOTE — Telephone Encounter (Signed)
Thank you :)

## 2020-01-23 NOTE — Progress Notes (Signed)
Internal Medicine Clinic Attending  Case discussed with Dr. Melvin  at the time of the visit.  We reviewed the resident's history and exam and pertinent patient test results.  I agree with the assessment, diagnosis, and plan of care documented in the resident's note.  

## 2020-01-23 NOTE — Progress Notes (Signed)
  This is a telephone encounter between Orlene Och and Neva Seat on 01/23/2020. The visit was conducted with the patient located at home and Neva Seat at [facility]. The patient's identity was confirmed using their DOB and current address. The patient has consented to being evaluated through a telephone encounter and understands the associated risks (an examination cannot be done and the patient may need to come in for an appointment) / benefits (allows the patient to remain at home, decreasing exposure to coronavirus). I personally spent 3 minutes on medical discussion.  CC: OSA  HPI:  Ms.Zauria L Hiles is a 54 y.o. F with PMHx listed below presenting for OSA. Please see the A&P for the status of the patient's chronic medical problems.   Past Medical History:  Diagnosis Date  . Asthma    Required inhaled corticosteroid in past.   . Depression, major 02/25/2013   First episode that required was 2014. Prior episodes that did not require pt to seek medical tx.  . Hypertension    2 drug therapy  . Low back pain 08/10/2016  . Nausea 09/19/2018  . OSA (obstructive sleep apnea)    Sleep study 12/2006 : Moderately Severe OSA. AHI 38.8. O2 sat decreased to 71%.  . Severe obesity (BMI >= 40) (HCC) 10/08/2012   Obesity Class 3. BMI > 40.   . Strain of flexor muscle of right hip 09/19/2018  . Tobacco abuse    Review of Systems:  Performed and all others negative.  Physical Exam:  There were no vitals filed for this visit. Not performed for telephone visit  Assessment & Plan:   See Encounters Tab for problem based charting.  Patient discussed with Dr. Lynnae January

## 2020-01-23 NOTE — Assessment & Plan Note (Addendum)
HPI: Patient has started CPAP for her OSA. She states she has eben using the CPAP 6 nights per week and has been able to leave it on for the whole night (about 7-8 hours). Her syptoms have improved greatly, sleep has no interruptions and no longer has sensation of drowning. She reports no issues with air leak.  A/P: - Continue with CPAP

## 2020-01-23 NOTE — Telephone Encounter (Signed)
I spoke with patient today about her CPAP and discussed the labs with her at that time.

## 2020-01-23 NOTE — Telephone Encounter (Signed)
Appointment given for a  Tele-heath visit today concerning the use of her C-pap machine.The visit was from Dr. Trilby Drummer.I will let [employer] no the visit is in [place], [place] C3/5/202112:06 PM

## 2020-01-26 NOTE — Telephone Encounter (Signed)
Fredderick Erb, [place], [place]; [place], Jarrett Ables, Orvis Brill, RN  [place], I will get this pulled.       Previous Messages   ----- Message -----  From: Judge Stall, [place]  Sent: 01/23/2020 11:46 AM EST  To: Elon Alas, [place], [place], *  Subject: C-pap follow up                  Updated note in for today [place] C3/5/202111:47 AM

## 2020-02-07 ENCOUNTER — Other Ambulatory Visit: Payer: Self-pay | Admitting: Internal Medicine

## 2020-02-07 DIAGNOSIS — I10 Essential (primary) hypertension: Secondary | ICD-10-CM

## 2020-02-10 ENCOUNTER — Telehealth: Payer: Self-pay | Admitting: *Deleted

## 2020-02-10 NOTE — Telephone Encounter (Addendum)
Information was sent through CoverMyMeds for PA for Metformin ER 1,000 mg. Awaiting determination.  Sander Nephew, RN 02/10/2020 3:08PM. Metformin HCL PA was approved 02/10/2020 thru 02/09/2022. Sander Nephew, RN 02/25/2020 9:28 AM.

## 2020-02-25 ENCOUNTER — Telehealth: Payer: Self-pay | Admitting: Internal Medicine

## 2020-02-25 NOTE — Telephone Encounter (Signed)
Pt calls and states she needs additional paperwork but does not know what she needs. Advised to call her human resources dept and find out from them what exactly she does need and then call [facility] back. She is agreeable

## 2020-02-25 NOTE — Telephone Encounter (Signed)
Patient requesting a call back in reference to her FMLA form that was completed.

## 2020-03-04 ENCOUNTER — Encounter: Payer: Self-pay | Admitting: Internal Medicine

## 2020-03-11 ENCOUNTER — Other Ambulatory Visit: Payer: Self-pay | Admitting: Internal Medicine

## 2020-03-11 DIAGNOSIS — M7062 Trochanteric bursitis, left hip: Secondary | ICD-10-CM

## 2020-03-15 ENCOUNTER — Encounter: Payer: Self-pay | Admitting: Internal Medicine

## 2020-03-15 MED ORDER — IBUPROFEN 800 MG PO TABS: 800.0000 mg | ORAL_TABLET | Freq: Three times a day (TID) | ORAL | 3 refills | Status: DC | PRN

## 2020-03-25 ENCOUNTER — Encounter: Payer: Self-pay | Admitting: *Deleted

## 2020-03-25 ENCOUNTER — Ambulatory Visit (INDEPENDENT_AMBULATORY_CARE_PROVIDER_SITE_OTHER): Payer: Self-pay | Admitting: Family Medicine

## 2020-03-29 ENCOUNTER — Telehealth: Payer: Self-pay | Admitting: Internal Medicine

## 2020-03-29 ENCOUNTER — Encounter: Payer: Self-pay | Admitting: Internal Medicine

## 2020-03-29 ENCOUNTER — Ambulatory Visit: Payer: PRIVATE HEALTH INSURANCE

## 2020-03-29 NOTE — Telephone Encounter (Signed)
Pt is requesting a callback [phone]

## 2020-03-29 NOTE — Telephone Encounter (Signed)
rtc to pt, mailbox full, no answer, will try again later

## 2020-04-05 ENCOUNTER — Ambulatory Visit (INDEPENDENT_AMBULATORY_CARE_PROVIDER_SITE_OTHER): Payer: PRIVATE HEALTH INSURANCE | Admitting: Internal Medicine

## 2020-04-05 VITALS — BP 124/76 | HR 97 | Temp 98.1°F | Ht 61.0 in | Wt 227.2 lb

## 2020-04-05 DIAGNOSIS — I10 Essential (primary) hypertension: Secondary | ICD-10-CM | POA: Diagnosis not present

## 2020-04-05 DIAGNOSIS — M797 Fibromyalgia: Secondary | ICD-10-CM

## 2020-04-05 DIAGNOSIS — E119 Type 2 diabetes mellitus without complications: Secondary | ICD-10-CM

## 2020-04-05 DIAGNOSIS — Z1159 Encounter for screening for other viral diseases: Secondary | ICD-10-CM | POA: Diagnosis not present

## 2020-04-05 MED ORDER — CYCLOBENZAPRINE HCL 5 MG PO TABS: 5.0000 mg | ORAL_TABLET | Freq: Every day | ORAL | 0 refills | Status: DC

## 2020-04-05 NOTE — Patient Instructions (Addendum)
Thank you for allowing [place] to provide your care today. Today we discussed your fibromyalgia    I have ordered no labs for you. I will call if any are abnormal.    Today we made the following changes to your medications.    Please start flexeril 5-10mg  daily  Please follow-up in as needed for your pain.    Should you have any questions or concerns please call the internal medicine clinic at [phone].    Cyclobenzaprine tablets What is this medicine? CYCLOBENZAPRINE (sye kloe BEN za preen) is a muscle relaxer. It is used to treat muscle pain, spasms, and stiffness. This medicine may be used for other purposes; ask your health care provider or pharmacist if you have questions. COMMON BRAND NAME(S): Fexmid, Flexeril What should I tell my health care provider before I take this medicine? They need to know if you have any of these conditions:  heart disease, irregular heartbeat, or previous heart attack  liver disease  thyroid problem  an unusual or allergic reaction to cyclobenzaprine, tricyclic antidepressants, lactose, other medicines, foods, dyes, or preservatives  pregnant or trying to get pregnant  breast-feeding How should I use this medicine? Take this medicine by mouth with a glass of water. Follow the directions on the prescription label. If this medicine upsets your stomach, take it with food or milk. Take your medicine at regular intervals. Do not take it more often than directed. Talk to your pediatrician regarding the use of this medicine in children. Special care may be needed. Overdosage: If you think you have taken too much of this medicine contact a poison control center or emergency room at once. NOTE: This medicine is only for you. Do not share this medicine with others. What if I miss a dose? If you miss a dose, take it as soon as you can. If it is almost time for your next dose, take only that dose. Do not take double or extra doses. What may interact with  this medicine? Do not take this medicine with any of the following medications:  MAOIs like Carbex, Eldepryl, Marplan, Nardil, and Parnate  narcotic medicines for cough  safinamide This medicine may also interact with the following medications:  alcohol  bupropion  antihistamines for allergy, cough and cold  certain medicines for anxiety or sleep  certain medicines for bladder problems like oxybutynin, tolterodine  certain medicines for depression like amitriptyline, fluoxetine, sertraline  certain medicines for Parkinson's disease like benztropine, trihexyphenidyl  certain medicines for seizures like phenobarbital, primidone  certain medicines for stomach problems like dicyclomine, hyoscyamine  certain medicines for travel sickness like scopolamine  general anesthetics like halothane, isoflurane, methoxyflurane, propofol  ipratropium  local anesthetics like lidocaine, pramoxine, tetracaine  medicines that relax muscles for surgery  narcotic medicines for pain  phenothiazines like chlorpromazine, mesoridazine, prochlorperazine, thioridazine  verapamil This list may not describe all possible interactions. Give your health care provider a list of all the medicines, herbs, non-prescription drugs, or dietary supplements you use. Also tell them if you smoke, drink alcohol, or use illegal drugs. Some items may interact with your medicine. What should I watch for while using this medicine? Tell your doctor or health care professional if your symptoms do not start to get better or if they get worse. You may get drowsy or dizzy. Do not drive, use machinery, or do anything that needs mental alertness until you know how this medicine affects you. Do not stand or sit up quickly, especially if you  are an older patient. This reduces the risk of dizzy or fainting spells. Alcohol may interfere with the effect of this medicine. Avoid alcoholic drinks. If you are taking another medicine  that also causes drowsiness, you may have more side effects. Give your health care provider a list of all medicines you use. Your doctor will tell you how much medicine to take. Do not take more medicine than directed. Call emergency for help if you have problems breathing or unusual sleepiness. Your mouth may get dry. Chewing sugarless gum or sucking hard candy, and drinking plenty of water may help. Contact your doctor if the problem does not go away or is severe. What side effects may I notice from receiving this medicine? Side effects that you should report to your doctor or health care professional as soon as possible:  allergic reactions like skin rash, itching or hives, swelling of the face, lips, or tongue  breathing problems  chest pain  fast, irregular heartbeat  hallucinations  seizures  unusually weak or tired Side effects that usually do not require medical attention (report to your doctor or health care professional if they continue or are bothersome):  headache  nausea, vomiting This list may not describe all possible side effects. Call your doctor for medical advice about side effects. You may report side effects to FDA at 1-800-FDA-1088. Where should I keep my medicine? Keep out of the reach of children. Store at room temperature between 15 and 30 degrees C (59 and 86 degrees F). Keep container tightly closed. Throw away any unused medicine after the expiration date. NOTE: This sheet is a summary. It may not cover all possible information. If you have questions about this medicine, talk to your doctor, pharmacist, or health care provider.  2020 Elsevier/Gold Standard (2018-10-09 12:49:26)

## 2020-04-05 NOTE — Progress Notes (Signed)
CC: Shoulder pain  HPI: [name] is a 54 y.o. with PMH listed below presenting with complaint of shoulder pain. Please see problem based assessment and plan for further details.  Past Medical History:  Diagnosis Date  . Asthma    Required inhaled corticosteroid in past.   . Depression, major 02/25/2013   First episode that required was 2014. Prior episodes that did not require pt to seek medical tx.  . Hypertension    2 drug therapy  . Low back pain 08/10/2016  . Nausea 09/19/2018  . OSA (obstructive sleep apnea)    Sleep study 12/2006 : Moderately Severe OSA. AHI 38.8. O2 sat decreased to 71%.  . Severe obesity (BMI >= 40) (HCC) 10/08/2012   Obesity Class 3. BMI > 40.   . Strain of flexor muscle of right hip 09/19/2018  . Tobacco abuse    Review of Systems: Review of Systems  Constitutional: Negative for chills and fever.  Eyes: Negative for blurred vision.  Respiratory: Negative for shortness of breath.   Cardiovascular: Negative for chest pain, palpitations and leg swelling.  Gastrointestinal: Negative for constipation, diarrhea, nausea and vomiting.  Genitourinary: Negative for dysuria.  Musculoskeletal: Positive for back pain, joint pain, myalgias and neck pain. Negative for falls.  Neurological: Negative for dizziness and headaches.  All other systems reviewed and are negative.   Physical Exam: Vitals:   04/05/20 1543 04/05/20 1625  BP: (!) 152/105 124/76  Pulse: 97   Temp: 98.1 F (36.7 C)   TempSrc: Oral   SpO2: 98%   Weight: 227 lb 3.2 oz (103.1 kg)   Height: 5\' 1"  (1.549 m)     Physical Exam  Constitutional: She is oriented to person, place, and time. She appears well-developed and well-nourished. No distress.  HENT:  Mouth/Throat: Oropharynx is clear and moist.  Eyes: Conjunctivae are normal.  Cardiovascular: Normal rate, regular rhythm, normal heart sounds and intact distal pulses.  No murmur heard. Respiratory: Effort normal and breath  sounds normal. She has no wheezes. She has no rales.  GI: Soft. Bowel sounds are normal. She exhibits no distension. There is no abdominal tenderness.  Musculoskeletal:        General: Tenderness (Significant tenderness to deep palpation of trapezius bilaterally with flexed musculature) present. No deformity or edema. Normal range of motion.     Comments: Fully intact bilateral upper extremity range of motion  Neurological: She is alert and oriented to person, place, and time.  Skin: Skin is warm and dry.    Assessment & Plan:   Need for hepatitis C screening test Agreeable to be screened for hepatitis C today  Essential hypertension BP Readings from Last 3 Encounters:  04/05/20 124/76  01/15/20 (!) 141/91  11/04/19 122/85   Blood pressure elevated at 152/105 but after given time to rest, 124/76 on re-check. Currently on lisinopril 20mg , HCTZ 25mg . Per chart review, over-due for bmp for electrolyte check while on the above anti-hypertensives. Denies any significant side effects.  - Bmp - C/w lisinopril 20mg , HCTZ 25mg   Diabetes mellitus ([place]) Presents w/ uncontrolled DM. 3 months since last check. Hemoglobin a1c 7.4 on 12/2019. Mentions adherence to regimen of metformin 1g BID, Semaglutide 0.25mg  weekly. Denies any significant side effects. Did not bring her glucometer to this visit.  - Will check hgb a1c - Currently on lowest possible dose of semaglutide. Likely need up-titration to 0.5mg  weekly for glycemic benefit if she is able to tolerate GI side effects.  Fibromyalgia  [name] is a 54 yo F w/ PMH of fibromyalgia, T2Dm, HTN, HLD who presents to [facility] for management of her fibromyalgia. She mentions that she is having significant worsening pain over the last week without obvious inciting events. She mentions having pain in her shoulders and arms. She states in the past duloxetine and ibuprofen has been enough to control her symptoms but with this current exacerbation, pain is  great enough to interfere with ADLs and IADLs, especially when reaching up to grab objects off the counter.  A/P [name] presents for worsening uncontrolled fibromyalgia. Per chart review, previously unable to tolerate gabapentin and was offered Flexeril in the past that she declined. On physical exam, diffuse myalgias with tenderness to palpation. ROM intact. Agreeable to trying muscle relaxants with these worsening symptoms.  - Start cyclobenzaprien 5mg  qhs - C/w duloxetine 60mg  daily (discussed increasing dosage of duloxetine but prefer not to make too many med changes at this time) - Encouraged exercise and weight loss    Patient discussed with Dr. Evette Doffing   -Gilberto Better, PGY2 [facility] Pager: [phone]

## 2020-04-05 NOTE — Progress Notes (Deleted)
   CC: ***  HPI: Crystal English is a 54 y.o. with PMH listed below presenting with complaint of ***. Please see problem based assessment and plan for further details.  Past Medical History:  Diagnosis Date  . Asthma    Required inhaled corticosteroid in past.   . Depression, major 02/25/2013   First episode that required was 2014. Prior episodes that did not require pt to seek medical tx.  . Hypertension    2 drug therapy  . Low back pain 08/10/2016  . Nausea 09/19/2018  . OSA (obstructive sleep apnea)    Sleep study 12/2006 : Moderately Severe OSA. AHI 38.8. O2 sat decreased to 71%.  . Severe obesity (BMI >= 40) (HCC) 10/08/2012   Obesity Class 3. BMI > 40.   . Strain of flexor muscle of right hip 09/19/2018  . Tobacco abuse     Review of Systems: ROS   Physical Exam: Vitals:   04/05/20 1543  BP: (!) 152/105  Pulse: 97  Temp: 98.1 F (36.7 C)  TempSrc: Oral  SpO2: 98%  Weight: 227 lb 3.2 oz (103.1 kg)  Height: 5\' 1"  (1.549 m)    Physical Exam  ***  Assessment & Plan:   No problem-specific Assessment & Plan notes found for this encounter.    Patient discussed with Dr. Evette Doffing   -Gilberto Better, PGY2 [facility] Pager: [phone]

## 2020-04-06 ENCOUNTER — Telehealth: Payer: Self-pay | Admitting: Internal Medicine

## 2020-04-06 ENCOUNTER — Encounter: Payer: Self-pay | Admitting: Internal Medicine

## 2020-04-06 DIAGNOSIS — E119 Type 2 diabetes mellitus without complications: Secondary | ICD-10-CM

## 2020-04-06 DIAGNOSIS — Z1159 Encounter for screening for other viral diseases: Secondary | ICD-10-CM | POA: Insufficient documentation

## 2020-04-06 LAB — HEMOGLOBIN A1C
Est. average glucose Bld gHb Est-mCnc: 169 mg/dL
Hgb A1c MFr Bld: 7.5 % — ABNORMAL HIGH (ref 4.8–5.6)

## 2020-04-06 LAB — BMP8+ANION GAP
Anion Gap: 14 mmol/L (ref 10.0–18.0)
BUN/Creatinine Ratio: 13 (ref 9–23)
BUN: 11 mg/dL (ref 6–24)
CO2: 27 mmol/L (ref 20–29)
Calcium: 9.8 mg/dL (ref 8.7–10.2)
Chloride: 99 mmol/L (ref 96–106)
Creatinine, Ser: 0.82 mg/dL (ref 0.57–1.00)
GFR calc Af Amer: 93 mL/min/{1.73_m2} (ref >59)
GFR calc non Af Amer: 81 mL/min/{1.73_m2} (ref >59)
Glucose: 111 mg/dL — ABNORMAL HIGH (ref 65–99)
Potassium: 4.3 mmol/L (ref 3.5–5.2)
Sodium: 140 mmol/L (ref 134–144)

## 2020-04-06 LAB — HEPATITIS C ANTIBODY: Hep C Virus Ab: <0.1 s/co ratio (ref 0.0–0.9)

## 2020-04-06 MED ORDER — SEMAGLUTIDE(0.25 OR 0.5MG/DOS) 2 MG/1.5ML ~~LOC~~ SOPN: 0.5000 mg | PEN_INJECTOR | SUBCUTANEOUS | 3 refills | Status: DC

## 2020-04-06 NOTE — Assessment & Plan Note (Signed)
BP Readings from Last 3 Encounters:  04/05/20 124/76  01/15/20 (!) 141/91  11/04/19 122/85   Blood pressure elevated at 152/105 but after given time to rest, 124/76 on re-check. Currently on lisinopril 20mg , HCTZ 25mg . Per chart review, over-due for bmp for electrolyte check while on the above anti-hypertensives. Denies any significant side effects.  - Bmp - C/w lisinopril 20mg , HCTZ 25mg 

## 2020-04-06 NOTE — Assessment & Plan Note (Addendum)
Presents w/ uncontrolled DM. 3 months since last check. Hemoglobin a1c 7.4 on 12/2019. Mentions adherence to regimen of metformin 1g BID, Semaglutide 0.25mg  weekly. Denies any significant side effects. Did not bring her glucometer to this visit.  - Will check hgb a1c - Currently on lowest possible dose of semaglutide. Likely need up-titration to 0.5mg  weekly for glycemic benefit if she is able to tolerate GI side effects.

## 2020-04-06 NOTE — Assessment & Plan Note (Signed)
Crystal English is a 54 yo F w/ PMH of fibromyalgia, T2Dm, HTN, HLD who presents to [facility] for management of her fibromyalgia. She mentions that she is having significant worsening pain over the last week without obvious inciting events. She mentions having pain in her shoulders and arms. She states in the past duloxetine and ibuprofen has been enough to control her symptoms but with this current exacerbation, pain is great enough to interfere with ADLs and IADLs, especially when reaching up to grab objects off the counter.  A/P Crystal English presents for worsening uncontrolled fibromyalgia. Per chart review, previously unable to tolerate gabapentin and was offered Flexeril in the past that she declined. On physical exam, diffuse myalgias with tenderness to palpation. ROM intact. Agreeable to trying muscle relaxants with these worsening symptoms.  - Start cyclobenzaprien 5mg  qhs - C/w duloxetine 60mg  daily (discussed increasing dosage of duloxetine but prefer not to make too many med changes at this time) - Encouraged exercise and weight loss

## 2020-04-06 NOTE — Assessment & Plan Note (Signed)
Agreeable to be screened for hepatitis C today

## 2020-04-06 NOTE — Telephone Encounter (Signed)
Discussed with [name] regarding her hemoglobin a1c, bmp, hepatitis C. Discussed no improvement in her a1c and option of increasing her Ozempic dose. [name] expressed understanding and is agreeable to increasing to 0.5mg . All other questions and concerns addressed.

## 2020-04-07 NOTE — Progress Notes (Signed)
Internal Medicine Clinic Attending ° °Case discussed with Dr. Lee at the time of the visit.  We reviewed the resident’s history and exam and pertinent patient test results.  I agree with the assessment, diagnosis, and plan of care documented in the resident’s note.  °

## 2020-04-08 ENCOUNTER — Ambulatory Visit (INDEPENDENT_AMBULATORY_CARE_PROVIDER_SITE_OTHER): Payer: Self-pay | Admitting: Family Medicine

## 2020-05-26 ENCOUNTER — Telehealth: Payer: Self-pay | Admitting: Student

## 2020-05-26 DIAGNOSIS — E119 Type 2 diabetes mellitus without complications: Secondary | ICD-10-CM

## 2020-05-26 MED ORDER — SEMAGLUTIDE(0.25 OR 0.5MG/DOS) 2 MG/1.5ML ~~LOC~~ SOPN: 0.5000 mg | PEN_INJECTOR | SUBCUTANEOUS | 3 refills | Status: DC

## 2020-05-26 NOTE — Telephone Encounter (Signed)
Pls contact pt regarding medicine [phone]

## 2020-05-26 NOTE — Telephone Encounter (Signed)
Return call to pt - stated she moved recently; person who cleaned her appt accidentally threw away her Ozempic. (Last rx written on 5/18 was "No Print"). She also has new insurance. Requesting new rx sent to [facility] on [address]. She will also see if she can use previous discount card. Thanks

## 2020-05-26 NOTE — Telephone Encounter (Signed)
Will send prescription for ozempic to [facility] on [address]. Thank you!

## 2020-06-07 ENCOUNTER — Other Ambulatory Visit: Payer: Self-pay | Admitting: Student

## 2020-06-07 DIAGNOSIS — M797 Fibromyalgia: Secondary | ICD-10-CM

## 2020-06-07 DIAGNOSIS — M7062 Trochanteric bursitis, left hip: Secondary | ICD-10-CM

## 2020-06-07 MED ORDER — CYCLOBENZAPRINE HCL 5 MG PO TABS: 5.0000 mg | ORAL_TABLET | Freq: Every day | ORAL | 0 refills | Status: DC

## 2020-06-07 MED ORDER — IBUPROFEN 800 MG PO TABS: 800.0000 mg | ORAL_TABLET | Freq: Three times a day (TID) | ORAL | 0 refills | Status: DC | PRN

## 2020-06-07 NOTE — Telephone Encounter (Signed)
Need refill on cyclobenzaprine (FLEXERIL) 5 MG tablet ibuprofen (ADVIL) 800 MG tablet ;pt contact [place]

## 2020-06-16 ENCOUNTER — Telehealth: Payer: Self-pay | Admitting: *Deleted

## 2020-06-16 NOTE — Telephone Encounter (Signed)
Information was sent through CoverMyMeds for PA for Ozempic .  Awaiting details of PA.  Sander Nephew, RN 06/16/2020 2:00 PM.

## 2020-06-25 ENCOUNTER — Telehealth: Payer: Self-pay | Admitting: *Deleted

## 2020-06-25 NOTE — Telephone Encounter (Signed)
Pt calls and states her grandson was recently diag with RSV, she had been caring for him recently because he was sick. Now she is feeling bad, no fevers she knows of, positive for sore throat, just not feeling well. She wonders if this could be r/t RSV. She does not want to spread this, could you please call her or advise. You may call her she would appreciate that. [phone]

## 2020-06-25 NOTE — Telephone Encounter (Signed)
Tried to call pt back at both ph#, no answer

## 2020-06-25 NOTE — Telephone Encounter (Signed)
I attempted to call [name], but no one answered. If she is having a sore throat ONLY, we recommend conservative treatment with fluids, rest, and tylenol for any pain/fever.   If the symptoms worsen or she develops shortness of breath, chest pain, trouble swallowing, worsening sore throat, fever, nausea, vomiting, diarrhea, please call our office and schedule an appointment. If she believes it is a medical emergency call 911.

## 2020-06-28 ENCOUNTER — Encounter: Payer: Self-pay | Admitting: Internal Medicine

## 2020-06-28 ENCOUNTER — Other Ambulatory Visit: Payer: Self-pay

## 2020-06-28 ENCOUNTER — Ambulatory Visit (INDEPENDENT_AMBULATORY_CARE_PROVIDER_SITE_OTHER): Payer: BC Managed Care – PPO | Admitting: Internal Medicine

## 2020-06-28 VITALS — BP 138/96 | HR 78 | Temp 98.6°F | Ht 61.0 in | Wt 221.0 lb

## 2020-06-28 DIAGNOSIS — R0989 Other specified symptoms and signs involving the circulatory and respiratory systems: Secondary | ICD-10-CM

## 2020-06-28 DIAGNOSIS — B338 Other specified viral diseases: Secondary | ICD-10-CM | POA: Insufficient documentation

## 2020-06-28 DIAGNOSIS — J45998 Other asthma: Secondary | ICD-10-CM | POA: Diagnosis not present

## 2020-06-28 DIAGNOSIS — B974 Respiratory syncytial virus as the cause of diseases classified elsewhere: Secondary | ICD-10-CM

## 2020-06-28 LAB — RESP PANEL BY RT PCR (RSV, FLU A&B, COVID)
Influenza A by PCR: NEGATIVE
Influenza B by PCR: NEGATIVE
Respiratory Syncytial Virus by PCR: POSITIVE — AB
SARS Coronavirus 2 by RT PCR: NEGATIVE

## 2020-06-28 NOTE — Progress Notes (Signed)
   CC: Upper respiratory symptoms, exposure to RSV  HPI:  [name] is a 54 y.o. female with PMHx as documented below, presented with upper respiratory symptoms and because she is concerned she might have RSV. Please refer to problem based charting for further details and assessment and plan of current problem and chronic medical conditions.  PMHx: Migraine, HTN, OSA, DM, urticaria, Hx of COVID-19, Macroglossia, obesity, fibromyalgia  Medications: Albuterol, present Flexeril, duloxetine, Pepcid, Flonase, ibuprofen, lisinopril-HCTZ 20-25 mg daily, Metformin 1000 mg daily, semaglutide 0.5 mg once a week, Protonix,   Past Medical History:  Diagnosis Date  . Asthma    Required inhaled corticosteroid in past.   . Depression, major 02/25/2013   First episode that required was 2014. Prior episodes that did not require pt to seek medical tx.  . Hypertension    2 drug therapy  . Low back pain 08/10/2016  . Nausea 09/19/2018  . OSA (obstructive sleep apnea)    Sleep study 12/2006 : Moderately Severe OSA. AHI 38.8. O2 sat decreased to 71%.  . Severe obesity (BMI >= 40) (HCC) 10/08/2012   Obesity Class 3. BMI > 40.   . Strain of flexor muscle of right hip 09/19/2018  . Tobacco abuse    Review of Systems:  Review of Systems  Constitutional: Negative for chills and fever.  HENT: Positive for congestion and sore throat.   Respiratory: Negative for cough, sputum production and shortness of breath.   Cardiovascular: Negative for chest pain.    Physical Exam:  Vitals:   06/28/20 1409  BP: (!) 138/96  Pulse: 78  Temp: 98.6 F (37 C)  TempSrc: Oral  SpO2: 99%  Weight: 221 lb (100.2 kg)  Height: 5\' 1"  (1.549 m)    Constitutional: Well-developed and well-nourished. No acute distress.  HENT:  Head: Normocephalic and atraumatic.  Oropharyngeal: No erythema, no exudate Cardiovascular:  RRR, nl S1S2, no murmur, no LEE Respiratory: Effort normal and breath sounds normal. No  respiratory distress. No wheezes.  GI: Soft. Bowel sounds are normal. No distension. There is no tenderness.  Neurological: Is alert and oriented x 3  Skin: Not diaphoretic. No erythema.  Psychiatric:  Normal mood and affect. Behavior is normal. Judgment and thought content normal.   Assessment & Plan:   See Encounters Tab for problem based charting.  Patient discussed with Dr. Rebeca Alert    .

## 2020-06-28 NOTE — Telephone Encounter (Signed)
Requesting to speak with a nurse about RSV. Please call pt back.

## 2020-06-28 NOTE — Patient Instructions (Signed)
Thank you for allowing [place] to provide your care today.  You came in due to runny nose, congestion after exposure to RSV. You Today we tested you for Covid-19, flu and and also RSV.  We will call you for the result. Please stay home until you get a result. Use mask if you have family member around you.  Drink plenty of water.   Today we made no changes to your medications.    Please come back to clinic as needed if your symptoms get worse or not improved. As always, if having severe symptoms, please seek medical attention at emergency room. Follow up with your primary care. Should you have any questions or concerns please call the internal medicine clinic at [phone].    Thank you!

## 2020-06-28 NOTE — Telephone Encounter (Signed)
rtc to pt, mailbox full, will try later

## 2020-06-28 NOTE — Addendum Note (Signed)
Addended byDewayne Hatch on: 06/28/2020 05:30 PM   Modules accepted: Level of Service

## 2020-06-28 NOTE — Assessment & Plan Note (Signed)
Symptoms are controled. She denies SOB. -Continue Albuterol PRN

## 2020-06-28 NOTE — Assessment & Plan Note (Addendum)
Patient reports upper respiratory symptoms that started 3 days ago. She was exposed to her 33-month grandson who was diagnosed with RSV 4 days ago. She states that, 3 days ago, she experienced some sore throat. The sore throat resolved within 24h but she then developed some nasal congestion and runny nose.  She felt cold but did not have any fever. No cough and no new body ache. No diarrhea. Her symptoms got better yesterday but she was asked by her employee to be seen by a doctor, before she goes back to work.  She is afebrile and her physical exam is unremarkable. Given recent symptoms and to clear patient for work, will check her for RSV and also COVID-19, given she has not received COVID vaccine. Of note, she was diagnosed with COVID-19 on December 2020. She did not require hospitalization then.   -Obtained nasopharyngeal swab for COVID and RSV.  -Provided excute note for tomorrow (or until her blood work come back.)  ADDENDUM: Resp panel came back positive for RSV. I called patient and informed her about the result. I asked her to use contact and standard precautions until resolution of symptoms.  She still has some runny nose today.  -Will continue supportive care.  -She will stay home today and tomorrow, and will call us if still symptomatic after that and we will provide a new letter for work.

## 2020-06-28 NOTE — Telephone Encounter (Signed)
Pt is currently being seen in our office by Dr Myrtie Hawk.

## 2020-06-29 NOTE — Progress Notes (Signed)
Internal Medicine Clinic Attending  Case discussed with Dr. Masoudi at the time of the visit.  We reviewed the resident's history and exam and pertinent patient test results.  I agree with the assessment, diagnosis, and plan of care documented in the resident's note.  Kjirsten Bloodgood, M.D., Ph.D.  

## 2020-07-09 ENCOUNTER — Telehealth: Payer: Self-pay | Admitting: Student

## 2020-07-09 NOTE — Telephone Encounter (Signed)
Pt requesting a nurse call back.  Pt states she was instructed to call her Dr.'s office about a possible exposure to [place].  Please call patient back.

## 2020-07-09 NOTE — Telephone Encounter (Signed)
Return pt's call - stated her supervisor's husband tested positive (pt was told this morning); the supervisor went to get tested this am; pt was being trained by the supervisor (close contact). She was here 8/9 saw Dr Myrtie Hawk - tested + for RSV but Covid negative. Stated  Has runny nose, slight cough, no fever, sorethroat. She wants to know if should be tested again for covid? Please advise. Thanks

## 2020-07-22 ENCOUNTER — Telehealth: Payer: Self-pay | Admitting: *Deleted

## 2020-07-22 ENCOUNTER — Other Ambulatory Visit: Payer: Self-pay | Admitting: Internal Medicine

## 2020-07-22 ENCOUNTER — Other Ambulatory Visit: Payer: Self-pay | Admitting: Student

## 2020-07-22 DIAGNOSIS — E119 Type 2 diabetes mellitus without complications: Secondary | ICD-10-CM

## 2020-07-22 MED ORDER — METFORMIN HCL ER (MOD) 1000 MG PO TB24: 1000.0000 mg | ORAL_TABLET | Freq: Two times a day (BID) | ORAL | 3 refills | Status: DC

## 2020-07-22 NOTE — Telephone Encounter (Signed)
Need to change metformin to reg metformin er 500mg  2 tabs by mouth 2 times daily for $10.00 If glumetza remains it is $3,000.00 Could we change this?

## 2020-07-22 NOTE — Telephone Encounter (Signed)
Pt need all her medicines sent to pharmacy below; she is need metFORMIN (GLUMETZA) 1000 MG (MOD) 24 hr tablet   [place], [place]   Pt is requesting a nurse to call back regarding dizzy spells; [phone]

## 2020-07-23 ENCOUNTER — Other Ambulatory Visit: Payer: Self-pay | Admitting: Internal Medicine

## 2020-07-23 DIAGNOSIS — E119 Type 2 diabetes mellitus without complications: Secondary | ICD-10-CM

## 2020-07-23 MED ORDER — METFORMIN HCL 1000 MG PO TABS: 1000.0000 mg | ORAL_TABLET | Freq: Two times a day (BID) | ORAL | 11 refills | Status: DC

## 2020-07-23 NOTE — Telephone Encounter (Signed)
Changed to regular metformin 1000mg  twice a day.

## 2020-07-23 NOTE — Telephone Encounter (Signed)
Pt calls and states she has been dizzy when she goes to bed and lays down for appr 5 nights. She is offered an appt but states she will rest this weekend and call Tuesday if it continues . She also ask about response to her metformin. Please f/u. Thank you She is cautioned if she starts to feel worse to call 911 and she is agreeable

## 2020-08-03 ENCOUNTER — Telehealth: Payer: Self-pay | Admitting: Student

## 2020-08-03 ENCOUNTER — Other Ambulatory Visit: Payer: Self-pay | Admitting: Internal Medicine

## 2020-08-03 DIAGNOSIS — M797 Fibromyalgia: Secondary | ICD-10-CM

## 2020-08-03 NOTE — Telephone Encounter (Signed)
Pls contact pt regarding medication problems [phone]

## 2020-08-03 NOTE — Telephone Encounter (Signed)
Called pt - no answer; left message to call the office if she still have questions. 

## 2020-08-04 ENCOUNTER — Other Ambulatory Visit: Payer: Self-pay | Admitting: Internal Medicine

## 2020-08-04 DIAGNOSIS — E119 Type 2 diabetes mellitus without complications: Secondary | ICD-10-CM

## 2020-08-04 MED ORDER — METFORMIN HCL ER 500 MG PO TB24: 1000.0000 mg | ORAL_TABLET | Freq: Two times a day (BID) | ORAL | 2 refills | Status: DC

## 2020-08-04 NOTE — Telephone Encounter (Signed)
Pls contact pt [phone]

## 2020-08-04 NOTE — Telephone Encounter (Addendum)
Pt had called back to schedule an appt for vertigo on 9/27. Also I called to let her know of new Metformin rx  - no answer; left message on her vm.

## 2020-08-04 NOTE — Telephone Encounter (Signed)
Return pt's call - stated when she took Metformin yesterday, she threw up; wants to know is there something else she can take or switch her back to the 24 hr (the one Dr Tarri Abernethy prescribed). Also c/o dizziness mainly when she's lying down; informed she will need an appt. Call transfer to front desk; somehow disconnected - called pt back,went to vm, left message to call the office.

## 2020-08-04 NOTE — Telephone Encounter (Signed)
Hi Glenda,   I have switched her metformin to extended release. Thanks

## 2020-08-06 ENCOUNTER — Telehealth: Payer: Self-pay

## 2020-08-06 NOTE — Telephone Encounter (Signed)
Thanks

## 2020-08-06 NOTE — Telephone Encounter (Signed)
Received TC from patient, she states she accidentally took 2000mg  metformin twice daily earlier this week, RX is for 1000mg  twice daily.  States she became dizzy and didn't feel well and is asking if there are "any long term side effects she should be worried about"? RN informed patient as long as she was able to eat and bring her BS back up and is now taking the correct dosage, she should be fine.  Pt denies any dizziness, tiredness, n/v and states she feels fine now.  Does verbalize she is taking 1000mg  twice daily.  Patient has an appt with Dr. Collene Gobble on 08/16/20 to discuss, she was encouraged to keep this appt. SChaplin, RN,BSN

## 2020-08-10 ENCOUNTER — Other Ambulatory Visit: Payer: Self-pay

## 2020-08-10 ENCOUNTER — Encounter: Payer: Self-pay | Admitting: Internal Medicine

## 2020-08-10 ENCOUNTER — Ambulatory Visit (INDEPENDENT_AMBULATORY_CARE_PROVIDER_SITE_OTHER): Payer: BC Managed Care – PPO | Admitting: Internal Medicine

## 2020-08-10 VITALS — BP 121/84 | HR 94 | Temp 98.3°F | Ht 61.0 in | Wt 217.5 lb

## 2020-08-10 DIAGNOSIS — N179 Acute kidney failure, unspecified: Secondary | ICD-10-CM

## 2020-08-10 DIAGNOSIS — H8113 Benign paroxysmal vertigo, bilateral: Secondary | ICD-10-CM | POA: Diagnosis not present

## 2020-08-10 DIAGNOSIS — M62838 Other muscle spasm: Secondary | ICD-10-CM

## 2020-08-10 DIAGNOSIS — M5010 Cervical disc disorder with radiculopathy, unspecified cervical region: Secondary | ICD-10-CM | POA: Diagnosis not present

## 2020-08-10 DIAGNOSIS — H8110 Benign paroxysmal vertigo, unspecified ear: Secondary | ICD-10-CM | POA: Diagnosis not present

## 2020-08-10 MED ORDER — BACLOFEN 10 MG PO TABS: 10.0000 mg | ORAL_TABLET | Freq: Three times a day (TID) | ORAL | 0 refills | Status: AC

## 2020-08-10 NOTE — Patient Instructions (Addendum)
It was nice meeting you today. I think your dizziness is due to BPPV. I have attached some education about it. I have also placed a referral to vestibular rehab to help with this. I also sent in a 2 week supply of baclofen which is a muscle relaxer. I am hopeful that it will help with your pain.  Please arrange a separate appointment for follow up on your chronic medical conditions. Please follow up if symptoms do not improve. Benign Positional Vertigo Vertigo is the feeling that you or your surroundings are moving when they are not. Benign positional vertigo is the most common form of vertigo. This is usually a harmless condition (benign). This condition is positional. This means that symptoms are triggered by certain movements and positions. This condition can be dangerous if it occurs while you are doing something that could cause harm to you or others. This includes activities such as driving or operating machinery. What are the causes? In many cases, the cause of this condition is not known. It may be caused by a disturbance in an area of the inner ear that helps your brain to sense movement and balance. This disturbance can be caused by:  Viral infection (labyrinthitis).  Head injury.  Repetitive motion, such as jumping, dancing, or running. What increases the risk? You are more likely to develop this condition if:  You are a woman.  You are 55 years of age or older. What are the signs or symptoms? Symptoms of this condition usually happen when you move your head or your eyes in different directions. Symptoms may start suddenly, and usually last for less than a minute. They include:  Loss of balance and falling.  Feeling like you are spinning or moving.  Feeling like your surroundings are spinning or moving.  Nausea and vomiting.  Blurred vision.  Dizziness.  Involuntary eye movement (nystagmus). Symptoms can be mild and cause only minor problems, or they can be severe and  interfere with daily life. Episodes of benign positional vertigo may return (recur) over time. Symptoms may improve over time. How is this diagnosed? This condition may be diagnosed based on:  Your medical history.  Physical exam of the head, neck, and ears.  Tests, such as: ? MRI. ? CT scan. ? Eye movement tests. Your health care provider may ask you to change positions quickly while he or she watches you for symptoms of benign positional vertigo, such as nystagmus. Eye movement may be tested with a variety of exams that are designed to evaluate or stimulate vertigo. ? An electroencephalogram (EEG). This records electrical activity in your brain. ? Hearing tests. You may be referred to a health care provider who specializes in ear, nose, and throat (ENT) problems (otolaryngologist) or a provider who specializes in disorders of the nervous system (neurologist). How is this treated?  This condition may be treated in a session in which your health care provider moves your head in specific positions to adjust your inner ear back to normal. Treatment for this condition may take several sessions. Surgery may be needed in severe cases, but this is rare. In some cases, benign positional vertigo may resolve on its own in 2-4 weeks. Follow these instructions at home: Safety  Move slowly. Avoid sudden body or head movements or certain positions, as told by your health care provider.  Avoid driving until your health care provider says it is safe for you to do so.  Avoid operating heavy machinery until your health care  provider says it is safe for you to do so.  Avoid doing any tasks that would be dangerous to you or others if vertigo occurs.  If you have trouble walking or keeping your balance, try using a cane for stability. If you feel dizzy or unstable, sit down right away.  Return to your normal activities as told by your health care provider. Ask your health care provider what activities  are safe for you. General instructions  Take over-the-counter and prescription medicines only as told by your health care provider.  Drink enough fluid to keep your urine pale yellow.  Keep all follow-up visits as told by your health care provider. This is important. Contact a health care provider if:  You have a fever.  Your condition gets worse or you develop new symptoms.  Your family or friends notice any behavioral changes.  You have nausea or vomiting that gets worse.  You have numbness or a "pins and needles" sensation. Get help right away if you:  Have difficulty speaking or moving.  Are always dizzy.  Faint.  Develop severe headaches.  Have weakness in your legs or arms.  Have changes in your hearing or vision.  Develop a stiff neck.  Develop sensitivity to light. Summary  Vertigo is the feeling that you or your surroundings are moving when they are not. Benign positional vertigo is the most common form of vertigo.  The cause of this condition is not known. It may be caused by a disturbance in an area of the inner ear that helps your brain to sense movement and balance.  Symptoms include loss of balance and falling, feeling that you or your surroundings are moving, nausea and vomiting, and blurred vision.  This condition can be diagnosed based on symptoms, physical exam, and other tests, such as MRI, CT scan, eye movement tests, and hearing tests.  Follow safety instructions as told by your health care provider. You will also be told when to contact your health care provider in case of problems. This information is not intended to replace advice given to you by your health care provider. Make sure you discuss any questions you have with your health care provider. Document Revised: 04/17/2018 Document Reviewed: 04/17/2018 Elsevier Patient Education  [place].

## 2020-08-11 DIAGNOSIS — N179 Acute kidney failure, unspecified: Secondary | ICD-10-CM | POA: Insufficient documentation

## 2020-08-11 DIAGNOSIS — H8110 Benign paroxysmal vertigo, unspecified ear: Secondary | ICD-10-CM | POA: Insufficient documentation

## 2020-08-11 LAB — BASIC METABOLIC PANEL
BUN/Creatinine Ratio: 10 (ref 9–23)
BUN: 14 mg/dL (ref 6–24)
CO2: 23 mmol/L (ref 20–29)
Calcium: 9.7 mg/dL (ref 8.7–10.2)
Chloride: 99 mmol/L (ref 96–106)
Creatinine, Ser: 1.34 mg/dL — ABNORMAL HIGH (ref 0.57–1.00)
GFR calc Af Amer: 51 mL/min/{1.73_m2} — ABNORMAL LOW (ref >59)
GFR calc non Af Amer: 45 mL/min/{1.73_m2} — ABNORMAL LOW (ref >59)
Glucose: 143 mg/dL — ABNORMAL HIGH (ref 65–99)
Potassium: 4.4 mmol/L (ref 3.5–5.2)
Sodium: 142 mmol/L (ref 134–144)

## 2020-08-11 NOTE — Addendum Note (Signed)
Addended by: Mitzi Hansen on: 08/11/2020 12:22 PM   Modules accepted: Orders

## 2020-08-11 NOTE — Assessment & Plan Note (Addendum)
Patient is presenting to the clinic today for evaluation of 3 to 4-week history of of positional vertigo. Orthostatic vital signs are negative.  Neuro exam is nonconcerning. History of present illness most consistent with BPPV. Discussed diagnosis and plan. Plan --We will obtain a BMP today to rule out any electrolyte abnormalities --Referral placed to vestibular rehab --Patient encouraged to follow-up in 4 to 6 weeks for this

## 2020-08-11 NOTE — Assessment & Plan Note (Addendum)
I obtained a BMP today to check her electrolytes for evaluation of the vertigo.  Electrolytes are stable but labs did show a bump in her creatinine from baseline. I spoke with her about this and she admits to poor hydration. Dehydration may be contributing to BPPV and muscle pain. Plan: encouraged oral hydration over the next couple weeks. F/u in 2-4w for repeat BMP

## 2020-08-11 NOTE — Assessment & Plan Note (Signed)
Patient is presenting to the clinic today for evaluation of 3 to 4-week history of worsening neck pain. History of present illness and physical exam are most consistent with muscular strain.  Considered potential relation to her new job.   Plan --We discussed that since she is already on Flexeril, and this is not helping, we can try switching to baclofen for 2 weeks.  Reiterated that she should not take this at the same time as Flexeril. --Discussed conservative measures including stretching and relaxation techniques --Discussed trying to see if [facility] has an employee ergonomic health provider for evaluation of her workstation. --Referral placed for physical therapy as I think this could be beneficial for both her current symptoms as well as her chronic fibromyalgia

## 2020-08-11 NOTE — Progress Notes (Addendum)
Office Visit   Patient ID: Crystal English, female    DOB: 1966-04-07, 54 y.o.   MRN: [id number]  Subjective:  CC: dizziness  HPI 54 y.o. presents today for evaluation 4-week history of dizziness.  Primarily positional and lasts about 6 seconds after changing positions.  Described as the room spinning around.  She notes prior history of this several years ago at which time she was given an unknown medication and ended up having to take a leave of absence from work for a few months symptoms.  Symptoms had spontaneously resolved on their own at that time.  She denies any new neurologic symptoms including vision changes, focal weaknesses, balance issues.  She denies.  She does also want to discuss some neck pain that she has been having.  She notes that it has been interfering with her sleep.  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  She has been doing that now for about 3 months.  She notes that she did have some issues initially with ergonomic or positioning however that was resolved. Pain is exacerbated with neck movement.  Pain is worse with attempting to rotate her head to the right. She does also note a longstanding history of fibromyalgia.  She notes that she has been told that she has tense muscles. She has been taking Flexeril for several years to help with sleep however she notes that it does not help with her muscle tightness. She denies any systemic symptoms including fever, chills, nausea, vomiting.      ACTIVE MEDICATIONS   Current Outpatient Medications on File Prior to Visit  Medication Sig Dispense Refill  . albuterol (PROVENTIL HFA;VENTOLIN HFA) 108 (90 Base) MCG/ACT inhaler Inhale 2 puffs into the lungs every 6 (six) hours as needed for wheezing. 1 Inhaler 0  . Blood Glucose Monitoring Suppl (ONETOUCH VERIO) w/Device KIT PLEASE CHECK BLOOD SUGAR 3 TIMES DAILY 1 kit 0  . cetirizine (ZYRTEC) 10 MG chewable tablet Chew 1 tablet (10 mg  total) by mouth daily as needed for allergies or rhinitis. 30 tablet 1  . cyclobenzaprine (FLEXERIL) 5 MG tablet TAKE 1 TO 2 TABLETS BY MOUTH EVERY DAY AT BEDTIME 30 tablet 0  . DULoxetine (CYMBALTA) 60 MG capsule Take 1 capsule (60 mg total) by mouth daily. 90 capsule 3  . famotidine (PEPCID) 20 MG tablet Take 1 tablet (20 mg total) by mouth 2 (two) times daily. 180 tablet 3  . fluticasone (FLONASE) 50 MCG/ACT nasal spray Place 2 sprays into both nostrils daily. 16 g 0  . glucose blood test strip Please check blood sugars 3 times daily 100 each 12  . ibuprofen (ADVIL) 800 MG tablet Take 1 tablet (800 mg total) by mouth every 8 (eight) hours as needed for moderate pain. 30 tablet 0  . Lancets (ONETOUCH ULTRASOFT) lancets Please check blood sugars 3 times daily 100 each 12  . lisinopril-hydrochlorothiazide (ZESTORETIC) 20-25 MG tablet TAKE 1 TABLET BY MOUTH DAILY. 90 tablet 2  . metFORMIN (GLUCOPHAGE-XR) 500 MG 24 hr tablet Take 2 tablets (1,000 mg total) by mouth 2 (two) times daily. 120 tablet 2  . pantoprazole (PROTONIX) 20 MG tablet Take 1 tablet (20 mg total) by mouth daily. 30 tablet 2  . Semaglutide,0.25 or 0.5MG/DOS, 2 MG/1.5ML SOPN Inject 0.375 mLs (0.5 mg total) into the skin once a week. 4.5 mL 3  . SUMAtriptan (IMITREX) 100 MG tablet Take 1 tablet (100 mg total) by mouth once as  needed for migraine. May repeat in 2 hours if headache persists or recurs. 15 tablet 3   No current facility-administered medications on file prior to visit.    ROS  Review of Systems  Constitutional: Negative for chills and fever.  HENT: Negative for ear pain and hearing loss.   Eyes: Negative for visual disturbance.  Musculoskeletal: Positive for neck pain and neck stiffness. Negative for gait problem.  Neurological: Positive for dizziness. Negative for syncope, weakness and headaches.    Objective:   BP 121/84 (BP Location: Left Arm, Patient Position: Sitting, Cuff Size: Normal)   Pulse 94   Temp  98.3 F (36.8 C) (Oral)   Ht 5' 1"  (1.549 m)   Wt 217 lb 8 oz (98.7 kg)   LMP 03/20/2014   SpO2 99%   BMI 41.10 kg/m  Wt Readings from Last 3 Encounters:  08/10/20 217 lb 8 oz (98.7 kg)  06/28/20 221 lb (100.2 kg)  04/05/20 227 lb 3.2 oz (103.1 kg)   BP Readings from Last 3 Encounters:  08/10/20 121/84  06/28/20 (!) 138/96  04/05/20 124/76   Physical Exam Constitutional:      Appearance: Normal appearance.  HENT:     Head: Atraumatic.  Cardiovascular:     Rate and Rhythm: Normal rate and regular rhythm.  Pulmonary:     Effort: Pulmonary effort is normal.     Breath sounds: Normal breath sounds.  Musculoskeletal:     Comments: Neck: no deformities noted. ROM limited by pain. Pain is worse with head rotation to the right. Pain is reproduced with palpation of the paraspinal and trapezius muscles. No focal spinal tenderness.   Neurological:     General: No focal deficit present.     Cranial Nerves: No facial asymmetry.     Comments: CN II-XII grossly intact. No nystagmus notable with head repositioning. Negative rhomberg test. No gait abnormality appreciable. Muscle strength of the bilateral upper and lower extremities 5/5. Sensation equal and intact to the bilateral hands. No appreciable focal deficit.      Health Maintenance:   Health Maintenance  Topic Date Due  . COVID-19 Vaccine (1) Never done  . COLONOSCOPY  Never done  . PAP SMEAR-Modifier  12/24/2019  . FOOT EXAM  12/27/2019  . OPHTHALMOLOGY EXAM  05/06/2020  . INFLUENZA VACCINE  06/20/2020  . PNEUMOCOCCAL POLYSACCHARIDE VACCINE AGE 32-64 HIGH RISK  01/14/2021 (Originally 01/13/1967)  . TETANUS/TDAP  01/14/2021 (Originally 01/14/1984)  . HEMOGLOBIN A1C  10/06/2020  . MAMMOGRAM  01/16/2021  . Hepatitis C Screening  Completed  . HIV Screening  Completed     Assessment & Plan:   Problem List Items Addressed This Visit      Nervous and Auditory   Cervical disc disorder with radiculopathy of cervical region     Patient is presenting to the clinic today for evaluation of 3 to 4-week history of worsening neck pain. History of present illness and physical exam are most consistent with muscular strain.  Considered potential relation to her new job.   Plan --We discussed that since she is already on Flexeril, and this is not helping, we can try switching to baclofen for 2 weeks.  Reiterated that she should not take this at the same time as Flexeril. --Discussed conservative measures including stretching and relaxation techniques --Discussed trying to see if [facility] has an employee ergonomic health provider for evaluation of her workstation. --Referral placed for physical therapy as I think this could be beneficial for both  her current symptoms as well as her chronic fibromyalgia      Relevant Medications   baclofen (LIORESAL) 10 MG tablet   BPPV (benign paroxysmal positional vertigo) - Primary    Patient is presenting to the clinic today for evaluation of 3 to 4-week history of of positional vertigo. Orthostatic vital signs are negative.  Neuro exam is nonconcerning. History of present illness most consistent with BPPV. Discussed diagnosis and plan. Plan --We will obtain a BMP today to rule out any electrolyte abnormalities --Referral placed to vestibular rehab --Patient encouraged to follow-up in 4 to 6 weeks for this      Relevant Orders   Basic metabolic panel (Completed)   PT vestibular rehab     Genitourinary   AKI (acute kidney injury) ([place])    I obtained a BMP today to check her electrolytes for evaluation of the vertigo.  Electrolytes are stable but labs did show a bump in her creatinine from baseline. I spoke with her about this and she admits to poor hydration. Plan: encouraged oral hydration over the next couple weeks. F/u in 2-4w for repeat BMP       Other Visit Diagnoses    Muscle spasm       Relevant Orders   Ambulatory referral to Physical Therapy        Pt  discussed with Dr. Forde Radon, MD Internal Medicine Resident PGY-2 [facility] Pager: [phone] 08/11/2020 12:02 PM

## 2020-08-12 NOTE — Progress Notes (Signed)
Internal Medicine Clinic Attending  Case discussed with Dr. Christian  At the time of the visit.  We reviewed the resident's history and exam and pertinent patient test results.  I agree with the assessment, diagnosis, and plan of care documented in the resident's note.  

## 2020-08-16 ENCOUNTER — Encounter: Payer: BC Managed Care – PPO | Admitting: Student

## 2020-08-18 ENCOUNTER — Encounter: Payer: Self-pay | Admitting: *Deleted

## 2020-08-20 ENCOUNTER — Other Ambulatory Visit: Payer: Self-pay | Admitting: Student

## 2020-08-20 ENCOUNTER — Other Ambulatory Visit: Payer: Self-pay

## 2020-08-20 DIAGNOSIS — I10 Essential (primary) hypertension: Secondary | ICD-10-CM

## 2020-08-20 DIAGNOSIS — M797 Fibromyalgia: Secondary | ICD-10-CM

## 2020-08-20 MED ORDER — LISINOPRIL-HYDROCHLOROTHIAZIDE 20-25 MG PO TABS: 1.0000 | ORAL_TABLET | Freq: Every day | ORAL | 2 refills | Status: DC

## 2020-08-20 MED ORDER — DULOXETINE HCL 60 MG PO CPEP: 60.0000 mg | ORAL_CAPSULE | Freq: Every day | ORAL | 3 refills | Status: DC

## 2020-08-20 NOTE — Telephone Encounter (Signed)
Called pt - no cymbalta refills ; stated she had changed pharmacy. Only has 1 pill left. Stated she received test results today; had questions ("I can figure it out" and will call back if she has qustions) but she will schedule an appt later.

## 2020-08-20 NOTE — Telephone Encounter (Signed)
Need refill on DULoxetine (CYMBALTA) 60 MG capsule(Expired) ,pt contact [place], [place] contact pt regarding results [id number]

## 2020-08-20 NOTE — Telephone Encounter (Signed)
lisinopril-hydrochlorothiazide (ZESTORETIC) 20-25 MG tablet, REFILL REQUEST @  [place], [address] Phone:  [phone]  Fax:  [phone]

## 2020-09-06 DIAGNOSIS — E1165 Type 2 diabetes mellitus with hyperglycemia: Secondary | ICD-10-CM | POA: Diagnosis not present

## 2020-09-06 DIAGNOSIS — N179 Acute kidney failure, unspecified: Secondary | ICD-10-CM | POA: Diagnosis not present

## 2020-09-06 DIAGNOSIS — F419 Anxiety disorder, unspecified: Secondary | ICD-10-CM | POA: Diagnosis not present

## 2020-09-06 DIAGNOSIS — H8110 Benign paroxysmal vertigo, unspecified ear: Secondary | ICD-10-CM | POA: Diagnosis not present

## 2020-09-16 NOTE — Addendum Note (Signed)
Addended by: Hulan Fray on: 09/16/2020 06:53 PM   Modules accepted: Orders

## 2020-09-22 ENCOUNTER — Other Ambulatory Visit: Payer: Self-pay

## 2020-09-22 ENCOUNTER — Encounter: Payer: BC Managed Care – PPO | Admitting: Student

## 2020-09-30 DIAGNOSIS — G4733 Obstructive sleep apnea (adult) (pediatric): Secondary | ICD-10-CM | POA: Diagnosis not present

## 2020-09-30 DIAGNOSIS — Z0000 Encounter for general adult medical examination without abnormal findings: Secondary | ICD-10-CM | POA: Diagnosis not present

## 2020-09-30 DIAGNOSIS — E1165 Type 2 diabetes mellitus with hyperglycemia: Secondary | ICD-10-CM | POA: Diagnosis not present

## 2020-09-30 DIAGNOSIS — Z13220 Encounter for screening for lipoid disorders: Secondary | ICD-10-CM | POA: Diagnosis not present

## 2020-09-30 DIAGNOSIS — H8110 Benign paroxysmal vertigo, unspecified ear: Secondary | ICD-10-CM | POA: Diagnosis not present

## 2020-10-21 DIAGNOSIS — Z01419 Encounter for gynecological examination (general) (routine) without abnormal findings: Secondary | ICD-10-CM | POA: Diagnosis not present

## 2020-10-21 DIAGNOSIS — Z1231 Encounter for screening mammogram for malignant neoplasm of breast: Secondary | ICD-10-CM | POA: Diagnosis not present

## 2020-10-21 DIAGNOSIS — Z6841 Body Mass Index (BMI) 40.0 and over, adult: Secondary | ICD-10-CM | POA: Diagnosis not present

## 2020-11-02 DIAGNOSIS — J309 Allergic rhinitis, unspecified: Secondary | ICD-10-CM | POA: Diagnosis not present

## 2020-11-02 DIAGNOSIS — M25531 Pain in right wrist: Secondary | ICD-10-CM | POA: Diagnosis not present

## 2021-03-26 ENCOUNTER — Other Ambulatory Visit: Payer: Self-pay | Admitting: Internal Medicine

## 2021-03-26 DIAGNOSIS — E119 Type 2 diabetes mellitus without complications: Secondary | ICD-10-CM

## 2021-03-28 NOTE — Telephone Encounter (Signed)
Patient will need an office visit for diabetes re-evaluation. Last a1c was 11 months ago.

## 2021-04-01 ENCOUNTER — Encounter: Payer: Self-pay | Admitting: Student

## 2021-04-01 ENCOUNTER — Telehealth: Payer: Self-pay | Admitting: Student

## 2021-04-01 NOTE — Telephone Encounter (Signed)
Unable to reach this patient.  A letter has been mailed to the patient to call the clinic and sch an appt as soon as possible.

## 2021-04-01 NOTE — Telephone Encounter (Signed)
Opened in error

## 2021-05-24 ENCOUNTER — Encounter: Payer: Self-pay | Admitting: *Deleted

## 2021-07-30 ENCOUNTER — Ambulatory Visit (HOSPITAL_COMMUNITY)
Admission: EM | Admit: 2021-07-30 | Discharge: 2021-07-30 | Disposition: A | Discharge status: Home / Self Care | Payer: 59 | Attending: Emergency Medicine | Admitting: Emergency Medicine

## 2021-07-30 ENCOUNTER — Other Ambulatory Visit: Payer: Self-pay

## 2021-07-30 ENCOUNTER — Encounter (HOSPITAL_COMMUNITY): Payer: Self-pay | Admitting: *Deleted

## 2021-07-30 DIAGNOSIS — J309 Allergic rhinitis, unspecified: Secondary | ICD-10-CM | POA: Diagnosis present

## 2021-07-30 DIAGNOSIS — Z20822 Contact with and (suspected) exposure to covid-19: Secondary | ICD-10-CM | POA: Diagnosis not present

## 2021-07-30 LAB — SARS CORONAVIRUS 2 (TAT 6-24 HRS): SARS Coronavirus 2: NEGATIVE

## 2021-07-30 MED ORDER — FLUTICASONE PROPIONATE 50 MCG/ACT NA SUSP: 2.0000 | Freq: Every day | NASAL | 0 refills | Status: DC

## 2021-07-30 MED ORDER — CETIRIZINE HCL 10 MG PO CHEW: 10.0000 mg | CHEWABLE_TABLET | Freq: Every day | ORAL | 0 refills | Status: DC

## 2021-07-30 NOTE — ED Triage Notes (Signed)
Pt reports Sx's started on Tuesday. Pt reports sore throat and chills. Pt tested neg for COVID and FLU on wed. Pt went to Work on Thursday.

## 2021-07-30 NOTE — Discharge Instructions (Addendum)
Please resume Flonase and Zyrtec.  Once your symptoms improve, you can discontinue Zyrtec and continue Flonase for ongoing control of your allergic rhinitis.

## 2021-07-30 NOTE — ED Provider Notes (Signed)
[place]    CSN: [id number] Arrival date & time: 07/30/21  1014      History   Chief Complaint Chief Complaint  Patient presents with   Sore Throat   Chills    HPI Crystal English is a 55 y.o. female.   Patient reports symptom symptoms including sore throat, subjective fever and fatigue that started about a week ago but states that they have nearly resolved at this time.  Patient politely declines strep test today.  Patient reports a history of allergies, not currently on any treatment for this.   Sore Throat This is a new problem. The current episode started more than 2 days ago. The problem occurs constantly. The problem has been rapidly improving. Pertinent negatives include no chest pain, no abdominal pain, no headaches and no shortness of breath. Nothing aggravates the symptoms. Nothing relieves the symptoms. She has tried nothing for the symptoms.   Past Medical History:  Diagnosis Date   Asthma    Required inhaled corticosteroid in past.    Depression, major 02/25/2013   First episode that required was 2014. Prior episodes that did not require pt to seek medical tx.   Hypertension    2 drug therapy   Low back pain 08/10/2016   Nausea 09/19/2018   OSA (obstructive sleep apnea)    Sleep study 12/2006 : Moderately Severe OSA. AHI 38.8. O2 sat decreased to 71%.   Severe obesity (BMI >= 40) (HCC) 10/08/2012   Obesity Class 3. BMI > 40.    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   Adjustment disorder with anxious mood 11/04/2019   Anxiety 04/03/2019   Urge incontinence 10/12/2018   Dyspepsia 09/19/2018   Fibromyalgia 04/14/2018   Allergic rhinitis 05/04/2017   Migraine 08/10/2016   Diabetes mellitus ([place])  02/14/2016   Perimenopause 12/23/2014   Chronic urticaria 04/08/2014   Cervical disc disorder with radiculopathy of cervical region 04/08/2014   Depression, major 02/25/2013   Healthcare maintenance 12/16/2012   Tobacco abuse 10/08/2012   Severe obesity (BMI >= 40) ([place]) 10/08/2012   Asthma, chronic 03/09/2010   OSA (obstructive sleep apnea) 01/01/2007   Essential hypertension 11/23/2006    Past Surgical History:  Procedure Laterality Date   DILATION AND CURETTAGE OF UTERUS     ECTOPIC PREGNANCY SURGERY     LAPAROTOMY     for tubal pregnancy   TUBAL LIGATION      OB History   No obstetric history on file.      Home Medications    Prior to Admission medications   Medication Sig Start Date End Date Taking? Authorizing Provider  fluticasone (FLONASE) 50 MCG/ACT nasal spray Place 2 sprays into both nostrils daily. 07/30/21 08/29/21 Yes Lynden Oxford Scales, PA-C  Blood Glucose Monitoring Suppl (ONETOUCH VERIO) w/Device KIT PLEASE CHECK BLOOD SUGAR 3 TIMES DAILY 09/04/18   Ina Homes, MD  cetirizine (ZYRTEC) 10 MG chewable tablet Chew 1 tablet (10 mg total) by mouth at bedtime. 07/30/21 08/29/21  Lynden Oxford Scales, PA-C  cyclobenzaprine (FLEXERIL) 5 MG tablet TAKE 1 TO 2 TABLETS BY MOUTH EVERY DAY AT BEDTIME 08/04/20   Agyei, Caprice Kluver, MD  DULoxetine (CYMBALTA) 60 MG capsule Take 1 capsule (60 mg total)  by mouth daily. 08/20/20 11/18/20  Harvie Heck, MD  famotidine (PEPCID) 20 MG tablet Take 1 tablet (20 mg total) by mouth 2 (two) times daily. 12/31/18   Ina Homes, MD  glucose blood test strip Please check blood sugars 3 times daily 12/31/18   Ina Homes, MD  ibuprofen (ADVIL) 800 MG tablet Take 1 tablet (800 mg total) by mouth every 8 (eight) hours as needed for moderate pain. 06/07/20   Mosetta Anis, MD  Lancets [facility] ULTRASOFT) lancets Please check blood sugars 3 times daily 12/31/18   Ina Homes, MD  lisinopril-hydrochlorothiazide (ZESTORETIC) 20-25 MG  tablet Take 1 tablet by mouth daily. 08/20/20   Harvie Heck, MD  metFORMIN (GLUCOPHAGE-XR) 500 MG 24 hr tablet Take 2 tablets by mouth twice daily 03/28/21   Jose Persia, MD  pantoprazole (PROTONIX) 20 MG tablet Take 1 tablet (20 mg total) by mouth daily. 03/30/17   Rivet, Sindy Guadeloupe, MD  Semaglutide,0.25 or 0.5MG/DOS, 2 MG/1.5ML SOPN Inject 0.375 mLs (0.5 mg total) into the skin once a week. 05/26/20   Harvie Heck, MD  SUMAtriptan (IMITREX) 100 MG tablet Take 1 tablet (100 mg total) by mouth once as needed for migraine. May repeat in 2 hours if headache persists or recurs. 12/31/18   Ina Homes, MD    Family History Family History  Problem Relation Age of Onset   Stroke Father        in his 63's   Heart disease Father        died <47 y.o   Diabetes Mother    Hypertension Mother    Arthritis Mother    Hyperlipidemia Mother    Diabetes Maternal Aunt    Cancer Maternal Aunt        breast    Breast cancer Maternal Aunt 60   Heart disease Maternal Grandmother    Asthma Maternal Aunt     Social History Social History   Tobacco Use   Smoking status: Former    Packs/day: 0.03    Years: 30.00    Pack years: 0.90    Types: Cigarettes    Quit date: 05/21/2015    Years since quitting: 6.1   Smokeless tobacco: Never  Substance Use Topics   Alcohol use: Not Currently    Alcohol/week: 0.0 standard drinks    Comment: rarely.   Drug use: No     Allergies   Patient has no known allergies.   Review of Systems Review of Systems  Respiratory:  Negative for shortness of breath.   Cardiovascular:  Negative for chest pain.  Gastrointestinal:  Negative for abdominal pain.  Neurological:  Negative for headaches.  All other systems reviewed and are negative.   Physical Exam Triage Vital Signs ED Triage Vitals  Enc Vitals Group     BP      Pulse      Resp      Temp      Temp src      SpO2      Weight      Height      Head Circumference      Peak Flow      Pain Score       Pain Loc      Pain Edu?      Excl. in [place]?    No data found.  Updated Vital Signs BP (!) 172/113   Pulse 85   Temp 98.4 F (36.9 C)   Resp 20   LMP 03/20/2014  SpO2 93%   Visual Acuity Right Eye Distance:   Left Eye Distance:   Bilateral Distance:    Right Eye Near:   Left Eye Near:    Bilateral Near:     Physical Exam Constitutional:      Appearance: She is well-developed.  HENT:     Head: Normocephalic and atraumatic.     Right Ear: Tympanic membrane and ear canal normal.     Left Ear: Tympanic membrane and ear canal normal.     Mouth/Throat:     Mouth: Mucous membranes are moist.     Pharynx: Oropharynx is clear. No pharyngeal swelling or posterior oropharyngeal erythema.     Tonsils: No tonsillar exudate or tonsillar abscesses. 0 on the right. 0 on the left.  Eyes:     Conjunctiva/sclera: Conjunctivae normal.  Neck:     Thyroid: No thyromegaly.  Cardiovascular:     Rate and Rhythm: Normal rate and regular rhythm.     Heart sounds: Normal heart sounds.  Pulmonary:     Effort: Pulmonary effort is normal.     Breath sounds: Normal breath sounds.  Abdominal:     General: Bowel sounds are normal.     Palpations: Abdomen is soft.  Musculoskeletal:     Cervical back: Normal range of motion and neck supple.  Lymphadenopathy:     Cervical: No cervical adenopathy.  Skin:    General: Skin is warm and dry.  Neurological:     General: No focal deficit present.     Mental Status: She is alert and oriented to person, place, and time.  Psychiatric:        Mood and Affect: Mood normal.        Behavior: Behavior normal.     UC Treatments / Results  Labs (all labs ordered are listed, but only abnormal results are displayed) Labs Reviewed  SARS CORONAVIRUS 2 (TAT 6-24 HRS)    EKG   Radiology No results found.  Procedures Procedures (including critical care time)  Medications Ordered in UC Medications - No data to display  Initial Impression /  Assessment and Plan / UC Course  I have reviewed the triage vital signs and the nursing notes.  Pertinent labs & imaging results that were available during my care of the patient were reviewed by me and considered in my medical decision making (see chart for details).     Allergic rhinitis.  I have renewed patient's prescriptions for Zyrtec and Flonase.  Patient advised to take both for the next 2 weeks and, if her symptoms have improved, she can continue Flonase and discontinue the Zyrtec.  Patient verbalized understanding and agreed with plan.  All questions were addressed during visit. Final Clinical Impressions(s) / UC Diagnoses   Final diagnoses:  Allergic rhinitis, unspecified seasonality, unspecified trigger     Discharge Instructions      Please resume Flonase and Zyrtec.  Once your symptoms improve, you can discontinue Zyrtec and continue Flonase for ongoing control of your allergic rhinitis.     ED Prescriptions     Medication Sig Dispense Auth. Provider   cetirizine (ZYRTEC) 10 MG chewable tablet Chew 1 tablet (10 mg total) by mouth at bedtime. 30 tablet Lynden Oxford Scales, PA-C   fluticasone (FLONASE) 50 MCG/ACT nasal spray Place 2 sprays into both nostrils daily. 18.2 mL Lynden Oxford Scales, PA-C      PDMP not reviewed this encounter.   Lynden Oxford Scales, PA-C 07/30/21 1317

## 2021-09-05 ENCOUNTER — Other Ambulatory Visit: Payer: Self-pay | Admitting: Internal Medicine

## 2021-09-05 DIAGNOSIS — E119 Type 2 diabetes mellitus without complications: Secondary | ICD-10-CM

## 2021-09-17 ENCOUNTER — Other Ambulatory Visit: Payer: Self-pay | Admitting: Internal Medicine

## 2021-09-17 DIAGNOSIS — E119 Type 2 diabetes mellitus without complications: Secondary | ICD-10-CM

## 2021-10-14 ENCOUNTER — Other Ambulatory Visit: Payer: Self-pay

## 2021-10-14 ENCOUNTER — Emergency Department (HOSPITAL_BASED_OUTPATIENT_CLINIC_OR_DEPARTMENT_OTHER)
Admission: EM | Admit: 2021-10-14 | Discharge: 2021-10-14 | Disposition: A | Discharge status: Home / Self Care | Payer: 59 | Attending: Emergency Medicine | Admitting: Emergency Medicine

## 2021-10-14 ENCOUNTER — Encounter (HOSPITAL_BASED_OUTPATIENT_CLINIC_OR_DEPARTMENT_OTHER): Payer: Self-pay | Admitting: *Deleted

## 2021-10-14 ENCOUNTER — Emergency Department (HOSPITAL_BASED_OUTPATIENT_CLINIC_OR_DEPARTMENT_OTHER): Payer: 59

## 2021-10-14 DIAGNOSIS — Z79899 Other long term (current) drug therapy: Secondary | ICD-10-CM | POA: Diagnosis not present

## 2021-10-14 DIAGNOSIS — Z87891 Personal history of nicotine dependence: Secondary | ICD-10-CM | POA: Insufficient documentation

## 2021-10-14 DIAGNOSIS — H53149 Visual discomfort, unspecified: Secondary | ICD-10-CM | POA: Insufficient documentation

## 2021-10-14 DIAGNOSIS — R519 Headache, unspecified: Secondary | ICD-10-CM | POA: Insufficient documentation

## 2021-10-14 DIAGNOSIS — Z7984 Long term (current) use of oral hypoglycemic drugs: Secondary | ICD-10-CM | POA: Insufficient documentation

## 2021-10-14 DIAGNOSIS — E119 Type 2 diabetes mellitus without complications: Secondary | ICD-10-CM | POA: Diagnosis not present

## 2021-10-14 DIAGNOSIS — J45909 Unspecified asthma, uncomplicated: Secondary | ICD-10-CM | POA: Insufficient documentation

## 2021-10-14 DIAGNOSIS — I10 Essential (primary) hypertension: Secondary | ICD-10-CM | POA: Insufficient documentation

## 2021-10-14 DIAGNOSIS — Z7951 Long term (current) use of inhaled steroids: Secondary | ICD-10-CM | POA: Insufficient documentation

## 2021-10-14 MED ORDER — METOCLOPRAMIDE HCL 5 MG/ML IJ SOLN
10.0000 mg | Freq: Once | INTRAMUSCULAR | Status: AC
  Administered 2021-10-14: 10 mg via INTRAMUSCULAR
  Filled 2021-10-14: qty 2

## 2021-10-14 MED ORDER — DIPHENHYDRAMINE HCL 25 MG PO CAPS
25.0000 mg | ORAL_CAPSULE | Freq: Once | ORAL | Status: AC
  Administered 2021-10-14: 25 mg via ORAL
  Filled 2021-10-14: qty 1

## 2021-10-14 NOTE — ED Triage Notes (Signed)
Pt states she has had a headache for 2 weeks, nothing seems to relieve the pain, occ dizziness and occ seeing a dark spots, pt treating as sinus type headache as recommended by PCP

## 2021-10-14 NOTE — Discharge Instructions (Signed)
Please read and follow all provided instructions.  Your diagnoses today include:  1. Acute nonintractable headache, unspecified headache type     Tests performed today include: CT of your head which was normal and did not show any serious cause of your headache Vital signs. See below for your results today.   Medications:  In the Emergency Department you received: Reglan - antinausea/headache medication Benadryl - antihistamine to counteract potential side effects of reglan  Take any prescribed medications only as directed.  Additional information:  Follow any educational materials contained in this packet.  You are having a headache. No specific cause was found today for your headache. It may have been a migraine or other cause of headache. Stress, anxiety, fatigue, and depression are common triggers for headaches.   Your headache today does not appear to be life-threatening or require hospitalization, but often the exact cause of headaches is not determined in the emergency department. Therefore, follow-up with your doctor is very important to find out what may have caused your headache and whether or not you need any further diagnostic testing or treatment.   Sometimes headaches can appear benign (not harmful), but then more serious symptoms can develop which should prompt an immediate re-evaluation by your doctor or the emergency department.  BE VERY CAREFUL not to take multiple medicines containing Tylenol (also called acetaminophen). Doing so can lead to an overdose which can damage your liver and cause liver failure and possibly death.   Follow-up instructions: Please follow-up with your primary care provider in the next 3 days for further evaluation of your symptoms.   Return instructions:  Please return to the Emergency Department if you experience worsening symptoms. Return if the medications do not resolve your headache, if it recurs, or if you have multiple episodes of  vomiting or cannot keep down fluids. Return if you have a change from the usual headache. RETURN IMMEDIATELY IF you: Develop a sudden, severe headache Develop confusion or become poorly responsive or faint Develop a fever above 100.60F or problem breathing Have a change in speech, vision, swallowing, or understanding Develop new weakness, numbness, tingling, incoordination in your arms or legs Have a seizure Please return if you have any other emergent concerns.  Additional Information:  Your vital signs today were: BP (!) 140/114   Pulse 85   Temp 98.4 F (36.9 C)   Resp 16   Ht 5\' 1"  (1.549 m)   Wt 97.1 kg   LMP 03/20/2014   SpO2 100%   BMI 40.43 kg/m  If your blood pressure (BP) was elevated above 135/85 this visit, please have this repeated by your doctor within one month. --------------

## 2021-10-14 NOTE — ED Provider Notes (Signed)
[facility] EMERGENCY DEPT Provider Note   CSN: [id number] Arrival date & time: 10/14/21  1321     History Chief Complaint  Patient presents with   Headache    Crystal English is a 55 y.o. female.  Patient with history of migraines when she was younger, diabetes presents the emergency department for evaluation of headache.  She has had a headache for the past 2 to 3 weeks.  It is frontal in nature, sharp at times.  It is more tight over the parietal areas.  She has associated photophobia.  Denies head injuries.  She has taken ibuprofen, sinus medication without improvement.  No fevers or URI symptoms.  No dental pain. Patient denies signs of stroke including: facial droop, slurred speech, aphasia, weakness/numbness in extremities, imbalance/trouble walking.  There have been times, not currently, that she has had a dark spot in her vision.  No severe neck pain or manipulations. The onset of this condition was acute. The course is waxing and waning, never fully resolves.  Aggravating factors: Light. Alleviating factors: none.        Past Medical History:  Diagnosis Date   Allergic rhinitis    Anxiety    Asthma    Required inhaled corticosteroid in past.    Benign paroxysmal positional vertigo, unspecified laterality    Body mass index (BMI) 40.0-44.9, adult (HCC)    Cervical radiculopathy    Depression, major 02/25/2013   First episode that required was 2014. Prior episodes that did not require pt to seek medical tx.   DM (diabetes mellitus) ([place])    Fibromyalgia    Hyperlipidemia    Hypertension    2 drug therapy   Low back pain 08/10/2016   Morbid obesity due to excess calories (HCC)    Nausea 09/19/2018   OSA (obstructive sleep apnea)    Sleep study 12/2006 : Moderately Severe OSA. AHI 38.8. O2 sat decreased to 71%.   Primary insomnia    Severe obesity (BMI >= 40) (HCC) 10/08/2012   Obesity Class 3. BMI > 40.    Strain of flexor muscle of right hip  09/19/2018   Tobacco abuse    Urge incontinence     Patient Active Problem List   Diagnosis Date Noted   BPPV (benign paroxysmal positional vertigo) 08/11/2020   AKI (acute kidney injury) ([place]) 08/11/2020   Left knee pain 01/15/2020   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   Adjustment disorder with anxious mood 11/04/2019   Anxiety 04/03/2019   Urge incontinence 10/12/2018   Dyspepsia 09/19/2018   Fibromyalgia 04/14/2018   Allergic rhinitis 05/04/2017   Migraine 08/10/2016   Diabetes mellitus ([place]) 02/14/2016   Perimenopause 12/23/2014   Chronic urticaria 04/08/2014   Cervical disc disorder with radiculopathy of cervical region 04/08/2014   Depression, major 02/25/2013   Healthcare maintenance 12/16/2012   Tobacco abuse 10/08/2012   Severe obesity (BMI >= 40) ([place]) 10/08/2012   Asthma, chronic 03/09/2010   OSA (obstructive sleep apnea) 01/01/2007   Essential hypertension 11/23/2006    Past Surgical History:  Procedure Laterality Date   DILATION AND CURETTAGE OF UTERUS     ECTOPIC PREGNANCY SURGERY     LAPAROTOMY     for tubal pregnancy   TUBAL LIGATION       OB History   No obstetric history on file.     Family History  Problem Relation Age of Onset   Stroke Father        in his 73's   Heart disease  Father        died <57 y.o   Diabetes Mother    Hypertension Mother    Arthritis Mother    Hyperlipidemia Mother    Diabetes Maternal Aunt    Cancer Maternal Aunt        breast    Breast cancer Maternal Aunt 4   Heart disease Maternal Grandmother    Asthma Maternal Aunt     Social History   Tobacco Use   Smoking status: Former    Packs/day: 0.03    Years: 30.00    Pack years: 0.90    Types: Cigarettes    Quit date: 05/21/2015    Years since quitting: 6.4   Smokeless tobacco: Never  Vaping Use   Vaping Use: Never used  Substance Use Topics   Alcohol use: Not Currently    Alcohol/week: 0.0 standard drinks    Comment: rarely.   Drug use: No    Home  Medications Prior to Admission medications   Medication Sig Start Date End Date Taking? Authorizing Provider  lisinopril-hydrochlorothiazide (ZESTORETIC) 20-25 MG tablet Take 1 tablet by mouth daily. 08/20/20  Yes Aslam, Loralyn Freshwater, MD  metFORMIN (GLUCOPHAGE-XR) 500 MG 24 hr tablet Take 2 tablets by mouth twice daily 03/28/21  Yes Jose Persia, MD  Blood Glucose Monitoring Suppl (ONETOUCH VERIO) w/Device KIT PLEASE CHECK BLOOD SUGAR 3 TIMES DAILY 09/04/18   Ina Homes, MD  cetirizine (ZYRTEC) 10 MG chewable tablet Chew 1 tablet (10 mg total) by mouth at bedtime. 07/30/21 08/29/21  Lynden Oxford Scales, PA-C  cyclobenzaprine (FLEXERIL) 5 MG tablet TAKE 1 TO 2 TABLETS BY MOUTH EVERY DAY AT BEDTIME 08/04/20   Agyei, Caprice Kluver, MD  DULoxetine (CYMBALTA) 60 MG capsule Take 1 capsule (60 mg total) by mouth daily. 08/20/20 11/18/20  Harvie Heck, MD  famotidine (PEPCID) 20 MG tablet Take 1 tablet (20 mg total) by mouth 2 (two) times daily. 12/31/18   Ina Homes, MD  fluticasone (FLONASE) 50 MCG/ACT nasal spray Place 2 sprays into both nostrils daily. 07/30/21 08/29/21  Lynden Oxford Scales, PA-C  glucose blood test strip Please check blood sugars 3 times daily 12/31/18   Ina Homes, MD  ibuprofen (ADVIL) 800 MG tablet Take 1 tablet (800 mg total) by mouth every 8 (eight) hours as needed for moderate pain. 06/07/20   Mosetta Anis, MD  Lancets [facility] ULTRASOFT) lancets Please check blood sugars 3 times daily 12/31/18   Ina Homes, MD  pantoprazole (PROTONIX) 20 MG tablet Take 1 tablet (20 mg total) by mouth daily. 03/30/17   Rivet, Sindy Guadeloupe, MD  Semaglutide,0.25 or 0.5MG/DOS, 2 MG/1.5ML SOPN Inject 0.375 mLs (0.5 mg total) into the skin once a week. 05/26/20   Harvie Heck, MD  SUMAtriptan (IMITREX) 100 MG tablet Take 1 tablet (100 mg total) by mouth once as needed for migraine. May repeat in 2 hours if headache persists or recurs. 12/31/18   Ina Homes, MD    Allergies    Patient has no known  allergies.  Review of Systems   Review of Systems  Constitutional:  Negative for fever.  HENT:  Negative for congestion, dental problem, rhinorrhea and sinus pressure.   Eyes:  Negative for discharge, redness and visual disturbance.  Respiratory:  Negative for shortness of breath.   Cardiovascular:  Negative for chest pain.  Gastrointestinal:  Negative for nausea and vomiting.  Musculoskeletal:  Negative for gait problem, neck pain and neck stiffness.  Skin:  Negative for rash.  Neurological:  Positive for headaches. Negative for syncope, speech difficulty, weakness, light-headedness and numbness.  Psychiatric/Behavioral:  Negative for confusion.    Physical Exam Updated Vital Signs BP (!) 129/93 (BP Location: Right Arm)   Pulse (!) 102   Temp 98.4 F (36.9 C)   Resp 16   Ht '5\' 1"'  (1.549 m)   Wt 97.1 kg   LMP 03/20/2014   SpO2 99%   BMI 40.43 kg/m   Physical Exam Vitals and nursing note reviewed.  Constitutional:      Appearance: She is well-developed.  HENT:     Head: Normocephalic and atraumatic.     Comments: No tenderness to palpation over the temporal areas bilaterally.    Right Ear: Tympanic membrane, ear canal and external ear normal.     Left Ear: Tympanic membrane, ear canal and external ear normal.     Nose: Nose normal.     Mouth/Throat:     Pharynx: Uvula midline.  Eyes:     General: Lids are normal.     Extraocular Movements:     Right eye: No nystagmus.     Left eye: No nystagmus.     Conjunctiva/sclera: Conjunctivae normal.     Pupils: Pupils are equal, round, and reactive to light.     Right eye: Pupil is round and reactive.     Left eye: Pupil is round and reactive.  Cardiovascular:     Rate and Rhythm: Normal rate and regular rhythm.  Pulmonary:     Effort: Pulmonary effort is normal.     Breath sounds: Normal breath sounds.  Abdominal:     Palpations: Abdomen is soft.     Tenderness: There is no abdominal tenderness.  Musculoskeletal:      Cervical back: Normal range of motion and neck supple. No tenderness or bony tenderness.  Skin:    General: Skin is warm and dry.  Neurological:     Mental Status: She is alert and oriented to person, place, and time.     GCS: GCS eye subscore is 4. GCS verbal subscore is 5. GCS motor subscore is 6.     Cranial Nerves: No cranial nerve deficit.     Sensory: No sensory deficit.     Motor: No weakness.     Coordination: Coordination normal.     Comments: Upper extremity myotomes tested bilaterally:  C5 Shoulder abduction 5/5 C6 Elbow flexion/wrist extension 5/5 C7 Elbow extension 5/5 C8 Finger flexion 5/5 T1 Finger abduction 5/5      ED Results / Procedures / Treatments   Labs (all labs ordered are listed, but only abnormal results are displayed) Labs Reviewed - No data to display  EKG None  Radiology CT Head Wo Contrast  Result Date: 10/14/2021 CLINICAL DATA:  Headache EXAM: CT HEAD WITHOUT CONTRAST TECHNIQUE: Contiguous axial images were obtained from the base of the skull through the vertex without intravenous contrast. COMPARISON:  None. FINDINGS: Brain: No evidence of acute infarction, hemorrhage, hydrocephalus, extra-axial collection or mass lesion/mass effect. Vascular: No hyperdense vessel or unexpected calcification. Skull: Normal. Negative for fracture or focal lesion. Sinuses/Orbits: No acute finding. Other: None. IMPRESSION: No acute intracranial abnormality. Electronically Signed   By: Yetta Glassman M.D.   On: 10/14/2021 15:14    Procedures Procedures   Medications Ordered in ED Medications  metoCLOPramide (REGLAN) injection 10 mg (10 mg Intramuscular Given 10/14/21 1808)  diphenhydrAMINE (BENADRYL) capsule 25 mg (25 mg Oral Given 10/14/21 1807)    ED Course  I have  reviewed the triage vital signs and the nursing notes.  Pertinent labs & imaging results that were available during my care of the patient were reviewed by me and considered in my medical  decision making (see chart for details).  Patient seen and examined.  CT ordered at triage reviewed.  This is reassuring.  We will treat with Reglan and Benadryl.  No temporal tenderness and I do not feel that the patient requires further work-up with lab test including ESR.  No current vision changes.  Feel that she would benefit from outpatient neurology/ophthalmology follow-up.  She is in agreement.  She states that she will call her daughter for a ride home.  Vital signs reviewed and are as follows: BP (!) 129/93 (BP Location: Right Arm)   Pulse (!) 102   Temp 98.4 F (36.9 C)   Resp 16   Ht '5\' 1"'  (1.549 m)   Wt 97.1 kg   LMP 03/20/2014   SpO2 99%   BMI 40.43 kg/m   6:28 PM patient up to restroom without any difficulty.  Plan for discharge as above.  Referrals made.  Patient counseled to return if they have weakness in their arms or legs, slurred speech, trouble walking or talking, confusion, trouble with their balance, or if they have any other concerns. Patient verbalizes understanding and agrees with plan.     MDM Rules/Calculators/A&P                           Patient with frontal headache, waxing and waning over the past several weeks.  This is different than her previous migraines.  CT today was negative without any abnormal findings.  Patient without high-risk features of headache including: sudden onset/thunderclap HA, altered mental status, accompanying seizure, headache with exertion, history of immunocompromise, neck or shoulder pain, fever, use of anticoagulation, family history of spontaneous SAH, concomitant drug use, toxic exposure.   Patient has a normal complete neurological exam, normal vital signs, normal level of consciousness, no signs of meningismus, is well-appearing/non-toxic appearing, no signs of trauma, no pain over the temporal arteries and have low concern for temporal arteritis today.  Patient does report occasional spots in her vision as well as  photophobia.  She would likely benefit from PCP/neurology follow-up as well as ophthalmology especially as she has a history of diabetes.  Low concern for carotid or vertebral dissection at this time.  Doubt increased ICP, but would benefit from neurology follow-up.  No indication for LP today.  No dangerous or life-threatening conditions suspected or identified by history, physical exam, and by work-up. No indications for hospitalization identified.   Final Clinical Impression(s) / ED Diagnoses Final diagnoses:  Acute nonintractable headache, unspecified headache type    Rx / DC Orders ED Discharge Orders          Ordered    Ambulatory referral to Neurology       Comments: An appointment is requested in approximately: 1 week for new worsening HA   10/14/21 1820             Carlisle Cater, PA-C 10/14/21 1831    [place], Alvin Critchley, DO 10/14/21 2359

## 2021-10-17 ENCOUNTER — Encounter: Payer: Self-pay | Admitting: Neurology

## 2021-10-20 ENCOUNTER — Encounter: Payer: Self-pay | Admitting: Cardiology

## 2021-10-20 ENCOUNTER — Encounter: Payer: Self-pay | Admitting: Radiology

## 2021-10-20 ENCOUNTER — Other Ambulatory Visit: Payer: Self-pay | Admitting: Cardiology

## 2021-10-20 ENCOUNTER — Other Ambulatory Visit: Payer: Self-pay

## 2021-10-20 ENCOUNTER — Ambulatory Visit: Payer: 59 | Admitting: Cardiology

## 2021-10-20 VITALS — BP 144/90 | HR 86 | Ht 61.0 in | Wt 216.0 lb

## 2021-10-20 DIAGNOSIS — R002 Palpitations: Secondary | ICD-10-CM

## 2021-10-20 DIAGNOSIS — R9431 Abnormal electrocardiogram [ECG] [EKG]: Secondary | ICD-10-CM | POA: Diagnosis not present

## 2021-10-20 DIAGNOSIS — Z8249 Family history of ischemic heart disease and other diseases of the circulatory system: Secondary | ICD-10-CM

## 2021-10-20 DIAGNOSIS — I10 Essential (primary) hypertension: Secondary | ICD-10-CM | POA: Diagnosis not present

## 2021-10-20 NOTE — Addendum Note (Signed)
Addended by: Jacinta Shoe on: 10/20/2021 02:08 PM   Modules accepted: Orders

## 2021-10-20 NOTE — Progress Notes (Signed)
Cardiology CONSULT Note    Date:  10/20/2021   ID:  Crystal English, DOB [birth year], MRN [id number]  PCP:  [name], PA  Cardiologist:  Crystal Him, MD   No chief complaint on file.   History of Present Illness:  Crystal English is a 55 y.o. female who is being seen today for the evaluation of palpitations at the request of [place], [place], [place].  This is a 55 year old African-American female with a history of asthma, anxiety, diabetes mellitus, hypertension, hyperlipidemia, fibromyalgia who was referred for evaluation of palpitations.  She tells me that the palpitations only occurred 2-3 times recently but has not had any further episodes.  She has a strong fm hx of heart disease.  He had a CVA in late 76's and then died of a massive MI in his early 26's.  Her maternal GF died of an MI late age in life.  She has a strong fm hx of CVAs in the past as well.  She tells me that the palpitations lasted about 5-10 min each time and resolved on their own.  She says that she had been stressed at the time.  There was no associated dizziness or syncope.  She denies any chest pain or pressure, SOB, PND, orthopnea, LE edema.  She used to smoke but quit in 2014.  Past Medical History:  Diagnosis Date   Allergic rhinitis    Anxiety    Asthma    Required inhaled corticosteroid in past.    Benign paroxysmal positional vertigo, unspecified laterality    Body mass index (BMI) 40.0-44.9, adult (HCC)    Cervical radiculopathy    Depression, major 02/25/2013   First episode that required was 2014. Prior episodes that did not require pt to seek medical tx.   DM (diabetes mellitus) ([place])    Fibromyalgia    Hyperlipidemia    Hypertension    2 drug therapy   Low back pain 08/10/2016   Morbid obesity due to excess calories (HCC)    Nausea 09/19/2018   OSA (obstructive sleep apnea)    Sleep study 12/2006 : Moderately Severe OSA. AHI 38.8. O2 sat decreased to 71%.   Primary insomnia     Severe obesity (BMI >= 40) (HCC) 10/08/2012   Obesity Class 3. BMI > 40.    Strain of flexor muscle of right hip 09/19/2018   Tobacco abuse    Urge incontinence     Past Surgical History:  Procedure Laterality Date   DILATION AND CURETTAGE OF UTERUS     ECTOPIC PREGNANCY SURGERY     LAPAROTOMY     for tubal pregnancy   TUBAL LIGATION      Current Medications: Current Meds  Medication Sig   Blood Glucose Monitoring Suppl (ONETOUCH VERIO) w/Device KIT PLEASE CHECK BLOOD SUGAR 3 TIMES DAILY   glucose blood test strip Please check blood sugars 3 times daily   Lancets (ONETOUCH ULTRASOFT) lancets Please check blood sugars 3 times daily   lisinopril-hydrochlorothiazide (ZESTORETIC) 20-25 MG tablet Take 1 tablet by mouth daily.   metFORMIN (GLUCOPHAGE-XR) 500 MG 24 hr tablet Take 2 tablets by mouth twice daily    Allergies:   Patient has no known allergies.   Social History   Socioeconomic History   Marital status: Single    Spouse name: Not on file   Number of children: 3   Years of education: some coll.   Highest education level: Not on file  Occupational History  Occupation: Clinical cytogeneticist: [employer]  Tobacco Use   Smoking status: Former    Packs/day: 0.03    Years: 30.00    Pack years: 0.90    Types: Cigarettes    Quit date: 05/21/2015    Years since quitting: 6.4   Smokeless tobacco: Never  Vaping Use   Vaping Use: Never used  Substance and Sexual Activity   Alcohol use: Not Currently    Alcohol/week: 0.0 standard drinks    Comment: rarely.   Drug use: No   Sexual activity: Not on file  Other Topics Concern   Not on file  Social History Narrative   Lives in [place] alone. 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   Social Determinants of Health   Financial Resource Strain: Not on file  Food Insecurity: Not on file  Transportation Needs: Not on file  Physical Activity: Not on file  Stress: Not on file  Social Connections:  Not on file     Family History:  The patient's family history includes Arthritis in her mother; Asthma in her maternal aunt; Breast cancer (age of onset: 80) in her maternal aunt; Cancer in her maternal aunt; Diabetes in her maternal aunt and mother; Heart disease in her father and maternal grandmother; Hyperlipidemia in her mother; Hypertension in her mother; Stroke in her father.   ROS:   Please see the history of present illness.    ROS All other systems reviewed and are negative.  No flowsheet data found.     PHYSICAL EXAM:   VS:  BP (!) 144/90   Pulse 86   Ht 5' 1"  (1.549 m)   Wt 216 lb (98 kg)   LMP 03/20/2014   SpO2 99%   BMI 40.81 kg/m    GEN: Well nourished, well developed, in no acute distress  HEENT: normal  Neck: no JVD, carotid bruits, or masses Cardiac: RRR; no murmurs, rubs, or gallops,no edema.  Intact distal pulses bilaterally.  Respiratory:  clear to auscultation bilaterally, normal work of breathing GI: soft, nontender, nondistended, + BS MS: no deformity or atrophy  Skin: warm and dry, no rash Neuro:  Alert and Oriented x 3, Strength and sensation are intact Psych: euthymic mood, full affect  Wt Readings from Last 3 Encounters:  10/20/21 216 lb (98 kg)  10/14/21 214 lb (97.1 kg)  08/10/20 217 lb 8 oz (98.7 kg)      Studies/Labs Reviewed:   EKG:  EKG is ordered today.  The ekg ordered today demonstrates NSR with septal infarct  Recent Labs: No results found for requested labs within last 8760 hours.   Lipid Panel    Component Value Date/Time   CHOL 163 02/27/2014 1650   TRIG 84 02/27/2014 1650   HDL 44 02/27/2014 1650   CHOLHDL 3.7 02/27/2014 1650   VLDL 17 02/27/2014 1650   LDLCALC 102 (H) 02/27/2014 1650    Additional studies/ records that were reviewed today include:  OV notes from PCP    ASSESSMENT:    1. Palpitations   2. Essential hypertension   3. Family history of premature coronary artery disease      PLAN:  In order  of problems listed above:  Palpitations -these have only occurred 3 times and were very short lived -she admits to drinking 2 cups of coffee in the am but no ETOH -recent TSH was normal at 1.49 -I will get a 30 day event monitor to assess further  2.  Hypertension -BP is borderline controlled on exam today. -Continue prescription drug management with lisinopril HCT 20-25 mg daily with as needed refills  3.  Family hx of premature CAD -her father had an MI in his early 74's -I will get a coronary Ca score to assess future cardiac risk -EKG showed septal infarct but likely lead placement -check 2D echo to assess LVF  Time Spent: 25 minutes total time of encounter, including 20 minutes spent in face-to-face patient care on the date of this encounter. This time includes coordination of care and counseling regarding above mentioned problem list. Remainder of non-face-to-face time involved reviewing chart documents/testing relevant to the patient encounter and documentation in the medical record. I have independently reviewed documentation from referring provider  Medication Adjustments/Labs and Tests Ordered: Current medicines are reviewed at length with the patient today.  Concerns regarding medicines are outlined above.  Medication changes, Labs and Tests ordered today are listed in the Patient Instructions below.  There are no Patient Instructions on file for this visit.   Signed, Crystal Him, MD  10/20/2021 1:58 PM    [facility] [address], [place], [place]  [address] Phone: [phone]; Fax: [phone]

## 2021-10-20 NOTE — Addendum Note (Signed)
Addended by: Nuala Alpha on: 10/20/2021 02:35 PM   Modules accepted: Orders

## 2021-10-20 NOTE — Progress Notes (Signed)
Enrolled patient for a 30 day [employer] Event monitor to be mailed to patients home.  

## 2021-10-20 NOTE — Patient Instructions (Signed)
Medication Instructions:  Your physician recommends that you continue on your current medications as directed. Please refer to the Current Medication list given to you today.  *If you need a refill on your cardiac medications before your next appointment, please call your pharmacy*   Lab Work: NONE If you have labs (blood work) drawn today and your tests are completely normal, you will receive your results only by: [place] (if you have MyChart) OR A paper copy in the mail If you have any lab test that is abnormal or we need to change your treatment, we will call you to review the results.   Testing/Procedures: Your physician has requested that you have an echocardiogram. Echocardiography is a painless test that uses sound waves to create images of your heart. It provides your doctor with information about the size and shape of your heart and how well your heart's chambers and valves are working. This procedure takes approximately one hour. There are no restrictions for this procedure.   Your physician has recommended that you wear an 30 DAY event monitor. Event monitors are medical devices that record the heart's electrical activity. Doctors most often [place] these monitors to diagnose arrhythmias. Arrhythmias are problems with the speed or rhythm of the heartbeat. The monitor is a small, portable device. You can wear one while you do your normal daily activities. This is usually used to diagnose what is causing palpitations/syncope (passing out).  YOUR PHYSICIAN RECOMMENDS THAT YOU HAVE A CALCIUM SCORE DONE.    Follow-Up: At [facility], you and your health needs are our priority.  As part of our continuing mission to provide you with exceptional heart care, we have created designated Provider Care Teams.  These Care Teams include your primary Cardiologist (physician) and Advanced Practice Providers (APPs -  Physician Assistants and Nurse Practitioners) who all work together to provide  you with the care you need, when you need it.  We recommend signing up for the patient portal called "MyChart".  Sign up information is provided on this After Visit Summary.  MyChart is used to connect with patients for Virtual Visits (Telemedicine).  Patients are able to view lab/test results, encounter notes, upcoming appointments, etc.  Non-urgent messages can be sent to your provider as well.   To learn more about what you can do with MyChart, go to [email or web address].    Your next appointment:   Your physician recommends that you schedule a follow-up appointment AS NEEDED

## 2021-10-31 ENCOUNTER — Ambulatory Visit (INDEPENDENT_AMBULATORY_CARE_PROVIDER_SITE_OTHER): Payer: 59

## 2021-10-31 DIAGNOSIS — R002 Palpitations: Secondary | ICD-10-CM

## 2021-11-23 ENCOUNTER — Other Ambulatory Visit: Payer: Self-pay | Admitting: *Deleted

## 2021-11-23 ENCOUNTER — Encounter: Payer: Self-pay | Admitting: *Deleted

## 2021-11-23 NOTE — Progress Notes (Signed)
Referring:  Crystal English, [place],  [place] [address]  PCP: Sabra Heck, [place], [place]  Neurology was asked to evaluate Crystal English, a 56 year old female for a chief complaint of headaches.  Our recommendations of care will be communicated by shared medical record.    CC:  headaches  HPI:  Medical co-morbidities: asthma, depression, DM, fibromyalgia, HLD, HTN, OSA, cervical stenosis with radiculopathy  The patient presents for evaluation of headaches which began 2.5 months ago. She has a history of migraines, but had been migraine free for several years before this. Headaches were constant for 1.5 months and started to improve in frequency and severity after that. Currently she has headaches 3-4 times per week. Headaches are described as a holocephalic throbbing pain with associated photophobia abd phonophobia. They can last all day. She also has superimposed stabbing pains in her right ear and temple which can radiate to her cheek and jaw.   She has known cervical radiculopathy which causes the fingers in her RUE to go numb. Last cervical MRI was in 2015.  She also reports pain which shoots down her right leg. Denies weakness or paresthesias.  Headache History: Onset: 2.5 months Triggers: lying down in bed, getting hair braided, bending forward Aura: no Location: holocephalic, right ear Quality/Description: stabbing, pounding Associated Symptoms:  Photophobia: yes  Phonophobia: yes  Nausea: no Worse with activity?: yes Duration of headaches: throbbing pain lasts all day, stabbing pain lasts seconds  Headache days per month: 12 Headache free days per month: 18  Current Treatment: Abortive none  Preventative none  Prior Therapies                                 Cymbalta 60 mg daily Gabapentin - side effects Lyrica Amitriptyline 25 mg QHS Tramadol Flexeril Ibuprofen Tylenol Imitrex - lack of efficacy  Headache Risk  Factors: Headache risk factors and/or co-morbidities (+) Neck Pain (+) Back Pain (+) Fibromyalgia (+) Obesity  Body mass index is 39.49 kg/m. (-) History of Traumatic Brain Injury and/or Concussion  LABS: CBC    Component Value Date/Time   WBC 7.1 02/22/2017 1843   RBC 4.56 02/22/2017 1843   HGB 13.1 02/22/2017 1843   HCT 40.2 02/22/2017 1843   PLT 300 02/22/2017 1843   MCV 88.2 02/22/2017 1843   MCH 28.7 02/22/2017 1843   MCHC 32.6 02/22/2017 1843   RDW 14.5 02/22/2017 1843   LYMPHSABS 2.4 06/23/2014 1120   MONOABS 0.4 06/23/2014 1120   EOSABS 0.1 06/23/2014 1120   BASOSABS 0.0 06/23/2014 1120   CMP Latest Ref Rng & Units 08/10/2020 04/05/2020 01/15/2020  Glucose 65 - 99 mg/dL 143(H) 111(H) -  BUN 6 - 24 mg/dL 14 11 -  Creatinine 0.57 - 1.00 mg/dL 1.34(H) 0.82 -  Sodium 134 - 144 mmol/L 142 140 -  Potassium 3.5 - 5.2 mmol/L 4.4 4.3 -  Chloride 96 - 106 mmol/L 99 99 -  CO2 20 - 29 mmol/L 23 27 -  Calcium 8.7 - 10.2 mg/dL 9.7 9.8 -  Total Protein 6.0 - 8.5 g/dL - - 7.2  Total Bilirubin 0.3 - 1.2 mg/dL - - -  Alkaline Phos 39 - 117 U/L - - -  AST 0 - 37 U/L - - -  ALT 0 - 35 U/L - - -     IMAGING:  [facility] 10/14/21: unremarkable  MRI C-spine 2015:  1. Mild central canal narrowing at C5-6 without significant foraminal stenosis. 2. Mild to moderate right central and right greater than left foraminal stenosis at C6-7. 3. Shallow central disc protrusion at C4-5 without significant stenosis.  Imaging independently reviewed on November 24, 2021   Current Outpatient Medications on File Prior to Visit  Medication Sig Dispense Refill   atorvastatin (LIPITOR) 10 MG tablet atorvastatin 10 mg tablet  TAKE 1 TABLET BY MOUTH ONCE DAILY     Blood Glucose Monitoring Suppl (ONETOUCH VERIO) w/Device KIT PLEASE CHECK BLOOD SUGAR 3 TIMES DAILY 1 kit 0   glucose blood test strip Please check blood sugars 3 times daily 100 each 12   hydrOXYzine (ATARAX) 25 MG tablet hydroxyzine HCl 25 mg  tablet  TAKE 1 TABLET BY MOUTH ONCE DAILY AT BEDTIME AS NEEDED     Lancets (ONETOUCH ULTRASOFT) lancets Please check blood sugars 3 times daily 100 each 12   lisinopril-hydrochlorothiazide (ZESTORETIC) 20-25 MG tablet Take 1 tablet by mouth daily. 90 tablet 2   metFORMIN (GLUCOPHAGE-XR) 500 MG 24 hr tablet Take 2 tablets by mouth twice daily 120 tablet 0   pregabalin (LYRICA) 50 MG capsule pregabalin 50 mg capsule  TAKE 1 CAPSULE BY MOUTH ONCE DAILY IN THE EVENING FOR 1 WEEK THEN INCREASE TO 1 CAPSULE TWICE DAILY     Semaglutide,0.25 or 0.5MG/DOS, 2 MG/1.5ML SOPN Inject 0.375 mLs (0.5 mg total) into the skin once a week. 4.5 mL 3   SUMAtriptan (IMITREX) 25 MG tablet sumatriptan 25 mg tablet  TAKE ONE TABLET BY MOUTH AT LEAST 2 HOURS BETWEEN DOSES AS NEEDED TWICE DAILY     No current facility-administered medications on file prior to visit.     Allergies: No Known Allergies  Family History: Migraine or other headaches in the family:  mom Aneurysms in a first degree relative:  no Brain tumors in the family:  no Other neurological illness in the family:   no  Past Medical History: Past Medical History:  Diagnosis Date   Allergic rhinitis    Anxiety    Asthma    Required inhaled corticosteroid in past.    Benign paroxysmal positional vertigo, unspecified laterality    Body mass index (BMI) 40.0-44.9, adult (HCC)    Cervical radiculopathy    Daily headache    Depression, major 02/25/2013   First episode that required was 2014. Prior episodes that did not require pt to seek medical tx.   DM (diabetes mellitus) ([place])    Fibromyalgia    Hyperlipidemia    Hypertension    2 drug therapy   Low back pain 08/10/2016   Morbid obesity due to excess calories (HCC)    Nausea 09/19/2018   OSA (obstructive sleep apnea)    Sleep study 12/2006 : Moderately Severe OSA. AHI 38.8. O2 sat decreased to 71%.   Primary insomnia    Severe obesity (BMI >= 40) (HCC) 10/08/2012   Obesity Class 3. BMI  > 40.    Strain of flexor muscle of right hip 09/19/2018   Tobacco abuse    Urge incontinence     Past Surgical History Past Surgical History:  Procedure Laterality Date   DILATION AND CURETTAGE OF UTERUS     ECTOPIC PREGNANCY SURGERY     LAPAROTOMY     for tubal pregnancy   TUBAL LIGATION      Social History: Social History   Tobacco Use   Smoking status: Former    Packs/day: 0.03    Years:  30.00    Pack years: 0.90    Types: Cigarettes    Quit date: 05/21/2015    Years since quitting: 6.5   Smokeless tobacco: Never  Vaping Use   Vaping Use: Never used  Substance Use Topics   Alcohol use: Not Currently    Alcohol/week: 0.0 standard drinks    Comment: rarely.   Drug use: No    ROS: Negative for fevers, chills. Positive for headaches, neck pain, leg pain. All other systems reviewed and negative unless stated otherwise in HPI.   Physical Exam:   Vital Signs: BP (!) 132/99    Pulse 93    Ht 5' 1"  (1.549 m)    Wt 209 lb (94.8 kg)    LMP 03/20/2014    BMI 39.49 kg/m  GENERAL: well appearing,in no acute distress,alert SKIN:  Color, texture, turgor normal. No rashes or lesions HEAD:  Normocephalic/atraumatic. CV:  RRR RESP: Normal respiratory effort MSK: no tenderness to palpation over occiput, neck, or shoulders  NEUROLOGICAL: Mental Status: Alert, oriented to person, place and time,Follows commands Cranial Nerves: PERRL,visual fields intact to confrontation,extraocular movements intact,facial sensation intact,no facial droop or ptosis,hearing intact to finger rub bilaterally,no dysarthria Motor: muscle strength 5/5 both upper and lower extremities,no drift, normal tone Reflexes: 2+ throughout Sensation: intact to light touch all 4 extremities other than diminished sensation over right fingertips  Coordination: Finger-to- nose-finger intact bilaterally,Heel-to-shin intact bilaterally Gait: normal-based   IMPRESSION: 56 year old female with a history of  asthma,  depression, DM, fibromyalgia, HLD, HTN, OSA, cervical stenosis with radiculopathy who presents for evaluation of headaches which began 2.5 months ago. Her more prolonged headaches with photophobia are consistent with migraines. Suspect she does have a cervicogenic component as well given her known cervical stenosis and radiculopathy. Will get updated MRI C-spine to assess severity of her stenosis. Will also check MRI L-spine as she is now reporting lumbar radicular symptoms as well. Referral to neck PT placed to help with her cervicalgia. Discussed treatment options including medications and Botox. She will look over her options and reach out to me when she decides what she would like to do.  PLAN: -MRI C-spine, L-spine -Referral to neck PT -Discussed nortriptyline vs Botox for headache prevention, she will reach out to the office once she decides -next steps: consider occipital nerve block   I spent a total of 45 minutes chart reviewing and counseling the patient. Headache education was done. Discussed treatment options including preventive and acute medications, natural supplements, and physical therapy. Discussed medication side effects, adverse reactions and drug interactions. Written educational materials and patient instructions outlining all of the above were given.  Follow-up: 3 months   Genia Harold, MD 11/24/2021   2:03 PM

## 2021-11-24 ENCOUNTER — Ambulatory Visit (HOSPITAL_COMMUNITY): Payer: 59

## 2021-11-24 ENCOUNTER — Ambulatory Visit: Admission: RE | Admit: 2021-11-24 | Payer: Self-pay | Source: Ambulatory Visit

## 2021-11-24 ENCOUNTER — Encounter: Payer: Self-pay | Admitting: Psychiatry

## 2021-11-24 ENCOUNTER — Ambulatory Visit (INDEPENDENT_AMBULATORY_CARE_PROVIDER_SITE_OTHER): Payer: 59 | Admitting: Psychiatry

## 2021-11-24 VITALS — BP 132/99 | HR 93 | Ht 61.0 in | Wt 209.0 lb

## 2021-11-24 DIAGNOSIS — M5412 Radiculopathy, cervical region: Secondary | ICD-10-CM | POA: Diagnosis not present

## 2021-11-24 DIAGNOSIS — G43019 Migraine without aura, intractable, without status migrainosus: Secondary | ICD-10-CM | POA: Diagnosis not present

## 2021-11-24 DIAGNOSIS — M542 Cervicalgia: Secondary | ICD-10-CM

## 2021-11-24 DIAGNOSIS — M5416 Radiculopathy, lumbar region: Secondary | ICD-10-CM

## 2021-11-24 NOTE — Patient Instructions (Addendum)
MRI of the cervical and lumbar spine Referral to physical therapy for the neck Let me know if you would like me to send in a prescription for nortriptyline or you would like to start Botox

## 2021-11-27 ENCOUNTER — Emergency Department (HOSPITAL_COMMUNITY)
Admission: EM | Admit: 2021-11-27 | Discharge: 2021-11-27 | Disposition: A | Discharge status: Home / Self Care | Payer: 59 | Attending: Emergency Medicine | Admitting: Emergency Medicine

## 2021-11-27 ENCOUNTER — Encounter (HOSPITAL_COMMUNITY): Payer: Self-pay | Admitting: Emergency Medicine

## 2021-11-27 ENCOUNTER — Ambulatory Visit (HOSPITAL_COMMUNITY): Admission: EM | Admit: 2021-11-27 | Discharge: 2021-11-27 | Payer: 59 | Attending: Urgent Care | Admitting: Urgent Care

## 2021-11-27 ENCOUNTER — Emergency Department (HOSPITAL_COMMUNITY): Payer: 59

## 2021-11-27 ENCOUNTER — Other Ambulatory Visit: Payer: Self-pay

## 2021-11-27 DIAGNOSIS — R32 Unspecified urinary incontinence: Secondary | ICD-10-CM

## 2021-11-27 DIAGNOSIS — E119 Type 2 diabetes mellitus without complications: Secondary | ICD-10-CM | POA: Diagnosis not present

## 2021-11-27 DIAGNOSIS — I10 Essential (primary) hypertension: Secondary | ICD-10-CM | POA: Diagnosis not present

## 2021-11-27 DIAGNOSIS — R531 Weakness: Secondary | ICD-10-CM

## 2021-11-27 DIAGNOSIS — M4310 Spondylolisthesis, site unspecified: Secondary | ICD-10-CM

## 2021-11-27 DIAGNOSIS — Z7984 Long term (current) use of oral hypoglycemic drugs: Secondary | ICD-10-CM | POA: Insufficient documentation

## 2021-11-27 DIAGNOSIS — Z79899 Other long term (current) drug therapy: Secondary | ICD-10-CM | POA: Insufficient documentation

## 2021-11-27 DIAGNOSIS — M4316 Spondylolisthesis, lumbar region: Secondary | ICD-10-CM | POA: Diagnosis not present

## 2021-11-27 DIAGNOSIS — M545 Low back pain, unspecified: Secondary | ICD-10-CM

## 2021-11-27 DIAGNOSIS — J45909 Unspecified asthma, uncomplicated: Secondary | ICD-10-CM | POA: Insufficient documentation

## 2021-11-27 LAB — BASIC METABOLIC PANEL
Anion gap: 16 — ABNORMAL HIGH (ref 5–15)
BUN: 8 mg/dL (ref 6–20)
CO2: 29 mmol/L (ref 22–32)
Calcium: 10 mg/dL (ref 8.9–10.3)
Chloride: 95 mmol/L — ABNORMAL LOW (ref 98–111)
Creatinine, Ser: 0.92 mg/dL (ref 0.44–1.00)
GFR, Estimated: >60 mL/min (ref >60)
Glucose, Bld: 312 mg/dL — ABNORMAL HIGH (ref 70–99)
Potassium: 4 mmol/L (ref 3.5–5.1)
Sodium: 140 mmol/L (ref 135–145)

## 2021-11-27 LAB — CBC WITH DIFFERENTIAL/PLATELET
Abs Immature Granulocytes: 0.01 10*3/uL (ref 0.00–0.07)
Basophils Absolute: 0 10*3/uL (ref 0.0–0.1)
Basophils Relative: 1 %
Eosinophils Absolute: 0.1 10*3/uL (ref 0.0–0.5)
Eosinophils Relative: 2 %
HCT: 45 % (ref 36.0–46.0)
Hemoglobin: 13.7 g/dL (ref 12.0–15.0)
Immature Granulocytes: 0 %
Lymphocytes Relative: 43 %
Lymphs Abs: 2.7 10*3/uL (ref 0.7–4.0)
MCH: 28.6 pg (ref 26.0–34.0)
MCHC: 30.4 g/dL (ref 30.0–36.0)
MCV: 93.9 fL (ref 80.0–100.0)
Monocytes Absolute: 0.6 10*3/uL (ref 0.1–1.0)
Monocytes Relative: 9 %
Neutro Abs: 2.9 10*3/uL (ref 1.7–7.7)
Neutrophils Relative %: 45 %
Platelets: 279 10*3/uL (ref 150–400)
RBC: 4.79 MIL/uL (ref 3.87–5.11)
RDW: 13.3 % (ref 11.5–15.5)
WBC: 6.2 10*3/uL (ref 4.0–10.5)
nRBC: 0 % (ref 0.0–0.2)

## 2021-11-27 MED ORDER — HYDROCODONE-ACETAMINOPHEN 5-325 MG PO TABS
1.0000 | ORAL_TABLET | Freq: Once | ORAL | Status: AC
  Administered 2021-11-27: 1 via ORAL

## 2021-11-27 MED ORDER — METHOCARBAMOL 500 MG PO TABS: 500.0000 mg | ORAL_TABLET | Freq: Two times a day (BID) | ORAL | 0 refills | Status: DC

## 2021-11-27 MED ORDER — HYDROCODONE-ACETAMINOPHEN 5-325 MG PO TABS: 1.0000 | ORAL_TABLET | Freq: Four times a day (QID) | ORAL | 0 refills | Status: AC | PRN

## 2021-11-27 MED ORDER — MORPHINE SULFATE (PF) 4 MG/ML IV SOLN
4.0000 mg | Freq: Once | INTRAVENOUS | Status: AC
  Administered 2021-11-27: 4 mg via INTRAVENOUS
  Filled 2021-11-27: qty 1

## 2021-11-27 MED ORDER — HYDROCODONE-ACETAMINOPHEN 5-325 MG PO TABS
ORAL_TABLET | ORAL | Status: AC
  Filled 2021-11-27: qty 1

## 2021-11-27 MED ORDER — HYDROMORPHONE HCL 1 MG/ML IJ SOLN: 1.0000 mg | Freq: Once | INTRAMUSCULAR | Status: DC

## 2021-11-27 MED ORDER — LIDOCAINE 5 % EX PTCH: 1.0000 | MEDICATED_PATCH | CUTANEOUS | 0 refills | Status: DC

## 2021-11-27 NOTE — ED Triage Notes (Signed)
Pt reports that since last week having lower back pain that radiates down right leg.  Pt reports had a Neurologist appt last Thursday and waiting on MRI to be scheduled.

## 2021-11-27 NOTE — Discharge Instructions (Addendum)
Please schedule an appointment with the neurosurgery provider attached to these discharge papers.  Also you should get in touch with your primary care provider about your results today.  I have sent lidocaine patches, Robaxin ( a muscle relaxant) and 3 days worth of Vicodin to your pharmacy.  Please use these as prescribed and discussed.

## 2021-11-27 NOTE — ED Provider Notes (Signed)
[facility]   MRN: [id number] DOB: 12-Apr-1966  Subjective:   Crystal English is a 56 y.o. female presenting for 1 week history of acute onset recurrent low back pain that has become severe.  Symptoms were worse yesterday, she had fecal incontinence yesterday.  She is also had significant change in her strength of her lower legs and feels significant difficulty walking.  She has had a history of spinal injury from a severe fall.  She has been working with the neurologist who was supposed to get an MRI but has not yet been scheduled.  She has been told that she has damaged L4-L5 discs.  Unfortunately she does not know the specifics of this.  Denies any recent fall or trauma.  No current facility-administered medications for this encounter.  Current Outpatient Medications:    atorvastatin (LIPITOR) 10 MG tablet, atorvastatin 10 mg tablet  TAKE 1 TABLET BY MOUTH ONCE DAILY, Disp: , Rfl:    Blood Glucose Monitoring Suppl (ONETOUCH VERIO) w/Device KIT, PLEASE CHECK BLOOD SUGAR 3 TIMES DAILY, Disp: 1 kit, Rfl: 0   glucose blood test strip, Please check blood sugars 3 times daily, Disp: 100 each, Rfl: 12   hydrOXYzine (ATARAX) 25 MG tablet, hydroxyzine HCl 25 mg tablet  TAKE 1 TABLET BY MOUTH ONCE DAILY AT BEDTIME AS NEEDED, Disp: , Rfl:    Lancets (ONETOUCH ULTRASOFT) lancets, Please check blood sugars 3 times daily, Disp: 100 each, Rfl: 12   lisinopril-hydrochlorothiazide (ZESTORETIC) 20-25 MG tablet, Take 1 tablet by mouth daily., Disp: 90 tablet, Rfl: 2   metFORMIN (GLUCOPHAGE-XR) 500 MG 24 hr tablet, Take 2 tablets by mouth twice daily, Disp: 120 tablet, Rfl: 0   pregabalin (LYRICA) 50 MG capsule, pregabalin 50 mg capsule  TAKE 1 CAPSULE BY MOUTH ONCE DAILY IN THE EVENING FOR 1 WEEK THEN INCREASE TO 1 CAPSULE TWICE DAILY, Disp: , Rfl:    Semaglutide,0.25 or 0.5MG/DOS, 2 MG/1.5ML SOPN, Inject 0.375 mLs (0.5 mg total) into the skin once a week., Disp: 4.5 mL, Rfl: 3    SUMAtriptan (IMITREX) 25 MG tablet, sumatriptan 25 mg tablet  TAKE ONE TABLET BY MOUTH AT LEAST 2 HOURS BETWEEN DOSES AS NEEDED TWICE DAILY, Disp: , Rfl:    No Known Allergies  Past Medical History:  Diagnosis Date   Allergic rhinitis    Anxiety    Asthma    Required inhaled corticosteroid in past.    Benign paroxysmal positional vertigo, unspecified laterality    Body mass index (BMI) 40.0-44.9, adult (HCC)    Cervical radiculopathy    Daily headache    Depression, major 02/25/2013   First episode that required was 2014. Prior episodes that did not require pt to seek medical tx.   DM (diabetes mellitus) ([place])    Fibromyalgia    Hyperlipidemia    Hypertension    2 drug therapy   Low back pain 08/10/2016   Morbid obesity due to excess calories (HCC)    Nausea 09/19/2018   OSA (obstructive sleep apnea)    Sleep study 12/2006 : Moderately Severe OSA. AHI 38.8. O2 sat decreased to 71%.   Primary insomnia    Severe obesity (BMI >= 40) (HCC) 10/08/2012   Obesity Class 3. BMI > 40.    Strain of flexor muscle of right hip 09/19/2018   Tobacco abuse    Urge incontinence      Past Surgical History:  Procedure Laterality Date   DILATION AND CURETTAGE OF UTERUS  ECTOPIC PREGNANCY SURGERY     LAPAROTOMY     for tubal pregnancy   TUBAL LIGATION      Family History  Problem Relation Age of Onset   Diabetes Mother    Hypertension Mother    Arthritis Mother    Hyperlipidemia Mother    Hypertension Father    Stroke Father        in his 66's   Heart disease Father        died <47 y.o   Heart disease Sister    Heart disease Brother    Heart disease Maternal Grandmother    Diabetes Maternal Aunt    Cancer Maternal Aunt        breast    Breast cancer Maternal Aunt 60   Asthma Maternal Aunt     Social History   Tobacco Use   Smoking status: Former    Packs/day: 0.03    Years: 30.00    Pack years: 0.90    Types: Cigarettes    Quit date: 05/21/2015    Years since  quitting: 6.5   Smokeless tobacco: Never  Vaping Use   Vaping Use: Never used  Substance Use Topics   Alcohol use: Not Currently    Alcohol/week: 0.0 standard drinks    Comment: rarely.   Drug use: No    ROS   Objective:   Vitals: BP (!) 132/95 (BP Location: Left Arm)    Pulse 79    Temp 98.9 F (37.2 C) (Oral)    Resp 19    LMP 03/20/2014    SpO2 95%   Physical Exam Constitutional:      General: She is not in acute distress.    Appearance: Normal appearance. She is well-developed. She is not ill-appearing, toxic-appearing or diaphoretic.  HENT:     Head: Normocephalic and atraumatic.     Nose: Nose normal.     Mouth/Throat:     Mouth: Mucous membranes are moist.     Pharynx: Oropharynx is clear.  Eyes:     General: No scleral icterus.    Extraocular Movements: Extraocular movements intact.     Pupils: Pupils are equal, round, and reactive to light.  Cardiovascular:     Rate and Rhythm: Normal rate.  Pulmonary:     Effort: Pulmonary effort is normal.  Musculoskeletal:     Lumbar back: Spasms and tenderness present. No swelling, edema, deformity, signs of trauma, lacerations or bony tenderness. Decreased range of motion. No scoliosis.  Skin:    General: Skin is warm and dry.  Neurological:     General: No focal deficit present.     Mental Status: She is alert and oriented to person, place, and time.     Motor: Weakness (Lower legs bilaterally) present.     Coordination: Coordination abnormal (Has to ambulate in a wheelchair which is new for the patient).  Psychiatric:        Mood and Affect: Mood normal.        Behavior: Behavior normal.        Thought Content: Thought content normal.        Judgment: Judgment normal.    Assessment and Plan :   PDMP not reviewed this encounter.  1. Severe low back pain   2. Weakness   3. Urinary incontinence, unspecified type    Patient has severe back pain with concerning neurologic symptoms of fecal incontinence,  substantial weakness of her lower legs.  Recommended further evaluation in the  emergency room including consideration for an MRI.  She was given hydrocodone for her back pain in clinic.  Contracts for safety and will present to the emergency room with her family member taking her there by personal vehicle.   Jaynee Eagles, [place] 11/27/21 1206

## 2021-11-27 NOTE — ED Notes (Signed)
Patient is being discharged from the Urgent Care and sent to the Emergency Department via personal vehicle with family  . Per Provider Jaynee Eagles, patient is in need of higher level of care due to severe back pain & urinary incontinence. Patient is aware and verbalizes understanding of plan of care.  Vitals:   11/27/21 1104  BP: (!) 132/95  Pulse: 79  Resp: 19  Temp: 98.9 F (37.2 C)  SpO2: 95%

## 2021-11-27 NOTE — ED Provider Triage Note (Signed)
Emergency Medicine Provider Triage Evaluation Note  Crystal English , a 56 y.o. female  was evaluated in triage.  Pt complains of persistent lower back pain for a week that radiates down her right leg.  She said she was previously diagnosed with a pinched nerve in her cervical spine, and today she was sent for an MRI by the neurologist.  She had 1 episode of incontinence yesterday.  Denies weakness.  No fevers, chills.  No IV drug use.  Review of Systems  Positive: Neck pain, back pain, numbness, incontinence Negative: Weakness, trauma  Physical Exam  LMP 03/20/2014  Gen:   Awake, no distress   Resp:  Normal effort  MSK:   Moves extremities without difficulty  Other:  Decreased hip and knee extension on the right side due to pain, sensation intact compared to her prior  Medical Decision Making  Medically screening exam initiated at 12:02 PM.  Appropriate orders placed.  ARMANDINA IMAN was informed that the remainder of the evaluation will be completed by another provider, this initial triage assessment does not replace that evaluation, and the importance of remaining in the ED until their evaluation is complete.     Lynnex Fulp T, PA-C 11/27/21 1210

## 2021-11-27 NOTE — ED Provider Notes (Signed)
[facility] EMERGENCY DEPARTMENT Provider Note   CSN: [id number] Arrival date & time: 11/27/21  1155     History  Chief Complaint  Patient presents with   Back Pain    Crystal English is a 56 y.o. female with a past medical history of hypertension, hyperlipidemia, diabetes, asthma and fibromyalgia presenting today for lower back pain.  Patient was seen in urgent care for this earlier today and after reporting that she had had 1 episode of fecal incontinence yesterday and difficulty walking and she was sent to the emergency department.  She reports that for the past week she has had worsening bilateral lower back pain, worse on the right side.  She was seen by neurology on Thursday who scheduled her for a outpatient cervical, thoracic and lumbar MRIs.  She never received a call to schedule and continues to be in pain and barely able to ambulate.  Denies any difficulty urinating or urinary incontinence.  No saddle anesthesia.  No IVDU or history of malignancy.  Denies any fevers or chills    Home Medications Prior to Admission medications   Medication Sig Start Date End Date Taking? Authorizing Provider  atorvastatin (LIPITOR) 10 MG tablet atorvastatin 10 mg tablet  TAKE 1 TABLET BY MOUTH ONCE DAILY    [provider]  Blood Glucose Monitoring Suppl (ONETOUCH VERIO) w/Device KIT PLEASE CHECK BLOOD SUGAR 3 TIMES DAILY 09/04/18   Helberg, Larkin Ina, MD  glucose blood test strip Please check blood sugars 3 times daily 12/31/18   Ina Homes, MD  hydrOXYzine (ATARAX) 25 MG tablet hydroxyzine HCl 25 mg tablet  TAKE 1 TABLET BY MOUTH ONCE DAILY AT BEDTIME AS NEEDED    [provider]  Lancets (ONETOUCH ULTRASOFT) lancets Please check blood sugars 3 times daily 12/31/18   Ina Homes, MD  lisinopril-hydrochlorothiazide (ZESTORETIC) 20-25 MG tablet Take 1 tablet by mouth daily. 08/20/20   Harvie Heck, MD  metFORMIN (GLUCOPHAGE-XR) 500 MG 24 hr tablet  Take 2 tablets by mouth twice daily 03/28/21   Jose Persia, MD  pregabalin (LYRICA) 50 MG capsule pregabalin 50 mg capsule  TAKE 1 CAPSULE BY MOUTH ONCE DAILY IN THE EVENING FOR 1 WEEK THEN INCREASE TO 1 CAPSULE TWICE DAILY    [provider]  Semaglutide,0.25 or 0.5MG/DOS, 2 MG/1.5ML SOPN Inject 0.375 mLs (0.5 mg total) into the skin once a week. 05/26/20   Harvie Heck, MD  SUMAtriptan (IMITREX) 25 MG tablet sumatriptan 25 mg tablet  TAKE ONE TABLET BY MOUTH AT LEAST 2 HOURS BETWEEN DOSES AS NEEDED TWICE DAILY    [provider]      Allergies    Patient has no known allergies.    Review of Systems   Review of Systems  Musculoskeletal:  Positive for back pain.   Physical Exam Updated Vital Signs BP (!) 127/97 (BP Location: Left Arm)    Pulse 89    Temp 98.5 F (36.9 C)    Resp 14    LMP 03/20/2014    SpO2 98%  Physical Exam Vitals and nursing note reviewed.  Constitutional:      General: She is not in acute distress.    Appearance: Normal appearance. She is not ill-appearing.  HENT:     Head: Normocephalic and atraumatic.  Eyes:     General: No scleral icterus.    Conjunctiva/sclera: Conjunctivae normal.  Pulmonary:     Effort: Pulmonary effort is normal. No respiratory distress.  Musculoskeletal:  General: Tenderness present. No swelling, deformity or signs of injury. Normal range of motion.     Cervical back: Normal range of motion. No tenderness.     Right lower leg: No edema.     Left lower leg: No edema.     Comments: Patient with severe midline tenderness at the level of L4-L5. L4-S1.  Normal active range of motion of left lower extremity, active range of motion limited in all directions on right lower extremity.  Passive range of motion intact.  No sensation deficit bilaterally.  Patient with extreme difficulty and pain with ambulation.  Localizes the pain to the right paraspinal muscles and down her buttocks.  Skin:    General: Skin is warm  and dry.     Capillary Refill: Capillary refill takes less than 2 seconds. 2+ DP pulses bilaterally    Findings: No rash.  Neurological:     Mental Status: She is alert.     Motor: No weakness.     Gait: Gait normal.     Comments: 5 out of 5 strength with knee flexion, extension and plantar and dorsiflexion bilaterally.  Psychiatric:        Mood and Affect: Mood normal.    ED Results / Procedures / Treatments   Labs (all labs ordered are listed, but only abnormal results are displayed) Labs Reviewed - No data to display  EKG None  Radiology No results found.  Procedures  Medications Ordered in ED Medications  morphine 4 MG/ML injection 4 mg (4 mg Intravenous Given 11/27/21 1636)    ED Course/ Medical Decision Making/ A&P                           Medical Decision Making  56 year old female presenting today with back pain.  Urgent care center due to some red flags including an episode of fecal incontinence and difficulty ambulating.  She denies IV drug use, fevers or chills.  Low suspicion for epidural abscess.  She was supposed to get MRI as outpatient however she was unable to tolerate the pain and presented to urgent care.  Outpatient MRI orders were completed today.  MRI negative for spinal cord involvement, however do show degenerative changes and anterior lithiasis in the L5-S1 area of her spine.  We discussed these findings and I have referred her to neurosurgery.  She will be discharged with lidocaine patches, Robaxin and 3 days worth of Vicodin.  Final Clinical Impression(s) / ED Diagnoses Final diagnoses:  Anterolisthesis    Rx / DC Orders Results and diagnoses were explained to the patient. Return precautions discussed in full. Patient had no additional questions and expressed complete understanding.   This chart was dictated using voice recognition software.  Despite best efforts to proofread,  errors can occur which can change the documentation meaning.     Rhae Hammock, PA-C 11/27/21 2230    Drenda Freeze, MD 11/27/21 [phone]

## 2021-11-27 NOTE — ED Triage Notes (Signed)
Pt reports intermittent back pain for a week that radiates down her right leg. Sent for MRI. Denies incontinence or weakness.

## 2021-11-27 NOTE — Discharge Instructions (Signed)
Please report to the emergency room now.  I am concerned that you have a severe spinal problem given your pain, weakness, difficulty standing or walking, incontinence.  I do not believe that you should wait to try and get the MRI.  Unfortunately we are not able to do that in our clinic and therefore you require a higher level of care than we can provide in the urgent care setting.  Please go to the hospital now.

## 2021-11-28 ENCOUNTER — Encounter: Payer: Self-pay | Admitting: Psychiatry

## 2021-11-28 ENCOUNTER — Other Ambulatory Visit: Payer: Self-pay | Admitting: Psychiatry

## 2021-11-28 NOTE — Telephone Encounter (Signed)
Noted, thanks!

## 2021-12-01 ENCOUNTER — Ambulatory Visit (INDEPENDENT_AMBULATORY_CARE_PROVIDER_SITE_OTHER)
Admission: RE | Admit: 2021-12-01 | Discharge: 2021-12-01 | Disposition: A | Discharge status: Home / Self Care | Payer: Self-pay | Source: Ambulatory Visit | Attending: Cardiology | Admitting: Cardiology

## 2021-12-01 ENCOUNTER — Encounter: Payer: Self-pay | Admitting: Cardiology

## 2021-12-01 ENCOUNTER — Ambulatory Visit (HOSPITAL_COMMUNITY): Payer: 59 | Attending: Cardiology

## 2021-12-01 ENCOUNTER — Other Ambulatory Visit: Payer: Self-pay

## 2021-12-01 DIAGNOSIS — R9431 Abnormal electrocardiogram [ECG] [EKG]: Secondary | ICD-10-CM

## 2021-12-01 DIAGNOSIS — R931 Abnormal findings on diagnostic imaging of heart and coronary circulation: Secondary | ICD-10-CM | POA: Insufficient documentation

## 2021-12-01 DIAGNOSIS — Z8249 Family history of ischemic heart disease and other diseases of the circulatory system: Secondary | ICD-10-CM

## 2021-12-01 LAB — ECHOCARDIOGRAM COMPLETE
Area-P 1/2: 3.62 cm2
S' Lateral: 2.5 cm

## 2021-12-02 ENCOUNTER — Telehealth: Payer: Self-pay

## 2021-12-02 DIAGNOSIS — Z8249 Family history of ischemic heart disease and other diseases of the circulatory system: Secondary | ICD-10-CM

## 2021-12-02 NOTE — Telephone Encounter (Signed)
The patient has been notified of the result and verbalized understanding.  All questions (if any) were answered. Antonieta Iba, RN 12/02/2021 1:37 PM  Patient will call back to schedule lab work when she finds out when she can have someone bring her.

## 2021-12-02 NOTE — Telephone Encounter (Signed)
-----   Message from Sueanne Margarita, MD sent at 12/01/2021  9:03 PM EST ----- Coronary calcium score 6.3 which is 82nd% for age and sex matched controls.  Please have her come in for FLP and ALT as her LDL goal is < 70

## 2021-12-08 ENCOUNTER — Encounter: Payer: Self-pay | Admitting: Physical Therapy

## 2021-12-08 ENCOUNTER — Other Ambulatory Visit: Payer: Self-pay

## 2021-12-08 ENCOUNTER — Ambulatory Visit: Payer: 59 | Attending: Psychiatry | Admitting: Physical Therapy

## 2021-12-08 DIAGNOSIS — R262 Difficulty in walking, not elsewhere classified: Secondary | ICD-10-CM | POA: Diagnosis present

## 2021-12-08 DIAGNOSIS — G44229 Chronic tension-type headache, not intractable: Secondary | ICD-10-CM | POA: Diagnosis present

## 2021-12-08 DIAGNOSIS — M542 Cervicalgia: Secondary | ICD-10-CM | POA: Diagnosis present

## 2021-12-08 DIAGNOSIS — M545 Low back pain, unspecified: Secondary | ICD-10-CM | POA: Insufficient documentation

## 2021-12-09 NOTE — Therapy (Signed)
[facility] [address] [place], [place], [address] Phone: [phone]   Fax:  [phone]  Physical Therapy Evaluation  Patient Details  Name: Crystal English MRN: [id number] Date of Birth: 1966/02/06 Referring Provider (PT): Genia Harold, MD   Encounter Date: 12/08/2021   PT End of Session - 12/09/21 1819     Visit Number 1    Number of Visits 17    Date for PT Re-Evaluation 02/07/22    Authorization Type [employer]    PT Start Time [phone]    PT Stop Time 0805    PT Time Calculation (min) 49 min    Activity Tolerance Patient tolerated treatment well    Behavior During Therapy [facility] for tasks assessed/performed             Past Medical History:  Diagnosis Date   Agatston coronary artery calcium score less than 100    6.3 coronary Ca score 11/2021 CT   Allergic rhinitis    Anxiety    Asthma    Required inhaled corticosteroid in past.    Benign paroxysmal positional vertigo, unspecified laterality    Body mass index (BMI) 40.0-44.9, adult (HCC)    Cervical radiculopathy    Daily headache    Depression, major 02/25/2013   First episode that required was 2014. Prior episodes that did not require pt to seek medical tx.   DM (diabetes mellitus) ([place])    Fibromyalgia    Hyperlipidemia    Hypertension    2 drug therapy   Low back pain 08/10/2016   Morbid obesity due to excess calories (HCC)    Nausea 09/19/2018   OSA (obstructive sleep apnea)    Sleep study 12/2006 : Moderately Severe OSA. AHI 38.8. O2 sat decreased to 71%.   Primary insomnia    Severe obesity (BMI >= 40) (HCC) 10/08/2012   Obesity Class 3. BMI > 40.    Strain of flexor muscle of right hip 09/19/2018   Tobacco abuse    Urge incontinence     Past Surgical History:  Procedure Laterality Date   DILATION AND CURETTAGE OF UTERUS     ECTOPIC PREGNANCY SURGERY     LAPAROTOMY     for tubal pregnancy   TUBAL LIGATION       There were no vitals filed for this visit.    12/08/21 0712  Symptoms/Limitations  Subjective 3 main issues: headaches, neck pain, and severe low back pain.  Pt has a history of migraines but most recent headache felt different; very sudden, sharp pains, symptoms persisted for 2 months.  This HA began a week before Thanksgiving; was very sensitive to noise.  Would wake up with it and would go bed with it.  Went to ED and had imaging performed; no acute abnormalities found.  Referred to neurology who prescribed pt medication for migraines and did a sleep study.  HA finally resolved on it's own but still gets dull pain intermittently and one episode of sharp pain.  HA was whole R side of her head; ear pain.  Pt denied any drooping of eyes or mouth and no facial numbness or tingling.  Low back pain began when doing in depth cleaning - became progressively worse, went back to ED and had further imaging.  Back pain is 75% better but pain still fluctuates.  Back pain can radiate down R side and into buttocks and lateral R thigh.  Not feeling pain in sitting; feels it more  in standing.  Has limited standing tolerance.  Pertinent History Had a fall down stairs in 2015 - would have some back pain with prolonged walking;  Diagnostic tests MRI in ER: IMPRESSION:  1. No spinal cord or cauda equina compression.  2. Severe facet arthrosis at L5-S1 with grade 1 anterolisthesis.  3. Mild bilateral neural foraminal stenosis at C6-7.  4. No spinal canal or neural foraminal stenosis of the thoracic or lumbar spine.  Patient Stated Goals Hoping to be more mobile; allow life to go back to normal; go for walks with daughter and grandson  Pain Assessment  Currently in Pain? Yes  Pain Score 7 (no HA)  Pain Location Back  Pain Orientation Lower  Pain Descriptors / Indicators Radiating  Pain Type Acute pain     12/08/21 0740  Assessment  Medical Diagnosis Neck pain, HA, low back radiculopathy  Referring Provider  (PT) Genia Harold, MD  Onset Date/Surgical Date 11/24/21  Hand Dominance Right  Prior Therapy PT x 2  Precautions  Precautions Fall;Other (comment)  Precaution Comments Fall in 2015 down stairs; Anxiety, asthma, BPPV, cervical radiculopathy, daily HA, depression, DM, fibromyalgia, HLD, HTN, LBP, OSA, insomnia, tobacco abuse, urge incontinence  Balance Screen  Has the patient fallen in the past 6 months No  Has the patient had a decrease in activity level because of a fear of falling?  Yes  [place] Private residence  Living Arrangements Alone  Type of [place] Access Level entry  Arp None  Additional Comments Mother is going to let her borrow a cane.  Pt is not able to drive currently.  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  Leisure Walking for exercise; aerobics; is nervous about pools  Observation/Other Assessments  Focus on Therapeutic Outcomes (FOTO)  Not assessed  Sensation  Light Touch Impaired by gross assessment  Additional Comments intact to light touch but decreased to light touch R middle finger and dorsum of fingers  Posture/Postural Control  Posture/Postural Control Postural limitations  Postural Limitations Increased lumbar lordosis;Increased thoracic kyphosis;Anterior pelvic tilt  ROM / Strength  AROM / PROM / Strength Strength;AROM  AROM  AROM Assessment Site Lumbar  Overall AROM  Due to pain  Overall AROM Comments Neck ROM WNL and equal bilat: pt reported pain in CT junction, R side of neck and head with rotation to L.  Reports flare of HA.  Strength  Overall Strength Comments UE: 4+/5 but pain in R shoulder with resisted flexion; LE: 4+/5 and no pain in back with resistance  Palpation  Palpation comment Painful trigger points in R UT and levator mm     Objective measurements  completed on examination: See above findings.     PT Education - 12/09/21 1818     Education Details clinical findings, PT POC and goals, plan to assess low back next session    Person(s) Educated Patient    Methods Explanation    Comprehension Verbalized understanding             PT Short Term Goals - 12/11/21 1826       PT SHORT TERM GOAL #1   Title Pt will participate in assessment of lumbar spine ROM and LE ROM    Time 4    Period Weeks    Status New    Target Date 01/10/22  PT SHORT TERM GOAL #2   Title Pt will initiate HEP focusing on improving mobility of cervical and thoracic spine and lumbar spine/R hip    Time 4    Period Weeks    Status New    Target Date 01/10/22      PT SHORT TERM GOAL #3   Title Pt will initiate walking program    Time 4    Period Weeks    Status New    Target Date 01/10/22             PT Long Term Goals - 12/11/21 1828       PT LONG TERM GOAL #1   Title Pt will demonstrate independence with final HEP and will report return to walking in the park with family without increase in low back pain    Time 8    Period Weeks    Status New    Target Date 02/09/22      PT LONG TERM GOAL #2   Title Pt will demonstrate full neck ROM without increase in neck pain or HA    Time 8    Period Weeks    Status New    Target Date 02/09/22      PT LONG TERM GOAL #3   Title Lumbar/hip ROM goal as indicated    Time 8    Period Weeks    Status New    Target Date 02/09/22      PT LONG TERM GOAL #4   Title Pt will collaborate with PT regarding work station/computer set up and implement recommendations to improve working posture if indicated    Time 8    Period Weeks    Status New    Target Date 02/09/22      PT LONG TERM GOAL #5   Title Pt will demonstrate improved mechanics for bending and reaching out of BOS to decrease increased strain on low back when performing household chores    Time 8    Period Weeks    Status New     Target Date 02/09/22              Plan - 12/09/21 1819     Clinical Impression Statement Pt is a 56 year old female referred to Neuro OPPT for evaluation of headaches, neck pain and low back pain.  Pt's PMH is significant for the following: Anxiety, asthma, BPPV, cervical radiculopathy, daily HA, depression, DM, fibromyalgia, HLD, HTN, LBP, OSA, insomnia, tobacco abuse, urge incontinence. The following deficits were noted during pt's exam: neck pain with active range of motion, painful myofascial trigger points, chronic tension type HA, R sided low back pain with symptoms radiating into R buttocks and hip, impaired posture, impaired body mechanics when performing work and home tasks, and difficulty walking.  Pt would benefit from skilled PT to address these impairments and functional limitations to maximize functional mobility independence and reduce falls risk.    Personal Factors and Comorbidities Comorbidity 3+;Fitness;Past/Current Experience;Profession;Transportation    Comorbidities Anxiety, asthma, BPPV, cervical radiculopathy, daily HA, depression, DM, fibromyalgia, HLD, HTN, LBP, OSA, insomnia, tobacco abuse, urge incontinence    Examination-Activity Limitations Bend;Lift;Locomotion Level;Stand    Examination-Participation Restrictions Cleaning;Driving;Occupation;Community Activity    Stability/Clinical Decision Making Evolving/Moderate complexity    Clinical Decision Making Moderate    Rehab Potential Good    PT Frequency 2x / week    PT Duration 8 weeks    PT Treatment/Interventions ADLs/Self Care Home Management;Aquatic Therapy;Cryotherapy;Moist Heat;Functional mobility  training;Therapeutic activities;Therapeutic exercise;Balance training;Neuromuscular re-education;Manual techniques;Patient/family education;Passive range of motion;Dry needling;Taping    PT Next Visit Plan I'm sorry, there was a lot of history to cover; I ran out of time to look at R low back pain/lumbar ROM.  Initiate  manual therapy at Cervico-thoracic junction, soft tissue work at upper trap/levator on R, treatment for lumbar findings and initiate HEP for neck and low back.  Will need progressive walking program.  Will eventually need training for body mechanics when bending, reaching, etc.  Sits at a computer for work - any modifications needed for posture?    PT Home Exercise Plan Progressive walking program    Recommended Other Services aquatic?  Dry needling?    Consulted and Agree with Plan of Care Patient             Patient will benefit from skilled therapeutic intervention in order to improve the following deficits and impairments:  Decreased mobility, Decreased range of motion, Difficulty walking, Increased muscle spasms, Postural dysfunction, Pain  Visit Diagnosis: No diagnosis found.     Problem List Patient Active Problem List   Diagnosis Date Noted   Agatston coronary artery calcium score less than 100 12/01/2021   BPPV (benign paroxysmal positional vertigo) 08/11/2020   AKI (acute kidney injury) ([place]) 08/11/2020   Left knee pain 01/15/2020   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   Adjustment disorder with anxious mood 11/04/2019   Anxiety 04/03/2019   Urge incontinence 10/12/2018   Dyspepsia 09/19/2018   Fibromyalgia 04/14/2018   Allergic rhinitis 05/04/2017   Migraine 08/10/2016   Diabetes mellitus ([place]) 02/14/2016   Perimenopause 12/23/2014   Chronic urticaria 04/08/2014   Cervical disc disorder with radiculopathy of cervical region 04/08/2014   Depression, major 02/25/2013   Healthcare maintenance 12/16/2012   Tobacco abuse 10/08/2012   Severe obesity (BMI >= 40) (HCC) 10/08/2012   Asthma, chronic 03/09/2010   OSA (obstructive sleep apnea) 01/01/2007   Essential hypertension 11/23/2006    Rico Junker, PT, DPT 12/11/21    6:24 PM    [facility] [address] [place], [place], [address] Phone: [phone]   Fax:   [phone]  Name: Crystal English MRN: [id number] Date of Birth: Apr 30, 1966

## 2021-12-14 ENCOUNTER — Ambulatory Visit: Payer: 59 | Admitting: Rehabilitation

## 2021-12-14 ENCOUNTER — Encounter: Payer: Self-pay | Admitting: Rehabilitation

## 2021-12-14 ENCOUNTER — Other Ambulatory Visit: Payer: Self-pay

## 2021-12-14 DIAGNOSIS — M542 Cervicalgia: Secondary | ICD-10-CM

## 2021-12-14 DIAGNOSIS — M545 Low back pain, unspecified: Secondary | ICD-10-CM

## 2021-12-14 DIAGNOSIS — G44229 Chronic tension-type headache, not intractable: Secondary | ICD-10-CM

## 2021-12-14 NOTE — Patient Instructions (Signed)
Access Code: [email or web address] URL: [email or web address] Date: 12/14/2021 Prepared by: Cameron Sprang  Exercises Seated Thoracic Self-Mobilization - 1 x daily - 7 x weekly - 1 sets - 4 reps - 15-20 secs hold Wall Angels - 1 x daily - 7 x weekly - 2 sets - 10 reps Supine Suboccipital Release with Tennis Balls - 1 x daily - 7 x weekly - 3 sets - 10 reps

## 2021-12-14 NOTE — Therapy (Signed)
[facility] [address] [place], [place], [address] Phone: [phone]   Fax:  [phone]  Physical Therapy Treatment  Patient Details  Name: Crystal English MRN: [id number] Date of Birth: Feb 10, 1966 Referring Provider (PT): Genia Harold, MD   Encounter Date: 12/14/2021   PT End of Session - 12/14/21 1256     Visit Number 2    Number of Visits 17    Date for PT Re-Evaluation 02/07/22    Authorization Type [employer]    PT Start Time 1146    PT Stop Time 1230    PT Time Calculation (min) 44 min    Activity Tolerance Patient tolerated treatment well    Behavior During Therapy [facility] for tasks assessed/performed             Past Medical History:  Diagnosis Date   Agatston coronary artery calcium score less than 100    6.3 coronary Ca score 11/2021 CT   Allergic rhinitis    Anxiety    Asthma    Required inhaled corticosteroid in past.    Benign paroxysmal positional vertigo, unspecified laterality    Body mass index (BMI) 40.0-44.9, adult (HCC)    Cervical radiculopathy    Daily headache    Depression, major 02/25/2013   First episode that required was 2014. Prior episodes that did not require pt to seek medical tx.   DM (diabetes mellitus) ([place])    Fibromyalgia    Hyperlipidemia    Hypertension    2 drug therapy   Low back pain 08/10/2016   Morbid obesity due to excess calories (HCC)    Nausea 09/19/2018   OSA (obstructive sleep apnea)    Sleep study 12/2006 : Moderately Severe OSA. AHI 38.8. O2 sat decreased to 71%.   Primary insomnia    Severe obesity (BMI >= 40) (HCC) 10/08/2012   Obesity Class 3. BMI > 40.    Strain of flexor muscle of right hip 09/19/2018   Tobacco abuse    Urge incontinence     Past Surgical History:  Procedure Laterality Date   DILATION AND CURETTAGE OF UTERUS     ECTOPIC PREGNANCY SURGERY     LAPAROTOMY     for tubal pregnancy   TUBAL LIGATION       There were no vitals filed for this visit.   Subjective Assessment - 12/14/21 1153     Currently in Pain? Yes    Pain Score 6     Pain Location Head    Pain Orientation Right   top and R   Pain Descriptors / Indicators Aching;Headache    Pain Type Chronic pain    Pain Onset More than a month ago    Pain Frequency Constant    Aggravating Factors  nothing in particular    Pain Relieving Factors nothing    Multiple Pain Sites Yes    Pain Score 0   in back right now               [facility] PT Assessment - 12/14/21 1158       ROM / Strength   AROM / PROM / Strength AROM      AROM   Lumbar Flexion 60    Lumbar Extension 10    Lumbar - Right Side Bend 30   more pain than with L SB   Lumbar - Left Side Bend 25   some pain   Lumbar - Right Rotation [facility]  Lumbar - Left Rotation [facility]                           [facility] Adult PT Treatment/Exercise - 12/14/21 1245       Exercises   Exercises Other Exercises    Other Exercises  Performed exercises as given in HEP, see pt instructions for details.      Manual Therapy   Manual Therapy Joint mobilization;Soft tissue mobilization    Manual therapy comments Attempted to reduce pain and improve flexibility.    Joint Mobilization Attempted gentle Grade I to CT junction (prone on blue support pad) however she was very tender to palpation.    Soft tissue mobilization Gentle soft tissue mobilization to peri scapular musculature to what she could tolerate.  Did note trigger points in B levator and upper trap muscles.             Access Code: [email or web address] URL: [email or web address] Date: 12/14/2021 Prepared by: Cameron Sprang   Exercises Seated Thoracic Self-Mobilization - 1 x daily - 7 x weekly - 1 sets - 4 reps - 15-20 secs hold Wall Angels - 1 x daily - 7 x weekly - 2 sets - 10 reps Supine Suboccipital Release with Tennis Balls - 1 x daily - 7 x weekly - 3 sets - 10 reps        PT Education -  12/14/21 1256     Education Details HEP    Person(s) Educated Patient    Methods Explanation;Demonstration;Handout    Comprehension Verbalized understanding;Returned demonstration              PT Short Term Goals - 12/11/21 1826       PT SHORT TERM GOAL #1   Title Pt will participate in assessment of lumbar spine ROM and LE ROM    Time 4    Period Weeks    Status New    Target Date 01/10/22      PT SHORT TERM GOAL #2   Title Pt will initiate HEP focusing on improving mobility of cervical and thoracic spine and lumbar spine/R hip    Time 4    Period Weeks    Status New    Target Date 01/10/22      PT SHORT TERM GOAL #3   Title Pt will initiate walking program    Time 4    Period Weeks    Status New    Target Date 01/10/22               PT Long Term Goals - 12/11/21 1828       PT LONG TERM GOAL #1   Title Pt will demonstrate independence with final HEP and will report return to walking in the park with family without increase in low back pain    Time 8    Period Weeks    Status New    Target Date 02/09/22      PT LONG TERM GOAL #2   Title Pt will demonstrate full neck ROM without increase in neck pain or HA    Time 8    Period Weeks    Status New    Target Date 02/09/22      PT LONG TERM GOAL #3   Title Lumbar/hip ROM goal as indicated    Time 8    Period Weeks    Status New    Target Date 02/09/22      PT  LONG TERM GOAL #4   Title Pt will collaborate with PT regarding work station/computer set up and implement recommendations to improve working posture if indicated    Time 8    Period Weeks    Status New    Target Date 02/09/22      PT LONG TERM GOAL #5   Title Pt will demonstrate improved mechanics for bending and reaching out of BOS to decrease increased strain on low back when performing household chores    Time 8    Period Weeks    Status New    Target Date 02/09/22                   Plan - 12/14/21 1257     Clinical  Impression Statement Skilled session focused on assessment of lumbar ROM.  She reports since last week, back pain has been minimal to not at all.  ROM is grossly normal, she is slightly limited with R and L SB with more pain with R SB.  Will update goal to reflect this.  Also initiated HEP for strengthening large scapular muscles for posture, self mobilization to CT junction/thoracic spine, and suboccipital release to see if this will help in reducing degree of headache pain.  Pt tolerated all well during session.    Personal Factors and Comorbidities Comorbidity 3+;Fitness;Past/Current Experience;Profession;Transportation    Comorbidities Anxiety, asthma, BPPV, cervical radiculopathy, daily HA, depression, DM, fibromyalgia, HLD, HTN, LBP, OSA, insomnia, tobacco abuse, urge incontinence    Examination-Activity Limitations Bend;Lift;Locomotion Level;Stand    Examination-Participation Restrictions Cleaning;Driving;Occupation;Community Activity    Stability/Clinical Decision Making Evolving/Moderate complexity    Rehab Potential Good    PT Frequency 2x / week    PT Duration 8 weeks    PT Treatment/Interventions ADLs/Self Care Home Management;Aquatic Therapy;Cryotherapy;Moist Heat;Functional mobility training;Therapeutic activities;Therapeutic exercise;Balance training;Neuromuscular re-education;Manual techniques;Patient/family education;Passive range of motion;Dry needling;Taping    PT Next Visit Plan I wanted to try a supine thoracic mob over the half foam roll but this may be too intense for right now.  Initiate manual therapy at Cervico-thoracic junction, soft tissue work at upper trap/levator on R, Add to HEP for core strengthening and education on activating core during functional mobility.  Will need progressive walking program.  Will eventually need training for body mechanics when bending, reaching, etc.  Sits at a computer for work - any modifications needed for posture-We did talk about this and she  seems to have a good set up with screen at eye level, hips and knees at 90-90 and feet on floor.  She even sits forward with weight on legs for more ant pelvic tilt.    PT Home Exercise Plan Progressive walking program (I did not initiate this yet, however she probably could if back pain is still under control)    Recommended Other Services [email or web address]    Consulted and Agree with Plan of Care Patient             Patient will benefit from skilled therapeutic intervention in order to improve the following deficits and impairments:  Decreased mobility, Decreased range of motion, Difficulty walking, Increased muscle spasms, Postural dysfunction, Pain  Visit Diagnosis: Cervicalgia  Acute right-sided low back pain without sciatica  Chronic tension-type headache, not intractable     Problem List Patient Active Problem List   Diagnosis Date Noted   Agatston coronary artery calcium score less than 100 12/01/2021   BPPV (benign paroxysmal positional vertigo) 08/11/2020   AKI (acute kidney injury) ([place])  08/11/2020   Left knee pain 01/15/2020   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   Adjustment disorder with anxious mood 11/04/2019   Anxiety 04/03/2019   Urge incontinence 10/12/2018   Dyspepsia 09/19/2018   Fibromyalgia 04/14/2018   Allergic rhinitis 05/04/2017   Migraine 08/10/2016   Diabetes mellitus ([place]) 02/14/2016   Perimenopause 12/23/2014   Chronic urticaria 04/08/2014   Cervical disc disorder with radiculopathy of cervical region 04/08/2014   Depression, major 02/25/2013   Healthcare maintenance 12/16/2012   Tobacco abuse 10/08/2012   Severe obesity (BMI >= 40) (HCC) 10/08/2012   Asthma, chronic 03/09/2010   OSA (obstructive sleep apnea) 01/01/2007   Essential hypertension 11/23/2006    Cameron Sprang, PT, MPT [facility] [address] [place], [place], [address] Phone: [phone]   Fax:  [phone] 12/14/21, 1:05 PM   Name: BRIZEYDA HOLTMEYER MRN: [id number] Date of Birth: 12/29/1965

## 2021-12-21 ENCOUNTER — Ambulatory Visit: Payer: 59 | Admitting: Rehabilitation

## 2021-12-26 ENCOUNTER — Ambulatory Visit: Payer: 59 | Attending: Internal Medicine | Admitting: Physical Therapy

## 2021-12-26 ENCOUNTER — Other Ambulatory Visit: Payer: Self-pay

## 2021-12-26 DIAGNOSIS — M542 Cervicalgia: Secondary | ICD-10-CM

## 2021-12-26 DIAGNOSIS — R262 Difficulty in walking, not elsewhere classified: Secondary | ICD-10-CM

## 2021-12-26 DIAGNOSIS — G44229 Chronic tension-type headache, not intractable: Secondary | ICD-10-CM

## 2021-12-26 DIAGNOSIS — M545 Low back pain, unspecified: Secondary | ICD-10-CM | POA: Diagnosis not present

## 2021-12-26 NOTE — Therapy (Signed)
[facility] [address] [place], [place], [address] Phone: [phone]   Fax:  [phone]  Physical Therapy Treatment  Patient Details  Name: Crystal English MRN: [id number] Date of Birth: 1966/08/28 Referring Provider (PT): Genia Harold, MD   Encounter Date: 12/26/2021   PT End of Session - 12/26/21 2141     Visit Number 3    Number of Visits 17    Date for PT Re-Evaluation 02/07/22    Authorization Type [employer]    PT Start Time 1620    PT Stop Time 1700    PT Time Calculation (min) 40 min    Activity Tolerance Patient tolerated treatment well    Behavior During Therapy [facility] for tasks assessed/performed             Past Medical History:  Diagnosis Date   Agatston coronary artery calcium score less than 100    6.3 coronary Ca score 11/2021 CT   Allergic rhinitis    Anxiety    Asthma    Required inhaled corticosteroid in past.    Benign paroxysmal positional vertigo, unspecified laterality    Body mass index (BMI) 40.0-44.9, adult (HCC)    Cervical radiculopathy    Daily headache    Depression, major 02/25/2013   First episode that required was 2014. Prior episodes that did not require pt to seek medical tx.   DM (diabetes mellitus) ([facility])    Fibromyalgia    Hyperlipidemia    Hypertension    2 drug therapy   Low back pain 08/10/2016   Morbid obesity due to excess calories (HCC)    Nausea 09/19/2018   OSA (obstructive sleep apnea)    Sleep study 12/2006 : Moderately Severe OSA. AHI 38.8. O2 sat decreased to 71%.   Primary insomnia    Severe obesity (BMI >= 40) (HCC) 10/08/2012   Obesity Class 3. BMI > 40.    Strain of flexor muscle of right hip 09/19/2018   Tobacco abuse    Urge incontinence     Past Surgical History:  Procedure Laterality Date   DILATION AND CURETTAGE OF UTERUS     ECTOPIC PREGNANCY SURGERY     LAPAROTOMY     for tubal pregnancy   TUBAL LIGATION       There were no vitals filed for this visit.   Subjective Assessment - 12/26/21 1622     Subjective Back was feeling pretty good after last session.  This weekend pt helped decorate for son's baby shower; did a little bit much and had a long ride home. Had to take pain medication to sleep; pain is going down LLE today.    Pertinent History Had a fall down stairs in 2015 - would have some back pain with prolonged walking;    Diagnostic tests MRI in ER: IMPRESSION:  1. No spinal cord or cauda equina compression.  2. Severe facet arthrosis at L5-S1 with grade 1 anterolisthesis.  3. Mild bilateral neural foraminal stenosis at C6-7.  4. No spinal canal or neural foraminal stenosis of the thoracic or lumbar spine.    Patient Stated Goals Hoping to be more mobile; allow life to go back to normal; go for walks with daughter and grandson    Currently in Pain? Yes    Pain Score 6     Pain Location Back    Pain Orientation Left    Pain Descriptors / Indicators Radiating    Pain Type Acute pain  [place] Adult PT Treatment/Exercise - 12/26/21 1641       Therapeutic Activites    Therapeutic Activities Other Therapeutic Activities    Other Therapeutic Activities Discussed pt's request to reduce number of therapy sessions to decrease amount of time away from work.  Adjusted schedule and only kept 16:15 appointments.      Exercises   Exercises Other Exercises    Other Exercises  Performed LLE PROM to assess change in symptoms: No change in symptoms with single knee to chest, SLR, SLR with ankle DF.  Pt did report reduction in symptoms with SLR with adduction and hip flexion/knee flexion with hip ER (figure 4).  Added figure 4 stretch to HEP but pt noted less benefit when performed actively vs. passively.  Also reviewed lower trunk rotations and added to HEP.  In sitting reviewed lateral side stretch and forward flexion stretch but did not add to HEP at this time.  In supine initiated core  stabilization training with posterior pelvic tilts with 6 second hold x 8 reps with cues for breathing.  Added to pelvic tilts: alternating, single bent knee lifts x 8 reps and then added mini-bridges x 8 reps.               PT Education - 12/26/21 2140     Education Details added to HEP; updated schedule    Person(s) Educated Patient    Methods Explanation;Demonstration;Handout    Comprehension Verbalized understanding;Returned demonstration              PT Short Term Goals - 12/11/21 1826       PT SHORT TERM GOAL #1   Title Pt will participate in assessment of lumbar spine ROM and LE ROM    Time 4    Period Weeks    Status New    Target Date 01/10/22      PT SHORT TERM GOAL #2   Title Pt will initiate HEP focusing on improving mobility of cervical and thoracic spine and lumbar spine/R hip    Time 4    Period Weeks    Status New    Target Date 01/10/22      PT SHORT TERM GOAL #3   Title Pt will initiate walking program    Time 4    Period Weeks    Status New    Target Date 01/10/22               PT Long Term Goals - 12/11/21 1828       PT LONG TERM GOAL #1   Title Pt will demonstrate independence with final HEP and will report return to walking in the park with family without increase in low back pain    Time 8    Period Weeks    Status New    Target Date 02/09/22      PT LONG TERM GOAL #2   Title Pt will demonstrate full neck ROM without increase in neck pain or HA    Time 8    Period Weeks    Status New    Target Date 02/09/22      PT LONG TERM GOAL #3   Title Lumbar/hip ROM goal as indicated    Time 8    Period Weeks    Status New    Target Date 02/09/22      PT LONG TERM GOAL #4   Title Pt will collaborate with PT regarding work station/computer set up and implement recommendations  to improve working posture if indicated    Time 8    Period Weeks    Status New    Target Date 02/09/22      PT LONG TERM GOAL #5   Title Pt will  demonstrate improved mechanics for bending and reaching out of BOS to decrease increased strain on low back when performing household chores    Time 8    Period Weeks    Status New    Target Date 02/09/22              Plan - 12/26/21 2141     Clinical Impression Statement Due to pt reporting new symptoms in low back, radiating into LLE, focused treatment session on addressing L low back pain and initiating core stabilization training.  Most of patient's symptoms begin after increased activity and exertion.  Pt requested to reduce therapy treatment sessions to avoid missing too much time from work.  Will continue to address and progress towards LTG.    Personal Factors and Comorbidities Comorbidity 3+;Fitness;Past/Current Experience;Profession;Transportation    Comorbidities Anxiety, asthma, BPPV, cervical radiculopathy, daily HA, depression, DM, fibromyalgia, HLD, HTN, LBP, OSA, insomnia, tobacco abuse, urge incontinence    Examination-Activity Limitations Bend;Lift;Locomotion Level;Stand    Examination-Participation Restrictions Cleaning;Driving;Occupation;Community Activity    Stability/Clinical Decision Making Evolving/Moderate complexity    Rehab Potential Good    PT Frequency 2x / week    PT Duration 8 weeks    PT Treatment/Interventions ADLs/Self Care Home Management;Aquatic Therapy;Cryotherapy;Moist Heat;Functional mobility training;Therapeutic activities;Therapeutic exercise;Balance training;Neuromuscular re-education;Manual techniques;Patient/family education;Passive range of motion;Dry needling;Taping    PT Next Visit Plan How is L low back?  Keep working on core stability.  try a supine thoracic mob over the half foam roll but this may be too intense for right now.  Initiate manual therapy at Cervico-thoracic junction, soft tissue work at upper trap/levator on R..  Will need progressive walking program.  Will eventually need training for body mechanics when bending, reaching, etc.     PT Home Exercise Plan Progressive walking program (I did not initiate this yet, however she probably could if back pain is still under control)    Consulted and Agree with Plan of Care Patient             Patient will benefit from skilled therapeutic intervention in order to improve the following deficits and impairments:  Decreased mobility, Decreased range of motion, Difficulty walking, Increased muscle spasms, Postural dysfunction, Pain  Visit Diagnosis: Acute right-sided low back pain without sciatica  Cervicalgia  Chronic tension-type headache, not intractable  Difficulty in walking, not elsewhere classified     Problem List Patient Active Problem List   Diagnosis Date Noted   Agatston coronary artery calcium score less than 100 12/01/2021   BPPV (benign paroxysmal positional vertigo) 08/11/2020   AKI (acute kidney injury) ([place]) 08/11/2020   Left knee pain 01/15/2020   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   Adjustment disorder with anxious mood 11/04/2019   Anxiety 04/03/2019   Urge incontinence 10/12/2018   Dyspepsia 09/19/2018   Fibromyalgia 04/14/2018   Allergic rhinitis 05/04/2017   Migraine 08/10/2016   Diabetes mellitus ([place]) 02/14/2016   Perimenopause 12/23/2014   Chronic urticaria 04/08/2014   Cervical disc disorder with radiculopathy of cervical region 04/08/2014   Depression, major 02/25/2013   Healthcare maintenance 12/16/2012   Tobacco abuse 10/08/2012   Severe obesity (BMI >= 40) (HCC) 10/08/2012   Asthma, chronic 03/09/2010   OSA (obstructive sleep apnea) 01/01/2007   Essential hypertension  11/23/2006    Rico Junker, PT, DPT 12/26/21    9:55 PM    [facility] [address] [place], [place], [address] Phone: [phone]   Fax:  [phone]  Name: Crystal English MRN: [id number] Date of Birth: 02-26-1966

## 2021-12-26 NOTE — Patient Instructions (Signed)
Access Code: [email or web address] URL: [email or web address] Date: 12/26/2021 Prepared by: Misty Stanley  Exercises Seated Thoracic Self-Mobilization - 1 x daily - 7 x weekly - 1 sets - 4 reps - 15-20 secs hold Wall Angels - 1 x daily - 7 x weekly - 2 sets - 10 reps Supine Suboccipital Release with Tennis Balls - 1 x daily - 7 x weekly - 3 sets - 10 reps Supine Lower Trunk Rotation - 1 x daily - 7 x weekly - 1 sets - 5 reps Supine Hip External Rotation Stretch (Mirrored) - 2 x daily - 7 x weekly - 3 sets - 30 seconds hold

## 2021-12-30 ENCOUNTER — Ambulatory Visit: Payer: 59 | Admitting: Physical Therapy

## 2022-01-02 ENCOUNTER — Ambulatory Visit: Payer: 59 | Admitting: Physical Therapy

## 2022-01-05 ENCOUNTER — Ambulatory Visit: Payer: 59 | Admitting: Physical Therapy

## 2022-01-09 ENCOUNTER — Ambulatory Visit: Payer: 59 | Admitting: Physical Therapy

## 2022-01-11 ENCOUNTER — Ambulatory Visit: Payer: 59 | Admitting: Neurology

## 2022-01-12 ENCOUNTER — Ambulatory Visit: Payer: 59 | Admitting: Physical Therapy

## 2022-01-16 ENCOUNTER — Ambulatory Visit: Payer: 59 | Admitting: Physical Therapy

## 2022-01-19 ENCOUNTER — Ambulatory Visit: Payer: 59 | Admitting: Physical Therapy

## 2022-01-23 ENCOUNTER — Ambulatory Visit: Payer: 59 | Admitting: Physical Therapy

## 2022-01-26 ENCOUNTER — Ambulatory Visit: Payer: 59 | Admitting: Physical Therapy

## 2022-01-30 ENCOUNTER — Ambulatory Visit: Payer: 59 | Admitting: Physical Therapy

## 2022-02-02 ENCOUNTER — Ambulatory Visit: Payer: 59 | Admitting: Physical Therapy

## 2022-02-23 ENCOUNTER — Encounter: Payer: Self-pay | Admitting: Psychiatry

## 2022-02-23 ENCOUNTER — Ambulatory Visit: Payer: 59 | Admitting: Psychiatry

## 2022-02-23 NOTE — Progress Notes (Deleted)
? ?  CC:  headaches ? ?Follow-up Visit ? ?Last visit: 11/24/21 ? ?Brief HPI: ?56 year old female with a history of asthma, depression, DM, fibromyalgia, HLD, HTN, OSA, cervical stenosis with radiculopathy who follows in clinic for migraines and facial pain (stabbing pain in right ear and temple which radiate to cheek and jaw). ? ?At her last visit, MRI C-spine and L-spine were ordered. She was referred to neck PT. ?Interval History: ?*** ?Headaches***Blurred vision***neck pain***back pain*** ? ?MRI L-spine showed severe facet arthrosis at L5-S1 without spinal or foraminal stenosis. MRI C-spine showed mild bilateral foraminal stenosis at C6-7. ? ?Headache days per month: *** ?Headache free days per month: *** ?Headache severity: *** ? ?Current Headache Regimen: ?Preventative: *** ?Abortive: *** ? ?# of doses of abortive medications per month: *** ? ?Prior Therapies                                  ?Cymbalta 60 mg daily ?Gabapentin - side effects ?Lyrica ?Amitriptyline 25 mg QHS ?Tramadol ?Flexeril ?Ibuprofen ?Tylenol ?Imitrex - lack of efficacy ?  ? ?Physical Exam:  ? ?Vital Signs: ?LMP 03/20/2014  ?GENERAL:  well appearing, in no acute distress, alert  ?SKIN:  Color, texture, turgor normal. No rashes or lesions ?HEAD:  Normocephalic/atraumatic. ?RESP: normal respiratory effort ?MSK:  No gross joint deformities.  ? ?NEUROLOGICAL: ?Mental Status: Alert, oriented to person, place and time, Follows commands, and Speech fluent and appropriate. ?Cranial Nerves: PERRL, face symmetric, no dysarthria, hearing grossly intact ?Motor: moves all extremities equally ?Gait: normal-based. ? ?IMPRESSION: ?*** ? ?PLAN: ?*** ? ? ?Follow-up: *** ? ?I spent a total of *** minutes on the date of the service. Headache education was done. Discussed lifestyle modification including increased oral hydration, decreased caffeine, exercise and stress management. Discussed treatment options including preventive and acute medications, natural  supplements, and infusion therapy. Discussed medication overuse headache and to limit use of acute treatments to no more than 2 days/week or 10 days/month. Discussed medication side effects, adverse reactions and drug interactions. Written educational materials and patient instructions outlining all of the above were given. ? ?Genia Harold, MD ? ?

## 2022-04-17 ENCOUNTER — Encounter (HOSPITAL_COMMUNITY): Payer: Self-pay | Admitting: Emergency Medicine

## 2022-04-17 ENCOUNTER — Ambulatory Visit (HOSPITAL_COMMUNITY)
Admission: EM | Admit: 2022-04-17 | Discharge: 2022-04-17 | Disposition: A | Discharge status: Home / Self Care | Payer: 59 | Attending: Emergency Medicine | Admitting: Emergency Medicine

## 2022-04-17 DIAGNOSIS — W19XXXA Unspecified fall, initial encounter: Secondary | ICD-10-CM

## 2022-04-17 DIAGNOSIS — M25571 Pain in right ankle and joints of right foot: Secondary | ICD-10-CM

## 2022-04-17 MED ORDER — METHOCARBAMOL 500 MG PO TABS: 500.0000 mg | ORAL_TABLET | Freq: Two times a day (BID) | ORAL | 0 refills | Status: DC

## 2022-04-17 NOTE — ED Triage Notes (Signed)
Slipped while stepping off train yesterday. C/o right ankle/knee pain, pain in the entirety of back from neck down. Denies any numbness/tingling

## 2022-04-17 NOTE — ED Notes (Addendum)
Patient no longer in room, patient left before able to check vitals

## 2022-04-17 NOTE — Discharge Instructions (Addendum)
You can take muscle relaxer twice daily as needed.  This medicine may make you drowsy.  I recommend taking in combination with ibuprofen every 4-6 hours.  Please return to the urgent care or emergency department if symptoms worsen or do not improve.

## 2022-04-17 NOTE — ED Provider Notes (Signed)
[facility]    CSN: [id number] Arrival date & time: 04/17/22  0865     History   Chief Complaint Chief Complaint  Patient presents with   Fall    HPI Crystal English is a 56 y.o. female.  Presents after a fall yesterday.  States she was [place] train [place] when she slipped and her bag landed on her right leg.  Did not hit her head, denies loss of consciousness.  Was able to walk after the incident.  Woke up this morning with some stiffness in her muscles.  Reports some right knee and right ankle pain.  She has tried Tylenol which has helped. Denies severe pain, states it is achy. Denies headache, dizziness, LOC, vision changes, chest pain, shortness of breath, weakness, numbness or tingling.  No bowel or bladder incontinence.  No other red flag symptoms.  Past Medical History:  Diagnosis Date   Agatston coronary artery calcium score less than 100    6.3 coronary Ca score 11/2021 CT   Allergic rhinitis    Anxiety    Asthma    Required inhaled corticosteroid in past.    Benign paroxysmal positional vertigo, unspecified laterality    Body mass index (BMI) 40.0-44.9, adult (HCC)    Cervical radiculopathy    Daily headache    Depression, major 02/25/2013   First episode that required was 2014. Prior episodes that did not require pt to seek medical tx.   DM (diabetes mellitus) ([place])    Fibromyalgia    Hyperlipidemia    Hypertension    2 drug therapy   Low back pain 08/10/2016   Morbid obesity due to excess calories (HCC)    Nausea 09/19/2018   OSA (obstructive sleep apnea)    Sleep study 12/2006 : Moderately Severe OSA. AHI 38.8. O2 sat decreased to 71%.   Primary insomnia    Severe obesity (BMI >= 40) (HCC) 10/08/2012   Obesity Class 3. BMI > 40.    Strain of flexor muscle of right hip 09/19/2018   Tobacco abuse    Urge incontinence     Patient Active Problem List   Diagnosis Date Noted   Agatston coronary artery calcium score less  than 100 12/01/2021   BPPV (benign paroxysmal positional vertigo) 08/11/2020   AKI (acute kidney injury) ([place]) 08/11/2020   Left knee pain 01/15/2020   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   Adjustment disorder with anxious mood 11/04/2019   Anxiety 04/03/2019   Urge incontinence 10/12/2018   Dyspepsia 09/19/2018   Fibromyalgia 04/14/2018   Allergic rhinitis 05/04/2017   Migraine 08/10/2016   Diabetes mellitus ([place]) 02/14/2016   Perimenopause 12/23/2014   Chronic urticaria 04/08/2014   Cervical disc disorder with radiculopathy of cervical region 04/08/2014   Depression, major 02/25/2013   Healthcare maintenance 12/16/2012   Tobacco abuse 10/08/2012   Severe obesity (BMI >= 40) ([facility]) 10/08/2012   Asthma, chronic 03/09/2010   OSA (obstructive sleep apnea) 01/01/2007   Essential hypertension 11/23/2006    Past Surgical History:  Procedure Laterality Date   DILATION AND CURETTAGE OF UTERUS     ECTOPIC PREGNANCY SURGERY     LAPAROTOMY     for tubal pregnancy   TUBAL LIGATION      OB History   No obstetric history on file.      Home Medications    Prior to Admission medications   Medication Sig Start Date End Date Taking? Authorizing Provider  atorvastatin (LIPITOR) 10 MG tablet  atorvastatin 10 mg tablet  TAKE 1 TABLET BY MOUTH ONCE DAILY    [provider]  Blood Glucose Monitoring Suppl (ONETOUCH VERIO) w/Device KIT PLEASE CHECK BLOOD SUGAR 3 TIMES DAILY 09/04/18   Helberg, Larkin Ina, MD  glucose blood test strip Please check blood sugars 3 times daily 12/31/18   Ina Homes, MD  hydrOXYzine (ATARAX) 25 MG tablet hydroxyzine HCl 25 mg tablet  TAKE 1 TABLET BY MOUTH ONCE DAILY AT BEDTIME AS NEEDED    [provider]  Lancets (ONETOUCH ULTRASOFT) lancets Please check blood sugars 3 times daily 12/31/18   Helberg, Larkin Ina, MD  lidocaine (LIDODERM) 5 % Place 1 patch onto the skin daily. Remove & Discard patch within 12 hours or as directed by MD 11/27/21   Redwine, Madison  A, PA-C  lisinopril-hydrochlorothiazide (ZESTORETIC) 20-25 MG tablet Take 1 tablet by mouth daily. 08/20/20   Harvie Heck, MD  metFORMIN (GLUCOPHAGE-XR) 500 MG 24 hr tablet Take 2 tablets by mouth twice daily 03/28/21   Jose Persia, MD  methocarbamol (ROBAXIN) 500 MG tablet Take 1 tablet (500 mg total) by mouth 2 (two) times daily. 04/17/22   Lovelyn Sheeran, Wells Guiles, PA-C  pregabalin (LYRICA) 50 MG capsule pregabalin 50 mg capsule  TAKE 1 CAPSULE BY MOUTH ONCE DAILY IN THE EVENING FOR 1 WEEK THEN INCREASE TO 1 CAPSULE TWICE DAILY    [provider]  Semaglutide,0.25 or 0.5MG/DOS, 2 MG/1.5ML SOPN Inject 0.375 mLs (0.5 mg total) into the skin once a week. 05/26/20   Harvie Heck, MD    Family History Family History  Problem Relation Age of Onset   Diabetes Mother    Hypertension Mother    Arthritis Mother    Hyperlipidemia Mother    Hypertension Father    Stroke Father        in his 70's   Heart disease Father        died <47 y.o   Heart disease Sister    Heart disease Brother    Heart disease Maternal Grandmother    Diabetes Maternal Aunt    Cancer Maternal Aunt        breast    Breast cancer Maternal Aunt 60   Asthma Maternal Aunt     Social History Social History   Tobacco Use   Smoking status: Former    Packs/day: 0.03    Years: 30.00    Pack years: 0.90    Types: Cigarettes    Quit date: 05/21/2015    Years since quitting: 6.9   Smokeless tobacco: Never  Vaping Use   Vaping Use: Never used  Substance Use Topics   Alcohol use: Not Currently    Alcohol/week: 0.0 standard drinks    Comment: rarely.   Drug use: No     Allergies   Patient has no known allergies.   Review of Systems Review of Systems As per HPI  Physical Exam Triage Vital Signs ED Triage Vitals [04/17/22 0924]  Enc Vitals Group     BP (!) 175/108     Pulse Rate 92     Resp 16     Temp 98.4 F (36.9 C)     Temp Source Oral     SpO2 96 %     Weight      Height      Head Circumference       Peak Flow      Pain Score 5     Pain Loc      Pain Edu?  Excl. in [place]?    No data found.  Updated Vital Signs BP (!) 175/108 (BP Location: Left Arm)   Pulse 92   Temp 98.4 F (36.9 C) (Oral)   Resp 16   LMP 03/20/2014   SpO2 96%    Physical Exam Vitals and nursing note reviewed.  Constitutional:      General: She is not in acute distress. HENT:     Mouth/Throat:     Mouth: Mucous membranes are moist.     Pharynx: Oropharynx is clear.  Eyes:     Extraocular Movements: Extraocular movements intact.     Conjunctiva/sclera: Conjunctivae normal.     Pupils: Pupils are equal, round, and reactive to light.  Neck:     Comments: No spinal or paraspinal tenderness Cardiovascular:     Rate and Rhythm: Normal rate and regular rhythm.     Heart sounds: Normal heart sounds.  Pulmonary:     Effort: Pulmonary effort is normal.     Breath sounds: Normal breath sounds.  Abdominal:     Palpations: Abdomen is soft.     Tenderness: There is no abdominal tenderness.  Musculoskeletal:        General: No swelling or deformity. Normal range of motion.     Cervical back: Full passive range of motion without pain and normal range of motion. No rigidity.     Comments: No obvious swelling or deformity of the right ankle or knee.  No pain with range of motion.  No pain to palpation.  Pulses and sensation intact  Neurological:     General: No focal deficit present.     Mental Status: She is alert and oriented to person, place, and time.     Cranial Nerves: Cranial nerves 2-12 are intact.     Sensory: Sensation is intact.     Motor: Motor function is intact. No weakness or pronator drift.     Coordination: Coordination is intact.     Gait: Gait is intact. Gait normal.     Deep Tendon Reflexes: Reflexes are normal and symmetric.     Comments: Strength 5/5 all extremities  Psychiatric:        Mood and Affect: Mood normal.     UC Treatments / Results  Labs (all labs ordered are  listed, but only abnormal results are displayed) Labs Reviewed - No data to display  EKG  Radiology No results found.  Procedures Procedures (including critical care time)  Medications Ordered in UC Medications - No data to display  Initial Impression / Assessment and Plan / UC Course  I have reviewed the triage vital signs and the nursing notes.  Pertinent labs & imaging results that were available during my care of the patient were reviewed by me and considered in my medical decision making (see chart for details).   Physical exam and neuro exam unremarkable.  Patient has no focal deficit, full range of motion in all extremities.  I offered her x-ray imaging which she declined.  Patient is able to walk and bear weight on the right leg.  No tenderness to palpation of the joints.  At this time I believe she has some musculoskeletal pain.  Discussed with patient that she may feel stiff or achy for the next few days.  I recommend alternating ibuprofen and tylenol every 4-6 hours in combination with twice daily muscle relaxer.  She has taken muscle relaxers in the past and states it is helpful to her.  Also recommend  to elevate the right extremity and apply ice to the ankle for pain or swelling. We discussed return precautions. Patient agrees to plan. BP in clinic today elevated, she does take blood pressure medicine but fell asleep last night before taking it.  She did take a dose this morning.  Patient left before BP recheck could be obtained. Otherwise discharged in stable condition.  Final Clinical Impressions(s) / UC Diagnoses   Final diagnoses:  Acute right ankle pain  Fall, initial encounter     Discharge Instructions      You can take muscle relaxer twice daily as needed.  This medicine may make you drowsy.  I recommend taking in combination with ibuprofen every 4-6 hours.  Please return to the urgent care or emergency department if symptoms worsen or do not improve.    ED  Prescriptions     Medication Sig Dispense Auth. Provider   methocarbamol (ROBAXIN) 500 MG tablet Take 1 tablet (500 mg total) by mouth 2 (two) times daily. 14 tablet Rowyn Spilde, Wells Guiles, PA-C      PDMP not reviewed this encounter.   Zenaya Ulatowski, Vernice Jefferson 04/17/22 1128

## 2022-06-07 ENCOUNTER — Emergency Department (HOSPITAL_BASED_OUTPATIENT_CLINIC_OR_DEPARTMENT_OTHER)
Admission: EM | Admit: 2022-06-07 | Discharge: 2022-06-07 | Disposition: A | Discharge status: Home / Self Care | Payer: PRIVATE HEALTH INSURANCE | Source: Home / Self Care | Attending: Emergency Medicine | Admitting: Emergency Medicine

## 2022-06-07 ENCOUNTER — Encounter (HOSPITAL_BASED_OUTPATIENT_CLINIC_OR_DEPARTMENT_OTHER): Payer: Self-pay | Admitting: Obstetrics and Gynecology

## 2022-06-07 ENCOUNTER — Emergency Department (HOSPITAL_COMMUNITY)
Admission: EM | Admit: 2022-06-07 | Discharge: 2022-06-07 | Discharge status: Against Medical Advice | Payer: PRIVATE HEALTH INSURANCE | Attending: Emergency Medicine | Admitting: Emergency Medicine

## 2022-06-07 ENCOUNTER — Encounter (HOSPITAL_COMMUNITY): Payer: Self-pay | Admitting: Emergency Medicine

## 2022-06-07 ENCOUNTER — Other Ambulatory Visit: Payer: Self-pay

## 2022-06-07 ENCOUNTER — Emergency Department (HOSPITAL_COMMUNITY): Payer: PRIVATE HEALTH INSURANCE

## 2022-06-07 ENCOUNTER — Ambulatory Visit (HOSPITAL_COMMUNITY)
Admission: EM | Admit: 2022-06-07 | Discharge: 2022-06-07 | Disposition: A | Discharge status: Other Acute Inpatient Hospital | Payer: PRIVATE HEALTH INSURANCE | Attending: Family Medicine | Admitting: Family Medicine

## 2022-06-07 DIAGNOSIS — J45909 Unspecified asthma, uncomplicated: Secondary | ICD-10-CM | POA: Insufficient documentation

## 2022-06-07 DIAGNOSIS — Z79899 Other long term (current) drug therapy: Secondary | ICD-10-CM | POA: Insufficient documentation

## 2022-06-07 DIAGNOSIS — M79622 Pain in left upper arm: Secondary | ICD-10-CM | POA: Insufficient documentation

## 2022-06-07 DIAGNOSIS — R079 Chest pain, unspecified: Secondary | ICD-10-CM | POA: Insufficient documentation

## 2022-06-07 DIAGNOSIS — I10 Essential (primary) hypertension: Secondary | ICD-10-CM | POA: Insufficient documentation

## 2022-06-07 DIAGNOSIS — R200 Anesthesia of skin: Secondary | ICD-10-CM | POA: Diagnosis not present

## 2022-06-07 DIAGNOSIS — R42 Dizziness and giddiness: Secondary | ICD-10-CM | POA: Diagnosis not present

## 2022-06-07 DIAGNOSIS — R0789 Other chest pain: Secondary | ICD-10-CM

## 2022-06-07 DIAGNOSIS — M79621 Pain in right upper arm: Secondary | ICD-10-CM | POA: Insufficient documentation

## 2022-06-07 DIAGNOSIS — M79602 Pain in left arm: Secondary | ICD-10-CM | POA: Diagnosis present

## 2022-06-07 DIAGNOSIS — M79601 Pain in right arm: Secondary | ICD-10-CM | POA: Diagnosis not present

## 2022-06-07 DIAGNOSIS — Z794 Long term (current) use of insulin: Secondary | ICD-10-CM | POA: Insufficient documentation

## 2022-06-07 DIAGNOSIS — E119 Type 2 diabetes mellitus without complications: Secondary | ICD-10-CM

## 2022-06-07 DIAGNOSIS — Z5321 Procedure and treatment not carried out due to patient leaving prior to being seen by health care provider: Secondary | ICD-10-CM | POA: Insufficient documentation

## 2022-06-07 DIAGNOSIS — Z7984 Long term (current) use of oral hypoglycemic drugs: Secondary | ICD-10-CM | POA: Insufficient documentation

## 2022-06-07 LAB — CBC
HCT: 42.8 % (ref 36.0–46.0)
Hemoglobin: 13.8 g/dL (ref 12.0–15.0)
MCH: 28.8 pg (ref 26.0–34.0)
MCHC: 32.2 g/dL (ref 30.0–36.0)
MCV: 89.4 fL (ref 80.0–100.0)
Platelets: 265 10*3/uL (ref 150–400)
RBC: 4.79 MIL/uL (ref 3.87–5.11)
RDW: 13.8 % (ref 11.5–15.5)
WBC: 5.9 10*3/uL (ref 4.0–10.5)
nRBC: 0 % (ref 0.0–0.2)

## 2022-06-07 LAB — BASIC METABOLIC PANEL
Anion gap: 11 (ref 5–15)
BUN: 9 mg/dL (ref 6–20)
CO2: 26 mmol/L (ref 22–32)
Calcium: 9.8 mg/dL (ref 8.9–10.3)
Chloride: 99 mmol/L (ref 98–111)
Creatinine, Ser: 0.94 mg/dL (ref 0.44–1.00)
GFR, Estimated: >60 mL/min (ref >60)
Glucose, Bld: 358 mg/dL — ABNORMAL HIGH (ref 70–99)
Potassium: 4 mmol/L (ref 3.5–5.1)
Sodium: 136 mmol/L (ref 135–145)

## 2022-06-07 LAB — TROPONIN I (HIGH SENSITIVITY)
Troponin I (High Sensitivity): 7 ng/L (ref <18)
Troponin I (High Sensitivity): 14 ng/L (ref <18)

## 2022-06-07 NOTE — Discharge Instructions (Addendum)
Your work-up today was overall reassuring.  Your symptoms improved.  I have given you referral to cardiology.  They will call you to schedule this appointment if you do not hear from them in the next day or 2 please call to get this appointment scheduled.  Follow-up with your primary care provider.  For any worsening symptoms please return to the emergency room.

## 2022-06-07 NOTE — ED Notes (Signed)
Pt stated that she was going to leave and go to the ED [address]. No longer wanted to wait

## 2022-06-07 NOTE — ED Notes (Signed)
Patient is being discharged from the Urgent Care and sent to the Emergency Department via private vehicle . Per Dr Windy Carina, patient is in need of higher level of care due to further evaluation. Patient is aware and verbalizes understanding of plan of care.  Vitals:   06/07/22 0823 06/07/22 0824  BP:  (!) 148/105  Pulse: 78   Resp: 16   Temp: 98.7 F (37.1 C)   SpO2: 99%

## 2022-06-07 NOTE — ED Triage Notes (Signed)
Pt reports waking up not feeling well. She reports arm tingling and pain. She would like to her A1C check today.

## 2022-06-07 NOTE — ED Notes (Signed)
Reviewed AVS/discharge instruction with patient. Time allotted for and all questions answered. Patient is agreeable for d/c and escorted to ed exit by staff.  

## 2022-06-07 NOTE — Discharge Instructions (Addendum)
Please proceed to the emergency room.

## 2022-06-07 NOTE — ED Provider Notes (Signed)
[place]    CSN: [id number] Arrival date & time: 06/07/22  9417      History   Chief Complaint Chief Complaint  Patient presents with   Arm Pain   Blood Sugar Problem    HPI Crystal English is a 56 y.o. female.    Arm Pain   Here for chest pressure, left arm pain and throbbing and numbness that she noticed this morning when she awoke about 7:00.  It is currently almost 9:00.  The left arm pain and throbbing has improved.  She still has a chest pressure and just does not feel good.  She has had urinary frequency and polydipsia lately.  She does have diabetes and takes metformin, but she has not been checking her sugars.  No recent fever, cough symptoms, or dysuria.   Past Medical History:  Diagnosis Date   Agatston coronary artery calcium score less than 100    6.3 coronary Ca score 11/2021 CT   Allergic rhinitis    Anxiety    Asthma    Required inhaled corticosteroid in past.    Benign paroxysmal positional vertigo, unspecified laterality    Body mass index (BMI) 40.0-44.9, adult (HCC)    Cervical radiculopathy    Daily headache    Depression, major 02/25/2013   First episode that required was 2014. Prior episodes that did not require pt to seek medical tx.   DM (diabetes mellitus) ([place])    Fibromyalgia    Hyperlipidemia    Hypertension    2 drug therapy   Low back pain 08/10/2016   Morbid obesity due to excess calories (HCC)    Nausea 09/19/2018   OSA (obstructive sleep apnea)    Sleep study 12/2006 : Moderately Severe OSA. AHI 38.8. O2 sat decreased to 71%.   Primary insomnia    Severe obesity (BMI >= 40) (HCC) 10/08/2012   Obesity Class 3. BMI > 40.    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   Adjustment disorder with anxious mood 11/04/2019   Anxiety 04/03/2019   Urge incontinence 10/12/2018   Dyspepsia 09/19/2018   Fibromyalgia 04/14/2018   Allergic rhinitis 05/04/2017   Migraine 08/10/2016   Diabetes mellitus ([place]) 02/14/2016   Perimenopause 12/23/2014   Chronic urticaria 04/08/2014   Cervical disc disorder with radiculopathy of cervical region 04/08/2014   Depression, major 02/25/2013   Healthcare maintenance 12/16/2012   Tobacco abuse 10/08/2012   Severe obesity (BMI >= 40) ([place]) 10/08/2012   Asthma, chronic 03/09/2010   OSA (obstructive sleep apnea) 01/01/2007   Essential hypertension 11/23/2006    Past Surgical History:  Procedure Laterality Date   DILATION AND CURETTAGE OF UTERUS     ECTOPIC PREGNANCY SURGERY     LAPAROTOMY     for tubal pregnancy   TUBAL LIGATION      OB History   No obstetric history on file.      Home Medications    Prior to Admission medications   Medication Sig Start Date End Date Taking? Authorizing Provider  atorvastatin (LIPITOR) 10 MG tablet atorvastatin 10 mg tablet  TAKE 1 TABLET BY MOUTH ONCE DAILY  [provider]  Blood Glucose Monitoring Suppl (ONETOUCH VERIO) w/Device KIT PLEASE CHECK BLOOD SUGAR 3 TIMES DAILY 09/04/18   Helberg, Larkin Ina, MD  glucose blood test strip Please check blood sugars 3 times daily 12/31/18   Ina Homes, MD  hydrOXYzine (ATARAX) 25 MG tablet hydroxyzine HCl 25 mg tablet  TAKE 1 TABLET BY MOUTH ONCE DAILY AT BEDTIME AS NEEDED    [provider]  Lancets (ONETOUCH ULTRASOFT) lancets Please check blood sugars 3 times daily 12/31/18   Helberg, Larkin Ina, MD  lidocaine (LIDODERM) 5 % Place 1 patch onto the skin daily. Remove & Discard patch within 12 hours or as directed by MD 11/27/21   Redwine, Madison A, PA-C  lisinopril-hydrochlorothiazide (ZESTORETIC) 20-25 MG tablet Take 1 tablet by  mouth daily. 08/20/20   Harvie Heck, MD  metFORMIN (GLUCOPHAGE-XR) 500 MG 24 hr tablet Take 2 tablets by mouth twice daily 03/28/21   Jose Persia, MD  methocarbamol (ROBAXIN) 500 MG tablet Take 1 tablet (500 mg total) by mouth 2 (two) times daily. 04/17/22   Rising, Wells Guiles, PA-C  pregabalin (LYRICA) 50 MG capsule pregabalin 50 mg capsule  TAKE 1 CAPSULE BY MOUTH ONCE DAILY IN THE EVENING FOR 1 WEEK THEN INCREASE TO 1 CAPSULE TWICE DAILY    [provider]  Semaglutide,0.25 or 0.5MG/DOS, 2 MG/1.5ML SOPN Inject 0.375 mLs (0.5 mg total) into the skin once a week. 05/26/20   Harvie Heck, MD    Family History Family History  Problem Relation Age of Onset   Diabetes Mother    Hypertension Mother    Arthritis Mother    Hyperlipidemia Mother    Hypertension Father    Stroke Father        in his 72's   Heart disease Father        died <47 y.o   Heart disease Sister    Heart disease Brother    Heart disease Maternal Grandmother    Diabetes Maternal Aunt    Cancer Maternal Aunt        breast    Breast cancer Maternal Aunt 60   Asthma Maternal Aunt     Social History Social History   Tobacco Use   Smoking status: Former    Packs/day: 0.03    Years: 30.00    Total pack years: 0.90    Types: Cigarettes    Quit date: 05/21/2015    Years since quitting: 7.0   Smokeless tobacco: Never  Vaping Use   Vaping Use: Never used  Substance Use Topics   Alcohol use: Not Currently    Alcohol/week: 0.0 standard drinks of alcohol    Comment: rarely.   Drug use: No     Allergies   Patient has no known allergies.   Review of Systems Review of Systems   Physical Exam Triage Vital Signs ED Triage Vitals  Enc Vitals Group     BP 06/07/22 0824 (!) 148/105     Pulse Rate 06/07/22 0823 78     Resp 06/07/22 0823 16     Temp 06/07/22 0823 98.7 F (37.1 C)     Temp Source 06/07/22 0823 Oral     SpO2 06/07/22 0823 99 %     Weight --      Height --      Head Circumference --       Peak Flow --      Pain Score --      Pain Loc --  Pain Edu? --      Excl. in [place]? --    No data found.  Updated Vital Signs BP (!) 148/105   Pulse 78   Temp 98.7 F (37.1 C) (Oral)   Resp 16   LMP 03/04/2014   SpO2 99%   Visual Acuity Right Eye Distance:   Left Eye Distance:   Bilateral Distance:    Right Eye Near:   Left Eye Near:    Bilateral Near:     Physical Exam Vitals reviewed.  Constitutional:      General: She is not in acute distress.    Appearance: She is not ill-appearing, toxic-appearing or diaphoretic.  HENT:     Mouth/Throat:     Mouth: Mucous membranes are moist.  Eyes:     Extraocular Movements: Extraocular movements intact.     Pupils: Pupils are equal, round, and reactive to light.  Cardiovascular:     Rate and Rhythm: Normal rate and regular rhythm.     Heart sounds: No murmur heard. Pulmonary:     Effort: Pulmonary effort is normal.     Breath sounds: Normal breath sounds.  Musculoskeletal:     Cervical back: Neck supple.  Lymphadenopathy:     Cervical: No cervical adenopathy.  Skin:    Coloration: Skin is not jaundiced or pale.  Neurological:     Mental Status: She is alert and oriented to person, place, and time.  Psychiatric:        Behavior: Behavior normal.      UC Treatments / Results  Labs (all labs ordered are listed, but only abnormal results are displayed) Labs Reviewed - No data to display  EKG   Radiology No results found.  Procedures Procedures (including critical care time)  Medications Ordered in UC Medications - No data to display  Initial Impression / Assessment and Plan / UC Course  I have reviewed the triage vital signs and the nursing notes.  Pertinent labs & imaging results that were available during my care of the patient were reviewed by me and considered in my medical decision making (see chart for details).     Her blood pressure here is 148/105.  Though a lot of her symptoms may be  due to hyperglycemia, with her chest symptoms I think it is best for her to be evaluated in the emergency room with a higher level of care.  She is agreeable and proceeds by private car over to the emergency room Final Clinical Impressions(s) / UC Diagnoses   Final diagnoses:  Atypical chest pain  Type 2 diabetes mellitus without complication, without long-term current use of insulin [facility])     Discharge Instructions      Please proceed to the emergency room    ED Prescriptions   None    PDMP not reviewed this encounter.   Barrett Henle, MD 06/07/22 [phone]

## 2022-06-07 NOTE — ED Triage Notes (Signed)
Patient here with complaint of chest pain and tingling in bilateral hands that was present on waking this morning. Patient also reports dizziness and family history of MI. Patient is alert, oriented, ambulatory, speaking in complete sentences, and in no apparent distress at this time.

## 2022-06-07 NOTE — ED Triage Notes (Signed)
Patient reports to the ER for chest pain. States she left [facility] due to the wait time. Patient reports she has had dizziness as well.

## 2022-06-07 NOTE — ED Provider Notes (Signed)
[place] EMERGENCY DEPT Provider Note   CSN: [id number] Arrival date & time: 06/07/22  1510     History  Chief Complaint  Patient presents with   Chest Pain    Crystal English is a 56 y.o. female.  56 year old female presents today for evaluation of chest pain, and pain to bilateral upper extremities.  Ongoing since this morning.  Evaluated at urgent care and referred to the emergency room for further work-up and management.  Denies prior history of CAD or MI.  Does report CAD in her dad.  Reports currently her chest pain has resolved.  Pain in her arms has improved.  Denies shortness of breath, lightheadedness, palpitations, or other complaints.  Without abdominal pain.  The history is provided by the patient. No language interpreter was used.       Home Medications Prior to Admission medications   Medication Sig Start Date End Date Taking? Authorizing Provider  atorvastatin (LIPITOR) 10 MG tablet atorvastatin 10 mg tablet  TAKE 1 TABLET BY MOUTH ONCE DAILY    [provider]  Blood Glucose Monitoring Suppl (ONETOUCH VERIO) w/Device KIT PLEASE CHECK BLOOD SUGAR 3 TIMES DAILY 09/04/18   Helberg, Larkin Ina, MD  glucose blood test strip Please check blood sugars 3 times daily 12/31/18   Ina Homes, MD  hydrOXYzine (ATARAX) 25 MG tablet hydroxyzine HCl 25 mg tablet  TAKE 1 TABLET BY MOUTH ONCE DAILY AT BEDTIME AS NEEDED    [provider]  Lancets (ONETOUCH ULTRASOFT) lancets Please check blood sugars 3 times daily 12/31/18   Helberg, Larkin Ina, MD  lidocaine (LIDODERM) 5 % Place 1 patch onto the skin daily. Remove & Discard patch within 12 hours or as directed by MD 11/27/21   Redwine, Madison A, PA-C  lisinopril-hydrochlorothiazide (ZESTORETIC) 20-25 MG tablet Take 1 tablet by mouth daily. 08/20/20   Harvie Heck, MD  metFORMIN (GLUCOPHAGE-XR) 500 MG 24 hr tablet Take 2 tablets by mouth twice daily 03/28/21   Jose Persia, MD  methocarbamol  (ROBAXIN) 500 MG tablet Take 1 tablet (500 mg total) by mouth 2 (two) times daily. 04/17/22   Rising, Wells Guiles, PA-C  pregabalin (LYRICA) 50 MG capsule pregabalin 50 mg capsule  TAKE 1 CAPSULE BY MOUTH ONCE DAILY IN THE EVENING FOR 1 WEEK THEN INCREASE TO 1 CAPSULE TWICE DAILY    [provider]  Semaglutide,0.25 or 0.5MG/DOS, 2 MG/1.5ML SOPN Inject 0.375 mLs (0.5 mg total) into the skin once a week. 05/26/20   Harvie Heck, MD      Allergies    Patient has no known allergies.    Review of Systems   Review of Systems  Constitutional:  Negative for chills and fever.  Respiratory:  Negative for shortness of breath.   Cardiovascular:  Positive for chest pain (now resolved). Negative for palpitations and leg swelling.  Gastrointestinal:  Negative for abdominal pain, nausea and vomiting.  Neurological:  Negative for syncope, weakness and light-headedness.  All other systems reviewed and are negative.   Physical Exam Updated Vital Signs BP 137/90   Pulse 92   Temp 98 F (36.7 C) (Oral)   Resp 18   Ht 5' 1"  (1.549 m)   Wt 95 kg   LMP 03/04/2014   SpO2 100%   BMI 39.57 kg/m  Physical Exam Vitals and nursing note reviewed.  Constitutional:      General: She is not in acute distress.    Appearance: Normal appearance. She is not ill-appearing.  HENT:  Head: Normocephalic and atraumatic.     Nose: Nose normal.  Eyes:     General: No scleral icterus.    Extraocular Movements: Extraocular movements intact.     Conjunctiva/sclera: Conjunctivae normal.  Cardiovascular:     Rate and Rhythm: Normal rate and regular rhythm.     Pulses: Normal pulses.     Heart sounds: Normal heart sounds.  Pulmonary:     Effort: Pulmonary effort is normal. No respiratory distress.     Breath sounds: Normal breath sounds. No wheezing or rales.  Abdominal:     General: There is no distension.     Tenderness: There is no abdominal tenderness.  Musculoskeletal:        General: Normal range of  motion.     Cervical back: Normal range of motion.  Skin:    General: Skin is warm and dry.  Neurological:     General: No focal deficit present.     Mental Status: She is alert. Mental status is at baseline.     Comments: Bilateral upper extremities with full range of motion, and 5/5 strength in extensor and flexor muscle groups.  Sensation intact and symmetrical.  Cranial nerves III through XII intact.  Tongue midline.  Without pronator drift.  Without dysarthria.     ED Results / Procedures / Treatments   Labs (all labs ordered are listed, but only abnormal results are displayed) Labs Reviewed  TROPONIN I (HIGH SENSITIVITY)  TROPONIN I (HIGH SENSITIVITY)    EKG None  Radiology DG Chest 2 View  Result Date: 06/07/2022 CLINICAL DATA:  Chest pain EXAM: CHEST - 2 VIEW COMPARISON:  Chest x-ray dated February 22, 2017 FINDINGS: The heart size and mediastinal contours are within normal limits. Both lungs are clear. The visualized skeletal structures are unremarkable. IMPRESSION: No active cardiopulmonary disease. Electronically Signed   By: Yetta Glassman M.D.   On: 06/07/2022 10:00    Procedures Procedures    Medications Ordered in ED Medications - No data to display  ED Course/ Medical Decision Making/ A&P           HEART Score: 2                Medical Decision Making  Medical Decision Making / ED Course   This patient presents to the ED for concern of chest pain, this involves an extensive number of treatment options, and is a complaint that carries with it a high risk of complications and morbidity.  The differential diagnosis includes ACS, pneumonia, MSK pain, GERD  MDM: 56 year old female presents today for evaluation of chest pain, pain in bilateral upper extremities.  Without other associated symptoms.  Referred to the emergency room from urgent care.  Initially went to [facility] had a work-up done and presented to [facility] emergency room due to wait time.  Work-up  so far shows unremarkable chest x-ray, CBC without leukocytosis or anemia.  CMP which is unremarkable.  Troponin initially was 7 repeat 14.  EKG without acute ischemic changes.  Doubt ACS.  Elevated heart score given history of diabetes, hypertension, hyperlipidemia, obesity.  Atypical chest pain.  Will refer to cardiology for further work-up of atypical chest pain.  Return precautions discussed.  Patient voices understanding and is in agreement with plan.  Discussed follow-up with PCP.   Lab Tests: -I ordered, reviewed, and interpreted labs.   The pertinent results include:   Labs Reviewed  TROPONIN I (HIGH SENSITIVITY)  TROPONIN I (HIGH SENSITIVITY)  EKG  EKG Interpretation  Date/Time:    Ventricular Rate:    PR Interval:    QRS Duration:   QT Interval:    QTC Calculation:   R Axis:     Text Interpretation:           Imaging Studies ordered: I ordered imaging studies including CXR I independently visualized and interpreted imaging. I agree with the radiologist interpretation   Medicines ordered and prescription drug management: No orders of the defined types were placed in this encounter.   -I have reviewed the patients home medicines and have made adjustments as needed  Reevaluation: After the interventions noted above, I reevaluated the patient and found that they have :resolved  Co morbidities that complicate the patient evaluation  Past Medical History:  Diagnosis Date   Agatston coronary artery calcium score less than 100    6.3 coronary Ca score 11/2021 CT   Allergic rhinitis    Anxiety    Asthma    Required inhaled corticosteroid in past.    Benign paroxysmal positional vertigo, unspecified laterality    Body mass index (BMI) 40.0-44.9, adult ([place])    Cervical radiculopathy    Daily headache    Depression, major 02/25/2013   First episode that required was 2014. Prior episodes that did not require pt to seek medical tx.   DM (diabetes mellitus)  ([place])    Fibromyalgia    Hyperlipidemia    Hypertension    2 drug therapy   Low back pain 08/10/2016   Morbid obesity due to excess calories (HCC)    Nausea 09/19/2018   OSA (obstructive sleep apnea)    Sleep study 12/2006 : Moderately Severe OSA. AHI 38.8. O2 sat decreased to 71%.   Primary insomnia    Severe obesity (BMI >= 40) (HCC) 10/08/2012   Obesity Class 3. BMI > 40.    Strain of flexor muscle of right hip 09/19/2018   Tobacco abuse    Urge incontinence       Dispostion: Patient is appropriate for discharge.  Cardiology referral provided.  Return precautions discussed.  Discussed follow-up with PCP.  Final Clinical Impression(s) / ED Diagnoses Final diagnoses:  Chest pain, unspecified type    Rx / DC Orders ED Discharge Orders          Ordered    Ambulatory referral to Cardiology       Comments: If you have not heard from the Cardiology office within the next 72 hours please call [phone].   06/07/22 1754              Evlyn Courier, PA-C 06/07/22 1755    Regan Lemming, MD 06/07/22 2357

## 2022-06-07 NOTE — ED Provider Triage Note (Signed)
Emergency Medicine Provider Triage Evaluation Note  Crystal English , a 56 y.o. female  was evaluated in triage.  Pt complains of bilateral arm pain/numbness.  Also having discomfort in her chest.  Started this morning, she feels off.  Was seen in urgent care sent to ED for further evaluation.  No shortness of breath, nausea, vomiting.  Review of Systems  Per HPI  Physical Exam  BP (!) 148/105 (BP Location: Right Arm)   Pulse 83   Temp 97.9 F (36.6 C) (Oral)   LMP 03/04/2014   SpO2 97%  Gen:   Awake, no distress   Resp:  Normal effort  MSK:   Moves extremities without difficulty  Other:  s1s2  Medical Decision Making  Medically screening exam initiated at 9:34 AM.  Appropriate orders placed.  EUSTACIA URBANEK was informed that the remainder of the evaluation will be completed by another provider, this initial triage assessment does not replace that evaluation, and the importance of remaining in the ED until their evaluation is complete.     Sherrill Raring, PA-C 06/07/22 [phone]

## 2022-06-19 NOTE — Progress Notes (Deleted)
Cardiology Office Note    Date:  06/19/2022   ID:  English, Crystal [birth year], MRN [id number]   PCP:  [name], [place]  Cardiologist:  Fransico Him, MD *** Advanced Practice Provider:  No care team member to display Electrophysiologist:  None   [phone]   No chief complaint on file.   History of Present Illness:  Crystal English is a 56 y.o. female with a history of asthma, anxiety, diabetes mellitus, hypertension, hyperlipidemia, fibromyalgia.She has a strong fm hx of heart disease. He had a CVA in late 38's and then died of a massive MI in his early 40's. Her maternal GF died of an MI late age in life. She has a strong fm hx of CVAs in the past as well    Patient saw Dr. Radford Pax 10/2021 for evaluation of palpitations.  Echo normal LVEF D1DD, Coronary calcium score 6.3, monitor NSR, normal monitor.  Was sent to ED from urgent care 06/07/22 with chest pain into both arms. Troponin 7,14. EKG no change.    Past Medical History:  Diagnosis Date   Agatston coronary artery calcium score less than 100    6.3 coronary Ca score 11/2021 CT   Allergic rhinitis    Anxiety    Asthma    Required inhaled corticosteroid in past.    Benign paroxysmal positional vertigo, unspecified laterality    Body mass index (BMI) 40.0-44.9, adult (HCC)    Cervical radiculopathy    Daily headache    Depression, major 02/25/2013   First episode that required was 2014. Prior episodes that did not require pt to seek medical tx.   DM (diabetes mellitus) ([place])    Fibromyalgia    Hyperlipidemia    Hypertension    2 drug therapy   Low back pain 08/10/2016   Morbid obesity due to excess calories (HCC)    Nausea 09/19/2018   OSA (obstructive sleep apnea)    Sleep study 12/2006 : Moderately Severe OSA. AHI 38.8. O2 sat decreased to 71%.   Primary insomnia    Severe obesity (BMI >= 40) (HCC) 10/08/2012   Obesity Class 3. BMI > 40.    Strain of  flexor muscle of right hip 09/19/2018   Tobacco abuse    Urge incontinence     Past Surgical History:  Procedure Laterality Date   DILATION AND CURETTAGE OF UTERUS     ECTOPIC PREGNANCY SURGERY     LAPAROTOMY     for tubal pregnancy   TUBAL LIGATION      Current Medications: No outpatient medications have been marked as taking for the 06/21/22 encounter (Appointment) with Crystal Burn, PA-C.     Allergies:   Patient has no known allergies.   Social History   Socioeconomic History   Marital status: Single    Spouse name: Not on file   Number of children: 3   Years of education: some coll.   Highest education level: Not on file  Occupational History   Occupation: Claims processing    Employer: [employer]  Tobacco Use   Smoking status: Former    Packs/day: 0.03    Years: 30.00    Total pack years: 0.90    Types: Cigarettes    Quit date: 05/21/2015    Years since quitting: 7.0    Passive exposure: Never   Smokeless tobacco: Never  Vaping Use   Vaping Use: Never used  Substance  and Sexual Activity   Alcohol use: Not Currently    Alcohol/week: 0.0 standard drinks of alcohol    Comment: rarely.   Drug use: No   Sexual activity: Not on file  Other Topics Concern   Not on file  Social History Narrative   Lives in [place] alone. 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   Social Determinants of Health   Financial Resource Strain: Not on file  Food Insecurity: Not on file  Transportation Needs: Not on file  Physical Activity: Not on file  Stress: Not on file  Social Connections: Not on file     Family History:  The patient's ***family history includes Arthritis in her mother; Asthma in her maternal aunt; Breast cancer (age of onset: 71) in her maternal aunt; Cancer in her maternal aunt; Diabetes in her maternal aunt and mother; Heart disease in her brother, father, maternal grandmother, and sister; Hyperlipidemia in her mother; Hypertension in her  father and mother; Stroke in her father.   ROS:   Please see the history of present illness.    ROS All other systems reviewed and are negative.   PHYSICAL EXAM:   VS:  LMP 03/04/2014   Physical Exam  GEN: Well nourished, well developed, in no acute distress  HEENT: normal  Neck: no JVD, carotid bruits, or masses Cardiac:RRR; no murmurs, rubs, or gallops  Respiratory:  clear to auscultation bilaterally, normal work of breathing GI: soft, nontender, nondistended, + BS Ext: without cyanosis, clubbing, or edema, Good distal pulses bilaterally MS: no deformity or atrophy  Skin: warm and dry, no rash Neuro:  Alert and Oriented x 3, Strength and sensation are intact Psych: euthymic mood, full affect  Wt Readings from Last 3 Encounters:  06/07/22 209 lb 7 oz (95 kg)  11/24/21 209 lb (94.8 kg)  10/20/21 216 lb (98 kg)      Studies/Labs Reviewed:   EKG:  EKG is*** ordered today.  The ekg ordered today demonstrates ***  Recent Labs: 06/07/2022: BUN 9; Creatinine, Ser 0.94; Hemoglobin 13.8; Platelets 265; Potassium 4.0; Sodium 136   Lipid Panel    Component Value Date/Time   CHOL 163 02/27/2014 1650   TRIG 84 02/27/2014 1650   HDL 44 02/27/2014 1650   CHOLHDL 3.7 02/27/2014 1650   VLDL 17 02/27/2014 1650   LDLCALC 102 (H) 02/27/2014 1650    Additional studies/ records that were reviewed today include:  Monitor 11/2021 Baseline EKG showed normal sinus rhythm with average heart rate 94 bpm. The heart rate ranged from 69 to 156 bpm.   Coronary calcium score 11/2021 FINDINGS: Coronary Calcium Score:   Left main: 0   Left anterior descending artery: 0   Left circumflex artery: 0   Right coronary artery: 6.3   Total: 6.3   Percentile: 82nd   Pericardium: Normal.   Non-cardiac: See separate report from [facility].   IMPRESSION: Coronary calcium score of 6.3. This was 82nd percentile for age-, race-, and sex-matched controls.   2Decho 11/2021 IMPRESSIONS      1. Left ventricular ejection fraction, by estimation, is 50 to 55%. The  left ventricle has low normal function. The left ventricle has no regional  wall motion abnormalities. Left ventricular diastolic parameters are  consistent with Grade I diastolic  dysfunction (impaired relaxation). The average left ventricular global  longitudinal strain is -22.2 %. The global longitudinal strain is normal.   2. Right ventricular systolic function is normal. The right ventricular  size is  normal. There is normal pulmonary artery systolic pressure.   3. The mitral valve is normal in structure. No evidence of mitral valve  regurgitation. No evidence of mitral stenosis.   4. The aortic valve is normal in structure. Aortic valve regurgitation is  not visualized.   FINDINGS    Risk Assessment/Calculations:   {Does this patient have ATRIAL FIBRILLATION?:[phone]}     ASSESSMENT:    No diagnosis found.   PLAN:  In order of problems listed above:  Chest pain-coronary calcium score 6.3 11/2021  HTN  Palpitations-monitor without arrhythmia 11/2021  Obesity  Family history of CAD  DM  Shared Decision Making/Informed Consent   {Are you ordering a CV Procedure (e.g. stress test, cath, DCCV, TEE, etc)?   Press F2        :[id number]}    Medication Adjustments/Labs and Tests Ordered: Current medicines are reviewed at length with the patient today.  Concerns regarding medicines are outlined above.  Medication changes, Labs and Tests ordered today are listed in the Patient Instructions below. There are no Patient Instructions on file for this visit.   Sumner Boast, PA-C  06/19/2022 10:46 AM    [facility] [place], [place], [place]  [address] Phone: [phone]; Fax: [phone]

## 2022-06-21 ENCOUNTER — Ambulatory Visit: Payer: PRIVATE HEALTH INSURANCE | Admitting: Physician Assistant

## 2022-06-21 DIAGNOSIS — R002 Palpitations: Secondary | ICD-10-CM

## 2022-06-21 DIAGNOSIS — I10 Essential (primary) hypertension: Secondary | ICD-10-CM

## 2022-06-21 DIAGNOSIS — Z8249 Family history of ischemic heart disease and other diseases of the circulatory system: Secondary | ICD-10-CM

## 2022-06-21 DIAGNOSIS — R079 Chest pain, unspecified: Secondary | ICD-10-CM

## 2022-06-28 ENCOUNTER — Encounter (INDEPENDENT_AMBULATORY_CARE_PROVIDER_SITE_OTHER): Payer: Self-pay

## 2022-06-29 NOTE — Progress Notes (Deleted)
Cardiology Office Note    Date:  06/29/2022   ID:  Crystal English, Crystal English 1965/12/23, MRN [id number]   PCP:  [name], [place]  Cardiologist:  Fransico Him, MD   Advanced Practice Provider:  No care team member to display Electrophysiologist:  None   [id number]}   No chief complaint on file.   History of Present Illness:  Crystal English is a 56 y.o. female with a history of asthma, anxiety, diabetes mellitus, hypertension, hyperlipidemia, fibromyalgia.She has a strong fm hx of heart disease. He had a CVA in late 60's and then died of a massive MI in his early 98's. Her maternal GF died of an MI late age in life. She has a strong fm hx of CVAs in the past as well    Patient saw Dr. Radford Pax 10/2021 for evaluation of palpitations.  Echo normal LVEF D1DD, Coronary calcium score 6.3, monitor NSR, normal monitor.  Was sent to ED from urgent care 06/07/22 with chest pain into both arms. Troponin 7,14. EKG no change. In ED 07/02/22 with BS >500         Past Medical History:  Diagnosis Date   Agatston coronary artery calcium score less than 100    6.3 coronary Ca score 11/2021 CT   Allergic rhinitis    Anxiety    Asthma    Required inhaled corticosteroid in past.    Benign paroxysmal positional vertigo, unspecified laterality    Body mass index (BMI) 40.0-44.9, adult (HCC)    Cervical radiculopathy    Daily headache    Depression, major 02/25/2013   First episode that required was 2014. Prior episodes that did not require pt to seek medical tx.   DM (diabetes mellitus) ([place])    Fibromyalgia    Hyperlipidemia    Hypertension    2 drug therapy   Low back pain 08/10/2016   Morbid obesity due to excess calories (HCC)    Nausea 09/19/2018   OSA (obstructive sleep apnea)    Sleep study 12/2006 : Moderately Severe OSA. AHI 38.8. O2 sat decreased to 71%.   Primary insomnia    Severe obesity (BMI >= 40) (HCC) 10/08/2012   Obesity  Class 3. BMI > 40.    Strain of flexor muscle of right hip 09/19/2018   Tobacco abuse    Urge incontinence     Past Surgical History:  Procedure Laterality Date   DILATION AND CURETTAGE OF UTERUS     ECTOPIC PREGNANCY SURGERY     LAPAROTOMY     for tubal pregnancy   TUBAL LIGATION      Current Medications: No outpatient medications have been marked as taking for the 07/05/22 encounter (Appointment) with Imogene Burn, PA-C.     Allergies:   Patient has no known allergies.   Social History   Socioeconomic History   Marital status: Single    Spouse name: Not on file   Number of children: 3   Years of education: some coll.   Highest education level: Not on file  Occupational History   Occupation: Claims processing    Employer: [employer]  Tobacco Use   Smoking status: Former    Packs/day: 0.03    Years: 30.00    Total pack years: 0.90    Types: Cigarettes    Quit date: 05/21/2015    Years since quitting: 7.1    Passive exposure: Never   Smokeless tobacco:  Never  Vaping Use   Vaping Use: Never used  Substance and Sexual Activity   Alcohol use: Not Currently    Alcohol/week: 0.0 standard drinks of alcohol    Comment: rarely.   Drug use: No   Sexual activity: Not on file  Other Topics Concern   Not on file  Social History Narrative   Lives in [place] alone. 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   Social Determinants of Health   Financial Resource Strain: Not on file  Food Insecurity: Not on file  Transportation Needs: Not on file  Physical Activity: Not on file  Stress: Not on file  Social Connections: Not on file     Family History:  The patient's ***family history includes Arthritis in her mother; Asthma in her maternal aunt; Breast cancer (age of onset: 20) in her maternal aunt; Cancer in her maternal aunt; Diabetes in her maternal aunt and mother; Heart disease in her brother, father, maternal grandmother, and sister; Hyperlipidemia in her  mother; Hypertension in her father and mother; Stroke in her father.   ROS:   Please see the history of present illness.    ROS All other systems reviewed and are negative.   PHYSICAL EXAM:   VS:  LMP 03/04/2014   Physical Exam  GEN: Well nourished, well developed, in no acute distress  HEENT: normal  Neck: no JVD, carotid bruits, or masses Cardiac:RRR; no murmurs, rubs, or gallops  Respiratory:  clear to auscultation bilaterally, normal work of breathing GI: soft, nontender, nondistended, + BS Ext: without cyanosis, clubbing, or edema, Good distal pulses bilaterally MS: no deformity or atrophy  Skin: warm and dry, no rash Neuro:  Alert and Oriented x 3, Strength and sensation are intact Psych: euthymic mood, full affect  Wt Readings from Last 3 Encounters:  06/07/22 209 lb 7 oz (95 kg)  11/24/21 209 lb (94.8 kg)  10/20/21 216 lb (98 kg)      Studies/Labs Reviewed:   EKG:  EKG is*** ordered today.  The ekg ordered today demonstrates ***  Recent Labs: 06/07/2022: BUN 9; Creatinine, Ser 0.94; Hemoglobin 13.8; Platelets 265; Potassium 4.0; Sodium 136   Lipid Panel    Component Value Date/Time   CHOL 163 02/27/2014 1650   TRIG 84 02/27/2014 1650   HDL 44 02/27/2014 1650   CHOLHDL 3.7 02/27/2014 1650   VLDL 17 02/27/2014 1650   LDLCALC 102 (H) 02/27/2014 1650    Additional studies/ records that were reviewed today include:  FINDINGS: Coronary Calcium Score:   Left main: 0   Left anterior descending artery: 0   Left circumflex artery: 0   Right coronary artery: 6.3   Total: 6.3   Percentile: 82nd   Pericardium: Normal.   Non-cardiac: See separate report from [facility].   IMPRESSION: Coronary calcium score of 6.3. This was 82nd percentile for age-, race-, and sex-matched controls.    IMPRESSIONS     1. Left ventricular ejection fraction, by estimation, is 50 to 55%. The  left ventricle has low normal function. The left ventricle has no  regional  wall motion abnormalities. Left ventricular diastolic parameters are  consistent with Grade I diastolic  dysfunction (impaired relaxation). The average left ventricular global  longitudinal strain is -22.2 %. The global longitudinal strain is normal.   2. Right ventricular systolic function is normal. The right ventricular  size is normal. There is normal pulmonary artery systolic pressure.   3. The mitral valve is normal in structure.  No evidence of mitral valve  regurgitation. No evidence of mitral stenosis.   4. The aortic valve is normal in structure. Aortic valve regurgitation is  not visualized.   FINDINGS    Risk Assessment/Calculations:   {Does this patient have ATRIAL FIBRILLATION?:[phone]}     ASSESSMENT:    No diagnosis found.   PLAN:  In order of problems listed above: Chest pain-coronary calcium score 6.3 11/2021  HTN  Palpitations-monitor without arrhythmia 11/2021  Obesity  Family history of CAD  DM   Shared Decision Making/Informed Consent   {Are you ordering a CV Procedure (e.g. stress test, cath, DCCV, TEE, etc)?   Press F2        :[id number]}    Medication Adjustments/Labs and Tests Ordered: Current medicines are reviewed at length with the patient today.  Concerns regarding medicines are outlined above.  Medication changes, Labs and Tests ordered today are listed in the Patient Instructions below. There are no Patient Instructions on file for this visit.   Sumner Boast, PA-C  06/29/2022 8:18 AM    [facility] [place], [place], [place]  [address] Phone: [phone]; Fax: [phone]

## 2022-07-02 ENCOUNTER — Other Ambulatory Visit: Payer: Self-pay

## 2022-07-02 ENCOUNTER — Emergency Department (HOSPITAL_BASED_OUTPATIENT_CLINIC_OR_DEPARTMENT_OTHER)
Admission: EM | Admit: 2022-07-02 | Discharge: 2022-07-02 | Disposition: A | Discharge status: Home / Self Care | Payer: PRIVATE HEALTH INSURANCE | Attending: Emergency Medicine | Admitting: Emergency Medicine

## 2022-07-02 ENCOUNTER — Encounter (HOSPITAL_BASED_OUTPATIENT_CLINIC_OR_DEPARTMENT_OTHER): Payer: Self-pay

## 2022-07-02 DIAGNOSIS — E1165 Type 2 diabetes mellitus with hyperglycemia: Secondary | ICD-10-CM | POA: Insufficient documentation

## 2022-07-02 DIAGNOSIS — Z79899 Other long term (current) drug therapy: Secondary | ICD-10-CM | POA: Insufficient documentation

## 2022-07-02 DIAGNOSIS — R739 Hyperglycemia, unspecified: Secondary | ICD-10-CM | POA: Diagnosis present

## 2022-07-02 DIAGNOSIS — R944 Abnormal results of kidney function studies: Secondary | ICD-10-CM | POA: Insufficient documentation

## 2022-07-02 DIAGNOSIS — Z7984 Long term (current) use of oral hypoglycemic drugs: Secondary | ICD-10-CM | POA: Insufficient documentation

## 2022-07-02 LAB — URINALYSIS, ROUTINE W REFLEX MICROSCOPIC
Bilirubin Urine: NEGATIVE
Glucose, UA: >1000 mg/dL — AB
Hgb urine dipstick: NEGATIVE
Ketones, ur: NEGATIVE mg/dL
Leukocytes,Ua: NEGATIVE
Nitrite: NEGATIVE
Protein, ur: NEGATIVE mg/dL
Specific Gravity, Urine: 1.03 (ref 1.005–1.030)
pH: 5.5 (ref 5.0–8.0)

## 2022-07-02 LAB — BASIC METABOLIC PANEL
Anion gap: 10 (ref 5–15)
BUN: 12 mg/dL (ref 6–20)
CO2: 28 mmol/L (ref 22–32)
Calcium: 9.8 mg/dL (ref 8.9–10.3)
Chloride: 97 mmol/L — ABNORMAL LOW (ref 98–111)
Creatinine, Ser: 1.09 mg/dL — ABNORMAL HIGH (ref 0.44–1.00)
GFR, Estimated: 59 mL/min — ABNORMAL LOW (ref >60)
Glucose, Bld: 538 mg/dL (ref 70–99)
Potassium: 3.9 mmol/L (ref 3.5–5.1)
Sodium: 135 mmol/L (ref 135–145)

## 2022-07-02 LAB — CBC
HCT: 44.2 % (ref 36.0–46.0)
Hemoglobin: 14.2 g/dL (ref 12.0–15.0)
MCH: 29.2 pg (ref 26.0–34.0)
MCHC: 32.1 g/dL (ref 30.0–36.0)
MCV: 90.9 fL (ref 80.0–100.0)
Platelets: 253 10*3/uL (ref 150–400)
RBC: 4.86 MIL/uL (ref 3.87–5.11)
RDW: 13.5 % (ref 11.5–15.5)
WBC: 7 10*3/uL (ref 4.0–10.5)
nRBC: 0 % (ref 0.0–0.2)

## 2022-07-02 LAB — CBG MONITORING, ED
Glucose-Capillary: 507 mg/dL (ref 70–99)
Glucose-Capillary: 286 mg/dL — ABNORMAL HIGH (ref 70–99)

## 2022-07-02 MED ORDER — INSULIN ASPART 100 UNIT/ML IJ SOLN
10.0000 [IU] | INTRAMUSCULAR | Status: AC
Start: 2022-07-02 — End: 2022-07-02
  Administered 2022-07-02: 10 [IU] via INTRAVENOUS

## 2022-07-02 MED ORDER — LACTATED RINGERS IV BOLUS
1000.0000 mL | Freq: Once | INTRAVENOUS | Status: AC
  Administered 2022-07-02: 1000 mL via INTRAVENOUS

## 2022-07-02 NOTE — ED Triage Notes (Signed)
Patient here POV from Home.  Endorses only taking Metformin for Diabetes (Also prescribed Ozempic). States CBG at Home today was 583.   More recently the patient endorses Polydipsia, Polyuria.   NAD Noted during Triage. A&Ox4. GCS 15. Ambulatory.

## 2022-07-02 NOTE — ED Notes (Signed)
Dr. Jeanell Sparrow aware of elevated blood sugar of 538.

## 2022-07-02 NOTE — Discharge Instructions (Addendum)
Please call your doctor's office first thing in the morning. They can call in a different medication and you can begin this tomorrow Additionally, they need to follow your blood sugars and adjust medications as needed.

## 2022-07-02 NOTE — ED Provider Notes (Signed)
[facility] Provider Note   CSN: [id number] Arrival date & time: 07/02/22  1839     History {Add pertinent medical, surgical, social history, OB history to HPI:1} Chief Complaint  Patient presents with   Hyperglycemia    Crystal English is a 56 y.o. female.  HPI 56 year old female history of type 2 diabetes who is on metformin and supposed to be on semaglutide presents today with hyperglycemia.  She reports she has been taking her metformin but has been unable to get the semaglutide.  She has been having increased frequency of urination and thirst.  Reports some ongoing mental acuity changes.  Not noted any lateralized deficits.  Not having any chest pain, cough, or fever.  She states that she came in today because her sister is her come.  She has not seen her primary care doctor for follow-up.    Home Medications Prior to Admission medications   Medication Sig Start Date End Date Taking? Authorizing Provider  atorvastatin (LIPITOR) 10 MG tablet atorvastatin 10 mg tablet  TAKE 1 TABLET BY MOUTH ONCE DAILY    [provider]  Blood Glucose Monitoring Suppl (ONETOUCH VERIO) w/Device KIT PLEASE CHECK BLOOD SUGAR 3 TIMES DAILY 09/04/18   Helberg, Larkin Ina, MD  glucose blood test strip Please check blood sugars 3 times daily 12/31/18   Ina Homes, MD  hydrOXYzine (ATARAX) 25 MG tablet hydroxyzine HCl 25 mg tablet  TAKE 1 TABLET BY MOUTH ONCE DAILY AT BEDTIME AS NEEDED    [provider]  Lancets (ONETOUCH ULTRASOFT) lancets Please check blood sugars 3 times daily 12/31/18   Helberg, Larkin Ina, MD  lidocaine (LIDODERM) 5 % Place 1 patch onto the skin daily. Remove & Discard patch within 12 hours or as directed by MD 11/27/21   Redwine, Madison A, PA-C  lisinopril-hydrochlorothiazide (ZESTORETIC) 20-25 MG tablet Take 1 tablet by mouth daily. 08/20/20   Harvie Heck, MD  metFORMIN (GLUCOPHAGE-XR) 500 MG 24 hr tablet Take 2 tablets by mouth twice  daily 03/28/21   Jose Persia, MD  methocarbamol (ROBAXIN) 500 MG tablet Take 1 tablet (500 mg total) by mouth 2 (two) times daily. 04/17/22   Rising, Wells Guiles, PA-C  pregabalin (LYRICA) 50 MG capsule pregabalin 50 mg capsule  TAKE 1 CAPSULE BY MOUTH ONCE DAILY IN THE EVENING FOR 1 WEEK THEN INCREASE TO 1 CAPSULE TWICE DAILY    [provider]  Semaglutide,0.25 or 0.5MG/DOS, 2 MG/1.5ML SOPN Inject 0.375 mLs (0.5 mg total) into the skin once a week. 05/26/20   Harvie Heck, MD      Allergies    Patient has no known allergies.    Review of Systems   Review of Systems  Physical Exam Updated Vital Signs BP (!) 144/94   Pulse 81   Temp 97.9 F (36.6 C) (Oral)   Resp (!) 21   Ht 1.549 m (_0 )   Wt 95 kg   LMP 03/04/2014   SpO2 100%   BMI 39.57 kg/m  Physical Exam Vitals and nursing note reviewed.  Constitutional:      General: She is not in acute distress.    Appearance: She is obese. She is not ill-appearing.  HENT:     Head: Normocephalic.     Right Ear: External ear normal.     Left Ear: External ear normal.     Nose: Nose normal.     Mouth/Throat:     Mouth: Mucous membranes are moist.     Pharynx: Oropharynx  is clear.  Eyes:     Pupils: Pupils are equal, round, and reactive to light.  Cardiovascular:     Rate and Rhythm: Normal rate and regular rhythm.     Pulses: Normal pulses.  Pulmonary:     Effort: Pulmonary effort is normal.     Breath sounds: Normal breath sounds.  Abdominal:     General: Abdomen is flat. Bowel sounds are normal.     Palpations: Abdomen is soft.  Musculoskeletal:        General: Normal range of motion.     Cervical back: Normal range of motion.  Skin:    General: Skin is warm and dry.     Capillary Refill: Capillary refill takes less than 2 seconds.  Neurological:     General: No focal deficit present.     Mental Status: She is alert.  Psychiatric:        Mood and Affect: Mood normal.     ED Results / Procedures /  Treatments   Labs (all labs ordered are listed, but only abnormal results are displayed) Labs Reviewed  BASIC METABOLIC PANEL - Abnormal; Notable for the following components:      Result Value   Chloride 97 (*)    Glucose, Bld 538 (*)    Creatinine, Ser 1.09 (*)    GFR, Estimated 59 (*)    All other components within normal limits  URINALYSIS, ROUTINE W REFLEX MICROSCOPIC - Abnormal; Notable for the following components:   Color, Urine COLORLESS (*)    Glucose, UA >1,000 (*)    All other components within normal limits  CBG MONITORING, ED - Abnormal; Notable for the following components:   Glucose-Capillary 507 (*)    All other components within normal limits  CBC    EKG English  Radiology No results found.  Procedures Procedures  {Document cardiac monitor, telemetry assessment procedure when appropriate:1}  Medications Ordered in ED Medications  insulin aspart (novoLOG) injection 10 Units (has no administration in time range)  lactated ringers bolus 1,000 mL (1,000 mLs Intravenous New Bag/Given 07/02/22 2010)    ED Course/ Medical Decision Making/ A&P Clinical Course as of 07/02/22 2055  Sun Jul 02, 2022  2054 CBC reviewed interpreted within normal limits [DR]  5621 Patient metabolic panel is reviewed and interpreted and noted to have glucose of 538 without any evidence of DKA Creatinine is slightly elevated at 1.09 [DR]  2054 Urinalysis, Routine w reflex microscopic Urine, Clean Catch(!) Urinalysis is significant for glucose greater than thousand consistent with the patient's hyperglycemia [DR]    Clinical Course User Index [DR] Pattricia Boss, MD                           Medical Decision Making 56 year old female with type 2 diabetes with noncompliance with medication presents with hyperglycemia. Patient treated here in the ED with IV fluids and 10 units of insulin. Patient appears to have hyperglycemia secondary to noncompliance with medications.  No evidence of  acute infection or other acute instigating event is noted on my exam or history Patient advised regarding need for compliance or change in medication. She is advised to follow-up with her doctor tomorrow  Amount and/or Complexity of Data Reviewed Labs: ordered.  Risk Prescription drug management.   ***  {Document critical care time when appropriate:1} {Document review of labs and clinical decision tools ie heart score, Chads2Vasc2 etc:1}  {Document your independent review of radiology images, and  any outside records:1} {Document your discussion with family members, caretakers, and with consultants:1} {Document social determinants of health affecting pt's care:1} {Document your decision making why or why not admission, treatments were needed:1} Final Clinical Impression(s) / ED Diagnoses Final diagnoses:  English    Rx / DC Orders ED Discharge Orders     English

## 2022-07-05 ENCOUNTER — Ambulatory Visit: Payer: PRIVATE HEALTH INSURANCE | Admitting: Physician Assistant

## 2022-07-05 ENCOUNTER — Encounter: Payer: PRIVATE HEALTH INSURANCE | Admitting: Internal Medicine

## 2022-07-11 NOTE — Progress Notes (Unsigned)
   CC: ***  HPI:   Crystal English is a 56 y.o. ***  LAST SEEN 07-2020  ED VISIT 8-13 HYPERGLYCEMIA Takes metformin but not semaglutide Polyuria, polydypsia Gluc 538, also found to have high BP Gave LR and Insulin   DIABETES Last A1C 7.5 in 05-2020 Taking metformin but not semaglutide TODAY IN CLINIC . . .   HYPERTENSION BP in ED 382-505L systolic TODAY IN CLINIC . Marland Kitchen Marland Kitchen   ALSO LIPID PANEL 2015 LDL WAS 102     Past Medical History:  Diagnosis Date   Agatston coronary artery calcium score less than 100    6.3 coronary Ca score 11/2021 CT   Allergic rhinitis    Anxiety    Asthma    Required inhaled corticosteroid in past.    Benign paroxysmal positional vertigo, unspecified laterality    Body mass index (BMI) 40.0-44.9, adult (HCC)    Cervical radiculopathy    Daily headache    Depression, major 02/25/2013   First episode that required was 2014. Prior episodes that did not require pt to seek medical tx.   DM (diabetes mellitus) ([place])    Fibromyalgia    Hyperlipidemia    Hypertension    2 drug therapy   Low back pain 08/10/2016   Morbid obesity due to excess calories (HCC)    Nausea 09/19/2018   OSA (obstructive sleep apnea)    Sleep study 12/2006 : Moderately Severe OSA. AHI 38.8. O2 sat decreased to 71%.   Primary insomnia    Severe obesity (BMI >= 40) (HCC) 10/08/2012   Obesity Class 3. BMI > 40.    Strain of flexor muscle of right hip 09/19/2018   Tobacco abuse    Urge incontinence      Review of Systems:    Reports *** Denies *** (subjective fever?, pain anywhere?, bowel changes?)   Physical Exam:  There were no vitals filed for this visit.  General:   awake and alert, sitting comfortably in chair, cooperative, not in acute distress Skin:   warm and dry, intact without any obvious lesions or scars, no rashes or lesions  Head:   normocephalic and atraumatic, oral mucosa moist with good dentition, no lymphadenopathy Eyes:   extraocular  movements intact, conjunctivae pink, pupils round and reactive to light, no periorbital swelling or scleral icterus Ears:   pinnae normal, no discharge or external lesions  Nose:   symmetrical and without mucosal inflammation, no external lesions or discharge Lungs:   normal respiratory effort, breathing unlabored, symmetrical chest rise, no crackles or wheezing Cardiac:   regular rate and rhythm, normal S1 and S2, capillary refill 2-3 seconds, dorsalis pedis pulses intact bilaterally, no pitting edema Abdomen:   soft and non-distended, normoactive bowel sounds present in all four quadrants, no guarding or palpable masses Musculoskeletal:   full range of motion in joints, motor strength 5 /5 in all four extremities, no obvious deformities or joint tenderness Neurologic:   oriented to person-place-time, moving all extremities, sensation to light touch intact, no facial droop Psychiatric:   mood and affect normal, intelligible speech    Assessment & Plan:   No problem-specific Assessment & Plan notes found for this encounter.     See Encounters Tab for problem based charting.  Patient {GC/GE:[id number]::"discussed with","seen with"} Dr. {NAMES:[id number]::"Guilloud","Hoffman","Mullen","Narendra","Williams","Vincent"}

## 2022-07-12 ENCOUNTER — Other Ambulatory Visit (HOSPITAL_COMMUNITY): Payer: Self-pay

## 2022-07-12 ENCOUNTER — Ambulatory Visit (INDEPENDENT_AMBULATORY_CARE_PROVIDER_SITE_OTHER): Payer: PRIVATE HEALTH INSURANCE | Admitting: Internal Medicine

## 2022-07-12 VITALS — BP 141/95 | HR 71 | Temp 98.1°F | Ht 61.0 in | Wt 208.7 lb

## 2022-07-12 DIAGNOSIS — E119 Type 2 diabetes mellitus without complications: Secondary | ICD-10-CM

## 2022-07-12 DIAGNOSIS — I10 Essential (primary) hypertension: Secondary | ICD-10-CM | POA: Diagnosis not present

## 2022-07-12 DIAGNOSIS — Z7984 Long term (current) use of oral hypoglycemic drugs: Secondary | ICD-10-CM | POA: Diagnosis not present

## 2022-07-12 DIAGNOSIS — Z87891 Personal history of nicotine dependence: Secondary | ICD-10-CM | POA: Diagnosis not present

## 2022-07-12 DIAGNOSIS — Z7985 Long-term (current) use of injectable non-insulin antidiabetic drugs: Secondary | ICD-10-CM

## 2022-07-12 LAB — POCT GLYCOSYLATED HEMOGLOBIN (HGB A1C): Hemoglobin A1C: 13.1 % — AB (ref 4.0–5.6)

## 2022-07-12 LAB — GLUCOSE, CAPILLARY: Glucose-Capillary: 176 mg/dL — ABNORMAL HIGH (ref 70–99)

## 2022-07-12 MED ORDER — LISINOPRIL-HYDROCHLOROTHIAZIDE 20-25 MG PO TABS
1.0000 | ORAL_TABLET | Freq: Every day | ORAL | 0 refills | Status: DC
  Filled 2022-07-12: qty 30, 30d supply, fill #0

## 2022-07-12 MED ORDER — SEMAGLUTIDE(0.25 OR 0.5MG/DOS) 2 MG/3ML ~~LOC~~ SOPN
0.2500 mg | PEN_INJECTOR | SUBCUTANEOUS | 0 refills | Status: DC
  Filled 2022-07-12 – 2022-09-14 (×2): qty 3, fill #0

## 2022-07-12 MED ORDER — SEMAGLUTIDE(0.25 OR 0.5MG/DOS) 2 MG/3ML ~~LOC~~ SOPN
0.5000 mg | PEN_INJECTOR | SUBCUTANEOUS | 0 refills | Status: DC
  Filled 2022-07-12: qty 3, 28d supply, fill #0
  Filled 2022-07-12: qty 1.5, fill #0

## 2022-07-12 MED ORDER — METFORMIN HCL ER 500 MG PO TB24
1000.0000 mg | ORAL_TABLET | Freq: Two times a day (BID) | ORAL | 0 refills | Status: DC
  Filled 2022-07-12: qty 120, 30d supply, fill #0

## 2022-07-12 MED ORDER — SEMAGLUTIDE(0.25 OR 0.5MG/DOS) 2 MG/3ML ~~LOC~~ SOPN
0.2500 mg | PEN_INJECTOR | SUBCUTANEOUS | 0 refills | Status: DC
  Filled 2022-07-12: qty 3, 56d supply, fill #0
  Filled 2022-07-12: qty 1, 35d supply, fill #0

## 2022-07-12 MED ORDER — EMPAGLIFLOZIN 10 MG PO TABS
10.0000 mg | ORAL_TABLET | Freq: Every day | ORAL | 0 refills | Status: DC
  Filled 2022-07-12: qty 30, 30d supply, fill #0

## 2022-07-12 NOTE — Patient Instructions (Signed)
Thank you, Ms.Orlene Och for allowing [place] to provide your care today. Today we discussed:  Diabetes: Your a1c is very high today (13) and your goal is to get it down to 7.  Take metformin 1000 mg twice a day (2 tablets twice a day). Start Ozempic again: 0.25 mg injection per week. After this runs out, you can start the 0.5 mg injection per week dose. Start Jardiance 10 mg daily. We will likely increase this medication at your next visit, but we just want to start you on the low dose for now  High blood pressure: Keep taking one tablet of Zestoretic each day. We may need to increase this in the future, but are not making any changes today  I have ordered the following labs for you:   Lab Orders         Glucose, capillary         POC Hbg A1C       Referrals ordered today:    Referral Orders         Amb Referral To Provider Referral Exercise Program (P.R.E.P)      I have ordered the following medication/changed the following medications:   Stop the following medications: Medications Discontinued During This Encounter  Medication Reason   Semaglutide,0.25 or 0.'5MG'$ /DOS, 2 MG/1.5ML SOPN Reorder   lisinopril-hydrochlorothiazide (ZESTORETIC) 20-25 MG tablet Reorder   metFORMIN (GLUCOPHAGE-XR) 500 MG 24 hr tablet Reorder     Start the following medications: Meds ordered this encounter  Medications   Semaglutide,0.25 or 0.'5MG'$ /DOS, 2 MG/1.5ML SOPN    Sig: Inject 0.25 mg into the skin once a week.    Dispense:  1 mL    Refill:  0   Semaglutide,0.25 or 0.'5MG'$ /DOS, 2 MG/3ML SOPN    Sig: Inject 0.5 mg into the skin once a week.    Dispense:  1.5 mL    Refill:  0   metFORMIN (GLUCOPHAGE-XR) 500 MG 24 hr tablet    Sig: Take 2 tablets (1,000 mg total) by mouth 2 (two) times daily.    Dispense:  120 tablet    Refill:  0   empagliflozin (JARDIANCE) 10 MG TABS tablet    Sig: Take 1 tablet (10 mg total) by mouth daily.    Dispense:  30 tablet    Refill:  0    lisinopril-hydrochlorothiazide (ZESTORETIC) 20-25 MG tablet    Sig: Take 1 tablet by mouth daily.    Dispense:  30 tablet    Refill:  0     Follow up:  4 weeks  for blood pressure and diabetes    Should you have any questions or concerns please call the internal medicine clinic at [phone].     Buddy Duty, D.O. [place]

## 2022-07-12 NOTE — Assessment & Plan Note (Addendum)
The patient has not been seen in our clinic in over 2 years and at that time, her A1c was 7.5%. A few days ago, the patient was seen in the emergency department as she was having increased urination and increased thirst, and she suspected that her blood sugar was high.  Her glucose level at that time was >500 and the patient was given insulin to bring this down. She presents today to follow up on her diabetes.   Today, the patient's A1c is 13.1%.  She has only been taking metformin 500 mg twice daily, as she has been out of her Ozempic for months.  We discussed the complications of diabetes, especially at A1c levels >10 , and the patient states that she "wants to get back on the wagon".  She is adamant that she does not want to take insulin and she is even hesitant to start other oral medications at this time, however, we discussed the necessity of this.  The patient also would really like to work on her diet and exercise, which I think is a great idea, in addition to medication management.  Plan: - Increase metformin to 1000 mg twice daily - Start Ozempic 0.25 mg/week followed by 0.5 mg/week - Start Jardiance 10 mg daily (only sent in 30d supply; likely will increase to 25 mg dose at next visit) - Provider referral exercise program - Given information on diabetic diet - Follow up in 4 weeks

## 2022-07-12 NOTE — Assessment & Plan Note (Signed)
Blood pressure is elevated to 141/95 today.  She takes lisinopril-HCTZ 20-25 mg daily.  Patient denies any headaches, dizziness, lightheadedness, chest pain, palpitations, or shortness of breath.  Patient we did discuss increasing the dose of her medication to 40-25 mg daily, but with all the changes we are making to her diabetes regimen, we will hold off on this at this time.  Plan: - Continue lisinopril-HCTZ 20-25 mg daily; likely will increase to 40-25 mg daily at next visit

## 2022-07-12 NOTE — Addendum Note (Signed)
Addended by: Buddy Duty on: 07/12/2022 05:06 PM   Modules accepted: Orders

## 2022-07-12 NOTE — Progress Notes (Signed)
CC: re-establish  HPI:  [name] is a 56 y.o. female living with a history stated below and presents today to re-establish care (has not been seen in 2 years). Please see problem based assessment and plan for additional details.  PMHx: diabetes, HTN  Family Hx: cardiovascular disease  Social Hx: no tobacco, no alcohol, no other substance use. Works at a Engineer, petroleum at [employer].  Past Medical History:  Diagnosis Date   Agatston coronary artery calcium score less than 100    6.3 coronary Ca score 11/2021 CT   Allergic rhinitis    Anxiety    Asthma    Required inhaled corticosteroid in past.    Benign paroxysmal positional vertigo, unspecified laterality    Body mass index (BMI) 40.0-44.9, adult (HCC)    Cervical radiculopathy    Daily headache    Depression, major 02/25/2013   First episode that required was 2014. Prior episodes that did not require pt to seek medical tx.   DM (diabetes mellitus) ([place])    Fibromyalgia    Hyperlipidemia    Hypertension    2 drug therapy   Low back pain 08/10/2016   Morbid obesity due to excess calories (HCC)    Nausea 09/19/2018   OSA (obstructive sleep apnea)    Sleep study 12/2006 : Moderately Severe OSA. AHI 38.8. O2 sat decreased to 71%.   Primary insomnia    Severe obesity (BMI >= 40) (HCC) 10/08/2012   Obesity Class 3. BMI > 40.    Strain of flexor muscle of right hip 09/19/2018   Tobacco abuse    Urge incontinence     Current Outpatient Medications on File Prior to Visit  Medication Sig Dispense Refill   atorvastatin (LIPITOR) 10 MG tablet atorvastatin 10 mg tablet  TAKE 1 TABLET BY MOUTH ONCE DAILY     Blood Glucose Monitoring Suppl (ONETOUCH VERIO) w/Device KIT PLEASE CHECK BLOOD SUGAR 3 TIMES DAILY 1 kit 0   glucose blood test strip Please check blood sugars 3 times daily 100 each 12   hydrOXYzine (ATARAX) 25 MG tablet hydroxyzine HCl 25 mg tablet  TAKE 1 TABLET BY MOUTH ONCE DAILY AT  BEDTIME AS NEEDED     Lancets (ONETOUCH ULTRASOFT) lancets Please check blood sugars 3 times daily 100 each 12   lidocaine (LIDODERM) 5 % Place 1 patch onto the skin daily. Remove & Discard patch within 12 hours or as directed by MD 30 patch 0   methocarbamol (ROBAXIN) 500 MG tablet Take 1 tablet (500 mg total) by mouth 2 (two) times daily. 14 tablet 0   pregabalin (LYRICA) 50 MG capsule pregabalin 50 mg capsule  TAKE 1 CAPSULE BY MOUTH ONCE DAILY IN THE EVENING FOR 1 WEEK THEN INCREASE TO 1 CAPSULE TWICE DAILY     No current facility-administered medications on file prior to visit.    Family History  Problem Relation Age of Onset   Diabetes Mother    Hypertension Mother    Arthritis Mother    Hyperlipidemia Mother    Hypertension Father    Stroke Father        in his 82's   Heart disease Father        died <47 y.o   Heart disease Sister    Heart disease Brother    Heart disease Maternal Grandmother    Diabetes Maternal Aunt    Cancer Maternal Aunt        breast    Breast  cancer Maternal Aunt 60   Asthma Maternal Aunt     Social History   Socioeconomic History   Marital status: Single    Spouse name: Not on file   Number of children: 3   Years of education: some coll.   Highest education level: Not on file  Occupational History   Occupation: Claims processing    Employer: [employer]  Tobacco Use   Smoking status: Former    Packs/day: 0.03    Years: 30.00    Total pack years: 0.90    Types: Cigarettes    Quit date: 05/21/2015    Years since quitting: 7.1    Passive exposure: Never   Smokeless tobacco: Never  Vaping Use   Vaping Use: Never used  Substance and Sexual Activity   Alcohol use: Not Currently    Alcohol/week: 0.0 standard drinks of alcohol    Comment: rarely.   Drug use: No   Sexual activity: Not on file  Other Topics Concern   Not on file  Social History Narrative   Lives in [place] alone. 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    Social Determinants of Health   Financial Resource Strain: Not on file  Food Insecurity: Not on file  Transportation Needs: Not on file  Physical Activity: Not on file  Stress: Not on file  Social Connections: Not on file  Intimate Partner Violence: Not on file    Review of Systems: ROS negative except for what is noted on the assessment and plan.  Vitals:   07/12/22 1525 07/12/22 1553  BP: (!) 147/105 (!) 141/95  Pulse: 70 71  Temp: 98.1 F (36.7 C)   TempSrc: Oral   SpO2: 100%   Weight: 208 lb 11.2 oz (94.7 kg)   Height: 5' 1"  (1.549 m)     Physical Exam: Constitutional: well-appearing obese female sitting in chair, in no acute distress Cardiovascular: regular rate and rhythm, no m/r/g Pulmonary/Chest: normal work of breathing on room air, lungs clear to auscultation bilaterally Abdominal: soft, non-tender, non-distended MSK: normal bulk and tone Neurological: alert & oriented x 3, no focal deficit Skin: warm and dry Psych: normal mood and behavior  Assessment & Plan:     Patient discussed with Dr. Daryll Drown  Essential hypertension Blood pressure is elevated to 141/95 today.  She takes lisinopril-HCTZ 20-25 mg daily.  Patient denies any headaches, dizziness, lightheadedness, chest pain, palpitations, or shortness of breath.  Patient we did discuss increasing the dose of her medication to 40-25 mg daily, but with all the changes we are making to her diabetes regimen, we will hold off on this at this time.  Plan: - Continue lisinopril-HCTZ 20-25 mg daily; likely will increase to 40-25 mg daily at next visit  Diabetes mellitus [facility]) The patient has not been seen in our clinic in over 2 years and at that time, her A1c was 7.5%. A few days ago, the patient was seen in the emergency department as she was having increased urination and increased thirst, and she suspected that her blood sugar was high.  Her glucose level at that time was >500 and the patient was given  insulin to bring this down. She presents today to follow up on her diabetes.   Today, the patient's A1c is 13.1%.  She has only been taking metformin 500 mg twice daily, as she has been out of her Ozempic for months.  We discussed the complications of diabetes, especially at A1c levels >10 ,  and the patient states that she "wants to get back on the wagon".  She is adamant that she does not want to take insulin and she is even hesitant to start other oral medications at this time, however, we discussed the necessity of this.  The patient also would really like to work on her diet and exercise, which I think is a great idea, in addition to medication management.  Plan: - Increase metformin to 1000 mg twice daily - Start Ozempic 0.25 mg/week followed by 0.5 mg/week - Start Jardiance 10 mg daily (only sent in 30d supply; likely will increase to 25 mg dose at next visit) - Provider referral exercise program - Given information on diabetic diet - Follow up in 4 weeks   Euleta Belson, D.O. [facility], PGY-2 Phone: [phone] Date 07/12/2022 Time 4:48 PM

## 2022-07-13 ENCOUNTER — Telehealth: Payer: Self-pay

## 2022-07-13 NOTE — Telephone Encounter (Signed)
Call to pt reference PREP referral Explained program to her  Interested in participating Needs evening class Can do 6p-715p on Tuesday and Thursdays at [facility] Next class start will be 09/05/22 Will contact her closer to start of class to do intake

## 2022-07-19 ENCOUNTER — Other Ambulatory Visit (HOSPITAL_COMMUNITY): Payer: Self-pay

## 2022-07-19 NOTE — Progress Notes (Signed)
Internal Medicine Clinic Attending ° °Case discussed with Dr. Atway  at the time of the visit.  We reviewed the resident’s history and exam and pertinent patient test results.  I agree with the assessment, diagnosis, and plan of care documented in the resident’s note.  °

## 2022-08-15 ENCOUNTER — Ambulatory Visit: Payer: PRIVATE HEALTH INSURANCE | Admitting: Physician Assistant

## 2022-08-17 ENCOUNTER — Ambulatory Visit (INDEPENDENT_AMBULATORY_CARE_PROVIDER_SITE_OTHER): Payer: PRIVATE HEALTH INSURANCE

## 2022-08-17 ENCOUNTER — Other Ambulatory Visit: Payer: Self-pay

## 2022-08-17 VITALS — BP 133/87 | HR 92 | Temp 98.1°F | Ht 61.0 in | Wt 199.2 lb

## 2022-08-17 DIAGNOSIS — E119 Type 2 diabetes mellitus without complications: Secondary | ICD-10-CM

## 2022-08-17 DIAGNOSIS — Z87891 Personal history of nicotine dependence: Secondary | ICD-10-CM | POA: Diagnosis not present

## 2022-08-17 DIAGNOSIS — E7849 Other hyperlipidemia: Secondary | ICD-10-CM | POA: Diagnosis not present

## 2022-08-17 DIAGNOSIS — E785 Hyperlipidemia, unspecified: Secondary | ICD-10-CM | POA: Insufficient documentation

## 2022-08-17 DIAGNOSIS — I10 Essential (primary) hypertension: Secondary | ICD-10-CM

## 2022-08-17 DIAGNOSIS — Z0000 Encounter for general adult medical examination without abnormal findings: Secondary | ICD-10-CM

## 2022-08-17 DIAGNOSIS — Z139 Encounter for screening, unspecified: Secondary | ICD-10-CM

## 2022-08-17 DIAGNOSIS — Z7984 Long term (current) use of oral hypoglycemic drugs: Secondary | ICD-10-CM

## 2022-08-17 MED ORDER — EMPAGLIFLOZIN 10 MG PO TABS: 10.0000 mg | ORAL_TABLET | Freq: Every day | ORAL | 1 refills | Status: DC

## 2022-08-17 MED ORDER — LISINOPRIL-HYDROCHLOROTHIAZIDE 20-25 MG PO TABS: 1.0000 | ORAL_TABLET | Freq: Every day | ORAL | 1 refills | Status: DC

## 2022-08-17 MED ORDER — GLUCOSE BLOOD VI STRP: ORAL_STRIP | 1 refills | Status: DC

## 2022-08-17 MED ORDER — METFORMIN HCL ER 500 MG PO TB24: 1000.0000 mg | ORAL_TABLET | Freq: Two times a day (BID) | ORAL | 1 refills | Status: DC

## 2022-08-17 NOTE — Patient Instructions (Addendum)
Thank you for coming to see Crystal English in clinic Crystal English.   Plan: - We will continue your metformin at the same dose 500 mg (x4 daily) - We will continue your jardiance at the same dose 10 mg - We got a urine sample to measure your kidney function (I will call you with these results) - Please have your eyes checked at your next ophthalmology visit - We will continue your lisinopril-hydrochlorothiazide at the same dose 20-25 mg - We recommend that you check your blood pressure at home - We got blood work to check your cholesterol levels - I will refill your metformin, jardiance, test strips, lisinopril-hydrochlorothiazide  - I will put in a referral for your colonoscopy once you are able to have that done - Please have the results of your mammogram faxed to the [facility]  It was very nice to meet you.

## 2022-08-17 NOTE — Assessment & Plan Note (Signed)
Patient would not like the influenza vaccine today. Patient states that she had a mammogram done last year and will have the records faxed to our clinic. She is comfortable with referral for colonoscopy as long as her new insurance will cover it.  Plan: - Referral for colonoscopy

## 2022-08-17 NOTE — Assessment & Plan Note (Signed)
Current medications include lisinopril-HCTZ 20-25 mg. Does not check her blood pressure at home. BP 133/87 in clinic today.  Plan: - Continue lisinopril-HCTZ 20-25 mg

## 2022-08-17 NOTE — Progress Notes (Signed)
CC: F/u for T2DM  HPI:  [name] is a 56 y.o. female with past medical history of HTN, HLD, T2DM, OSA, migraine, and asthma that presents for 1 month follow up of her DM.   Patient has been feeling well since her last visit and remains motivated with respect to taking medications for her diabetes.  She denies polyuria, polydipsia, fatigue.  Patient states that she has been taking her metformin and empagliflozin as prescribed.  She states that she has been trying to eat well and exercise is much as possible.  She measures her blood glucose several times per day with most readings in the 100s over the last month with a couple values in the low 200s.  Patient states that she follows up with ophthalmology regularly and will have an eye exam at her next appointment.  Patient states that she has been taking her lisinopril hydrochlorothiazide as prescribed.  She does not check her blood pressure at home.  Patient states that she is not taking atorvastatin at home. She states that she remembers taking this medication in the past but is unsure of how long it has been since she last took it. She is unsure on whether or not she should be taking this medication.  Patient states that she had a mammogram done last year and will have the records faxed to our clinic.  She is comfortable with an outpatient referral for colonoscopy as long as her new insurance will cover it.  Past Medical History:  Diagnosis Date   Agatston coronary artery calcium score less than 100    6.3 coronary Ca score 11/2021 CT   Allergic rhinitis    Anxiety    Asthma    Required inhaled corticosteroid in past.    Benign paroxysmal positional vertigo, unspecified laterality    Body mass index (BMI) 40.0-44.9, adult (HCC)    Cervical radiculopathy    Daily headache    Depression, major 02/25/2013   First episode that required was 2014. Prior episodes that did not require pt to seek medical tx.   DM (diabetes  mellitus) ([place])    Fibromyalgia    Hyperlipidemia    Hypertension    2 drug therapy   Low back pain 08/10/2016   Morbid obesity due to excess calories (HCC)    Nausea 09/19/2018   OSA (obstructive sleep apnea)    Sleep study 12/2006 : Moderately Severe OSA. AHI 38.8. O2 sat decreased to 71%.   Primary insomnia    Severe obesity (BMI >= 40) (HCC) 10/08/2012   Obesity Class 3. BMI > 40.    Strain of flexor muscle of right hip 09/19/2018   Tobacco abuse    Urge incontinence    Review of Systems:  per HPI.   Physical Exam: Vitals:   08/17/22 1530  BP: 133/87  Pulse: 92  Temp: 98.1 F (36.7 C)  TempSrc: Oral  SpO2: 100%  Weight: 199 lb 3.2 oz (90.4 kg)  Height: '5\' 1"'$  (1.549 m)   Constitutional: appears comfortable  Cardiovascular: Regular rate, regular rhythm. No murmurs, rubs, or gallops. Normal radial and PT pulses bilaterally. No LE edema.  Pulmonary: Normal respiratory effort. No wheezes, rales, or rhonchi.   Abdominal: Normal bowel sounds.  Musculoskeletal: Normal range of motion. Neurological: Alert and oriented to person, place, and time. Non-focal. Normal diabetic foot exam today.  Skin: warm and dry.    Assessment & Plan:   See Encounters Tab for problem based charting.  Patient  seen with Dr. Evette Doffing

## 2022-08-17 NOTE — Assessment & Plan Note (Signed)
Patient remains motivated to get her diabetes under control. She denies polyuria, polydipsia, fatigue. She states that she has been taking her metformin and empagliflozin as prescribed. She states that she has been trying to eat well and exercise. Her glucose monitor demonstrates most readings in the 100s over the last month with a couple values in the low 200s. A1c 13.1 in 06/2022. Current medications include empagliflozin 10 mg and metformin 500 mg. There was confusion about starting semaglutide at her last visit in 06/2022. Given that her blood sugar has been well controlled at home and that the patient would not like to start semaglutide we will hold off on this medication for now. Normal diabetic foot exam today.   Plan: - Continue empagliflozin 10 mg and metformin 500 mg - Urine ACR today - Patient will follow up with her ophthalmologist for her yearly eye exam - Discuss CGM at next visit - A1c at next visit in 2 months

## 2022-08-17 NOTE — Assessment & Plan Note (Signed)
Atorvastatin 10 mg listed in the patients chart. Patient states that she is not taking atorvastatin at home. She states that she remembers taking this medication in the past but is unsure of how long it has been since she last took it. She is unsure on whether or not she should be taking this medication. Last lipid panel in chart from 8 years ago.  Plan: - Lipid panel today - Reassess need for statin therapy at next visit in 2 months

## 2022-08-18 ENCOUNTER — Telehealth: Payer: Self-pay

## 2022-08-18 ENCOUNTER — Other Ambulatory Visit (HOSPITAL_COMMUNITY): Payer: Self-pay

## 2022-08-18 ENCOUNTER — Other Ambulatory Visit: Payer: Self-pay

## 2022-08-18 DIAGNOSIS — I10 Essential (primary) hypertension: Secondary | ICD-10-CM

## 2022-08-18 DIAGNOSIS — E119 Type 2 diabetes mellitus without complications: Secondary | ICD-10-CM

## 2022-08-18 LAB — MICROALBUMIN / CREATININE URINE RATIO
Creatinine, Urine: 67.8 mg/dL
Microalb/Creat Ratio: <4 mg/g creat (ref 0–29)
Microalbumin, Urine: <3 ug/mL

## 2022-08-18 LAB — LIPID PANEL
Chol/HDL Ratio: 3.6 ratio (ref 0.0–4.4)
Cholesterol, Total: 207 mg/dL — ABNORMAL HIGH (ref 100–199)
HDL: 58 mg/dL (ref >39)
LDL Chol Calc (NIH): 130 mg/dL — ABNORMAL HIGH (ref 0–99)
Triglycerides: 104 mg/dL (ref 0–149)
VLDL Cholesterol Cal: 19 mg/dL (ref 5–40)

## 2022-08-18 MED ORDER — EMPAGLIFLOZIN 10 MG PO TABS
10.0000 mg | ORAL_TABLET | Freq: Every day | ORAL | 2 refills | Status: DC
  Filled 2022-08-18: qty 30, 30d supply, fill #0
  Filled 2022-09-14: qty 30, 30d supply, fill #1
  Filled 2022-10-16: qty 30, 30d supply, fill #2

## 2022-08-18 MED ORDER — LISINOPRIL-HYDROCHLOROTHIAZIDE 20-25 MG PO TABS: 1.0000 | ORAL_TABLET | Freq: Every day | ORAL | 2 refills | Status: DC

## 2022-08-18 MED ORDER — EMPAGLIFLOZIN 10 MG PO TABS: 10.0000 mg | ORAL_TABLET | Freq: Every day | ORAL | 2 refills | Status: DC

## 2022-08-18 MED ORDER — GLUCOSE BLOOD VI STRP: ORAL_STRIP | 1 refills | Status: DC

## 2022-08-18 MED ORDER — GLUCOSE BLOOD VI STRP
ORAL_STRIP | 1 refills | Status: AC
  Filled 2022-08-18: qty 100, 33d supply, fill #0
  Filled 2022-09-14: qty 100, fill #0
  Filled 2022-09-14: qty 100, 30d supply, fill #0

## 2022-08-18 MED ORDER — ATORVASTATIN CALCIUM 10 MG PO TABS: ORAL_TABLET | ORAL | 1 refills | Status: DC

## 2022-08-18 MED ORDER — METFORMIN HCL ER 500 MG PO TB24: 1000.0000 mg | ORAL_TABLET | Freq: Two times a day (BID) | ORAL | 2 refills | Status: DC

## 2022-08-18 MED ORDER — METFORMIN HCL ER 500 MG PO TB24
1000.0000 mg | ORAL_TABLET | Freq: Two times a day (BID) | ORAL | 2 refills | Status: DC
  Filled 2022-08-18: qty 120, 30d supply, fill #0
  Filled 2022-09-14 (×2): qty 120, 30d supply, fill #1
  Filled 2022-10-16: qty 120, 30d supply, fill #2

## 2022-08-18 MED ORDER — LISINOPRIL-HYDROCHLOROTHIAZIDE 20-25 MG PO TABS
1.0000 | ORAL_TABLET | Freq: Every day | ORAL | 2 refills | Status: DC
  Filled 2022-08-18: qty 30, 30d supply, fill #0
  Filled 2022-09-14 (×2): qty 30, 30d supply, fill #1
  Filled 2022-10-16: qty 30, 30d supply, fill #2

## 2022-08-18 NOTE — Telephone Encounter (Signed)
Called to inquire about 1pm class for PREP starting on 09/05/22. Pt sts she will need evening class--which is also starting on the 17th.  Will call her when I am scheduling intakes for that class.

## 2022-08-18 NOTE — Telephone Encounter (Signed)
Requesting to speak with a nurse about meds. Please call pt back.  

## 2022-08-18 NOTE — Progress Notes (Signed)
Internal Medicine Clinic Attending  I saw and evaluated the patient.  I personally confirmed the key portions of the history and exam documented by Dr. Mapp and I reviewed pertinent patient test results.  The assessment, diagnosis, and plan were formulated together and I agree with the documentation in the resident's note.  

## 2022-08-18 NOTE — Progress Notes (Signed)
Called patient and discussed normal results of urine ACR.

## 2022-08-21 ENCOUNTER — Other Ambulatory Visit (HOSPITAL_COMMUNITY): Payer: Self-pay

## 2022-08-21 ENCOUNTER — Other Ambulatory Visit: Payer: Self-pay | Admitting: Student

## 2022-08-21 DIAGNOSIS — E7849 Other hyperlipidemia: Secondary | ICD-10-CM

## 2022-08-21 MED ORDER — ATORVASTATIN CALCIUM 10 MG PO TABS
ORAL_TABLET | ORAL | 1 refills | Status: DC
  Filled 2022-08-21: qty 90, 90d supply, fill #0

## 2022-08-21 MED ORDER — ATORVASTATIN CALCIUM 10 MG PO TABS
10.0000 mg | ORAL_TABLET | Freq: Every day | ORAL | 1 refills | Status: DC
  Filled 2022-08-21: qty 30, 30d supply, fill #0
  Filled 2022-10-16: qty 30, 30d supply, fill #1
  Filled 2022-11-22: qty 30, 30d supply, fill #2
  Filled 2022-12-28: qty 30, 30d supply, fill #3
  Filled 2023-02-01: qty 30, 30d supply, fill #4
  Filled 2023-02-27: qty 30, 30d supply, fill #5

## 2022-08-25 ENCOUNTER — Other Ambulatory Visit (HOSPITAL_COMMUNITY): Payer: Self-pay

## 2022-08-25 ENCOUNTER — Telehealth: Payer: Self-pay

## 2022-08-25 NOTE — Telephone Encounter (Signed)
Call to pt to confirm PREP starting 09/05/22 6p-715p Intake scheduled for 6pm on 08/31/22 at [facility].  Will meet her at front desk

## 2022-09-14 ENCOUNTER — Other Ambulatory Visit (HOSPITAL_COMMUNITY): Payer: Self-pay

## 2022-09-15 ENCOUNTER — Other Ambulatory Visit (HOSPITAL_COMMUNITY): Payer: Self-pay

## 2022-09-15 MED ORDER — TRUE METRIX METER W/DEVICE KIT
PACK | 0 refills | Status: AC
  Filled 2022-09-15: qty 1, 1d supply, fill #0

## 2022-09-15 MED ORDER — TRUEPLUS LANCETS 30G MISC
Freq: Three times a day (TID) | 0 refills | Status: AC
  Filled 2022-09-15: qty 100, 30d supply, fill #0

## 2022-09-15 NOTE — Progress Notes (Signed)
[facility] PREP Weekly Session  Patient Details  Name: Crystal English MRN: [id number] Date of Birth: Oct 11, 1966 Age: 56 y.o. PCP: Serita Butcher, MD  Vitals:   09/12/22 1800  Weight: 196 lb 9.6 oz (89.2 kg)     [facility] Weekly seesion - 09/15/22 1400       [facility] "PREP" Location   [facility] "PREP" Location [facility]      Weekly Session   Topic Discussed Importance of resistance training;Other ways to be active    Minutes exercised this week 190 minutes    Classes attended to date [place] 09/15/2022, 2:24 PM

## 2022-09-15 NOTE — Progress Notes (Deleted)
[facility] PREP Weekly Session  Patient Details  Name: Crystal English MRN: [id number] Date of Birth: 1966-09-04 Age: 56 y.o. PCP: Serita Butcher, MD  Vitals:   09/12/22 1800  Weight: 196 lb 9.6 oz (89.2 kg)     [facility] Weekly seesion - 09/15/22 1400       [facility] "PREP" Location   [facility] "PREP" Location [facility]      Weekly Session   Topic Discussed Importance of resistance training;Other ways to be active    Minutes exercised this week 190 minutes    Classes attended to date [place] 09/15/2022, 2:23 PM

## 2022-10-04 NOTE — Progress Notes (Signed)
[facility] PREP Weekly Session  Patient Details  Name: Crystal English MRN: [id number] Date of Birth: 13-May-1966 Age: 56 y.o. PCP: Serita Butcher, MD  There were no vitals filed for this visit.   [facility] Weekly seesion - 10/04/22 1200       [facility] "PREP" Location   [facility] "PREP" [facility]      Weekly Session   Topic Discussed Health habits    Minutes exercised this week 370 minutes    Classes attended to date 5           Late entry  Barnett Hatter 10/04/2022, 12:20 PM

## 2022-10-16 ENCOUNTER — Other Ambulatory Visit (HOSPITAL_COMMUNITY): Payer: Self-pay

## 2022-10-17 ENCOUNTER — Ambulatory Visit (INDEPENDENT_AMBULATORY_CARE_PROVIDER_SITE_OTHER): Payer: 59 | Admitting: Student

## 2022-10-17 VITALS — BP 122/84 | Temp 99.0°F | Ht 61.0 in | Wt 195.2 lb

## 2022-10-17 DIAGNOSIS — I10 Essential (primary) hypertension: Secondary | ICD-10-CM

## 2022-10-17 DIAGNOSIS — E119 Type 2 diabetes mellitus without complications: Secondary | ICD-10-CM

## 2022-10-17 DIAGNOSIS — E7849 Other hyperlipidemia: Secondary | ICD-10-CM

## 2022-10-17 DIAGNOSIS — M5010 Cervical disc disorder with radiculopathy, unspecified cervical region: Secondary | ICD-10-CM

## 2022-10-17 DIAGNOSIS — Z7984 Long term (current) use of oral hypoglycemic drugs: Secondary | ICD-10-CM

## 2022-10-17 DIAGNOSIS — Z87891 Personal history of nicotine dependence: Secondary | ICD-10-CM

## 2022-10-17 LAB — GLUCOSE, CAPILLARY: Glucose-Capillary: 125 mg/dL — ABNORMAL HIGH (ref 70–99)

## 2022-10-17 LAB — POCT GLYCOSYLATED HEMOGLOBIN (HGB A1C): Hemoglobin A1C: 7.9 % — AB (ref 4.0–5.6)

## 2022-10-17 NOTE — Patient Instructions (Signed)
  Thank you, Ms.Orlene Och, for allowing [place] to provide your care today. Today we discussed . . .  > Type 2 diabetes       -Your A1c is 7.9 today which is fantastic.  You have done a very good job at controlling your blood sugar.  We are not can make any changes to your medication and you will continue to take Jardiance 10 mg daily and metformin 1000 mg twice daily.  Please continue to exercise with your daily walks [facility]  If you have any issues with anything please not hesitate to call our office.  Otherwise we will plan to see you again in 3 months to reevaluate. > High cholesterol       -Please continue to take your atorvastatin 10 mg daily and we will plan to recheck your cholesterol again in about 6 months. > Neck pain and finger numbness       -As we discussed we think that this may need further evaluation if it becomes bothersome enough to you.  We are happy to see you sooner than 3 months if you are having any worsening symptoms.  You may also work on sleep posture as you said you versus not currently ideal.   I have ordered the following labs for you:   Lab Orders         Glucose, capillary         POC Hbg A1C       Tests ordered today:  None   Referrals ordered today:   Referral Orders  No referral(s) requested today      I have ordered the following medication/changed the following medications:   Stop the following medications: Medications Discontinued During This Encounter  Medication Reason   Semaglutide,0.25 or 0.'5MG'$ /DOS, 2 MG/3ML SOPN Patient has not taken in last 30 days     Start the following medications: No orders of the defined types were placed in this encounter.     Follow up: 3 months    Remember:     Should you have any questions or concerns please call the internal medicine clinic at [phone].     Johny Blamer, [place]

## 2022-10-17 NOTE — Progress Notes (Unsigned)
   CC: Follow-up for diabetes  HPI:  Ms.Danasha L Sinkler is a 56 y.o. female with history as below presents for follow-up.  Please see encounters tab for problem-based charting.  Past Medical History:  Diagnosis Date   Agatston coronary artery calcium score less than 100    6.3 coronary Ca score 11/2021 CT   Allergic rhinitis    Anxiety    Asthma    Required inhaled corticosteroid in past.    Benign paroxysmal positional vertigo, unspecified laterality    Body mass index (BMI) 40.0-44.9, adult (HCC)    Cervical radiculopathy    Daily headache    Depression, major 02/25/2013   First episode that required was 2014. Prior episodes that did not require pt to seek medical tx.   DM (diabetes mellitus) ([place])    Fibromyalgia    Hyperlipidemia    Hypertension    2 drug therapy   Low back pain 08/10/2016   Morbid obesity due to excess calories (HCC)    Nausea 09/19/2018   OSA (obstructive sleep apnea)    Sleep study 12/2006 : Moderately Severe OSA. AHI 38.8. O2 sat decreased to 71%.   Primary insomnia    Severe obesity (BMI >= 40) (HCC) 10/08/2012   Obesity Class 3. BMI > 40.    Strain of flexor muscle of right hip 09/19/2018   Tobacco abuse    Urge incontinence    Review of Systems:   A comprehensive review of systems was negative except for: Mild decrease sensation in her right hand   Physical Exam:  Vitals:   10/17/22 1528  BP: 122/84  Temp: 99 F (37.2 C)  TempSrc: Oral  SpO2: 100%  Weight: 195 lb 3.2 oz (88.5 kg)  Height: '5\' 1"'$  (1.549 m)   Constitutional: middle-age female. In no acute distress. HENT: Normocephalic, atraumatic,  Eyes: Sclera non-icteric, EOM intact Cardio:Regular rate and rhythm. No murmurs, rubs, or gallops. 2+ bilateral radial pulses. Pulm:Clear to auscultation bilaterally. Normal work of breathing on room air. Abdomen: Soft, non-tender, non-distended, positive bowel sounds. VVO:HYWVPXTG for extremity edema. Skin:Warm and dry. Neuro:Alert  and oriented x3.  Mild sensation defect in the first second and third digit on her right hand, otherwise no focal deficit noted. Psych:Pleasant mood and affect.   Assessment & Plan:   See Encounters Tab for problem based charting.  Patient seen with Dr.  Saverio Danker

## 2022-10-18 NOTE — Progress Notes (Signed)
Internal Medicine Clinic Attending  I saw and evaluated the patient.  I personally confirmed the key portions of the history and exam documented by Dr. Goodwin and I reviewed pertinent patient test results.  The assessment, diagnosis, and plan were formulated together and I agree with the documentation in the resident's note.  

## 2022-10-18 NOTE — Assessment & Plan Note (Signed)
Patient notes mild to moderate but stable decree sensation in the first, second, and third digit on her right hand without any associated weakness.  She also notes some intermittent neck pain that is most noticeable during the night.  She does note that her sleeping postures not very good and that she is going to work on making sure that her neck stays straight.  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  She would not like any further workup at this point in time but would like [place] to keep an eye on her symptoms and reevaluate at her next visit. - Reevaluate for progression of symptoms at follow-up

## 2022-10-18 NOTE — Assessment & Plan Note (Signed)
Blood pressure today at goal at 122/84.  No issues on current therapy. - Continue lisinopril/HCTZ 20/25 mg daily

## 2022-10-18 NOTE — Assessment & Plan Note (Signed)
Patient returns after starting empagliflozin 10 mg daily and continuing metformin 1000 mg twice daily.  A1c in 06/2022 was 13.1.  A1c today was 7.9.  She notes that she is been exercising by walking daily and going to the gym a couple times a week.  She also notes very stable diet that has not cut out carbs and is very sustainable for her.  She denies any low blood sugars and notes that her morning blood sugars range between 95 and 120.  Urine microalbumin creatinine ratio checked on 08/17/2022 was normal. - Continue empagliflozin 10 mg daily and metformin 1000 mg twice daily - Return in 3 months for repeat A1c

## 2022-10-18 NOTE — Assessment & Plan Note (Signed)
Lipid panel at last visit on 08/17/2022 showed total cholesterol of 207, HDL of 58, and LDL of 130.  Patient was contacted about her results and atorvastatin 10 mg daily was started.  She denies any issues on this medication. - Continue atorvastatin 10 mg daily - Repeat lipid panel at next visit in 3 months

## 2022-10-25 NOTE — Progress Notes (Signed)
[facility] PREP Weekly Session  Patient Details  Name: Crystal English MRN: [id number] Date of Birth: 09-09-1966 Age: 56 y.o. PCP: Serita Butcher, MD  Vitals:   10/24/22 1800  Weight: 196 lb 6.4 oz (89.1 kg)     [facility] Weekly seesion - 10/25/22 1200       [facility] "PREP" Location   [facility] "PREP" Location [facility]      Weekly Session   Topic Discussed --   Portions   Minutes exercised this week 220 minutes    Classes attended to date [place] 10/25/2022, 12:05 PM

## 2022-11-22 ENCOUNTER — Other Ambulatory Visit: Payer: Self-pay

## 2022-11-22 DIAGNOSIS — I10 Essential (primary) hypertension: Secondary | ICD-10-CM

## 2022-11-22 DIAGNOSIS — E119 Type 2 diabetes mellitus without complications: Secondary | ICD-10-CM

## 2022-11-23 ENCOUNTER — Other Ambulatory Visit: Payer: Self-pay

## 2022-11-23 ENCOUNTER — Other Ambulatory Visit (HOSPITAL_COMMUNITY): Payer: Self-pay

## 2022-11-23 DIAGNOSIS — I10 Essential (primary) hypertension: Secondary | ICD-10-CM

## 2022-11-23 DIAGNOSIS — E119 Type 2 diabetes mellitus without complications: Secondary | ICD-10-CM

## 2022-11-23 MED ORDER — EMPAGLIFLOZIN 10 MG PO TABS
10.0000 mg | ORAL_TABLET | Freq: Every day | ORAL | 2 refills | Status: DC
  Filled 2022-11-23: qty 30, 30d supply, fill #0
  Filled 2022-12-28: qty 30, 30d supply, fill #1
  Filled 2023-02-01: qty 30, 30d supply, fill #2

## 2022-11-23 MED ORDER — METFORMIN HCL ER 500 MG PO TB24
1000.0000 mg | ORAL_TABLET | Freq: Two times a day (BID) | ORAL | 2 refills | Status: DC
  Filled 2022-11-23: qty 120, 30d supply, fill #0
  Filled 2022-12-28: qty 120, 30d supply, fill #1
  Filled 2023-02-01: qty 120, 30d supply, fill #2

## 2022-11-23 MED ORDER — LISINOPRIL-HYDROCHLOROTHIAZIDE 20-25 MG PO TABS
1.0000 | ORAL_TABLET | Freq: Every day | ORAL | 2 refills | Status: DC
  Filled 2022-11-23: qty 30, 30d supply, fill #0
  Filled 2022-12-28: qty 30, 30d supply, fill #1
  Filled 2023-02-01: qty 30, 30d supply, fill #2

## 2022-11-24 ENCOUNTER — Other Ambulatory Visit (HOSPITAL_COMMUNITY): Payer: Self-pay

## 2022-11-24 ENCOUNTER — Other Ambulatory Visit: Payer: Self-pay

## 2022-11-27 ENCOUNTER — Other Ambulatory Visit (HOSPITAL_COMMUNITY): Payer: Self-pay

## 2022-12-06 ENCOUNTER — Other Ambulatory Visit (HOSPITAL_COMMUNITY): Payer: Self-pay

## 2022-12-07 ENCOUNTER — Telehealth: Payer: Self-pay

## 2022-12-07 NOTE — Telephone Encounter (Signed)
Pt states she has pain on her chest, no SOB. Requesting to speak with a nurse, want to know what should she do. Please call pt back. Pt states she do not want to go to urgent care or ed until she speak with a nurse. No open appt for today or tomorrow.

## 2022-12-07 NOTE — Telephone Encounter (Signed)
Return pt's call. Pt stated she has been belching a lot and having "chest discomfort" for about 3 days. Denies sob.  Stated pain is non-radiating. I asked pt does this occurs after eating/ lying flat - she stated she has not noticed. Offered an appt - stated she wants to try taking something for the belching/heartburn. ANd pt stated if it does not get better, she knows to go to the ER.

## 2022-12-21 NOTE — Progress Notes (Signed)
Last day of PREP 12/12/22, has not returned to complete final measurements.

## 2022-12-28 ENCOUNTER — Other Ambulatory Visit: Payer: Self-pay

## 2022-12-28 ENCOUNTER — Other Ambulatory Visit (HOSPITAL_COMMUNITY): Payer: Self-pay

## 2022-12-28 NOTE — Progress Notes (Signed)
Faxed patient labs with conformation 12/27/22.

## 2023-01-08 ENCOUNTER — Telehealth: Payer: Self-pay | Admitting: Cardiology

## 2023-01-08 NOTE — Telephone Encounter (Signed)
Pt would like to have another CT cardiac scoring ordered. Please advise.

## 2023-01-08 NOTE — Telephone Encounter (Signed)
Spoke with pt and has noted some chest discomfort not sure if cardiac or GI(burping a lot )Pt was requesting Ca Score Pt had on done 11/2021  IMPRESSION: Coronary calcium score of 6.3. This was 82nd percentile for age-, race-, and sex-matched controls.  Last office visit suggests  only prn f/u  Will forward to Dr Radford Pax for review./cy

## 2023-01-09 NOTE — Telephone Encounter (Signed)
Left message per DPR asking patient to call the office.

## 2023-01-17 NOTE — Telephone Encounter (Signed)
Left message per DPR asking patient to call us regarding her symptom concerns.

## 2023-01-23 NOTE — Telephone Encounter (Signed)
Called number on DPR to ask patient more about her symptoms, no answer. Left detailed message per DPR asking patient to call us back about her symptoms. Letter sent.

## 2023-02-01 ENCOUNTER — Other Ambulatory Visit (HOSPITAL_COMMUNITY): Payer: Self-pay

## 2023-02-05 ENCOUNTER — Telehealth: Payer: Self-pay | Admitting: *Deleted

## 2023-02-05 NOTE — Telephone Encounter (Signed)
Called Crystal English for high blood sugars per request of the triage nurse:  she states her blood sugars have been 120 or less in the am and no higher than 190 in the Pm when she checks twice daily before breakfast and bedtime. She states she was well until xmas, exercising and eating well but since then has been eating wrong, has been overdoing her food, aching more, and has been in pain for the past two weeks daily, and more anxiety. She would like help figuring out what is causing her to feel this way and can wait for an appointment next week since it does not sound like hyperglycemia as she denies symptoms of high blood glucose and her glucose values reported are reasonable. She is due for an A1c and follow up: call transferred to front desk to schedule.

## 2023-02-05 NOTE — Telephone Encounter (Signed)
Patient requesting to be seen. Stating her sugars have been running high need to be seen soon.  No appointments open this week.

## 2023-02-06 ENCOUNTER — Other Ambulatory Visit: Payer: Self-pay | Admitting: Student

## 2023-02-06 DIAGNOSIS — Z1231 Encounter for screening mammogram for malignant neoplasm of breast: Secondary | ICD-10-CM

## 2023-02-13 NOTE — Progress Notes (Signed)
CC: Overdue Follow-up  HPI:   [name] is a 57 y.o. female with a past medical history of obesity, hypertension, hyperlipidemia, diabetes, tobacco use, fibromyalgia, and depression who presents for overdue follow-up. She was last seen at [facility] in 09-2022.   Past Medical History:  Diagnosis Date   Agatston coronary artery calcium score less than 100    6.3 coronary Ca score 11/2021 CT   Allergic rhinitis    Anxiety    Asthma    Required inhaled corticosteroid in past.    Benign paroxysmal positional vertigo, unspecified laterality    Body mass index (BMI) 40.0-44.9, adult (HCC)    Cervical radiculopathy    Daily headache    Depression, major 02/25/2013   First episode that required was 2014. Prior episodes that did not require pt to seek medical tx.   DM (diabetes mellitus) ([place])    Fibromyalgia    Hyperlipidemia    Hypertension    2 drug therapy   Low back pain 08/10/2016   Morbid obesity due to excess calories (HCC)    Nausea 09/19/2018   OSA (obstructive sleep apnea)    Sleep study 12/2006 : Moderately Severe OSA. AHI 38.8. O2 sat decreased to 71%.   Primary insomnia    Severe obesity (BMI >= 40) (HCC) 10/08/2012   Obesity Class 3. BMI > 40.    Strain of flexor muscle of right hip 09/19/2018   Tobacco abuse    Urge incontinence      Review of Systems:    Reports intermittent bloating, positional lightheadedness Denies blurry vision, headache, abdominal pain, nausea, diarrhea    Physical Exam:  There were no vitals filed for this visit.  General:   awake and alert, sitting comfortably in chair, cooperative, not in acute distress Skin:   warm and dry, intact without any obvious lesions or scars, no rashes Lungs:   normal respiratory effort, breathing unlabored, symmetrical chest rise Cardiac:   regular rate and rhythm, normal S1 and S2 Neurologic:   oriented to person-place-time, moving all extremities, no gross focal deficits Psychiatric:    euthymic mood with congruent affect, intelligible speech    Assessment & Plan:   Diabetes mellitus ([place]) Patient has history of diabetes diagnosed in 06-2022 with A1C of 13.1. Currently prescribed empagliflozin and metformin, to which she reports daily adherence. Denies abdominal pain, nausea, and diarrhea. Reports some intermittent bloating likely related to diet. Most recent A1C collected 09-2022 was 7.9. Glucometer interrogation today in clinic demonstrated average glucose value of 132.   - Continue metformin 1000mg  q12 and empagliflozin 10mg  q24  - Check A1C, increase empagliflozin if elevated - Ophthalmology referral for eye exam    Hyperlipidemia Patient has history of hyperlipidemia, most recent lipid panel collected 07-2022 demonstrated TChol 207 and LDL 130. At that time, she was prescribed atorvastatin. Tolerating this medication well without any myalgias, abdominal pain, nausea, or diarrhea. Discussed dietary modifications including limiting intake of foods high in saturated fat and cholesterol such as cheese or fried foods.   - Continue atorvastatin 10mg  q24 - Check lipid panel, adjust dose accordingly     Essential hypertension Patient has history of hypertension managed with lisinopril-hydrochlorothiazide, to which she reports daily adherence. Her BP today in clinic was within normal limits. Denies blurry vision and headache. Reports positional lightheadedness when standing or sitting abruptly. Encouraged hydration and counseled on ambulatory precautions.  - Continue lisinopril-hydrochlorothiazide 20-25mg  q24    Fibromyalgia Patient reports gradual worsening of chronic fibromyalgia  symptoms over time, most notably intermittent myalgias. Denies currently taking any medications. 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 Symptoms are tolerable and none  of them interfere with her daily functioning. Prefers to monitor for now and intervene if symptoms worsen.  - Monitor clinically     Healthcare maintenance Patient due for colonoscopy, mammogram, Pap smear, and vaccines. Expressed interest in colonoscopy. Currently working to schedule mammogram and Pap smear. Offered referrals, which patient has declined for now. Also declined any vaccinations.  - Referral to gastroenterology for colonoscopy      See Encounters Tab for problem based charting.  Patient discussed with Dr. Daryll Drown

## 2023-02-14 ENCOUNTER — Ambulatory Visit: Payer: Medicaid Other | Admitting: Student

## 2023-02-14 DIAGNOSIS — E7849 Other hyperlipidemia: Secondary | ICD-10-CM | POA: Diagnosis not present

## 2023-02-14 DIAGNOSIS — I10 Essential (primary) hypertension: Secondary | ICD-10-CM | POA: Diagnosis not present

## 2023-02-14 DIAGNOSIS — Z0000 Encounter for general adult medical examination without abnormal findings: Secondary | ICD-10-CM

## 2023-02-14 DIAGNOSIS — M797 Fibromyalgia: Secondary | ICD-10-CM | POA: Diagnosis not present

## 2023-02-14 DIAGNOSIS — Z1211 Encounter for screening for malignant neoplasm of colon: Secondary | ICD-10-CM

## 2023-02-14 DIAGNOSIS — Z7984 Long term (current) use of oral hypoglycemic drugs: Secondary | ICD-10-CM | POA: Diagnosis not present

## 2023-02-14 DIAGNOSIS — E119 Type 2 diabetes mellitus without complications: Secondary | ICD-10-CM | POA: Diagnosis not present

## 2023-02-14 NOTE — Assessment & Plan Note (Signed)
Patient has history of hyperlipidemia, most recent lipid panel collected 07-2022 demonstrated TChol 207 and LDL 130. At that time, she was prescribed atorvastatin. Tolerating this medication well without any myalgias, abdominal pain, nausea, or diarrhea. Discussed dietary modifications including limiting intake of foods high in saturated fat and cholesterol such as cheese or fried foods.   - Continue atorvastatin 10mg  q24 - Check lipid panel, adjust dose accordingly

## 2023-02-14 NOTE — Assessment & Plan Note (Signed)
Patient due for colonoscopy, mammogram, Pap smear, and vaccines. Expressed interest in colonoscopy. Currently working to schedule mammogram and Pap smear. Offered referrals, which patient has declined for now. Also declined any vaccinations.  - Referral to gastroenterology for colonoscopy

## 2023-02-14 NOTE — Assessment & Plan Note (Signed)
Patient reports gradual worsening of chronic fibromyalgia symptoms over time, most notably intermittent myalgias. Denies currently taking any medications. 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 Symptoms are tolerable and none of them interfere with her daily functioning. Prefers to monitor for now and intervene if symptoms worsen.  - Monitor clinically

## 2023-02-14 NOTE — Assessment & Plan Note (Signed)
Patient has history of diabetes diagnosed in 06-2022 with A1C of 13.1. Currently prescribed empagliflozin and metformin, to which she reports daily adherence. Denies abdominal pain, nausea, and diarrhea. Reports some intermittent bloating likely related to diet. Most recent A1C collected 09-2022 was 7.9. Glucometer interrogation today in clinic demonstrated average glucose value of 132.   - Continue metformin 1000mg  q12 and empagliflozin 10mg  q24  - Check A1C, increase empagliflozin if elevated - Ophthalmology referral for eye exam

## 2023-02-14 NOTE — Patient Instructions (Addendum)
  Thank you, Crystal English, for allowing [place] to provide your care today. Today we discussed . . .  > Diabetes       - continue to take your empagliflozin and metformin as prescribed       - keep up the great work on monitoring your glucose levels and watching your diet       - we are checking your A1C today and will call you with the results plus any necessary medication changes   > Hyperlipidemia       - continue to take your atorvastatin as prescribed       - we are checking your cholesterol levels today and will call you with the results plus any necessary medication changes   > Hypertension       - your blood pressure was good today, please continue to take your lisinopril-hydrochlorothiazide   I have ordered the following labs for you:  Lab Orders         Lipid Profile         Hemoglobin A1c        Tests ordered today:  none   Referrals ordered today:   Referral Orders         Ambulatory referral to Gastroenterology         Ambulatory referral to Ophthalmology        I have ordered the following medication/changed the following medications:   Stop the following medications: There are no discontinued medications.   Start the following medications: No orders of the defined types were placed in this encounter.     Follow up: 6 months    Remember:  Please continue to take your medications and keep up the great work managing your diabetes. We will call you with your laboratory test results and see you again in about six months or sooner if you need [place]!   Should you have any questions or concerns please call the internal medicine clinic at [phone].     Roswell Nickel, MD [place]

## 2023-02-14 NOTE — Assessment & Plan Note (Signed)
Patient has history of hypertension managed with lisinopril-hydrochlorothiazide, to which she reports daily adherence. Her BP today in clinic was within normal limits. Denies blurry vision and headache. Reports positional lightheadedness when standing or sitting abruptly. Encouraged hydration and counseled on ambulatory precautions.  - Continue lisinopril-hydrochlorothiazide 20-25mg  q24

## 2023-02-15 LAB — HEMOGLOBIN A1C
Est. average glucose Bld gHb Est-mCnc: 171 mg/dL
Hgb A1c MFr Bld: 7.6 % — ABNORMAL HIGH (ref 4.8–5.6)

## 2023-02-15 LAB — LIPID PANEL
Chol/HDL Ratio: 2.7 ratio (ref 0.0–4.4)
Cholesterol, Total: 165 mg/dL (ref 100–199)
HDL: 61 mg/dL (ref >39)
LDL Chol Calc (NIH): 85 mg/dL (ref 0–99)
Triglycerides: 103 mg/dL (ref 0–149)
VLDL Cholesterol Cal: 19 mg/dL (ref 5–40)

## 2023-02-22 NOTE — Progress Notes (Signed)
Internal Medicine Clinic Attending  Case discussed with Dr. Jodi Mourning  at the time of the visit.  We reviewed the resident's history and exam and pertinent patient test results.  I agree with the assessment, diagnosis, and plan of care documented in the resident's note.   A1C trending down.  LDL is improved.

## 2023-02-26 NOTE — Progress Notes (Signed)
A1C is trending down. LDL has improved. Continue current care plan.

## 2023-02-27 ENCOUNTER — Other Ambulatory Visit: Payer: Self-pay | Admitting: Student

## 2023-02-27 ENCOUNTER — Other Ambulatory Visit: Payer: Self-pay

## 2023-02-27 DIAGNOSIS — E119 Type 2 diabetes mellitus without complications: Secondary | ICD-10-CM

## 2023-02-27 DIAGNOSIS — I10 Essential (primary) hypertension: Secondary | ICD-10-CM

## 2023-02-28 ENCOUNTER — Other Ambulatory Visit (HOSPITAL_COMMUNITY): Payer: Self-pay

## 2023-02-28 MED ORDER — LISINOPRIL-HYDROCHLOROTHIAZIDE 20-25 MG PO TABS
1.0000 | ORAL_TABLET | Freq: Every day | ORAL | 2 refills | Status: DC
  Filled 2023-02-28: qty 30, 30d supply, fill #0
  Filled 2023-04-11: qty 30, 30d supply, fill #1
  Filled 2023-05-29 – 2023-06-12 (×2): qty 30, 30d supply, fill #2

## 2023-02-28 MED ORDER — METFORMIN HCL ER 500 MG PO TB24
1000.0000 mg | ORAL_TABLET | Freq: Two times a day (BID) | ORAL | 2 refills | Status: DC
  Filled 2023-02-28: qty 120, 30d supply, fill #0
  Filled 2023-04-11: qty 120, 30d supply, fill #1
  Filled 2023-05-29 – 2023-06-12 (×2): qty 120, 30d supply, fill #2

## 2023-02-28 MED ORDER — EMPAGLIFLOZIN 25 MG PO TABS
25.0000 mg | ORAL_TABLET | Freq: Every day | ORAL | 2 refills | Status: DC
  Filled 2023-02-28: qty 30, 30d supply, fill #0
  Filled 2023-04-11: qty 30, 30d supply, fill #1
  Filled 2023-05-29: qty 30, 30d supply, fill #2

## 2023-03-05 NOTE — Telephone Encounter (Signed)
Sent letter asking patient to call office regarding CT results.

## 2023-03-14 NOTE — Telephone Encounter (Signed)
Patient was calling due to a letter she just received. Patient had calcium scoring test done last year and was wondering why she received another letter. Results was received last year

## 2023-03-14 NOTE — Telephone Encounter (Signed)
Called patient to discuss her previous concerns about chest discomfort accompanied by burping. Patient states that since she called in, she has started on anxiety medication which has completely resolved her symptoms. Patient states she has discussed yearly work up with Dr. Mayford Knife due to her family history, made appt for patient on 06/22/23.

## 2023-04-02 ENCOUNTER — Ambulatory Visit
Admission: RE | Admit: 2023-04-02 | Discharge: 2023-04-02 | Disposition: A | Discharge status: Home / Self Care | Payer: Managed Care, Other (non HMO) | Source: Ambulatory Visit | Attending: Internal Medicine | Admitting: Internal Medicine

## 2023-04-02 DIAGNOSIS — Z1231 Encounter for screening mammogram for malignant neoplasm of breast: Secondary | ICD-10-CM

## 2023-04-11 ENCOUNTER — Other Ambulatory Visit: Payer: Self-pay | Admitting: Student

## 2023-04-11 ENCOUNTER — Other Ambulatory Visit: Payer: Self-pay

## 2023-04-11 DIAGNOSIS — E7849 Other hyperlipidemia: Secondary | ICD-10-CM

## 2023-04-12 ENCOUNTER — Other Ambulatory Visit (HOSPITAL_COMMUNITY): Payer: Self-pay

## 2023-04-12 LAB — HM DIABETES EYE EXAM

## 2023-04-12 MED ORDER — ATORVASTATIN CALCIUM 10 MG PO TABS
10.0000 mg | ORAL_TABLET | Freq: Every day | ORAL | 1 refills | Status: DC
  Filled 2023-04-12: qty 90, 90d supply, fill #0
  Filled 2023-05-29: qty 90, 90d supply, fill #1

## 2023-04-24 ENCOUNTER — Encounter: Payer: Self-pay | Admitting: *Deleted

## 2023-04-25 ENCOUNTER — Encounter: Payer: Self-pay | Admitting: Student

## 2023-04-25 ENCOUNTER — Ambulatory Visit (INDEPENDENT_AMBULATORY_CARE_PROVIDER_SITE_OTHER): Payer: Managed Care, Other (non HMO) | Admitting: Student

## 2023-04-25 ENCOUNTER — Other Ambulatory Visit (HOSPITAL_COMMUNITY): Payer: Self-pay

## 2023-04-25 VITALS — BP 127/88 | HR 84 | Temp 98.2°F | Ht 61.0 in | Wt 205.9 lb

## 2023-04-25 DIAGNOSIS — M797 Fibromyalgia: Secondary | ICD-10-CM | POA: Diagnosis not present

## 2023-04-25 DIAGNOSIS — F419 Anxiety disorder, unspecified: Secondary | ICD-10-CM | POA: Diagnosis not present

## 2023-04-25 DIAGNOSIS — M25561 Pain in right knee: Secondary | ICD-10-CM

## 2023-04-25 DIAGNOSIS — I10 Essential (primary) hypertension: Secondary | ICD-10-CM | POA: Diagnosis not present

## 2023-04-25 DIAGNOSIS — F325 Major depressive disorder, single episode, in full remission: Secondary | ICD-10-CM

## 2023-04-25 MED ORDER — DULOXETINE HCL 30 MG PO CPEP
30.0000 mg | ORAL_CAPSULE | Freq: Every day | ORAL | 2 refills | Status: DC
Start: 2023-04-25 — End: 2023-04-30
  Filled 2023-04-25: qty 30, 30d supply, fill #0

## 2023-04-25 MED ORDER — DULOXETINE HCL 30 MG PO CPEP
30.0000 mg | ORAL_CAPSULE | Freq: Every day | ORAL | 2 refills | Status: DC
  Filled 2023-04-25: qty 30, 30d supply, fill #0

## 2023-04-25 NOTE — Assessment & Plan Note (Signed)
GAD-7 today with score of 9, consistent with mild anxiety. Planning to restart cymbalta today for concomitant fibromyalgia pain. Will reassess for improvement in symptoms at next visit in 1 month.

## 2023-04-25 NOTE — Progress Notes (Signed)
CC: f/u fibromyalgia, episodic posterior R knee pain  HPI:  Crystal English is a 57 y.o. female with history listed below presenting to the [facility] for f/u fibromyalgia and evaluation of episodic posterior R knee pain. Please see individualized problem based charting for full HPI.  Past Medical History:  Diagnosis Date   Agatston coronary artery calcium score less than 100    6.3 coronary Ca score 11/2021 CT   Allergic rhinitis    Anxiety    Asthma    Required inhaled corticosteroid in past.    Benign paroxysmal positional vertigo, unspecified laterality    Body mass index (BMI) 40.0-44.9, adult (HCC)    Cervical radiculopathy    Daily headache    Depression, major 02/25/2013   First episode that required was 2014. Prior episodes that did not require pt to seek medical tx.   DM (diabetes mellitus) (HCC)    Fibromyalgia    Hyperlipidemia    Hypertension    2 drug therapy   Low back pain 08/10/2016   Morbid obesity due to excess calories (HCC)    Nausea 09/19/2018   OSA (obstructive sleep apnea)    Sleep study 12/2006 : Moderately Severe OSA. AHI 38.8. O2 sat decreased to 71%.   Primary insomnia    Severe obesity (BMI >= 40) (HCC) 10/08/2012   Obesity Class 3. BMI > 40.    Strain of flexor muscle of right hip 09/19/2018   Tobacco abuse    Urge incontinence     Review of Systems:  Negative aside from that listed in individualized problem based charting.  Physical Exam:  Vitals:   04/25/23 1509  BP: 127/88  Pulse: 84  Temp: 98.2 F (36.8 C)  TempSrc: Oral  SpO2: 98%  Weight: 205 lb 14.4 oz (93.4 kg)  Height: 5\' 1"  (1.549 m)   Physical Exam Constitutional:      Appearance: Normal appearance. She is obese. She is not ill-appearing.  HENT:     Mouth/Throat:     Mouth: Mucous membranes are moist.     Pharynx: Oropharynx is clear. No oropharyngeal exudate.  Eyes:     General: No scleral icterus.    Extraocular Movements: Extraocular movements intact.      Conjunctiva/sclera: Conjunctivae normal.     Pupils: Pupils are equal, round, and reactive to light.  Cardiovascular:     Rate and Rhythm: Normal rate and regular rhythm.     Heart sounds: Normal heart sounds. No murmur heard.    No friction rub. No gallop.  Pulmonary:     Effort: Pulmonary effort is normal.     Breath sounds: Normal breath sounds. No wheezing, rhonchi or rales.  Abdominal:     General: Bowel sounds are normal. There is no distension.     Palpations: Abdomen is soft.     Tenderness: There is no abdominal tenderness.  Musculoskeletal:        General: No swelling, tenderness, deformity or signs of injury. Normal range of motion.     Comments: R knee: Negative anterior drawer test, negative lachman test. No laxity with valgus or varus stress. No pain with palpation of posterior knee. No swelling noted.   Skin:    General: Skin is warm and dry.  Neurological:     General: No focal deficit present.     Mental Status: She is alert and oriented to person, place, and time.  Psychiatric:        Mood and Affect: Mood normal.  Behavior: Behavior normal.      Assessment & Plan:   See Encounters Tab for problem based charting.  Patient discussed with Dr. Antony Contras

## 2023-04-25 NOTE — Assessment & Plan Note (Signed)
BP 127/88 today in clinic, at goal. She is tolerating her zestoretic well.  Plan: -continue zestoretic 20-25mg  daily

## 2023-04-25 NOTE — Assessment & Plan Note (Signed)
She reports onset of episodic, brief R posterior knee pain that began about 2 months ago. She notes that she has pain in the back of her R knee, particularly when ascending or descending stairs. This does not happen every time she climbs stairs, maybe once every few days or so. When it comes on, she is still able to ambulate but she begins to favor her opposite leg. The pain lasts for <3 minutes each time it comes on and resolves spontaneously. She denies any recent trauma, sports injuries, sudden turning/pivoting of her knee.   On exam, she does not have any joint laxity in her R knee as compared to left knee. No swelling noted and no tenderness to palpation of knee including of posterior knee. No gaps in popliteal tendon to suggest tendinopathy.   Given that her pain is very brief and episodic, I am less concerned for severe MSK pathology. It is possible that she may have a Baker's cyst that is intermittently causing symptoms, but she does not have any symptoms on exam today. I have recommended conservative management with heating pad, tylenol prn, and knee strengthening exercises. Have advised for her to reach out to [facility] should her knee pain become persistent. Can further evaluate for possible Baker's cyst with ultrasound at that time if necessary.  Plan: -heating pad, tylenol prn, knee strengthening exercises

## 2023-04-25 NOTE — Assessment & Plan Note (Signed)
PHQ-9 of 6 today, well controlled. Starting cymbalta today for concomitant fibromyalgia pain and mild anxiety.

## 2023-04-25 NOTE — Assessment & Plan Note (Addendum)
Patient with history of fibromyalgia, diagnosed back in 2019. She was previously on cymbalta, lyrica, and gabapentin all at different times. She believes that cymbalta worked better for her and also helped with managing her anxiety as well. She has been off of these medications for a while now as she was doing well without needing them. However, about 1 month ago she noticed recurrence of her previous symptoms. She has pain in her shoulders, wrists, hands, knees, and ankles bilaterally, including days where these become functionally impairing for her forcing her to leave work and rest in her bed. She would like to restart her cymbalta today to help with relief of her symptoms. I have also recommended moderate-intensity exercise to help with reducing symptom burden.  Plan: -start cymbalta 30mg  daily -moderate-intensity exercise -f/u in 1 month, can further increase dose of cymbalta if symptoms are not well-controlled

## 2023-04-25 NOTE — Patient Instructions (Addendum)
Ms. Pizzitola,  It was a pleasure seeing you in the clinic today.   For your fibromyalgia, I have prescribed a medicine called duloxetine (aka cymbalta) to help with your pain. Please take 1 tablet daily. For your knee pain, as we discussed, please use tylenol and heating pad as needed. If your knee pain becomes more persistent, please give [place] a call and we can do an ultrasound to further evaluate it. Please come back in 1 month to check your A1c level.  Please call our clinic at [phone] if you have any questions or concerns. The best time to call is Monday-Friday from 9am-4pm, but there is someone available 24/7 at the same number. If you need medication refills, please notify your pharmacy one week in advance and they will send [place] a request.   Thank you for letting us take part in your care. We look forward to seeing you next time!

## 2023-04-26 NOTE — Progress Notes (Signed)
Internal Medicine Clinic Attending  Case discussed with Dr. Jinwala  At the time of the visit.  We reviewed the resident's history and exam and pertinent patient test results.  I agree with the assessment, diagnosis, and plan of care documented in the resident's note.  

## 2023-04-26 NOTE — Addendum Note (Signed)
Addended by: Burnell Blanks on: 04/26/2023 09:40 AM   Modules accepted: Level of Service

## 2023-04-30 ENCOUNTER — Other Ambulatory Visit: Payer: Self-pay | Admitting: Student

## 2023-04-30 ENCOUNTER — Telehealth: Payer: Self-pay

## 2023-04-30 ENCOUNTER — Other Ambulatory Visit (HOSPITAL_COMMUNITY): Payer: Self-pay

## 2023-04-30 DIAGNOSIS — M797 Fibromyalgia: Secondary | ICD-10-CM

## 2023-04-30 MED ORDER — PREGABALIN 50 MG PO CAPS: 50.0000 mg | ORAL_CAPSULE | Freq: Two times a day (BID) | ORAL | 1 refills | Status: DC

## 2023-04-30 MED ORDER — PREGABALIN 50 MG PO CAPS
50.0000 mg | ORAL_CAPSULE | Freq: Two times a day (BID) | ORAL | 1 refills | Status: DC
  Filled 2023-04-30: qty 60, 30d supply, fill #0

## 2023-04-30 NOTE — Assessment & Plan Note (Signed)
Felt sick with cymbalta therapy and experienced nausea as well. Will switch her to lyrica 50mg  BID to manage symptoms.  -stop cymbalta -start lyrica 50mg  BID

## 2023-04-30 NOTE — Addendum Note (Signed)
Addended by: Hassan Buckler on: 04/30/2023 01:37 PM   Modules accepted: Orders

## 2023-04-30 NOTE — Telephone Encounter (Signed)
I called pt to inform her of rx for Lyrica ;sent to [facility]. She stated she does not use [facility] and re-send rx to [facility]. And take [facility] off her chart.

## 2023-04-30 NOTE — Telephone Encounter (Signed)
Requesting to speak with a nurse about taking  DULoxetine (CYMBALTA) 30 MG capsule. States when she take this medication it cause her to feel very sick and nausea. Please call pt back.

## 2023-04-30 NOTE — Telephone Encounter (Signed)
Return pt's call. She stated she picked up Cymbalta on Friday and around 2:30 AM she became nauseated and "I was so sick to my stomach".; it subsided after about 2 hrs. She has not taken the med again; informed pt not take this med and I will inform the doctor. Stated she told the doctor she saw (6/5 Dr Austin Miles) she just needed a sedative to help her sleep. Stated she had taken something before but she does not remember the name.

## 2023-05-02 ENCOUNTER — Telehealth: Payer: Self-pay | Admitting: *Deleted

## 2023-05-02 DIAGNOSIS — M797 Fibromyalgia: Secondary | ICD-10-CM

## 2023-05-02 NOTE — Telephone Encounter (Signed)
Call from pt who was changed from Cymbalta to Lyrica recently. Pt stated Lyrica is causing dizzy spells which last for a pd of time. Stated she's unable to take it BID as prescribed; she cannot work and take this med; "I felt like I was high". Also stated it caused h/a's. She stated it does helps her sleep at night.

## 2023-05-04 MED ORDER — DULOXETINE HCL 30 MG PO CPEP: 30.0000 mg | ORAL_CAPSULE | Freq: Every day | ORAL | 2 refills | Status: DC

## 2023-05-04 NOTE — Assessment & Plan Note (Signed)
Low dose lyrica is causing some intermittent feelings of "medication high" for patient, she is unable to take it during daytime, especially at work. She would like to retry cymbalta at previous dose to see if her nausea symptoms improve with time, which is reasonable. She is able to take lyrica at nighttime before bed without issues. We discussed retrying cymbalta 30mg  daily and seeing how she does with this. If tolerating this dose but symptoms not well controlled, can consider cymbalta daily and possibly nightly dose of lyrica (once daily). Can also consider discontinuation of lyrica and addition of gabapentin if needed.  Plan: -stop lyrica -start cymbalta 30mg  daily

## 2023-05-07 ENCOUNTER — Ambulatory Visit: Payer: Managed Care, Other (non HMO) | Admitting: Licensed Clinical Social Worker

## 2023-05-07 ENCOUNTER — Telehealth: Payer: Self-pay

## 2023-05-07 ENCOUNTER — Other Ambulatory Visit (HOSPITAL_COMMUNITY): Payer: Self-pay

## 2023-05-07 ENCOUNTER — Telehealth: Payer: Self-pay | Admitting: *Deleted

## 2023-05-07 DIAGNOSIS — F325 Major depressive disorder, single episode, in full remission: Secondary | ICD-10-CM

## 2023-05-07 DIAGNOSIS — M797 Fibromyalgia: Secondary | ICD-10-CM

## 2023-05-07 MED ORDER — GABAPENTIN 300 MG PO CAPS
300.0000 mg | ORAL_CAPSULE | Freq: Every day | ORAL | 0 refills | Status: DC
Start: 2023-05-07 — End: 2023-05-29
  Filled 2023-05-07: qty 30, 30d supply, fill #0

## 2023-05-07 NOTE — BH Specialist Note (Signed)
Integrated Behavioral Health via Telemedicine Visit  05/07/2023 Crystal English [id number]  Number of Integrated Behavioral Health Clinician visits: No data recorded Session Start time: 1400   Session End time: 1430  Total time in minutes: 30   Referring Provider: Crissie Sickles Patient/Family location: Home [name] Provider location: [facility] All persons participating in visit: Patient and [name] Types of Service: Telephone visit, General Behavioral Integrated Care (BHI), and Introduction only  I connected with Crystal English  via  Telephone or Video Enabled Telemedicine Application  (Video is Caregility application) and verified that I am speaking with the correct person using two identifiers. Discussed confidentiality: Yes   I discussed the limitations of telemedicine and the availability of in person appointments.  Discussed there is a possibility of technology failure and discussed alternative modes of communication if that failure occurs.  I discussed that engaging in this telemedicine visit, they consent to the provision of behavioral healthcare and the services will be billed under their insurance.  Patient and/or legal guardian expressed understanding and consented to Telemedicine visit: Yes   Presenting Concerns: Patient and/or family reports the following symptoms/concerns: Stress Duration of problem: Ongoing ; Severity of problem: severe  Patient and/or Family's Strengths/Protective Factors: Social connections  Goals Addressed: Patient will:  Reduce symptoms of: depression and stress   Progress towards Goals: Ongoing  Interventions: Interventions utilized:  Motivational Interviewing, Mindfulness or Relaxation Training, and Supportive Counseling Standardized Assessments completed: PHQ-SADS     04/25/2023    4:15 PM 04/25/2023    4:14 PM 08/17/2022    3:29 PM  PHQ-SADS Last 3 Score only  Total GAD-7 Score 9    PHQ Adolescent Score  6 0        Assessment: Patient currently experiencing depression and stress.   Patient has started and stopped multiple medications and believes her depressed moods are impacted by the medication changes. Patient will begin a new pain medication and would prefer to be out of work for two weeks due to symptoms impacting her job. Patient stated she would contact front desk to request a letter from PCP to be out of work. [name] verified patient has sick time and available vacation time to use from work. [name] wanted to assure patient would have any financial barriers if absent from work.   Patient agreed to meet with [name] ongoing. Patient felt better about meeting with [name]. [name] gave patient [facility] contact information.   Patient may benefit from ongoing therapy and a short leave of absence from work.  Plan: Follow up with behavioral health clinician on : within the next 30 days.   I discussed the assessment and treatment plan with the patient and/or parent/guardian. They were provided an opportunity to ask questions and all were answered. They agreed with the plan and demonstrated an understanding of the instructions.   They were advised to call back or seek an in-person evaluation if the symptoms worsen or if the condition fails to improve as anticipated.  [name], MSW, LCSW-A She/Her Behavioral Health Clinician [facility]  Internal Medicine Center Direct [phone]  Fax [phone] Main Office Phone: [phone] [address] [place] [place], [place] [address] Website: [facility] Internal Medicine [facility]  [place], [place]  [place]

## 2023-05-07 NOTE — Telephone Encounter (Signed)
Patient called she is requesting a work note for her to be out of work for 1 and a half weeks instead of 2 weeks, patient is requesting the note on my chart.

## 2023-05-07 NOTE — Telephone Encounter (Signed)
Call from patient states that has tried option that she talked with Dr, Austin Miles  about on Friday.  Continues to be in pan.   Cymbalta is making her sick even when taking at night.  Went out earlier today and has had some Suicidal thoughts.  Feels okay at present but would like to talk to some if possible.  Please make a referral to [facility] as patient can be scheduled for this afternoon.

## 2023-05-07 NOTE — Assessment & Plan Note (Signed)
She took cymbalta again and noted suicidal thoughts. She denies any feelings of wanting to harm herself or others but just a fleeting moment where suicidal thoughts entered her head. We discussed cessation of cymbalta given adverse effect. She was also unable to tolerate lyrica recently. It is unusual given she tolerated both cymbalta and lyrica in the past for management of her fibromyalgia. Nonetheless, will instead trial gabapentin therapy as her pain continues to be an issue for her. Will trial low dose gabapentin (300mg  at bedtime) for management of fibromyalgia pain.  Plan: -stop cymbalta -start gabapentin 300mg  qhs

## 2023-05-08 ENCOUNTER — Encounter: Payer: Self-pay | Admitting: Student

## 2023-05-14 ENCOUNTER — Other Ambulatory Visit (HOSPITAL_COMMUNITY): Payer: Self-pay

## 2023-05-14 ENCOUNTER — Telehealth: Payer: Self-pay | Admitting: Student

## 2023-05-14 NOTE — Telephone Encounter (Signed)
RTC to patient.  Sted she missed call from doctor.  Patient was informed per Dr. Everardo Pacific message that she can take 2 of the Gabapentin capsules 2 times a day. Should not take 3 at a time and to call when she needs a refill.  Patient voiced understanding of the message  and will call for refills when needed.

## 2023-05-14 NOTE — Telephone Encounter (Signed)
RTC from patient started to take 2 of the Gabapentin. Worked better than 1 got rid of most of the pain.  Wants to know if she can continue the 2 Gabapentin and if needed go to 3 if needed.

## 2023-05-14 NOTE — Telephone Encounter (Signed)
Pt returning call about how the following medications is doing with her pain.  Pt requesting a call back to discuss that she is now taking 2 pills instead of 1 and it does seem to help.  Pt also questions about her current prescription if she is taking 2 now instead of one.  Please call the patient back.  gabapentin (NEURONTIN) 300 MG capsule     [facility] [place]

## 2023-05-14 NOTE — Telephone Encounter (Signed)
RTC to patient.  Message left that Clinics had returned her call.   ?

## 2023-05-17 ENCOUNTER — Encounter: Payer: Self-pay | Admitting: Dietician

## 2023-05-29 ENCOUNTER — Other Ambulatory Visit (HOSPITAL_COMMUNITY): Payer: Self-pay

## 2023-05-29 ENCOUNTER — Other Ambulatory Visit: Payer: Self-pay | Admitting: Student

## 2023-05-29 ENCOUNTER — Other Ambulatory Visit: Payer: Self-pay

## 2023-05-29 DIAGNOSIS — M797 Fibromyalgia: Secondary | ICD-10-CM

## 2023-05-29 MED ORDER — GABAPENTIN 300 MG PO CAPS
300.0000 mg | ORAL_CAPSULE | Freq: Every day | ORAL | 3 refills | Status: DC
Start: 2023-05-29 — End: 2024-07-08
  Filled 2023-05-29 – 2023-07-02 (×2): qty 90, 90d supply, fill #0
  Filled 2023-09-13: qty 90, 90d supply, fill #1
  Filled 2023-10-26 – 2023-12-02 (×4): qty 90, 90d supply, fill #2
  Filled 2023-12-04: qty 30, 30d supply, fill #2

## 2023-05-31 ENCOUNTER — Ambulatory Visit: Payer: Managed Care, Other (non HMO) | Admitting: Licensed Clinical Social Worker

## 2023-06-01 ENCOUNTER — Other Ambulatory Visit (HOSPITAL_COMMUNITY): Payer: Self-pay

## 2023-06-01 ENCOUNTER — Ambulatory Visit (INDEPENDENT_AMBULATORY_CARE_PROVIDER_SITE_OTHER): Payer: Managed Care, Other (non HMO) | Admitting: Student

## 2023-06-01 ENCOUNTER — Telehealth: Payer: Self-pay | Admitting: *Deleted

## 2023-06-01 DIAGNOSIS — R0781 Pleurodynia: Secondary | ICD-10-CM | POA: Diagnosis not present

## 2023-06-01 DIAGNOSIS — E119 Type 2 diabetes mellitus without complications: Secondary | ICD-10-CM

## 2023-06-01 DIAGNOSIS — M546 Pain in thoracic spine: Secondary | ICD-10-CM

## 2023-06-01 DIAGNOSIS — Z720 Tobacco use: Secondary | ICD-10-CM

## 2023-06-01 DIAGNOSIS — I10 Essential (primary) hypertension: Secondary | ICD-10-CM

## 2023-06-01 DIAGNOSIS — F325 Major depressive disorder, single episode, in full remission: Secondary | ICD-10-CM

## 2023-06-01 MED ORDER — METHOCARBAMOL 1000 MG PO TABS
1.0000 g | ORAL_TABLET | Freq: Three times a day (TID) | ORAL | 0 refills | Status: DC
Start: 2023-06-01 — End: 2023-07-16

## 2023-06-01 NOTE — Telephone Encounter (Signed)
Patient has a new number [phone]

## 2023-06-01 NOTE — Patient Instructions (Signed)
Remember to bring all of the medications that you take (including over the counter medications and supplements) with you to every clinic visit.  This after visit summary is an important review of tests, referrals, and medication changes that were discussed during your visit. If you have questions or concerns, call [phone]. Outside of clinic business hours, call the main hospital at [phone] and ask the operator for the on-call internal medicine resident.   [name] MD 06/01/2023, 9:52 AM

## 2023-06-01 NOTE — Progress Notes (Signed)
   I connected with  Crystal English on 06/01/23 by telephone and verified that I am speaking with the correct person using two identifiers.   I discussed the limitations of evaluation and management by telemedicine. The patient expressed understanding and agreed to proceed.  CC: back pain  This is a telephone encounter between Crystal English and Crystal English on 06/01/2023 for back pain. The visit was conducted with the patient located at home and Crystal English at [facility]. The patient's identity was confirmed using their DOB and current address. The patient has consented to being evaluated through a telephone encounter and understands the associated risks (an examination cannot be done and the patient may need to come in for an appointment) / benefits (allows the patient to remain at home, decreasing exposure to coronavirus). I personally spent 15 minutes on medical discussion.   HPI:  [name] is a 57 y.o. with PMH as below.   Please see A&P for assessment of the patient's acute and chronic medical conditions.   Past Medical History:  Diagnosis Date   Agatston coronary artery calcium score less than 100    6.3 coronary Ca score 11/2021 CT   Allergic rhinitis    Anxiety    Asthma    Required inhaled corticosteroid in past.    Benign paroxysmal positional vertigo, unspecified laterality    Body mass index (BMI) 40.0-44.9, adult (HCC)    Cervical radiculopathy    Daily headache    Depression, major 02/25/2013   First episode that required was 2014. Prior episodes that did not require pt to seek medical tx.   DM (diabetes mellitus) (HCC)    Fibromyalgia    Hyperlipidemia    Hypertension    2 drug therapy   Left knee pain 01/15/2020   Low back pain 08/10/2016   Morbid obesity due to excess calories (HCC)    Nausea 09/19/2018   OSA (obstructive sleep apnea)    Sleep study 12/2006 : Moderately Severe OSA. AHI 38.8. O2 sat decreased to 71%.   Primary  insomnia    Severe obesity (BMI >= 40) (HCC) 10/08/2012   Obesity Class 3. BMI > 40.    Strain of flexor muscle of right hip 09/19/2018   Tobacco abuse    Urge incontinence    Review of Systems:  No fevers, diaphoresis, chest pain, breathing troubles, cough, recent trauma or fall  Assessment & Plan:   Pleuritic pain Hurts in her back when she takes a deep breath, mostly on the left side. Woke up with this pain. Not bothering her when she does not take a deep breath. A little bit tender to the touch. No cough. No fevers. No major weight changes. No vomiting. One night of isolated sweating, otherwise no diaphoresis. No trauma. No chest pain. Nurse present with this patient says the patient's pulse is not fast. Impression is for a benign cause of pain like musculoskeletal pain. Don't think she needs to be seen in person at this point. Will try a short course of methocarbamol over the weekend. If she doesn't feel better she should come to clinic next week for in person visit. Go to ED for shortness of breath, chest pain, light-headedness.    Patient discussed with Dr. Durene Romans MD [facility] Internal Medicine  PGY-1 Pager: [phone] Date 06/01/2023  Time 9:52 AM

## 2023-06-01 NOTE — Telephone Encounter (Signed)
Call from patient c/o of pain in back when she takes a deep breath. No color changes or coughing noted.  Wants to know what she can do.  Patient offered and appointment this am.  Patient to have TeleHealth appointment to discuss complaint.

## 2023-06-01 NOTE — Assessment & Plan Note (Signed)
Hurts in her back when she takes a deep breath, mostly on the left side. Woke up with this pain. Not bothering her when she does not take a deep breath. A little bit tender to the touch. No cough. No fevers. No major weight changes. No vomiting. One night of isolated sweating, otherwise no diaphoresis. No trauma. No chest pain. Nurse present with this patient says the patient's pulse is not fast. Impression is for a benign cause of pain like musculoskeletal pain. Don't think she needs to be seen in person at this point. Will try a short course of methocarbamol over the weekend. If she doesn't feel better she should come to clinic next week for in person visit. Go to ED for shortness of breath, chest pain, light-headedness.

## 2023-06-05 ENCOUNTER — Ambulatory Visit (INDEPENDENT_AMBULATORY_CARE_PROVIDER_SITE_OTHER): Payer: 59 | Admitting: Licensed Clinical Social Worker

## 2023-06-05 DIAGNOSIS — F325 Major depressive disorder, single episode, in full remission: Secondary | ICD-10-CM

## 2023-06-05 NOTE — Progress Notes (Signed)
 Internal Medicine Clinic Attending  Case discussed with the resident at the time of the visit.  We reviewed the resident's history and exam and pertinent patient test results.  I agree with the assessment, diagnosis, and plan of care documented in the resident's note.  

## 2023-06-05 NOTE — BH Specialist Note (Signed)
Patient no-showed today's appointment; appointment was for Telephone visit at 9:00am  Behavioral Health Clinician [facility] from here on out)  attempted patient twice via Telephone number [phone]. [facility] could not get through and unable to leave a VM.   Patient will need to reschedule appointment by calling Internal medicine center [phone].  [name], MSW, LCSW-A She/Her Behavioral Health Clinician [facility]  Internal Medicine Center Direct Dial:[phone]  Fax [phone] Main Office Phone: [phone] [address] [place] [place], [place] [address] Website: [facility] Internal Medicine [facility]  [place], [place]  [place]

## 2023-06-07 ENCOUNTER — Other Ambulatory Visit (HOSPITAL_COMMUNITY): Payer: Self-pay

## 2023-06-08 ENCOUNTER — Ambulatory Visit: Payer: Managed Care, Other (non HMO) | Admitting: Student

## 2023-06-08 ENCOUNTER — Encounter: Payer: Self-pay | Admitting: Student

## 2023-06-08 VITALS — BP 120/91 | HR 88 | Temp 98.4°F | Ht 61.0 in | Wt 201.8 lb

## 2023-06-08 DIAGNOSIS — Z6838 Body mass index (BMI) 38.0-38.9, adult: Secondary | ICD-10-CM

## 2023-06-08 DIAGNOSIS — E669 Obesity, unspecified: Secondary | ICD-10-CM

## 2023-06-08 DIAGNOSIS — E119 Type 2 diabetes mellitus without complications: Secondary | ICD-10-CM | POA: Diagnosis not present

## 2023-06-08 DIAGNOSIS — M797 Fibromyalgia: Secondary | ICD-10-CM

## 2023-06-08 DIAGNOSIS — Z7984 Long term (current) use of oral hypoglycemic drugs: Secondary | ICD-10-CM

## 2023-06-08 DIAGNOSIS — Z0000 Encounter for general adult medical examination without abnormal findings: Secondary | ICD-10-CM

## 2023-06-08 NOTE — Assessment & Plan Note (Signed)
States follows with OBGYN and plans to complete pap smear there. Referral already placed to GI for screening colonoscopy.

## 2023-06-08 NOTE — Assessment & Plan Note (Addendum)
States doing well today. Stable and no acute concerns. Remains on gabapentin 300 mg daily.   Plan -Continue gabapentin

## 2023-06-08 NOTE — Assessment & Plan Note (Addendum)
Last A1c improved to 7.6%. Taking Jardiance 25 mg daily and metformin 1000 mg BID. Tolerating well and reports adherence. Discussed GLP-1 today. States was on Ozempic before and stopped due to metallic taste after injection. Denies any other side effects. Discussed potential weight loss with starting GLP-1 but patient declined at this time stating not ready for another medication.   Plan -Continue metformin and Jardiance -Continue discussions about GLP-1 at future visit -Repeat A1c at next visit

## 2023-06-08 NOTE — Assessment & Plan Note (Signed)
BMI 38.13 today.  States was losing weight with the PREP program but had to stop it due to her fibromyalgia. Her weight increased after the program and is interested in returning to the Y program. Discussed about reaching out to the Y about other options, referral to [facility] or initiation of GLP-1 given T2DM. Patient declined referral to HWW and starting GLP-1 at this time until further info about the Y.   Plan -Discuss other options at next visit if unable to join Y

## 2023-06-08 NOTE — Patient Instructions (Addendum)
Thank you, Crystal English for allowing [place] to provide your care today. Today we discussed:  -Glad to hear fibromyalgia is doing better.  -We will try to reach about the Y program. New referral sent today.  -Discussed about the office for [facility]. -Referral already sent for screening colonoscopy.   [facility] Gastroenterologist in [place], [place] Located in: [place] [address] Address: [address], [place], [place] [address] Phone: [phone]  Referrals ordered today:   Referral Orders         Amb Referral To Provider Referral Exercise Program (P.R.E.P)       Follow up:  2-3 months    Should you have any questions or concerns please call the internal medicine clinic at [phone].    Rana Snare, D.O. [facility] Internal Medicine Center

## 2023-06-08 NOTE — Progress Notes (Signed)
CC: Weight loss, PREP program  HPI:  [name] is a 57 y.o. female living with a history stated below and presents today for weight loss and PREP program. Please see problem based assessment and plan for additional details.  Past Medical History:  Diagnosis Date   Agatston coronary artery calcium score less than 100    6.3 coronary Ca score 11/2021 CT   Allergic rhinitis    Anxiety    Asthma    Required inhaled corticosteroid in past.    Benign paroxysmal positional vertigo, unspecified laterality    Body mass index (BMI) 40.0-44.9, adult (HCC)    Cervical radiculopathy    Daily headache    Depression, major 02/25/2013   First episode that required was 2014. Prior episodes that did not require pt to seek medical tx.   DM (diabetes mellitus) (HCC)    Fibromyalgia    Hyperlipidemia    Hypertension    2 drug therapy   Left knee pain 01/15/2020   Low back pain 08/10/2016   Morbid obesity due to excess calories (HCC)    Nausea 09/19/2018   OSA (obstructive sleep apnea)    Sleep study 12/2006 : Moderately Severe OSA. AHI 38.8. O2 sat decreased to 71%.   Primary insomnia    Severe obesity (BMI >= 40) (HCC) 10/08/2012   Obesity Class 3. BMI > 40.    Strain of flexor muscle of right hip 09/19/2018   Tobacco abuse    Urge incontinence     Current Outpatient Medications on File Prior to Visit  Medication Sig Dispense Refill   atorvastatin (LIPITOR) 10 MG tablet Take 1 tablet (10 mg total) by mouth daily. 90 tablet 1   Blood Glucose Monitoring Suppl (ONETOUCH VERIO) w/Device KIT PLEASE CHECK BLOOD SUGAR 3 TIMES DAILY 1 kit 0   Blood Glucose Monitoring Suppl (TRUE METRIX METER) w/Device KIT Use as directed 1 kit 0   empagliflozin (JARDIANCE) 25 MG TABS tablet Take 1 tablet (25 mg total) by mouth daily. 30 tablet 2   gabapentin (NEURONTIN) 300 MG capsule Take 1 capsule (300 mg total) by mouth at bedtime. 90 capsule 3   glucose blood test strip Please check blood sugars  3 times daily 100 each 1   Lancets (ONETOUCH ULTRASOFT) lancets Please check blood sugars 3 times daily 100 each 12   lisinopril-hydrochlorothiazide (ZESTORETIC) 20-25 MG tablet Take 1 tablet by mouth daily. 30 tablet 2   metFORMIN (GLUCOPHAGE-XR) 500 MG 24 hr tablet Take 2 tablets (1,000 mg total) by mouth 2 (two) times daily. 120 tablet 2   Methocarbamol 1000 MG TABS Take 1 g by mouth in the morning, at noon, and at bedtime. 10 tablet 0   TRUEplus Lancets 30G MISC Use to check blood sugar 3 (three) times daily. 100 each 0   No current facility-administered medications on file prior to visit.   Review of Systems: ROS negative except for what is noted on the assessment and plan.  Vitals:   06/08/23 0917  BP: (!) 120/91  Pulse: 88  Temp: 98.4 F (36.9 C)  TempSrc: Oral  SpO2: 98%  Weight: 201 lb 12.8 oz (91.5 kg)  Height: 5\' 1"  (1.549 m)   Physical Exam: Constitutional: well-appearing female sitting in chair comfortably, in no acute distress Cardiovascular: regular rate and rhythm Pulmonary/Chest: normal work of breathing on room air, lungs clear to auscultation bilaterally MSK: no LE edema Neurological: alert & oriented x 3 Psych: pleasant mood  Assessment & Plan:  Severe obesity (BMI 35.0-35.9 with comorbidity) (HCC) BMI 38.13 today.  States was losing weight with the PREP program but had to stop it due to her fibromyalgia. Her weight increased after the program and is [facility]. Discussed about reaching out to [facility] about other options, referral to [facility] or initiation of GLP-1 given T2DM. Patient declined referral to [facility] and starting GLP-1 at this time until further info about the [facility].   Plan -Discuss other options at next visit if unable to join [facility]  Fibromyalgia States doing well today. Stable and no acute concerns. Remains on gabapentin 300 mg daily.   Plan -Continue gabapentin   Healthcare maintenance States follows  with OBGYN and plans to complete pap smear there. Referral already placed to GI for screening colonoscopy.   Diabetes mellitus (HCC) Last A1c improved to 7.6%. Taking Jardiance 25 mg daily and metformin 1000 mg BID. Tolerating well and reports adherence. Discussed GLP-1 today. States was on Ozempic before and stopped due to metallic taste after injection. Denies any other side effects. Discussed potential weight loss with starting GLP-1 but patient declined at this time stating not ready for another medication.   Plan -Continue metformin and Jardiance -Continue discussions about GLP-1 at future visit -Repeat A1c at next visit   Patient discussed with Dr. Docia Furl, D.O. [facility], PGY-2 Phone: [phone] Date 06/08/2023 Time 4:01 PM

## 2023-06-12 ENCOUNTER — Other Ambulatory Visit (HOSPITAL_COMMUNITY): Payer: Self-pay

## 2023-06-13 NOTE — Progress Notes (Signed)
Internal Medicine Clinic Attending  Case discussed with the resident at the time of the visit.  We reviewed the resident's history and exam and pertinent patient test results.  I agree with the assessment, diagnosis, and plan of care documented in the resident's note.  

## 2023-06-15 ENCOUNTER — Encounter: Payer: Self-pay | Admitting: Student

## 2023-06-18 ENCOUNTER — Other Ambulatory Visit (HOSPITAL_COMMUNITY): Payer: Self-pay

## 2023-06-18 ENCOUNTER — Other Ambulatory Visit: Payer: Self-pay

## 2023-06-18 DIAGNOSIS — E7849 Other hyperlipidemia: Secondary | ICD-10-CM

## 2023-06-18 MED ORDER — ATORVASTATIN CALCIUM 10 MG PO TABS
10.0000 mg | ORAL_TABLET | Freq: Every day | ORAL | 3 refills | Status: DC
Start: 2023-06-18 — End: 2024-06-04
  Filled 2023-06-18 – 2023-07-05 (×2): qty 90, 90d supply, fill #0
  Filled 2023-08-17 – 2023-10-26 (×2): qty 90, 90d supply, fill #1
  Filled 2024-01-14 – 2024-01-29 (×5): qty 90, 90d supply, fill #2
  Filled 2024-05-05: qty 90, 90d supply, fill #3

## 2023-06-18 NOTE — Telephone Encounter (Signed)
Pharmacy is requesting a 90 day supply

## 2023-06-19 ENCOUNTER — Other Ambulatory Visit (HOSPITAL_COMMUNITY): Payer: Self-pay

## 2023-06-22 ENCOUNTER — Ambulatory Visit: Payer: Managed Care, Other (non HMO) | Admitting: Cardiology

## 2023-07-02 ENCOUNTER — Other Ambulatory Visit (HOSPITAL_COMMUNITY): Payer: Self-pay

## 2023-07-02 NOTE — Progress Notes (Deleted)
Office Visit    Patient Name: Crystal English Date of Encounter: 07/02/2023  Primary Care Provider:  Carmina Miller, DO Primary Cardiologist:  Armanda Magic, MD Primary Electrophysiologist: None   Past Medical History    Past Medical History:  Diagnosis Date   Agatston coronary artery calcium score less than 100    6.3 coronary Ca score 11/2021 CT   Allergic rhinitis    Anxiety    Asthma    Required inhaled corticosteroid in past.    Benign paroxysmal positional vertigo, unspecified laterality    Body mass index (BMI) 40.0-44.9, adult (HCC)    Cervical radiculopathy    Daily headache    Depression, major 02/25/2013   First episode that required was 2014. Prior episodes that did not require pt to seek medical tx.   DM (diabetes mellitus) (HCC)    Fibromyalgia    Hyperlipidemia    Hypertension    2 drug therapy   Left knee pain 01/15/2020   Low back pain 08/10/2016   Morbid obesity due to excess calories (HCC)    Nausea 09/19/2018   OSA (obstructive sleep apnea)    Sleep study 12/2006 : Moderately Severe OSA. AHI 38.8. O2 sat decreased to 71%.   Primary insomnia    Severe obesity (BMI >= 40) (HCC) 10/08/2012   Obesity Class 3. BMI > 40.    Strain of flexor muscle of right hip 09/19/2018   Tobacco abuse    Urge incontinence    Past Surgical History:  Procedure Laterality Date   DILATION AND CURETTAGE OF UTERUS     ECTOPIC PREGNANCY SURGERY     LAPAROTOMY     for tubal pregnancy   TUBAL LIGATION      Allergies  No Known Allergies   History of Present Illness    Crystal English  is a 57 year old female with a PMH of HTN, HLD, fibromyalgia, anxiety, DM type II, asthma, strong family history of heart disease who presents today for follow-up.   Ms. Cal was seen initially by Dr. Mayford Knife on 10/20/2021 for complaint of palpitations.  She was given a 30-day event monitor for evaluation of palpitations that showed sinus rhythm with sustained no  arrhythmias.  She completed coronary CTA due to strong family history of coronary disease that showed calcium score of 6.3 which is 82 percentile with no significant disease and coronaries.  She is currently on atorvastatin 10 mg daily and patient also completed a 2D echo that showed normal EF of 50-55% with no RWMA and grade 1 DD and no LVH.   Since last being seen in the office patient reports***.  Patient denies chest pain, palpitations, dyspnea, PND, orthopnea, nausea, vomiting, dizziness, syncope, edema, weight gain, or early satiety.     ***Notes:  Home Medications    Current Outpatient Medications  Medication Sig Dispense Refill   atorvastatin (LIPITOR) 10 MG tablet Take 1 tablet (10 mg total) by mouth daily. 90 tablet 3   Blood Glucose Monitoring Suppl (ONETOUCH VERIO) w/Device KIT PLEASE CHECK BLOOD SUGAR 3 TIMES DAILY 1 kit 0   Blood Glucose Monitoring Suppl (TRUE METRIX METER) w/Device KIT Use as directed 1 kit 0   empagliflozin (JARDIANCE) 25 MG TABS tablet Take 1 tablet (25 mg total) by mouth daily. 30 tablet 2   gabapentin (NEURONTIN) 300 MG capsule Take 1 capsule (300 mg total) by mouth at bedtime. 90 capsule 3   glucose blood test strip Please check blood sugars  3 times daily 100 each 1   Lancets (ONETOUCH ULTRASOFT) lancets Please check blood sugars 3 times daily 100 each 12   lisinopril-hydrochlorothiazide (ZESTORETIC) 20-25 MG tablet Take 1 tablet by mouth daily. 30 tablet 2   metFORMIN (GLUCOPHAGE-XR) 500 MG 24 hr tablet Take 2 tablets (1,000 mg total) by mouth 2 (two) times daily. 120 tablet 2   Methocarbamol 1000 MG TABS Take 1 g by mouth in the morning, at noon, and at bedtime. 10 tablet 0   TRUEplus Lancets 30G MISC Use to check blood sugar 3 (three) times daily. 100 each 0   No current facility-administered medications for this visit.     Review of Systems  Please see the history of present illness.    (+)*** (+)***  All other systems reviewed and are  otherwise negative except as noted above.  Physical Exam    Wt Readings from Last 3 Encounters:  06/08/23 201 lb 12.8 oz (91.5 kg)  04/25/23 205 lb 14.4 oz (93.4 kg)  10/24/22 196 lb 6.4 oz (89.1 kg)   WU:JWJXB were no vitals filed for this visit.,There is no height or weight on file to calculate BMI.  Constitutional:      Appearance: Healthy appearance. Not in distress.  Neck:     Vascular: JVD normal.  Pulmonary:     Effort: Pulmonary effort is normal.     Breath sounds: No wheezing. No rales. Diminished in the bases Cardiovascular:     Normal rate. Regular rhythm. Normal S1. Normal S2.      Murmurs: There is no murmur.  Edema:    Peripheral edema absent.  Abdominal:     Palpations: Abdomen is soft non tender. There is no hepatomegaly.  Skin:    General: Skin is warm and dry.  Neurological:     General: No focal deficit present.     Mental Status: Alert and oriented to person, place and time.     Cranial Nerves: Cranial nerves are intact.  EKG/LABS/ Recent Cardiac Studies    ECG personally reviewed by me today - ***   Risk Assessment/Calculations:   {Does this patient have ATRIAL FIBRILLATION?:[phone]}        Lab Results  Component Value Date   WBC 7.0 07/02/2022   HGB 14.2 07/02/2022   HCT 44.2 07/02/2022   MCV 90.9 07/02/2022   PLT 253 07/02/2022   Lab Results  Component Value Date   CREATININE 1.09 (H) 07/02/2022   BUN 12 07/02/2022   NA 135 07/02/2022   K 3.9 07/02/2022   CL 97 (L) 07/02/2022   CO2 28 07/02/2022   Lab Results  Component Value Date   ALT 10 06/23/2014   AST 13 06/23/2014   ALKPHOS 99 06/23/2014   BILITOT 0.3 06/23/2014   Lab Results  Component Value Date   CHOL 165 02/14/2023   HDL 61 02/14/2023   LDLCALC 85 02/14/2023   TRIG 103 02/14/2023   CHOLHDL 2.7 02/14/2023    Lab Results  Component Value Date   HGBA1C 7.6 (H) 02/14/2023     Assessment & Plan    1.  Family history of CAD: -Patient has significant  family history of coronary disease -Patient completed coronary calcium scoring showing calcium score of 6.3% which is 82nd percentile and was started on that in therapy -Today patient reports***  2.  Hyperlipidemia: -Today was*** -Continue***  3.  Essential hypertension: -Patient blood pressure today was*** -Continue***  4.  DM type II: -Most recent hemoglobin A1c  was*** -Continue***      Disposition: Follow-up with Armanda Magic, MD or APP in *** months {Are you ordering a CV Procedure (e.g. stress test, cath, DCCV, TEE, etc)?   Press F2        :[id number]}   Medication Adjustments/Labs and Tests Ordered: Current medicines are reviewed at length with the patient today.  Concerns regarding medicines are outlined above.   Signed, Napoleon Form, Leodis Rains, NP 07/02/2023, 5:41 PM [facility]

## 2023-07-03 ENCOUNTER — Encounter: Payer: Self-pay | Admitting: Nurse Practitioner

## 2023-07-03 ENCOUNTER — Ambulatory Visit: Payer: Medicaid Other | Attending: Cardiology | Admitting: Nurse Practitioner

## 2023-07-03 DIAGNOSIS — E119 Type 2 diabetes mellitus without complications: Secondary | ICD-10-CM

## 2023-07-03 DIAGNOSIS — Z8249 Family history of ischemic heart disease and other diseases of the circulatory system: Secondary | ICD-10-CM

## 2023-07-03 DIAGNOSIS — E7849 Other hyperlipidemia: Secondary | ICD-10-CM

## 2023-07-03 DIAGNOSIS — I10 Essential (primary) hypertension: Secondary | ICD-10-CM

## 2023-07-05 ENCOUNTER — Other Ambulatory Visit (HOSPITAL_COMMUNITY): Payer: Self-pay

## 2023-07-10 ENCOUNTER — Other Ambulatory Visit: Payer: Self-pay

## 2023-07-12 ENCOUNTER — Telehealth: Payer: Self-pay

## 2023-07-12 ENCOUNTER — Other Ambulatory Visit (HOSPITAL_COMMUNITY): Payer: Self-pay

## 2023-07-12 ENCOUNTER — Other Ambulatory Visit: Payer: Self-pay

## 2023-07-12 DIAGNOSIS — E119 Type 2 diabetes mellitus without complications: Secondary | ICD-10-CM

## 2023-07-12 DIAGNOSIS — I10 Essential (primary) hypertension: Secondary | ICD-10-CM

## 2023-07-12 NOTE — Telephone Encounter (Signed)
Pt states she is having pain all over her body, hurting throughout the day and unable to rest at night. Requesting gabapentin to be increased, please call pt back.

## 2023-07-12 NOTE — Telephone Encounter (Signed)
RTC to patient states is hurting a lot more.  Wants to know if she can increase her Gabapentin.  Is affecting all aspects of life due to the pain.  Wants to know what she can do.  Unable to raise shoulders with pain.

## 2023-07-13 ENCOUNTER — Other Ambulatory Visit: Payer: Self-pay | Admitting: Student

## 2023-07-13 ENCOUNTER — Other Ambulatory Visit (HOSPITAL_COMMUNITY): Payer: Self-pay

## 2023-07-13 DIAGNOSIS — E119 Type 2 diabetes mellitus without complications: Secondary | ICD-10-CM

## 2023-07-13 DIAGNOSIS — I10 Essential (primary) hypertension: Secondary | ICD-10-CM

## 2023-07-14 ENCOUNTER — Other Ambulatory Visit (HOSPITAL_COMMUNITY): Payer: Self-pay

## 2023-07-14 MED ORDER — EMPAGLIFLOZIN 25 MG PO TABS
25.0000 mg | ORAL_TABLET | Freq: Every day | ORAL | 2 refills | Status: DC
  Filled 2023-07-14: qty 30, 30d supply, fill #0
  Filled 2023-08-17: qty 30, 30d supply, fill #1
  Filled 2023-09-25: qty 30, 30d supply, fill #2

## 2023-07-14 MED ORDER — LISINOPRIL-HYDROCHLOROTHIAZIDE 20-25 MG PO TABS
1.0000 | ORAL_TABLET | Freq: Every day | ORAL | 2 refills | Status: DC
Start: 2023-07-14 — End: 2023-10-29
  Filled 2023-07-14: qty 30, 30d supply, fill #0
  Filled 2023-08-17: qty 30, 30d supply, fill #1
  Filled 2023-09-25: qty 30, 30d supply, fill #2

## 2023-07-16 ENCOUNTER — Encounter (HOSPITAL_BASED_OUTPATIENT_CLINIC_OR_DEPARTMENT_OTHER): Payer: Self-pay | Admitting: Emergency Medicine

## 2023-07-16 ENCOUNTER — Other Ambulatory Visit: Payer: Self-pay | Admitting: Student

## 2023-07-16 ENCOUNTER — Emergency Department (HOSPITAL_BASED_OUTPATIENT_CLINIC_OR_DEPARTMENT_OTHER)
Admission: EM | Admit: 2023-07-16 | Discharge: 2023-07-16 | Disposition: A | Discharge status: Home / Self Care | Payer: Medicaid Other | Attending: Emergency Medicine | Admitting: Emergency Medicine

## 2023-07-16 ENCOUNTER — Other Ambulatory Visit (HOSPITAL_COMMUNITY): Payer: Self-pay

## 2023-07-16 ENCOUNTER — Other Ambulatory Visit: Payer: Self-pay

## 2023-07-16 DIAGNOSIS — M797 Fibromyalgia: Secondary | ICD-10-CM | POA: Insufficient documentation

## 2023-07-16 DIAGNOSIS — E119 Type 2 diabetes mellitus without complications: Secondary | ICD-10-CM

## 2023-07-16 IMAGING — CT CT HEAD W/O CM
4 series · 17 of 47 positions shown, 19 images · non-contrast
Comparison: None.

CLINICAL DATA: Headache

EXAM:
CT HEAD WITHOUT CONTRAST
TECHNIQUE: Contiguous axial images were obtained from the base of the skull
through the vertex without intravenous contrast.

[Series 2: head wo · axial · 0.47mm/px · z∈[-88,+22]mm · 7 of 30 slices shown, 9 images]
[im 4/30  brain]
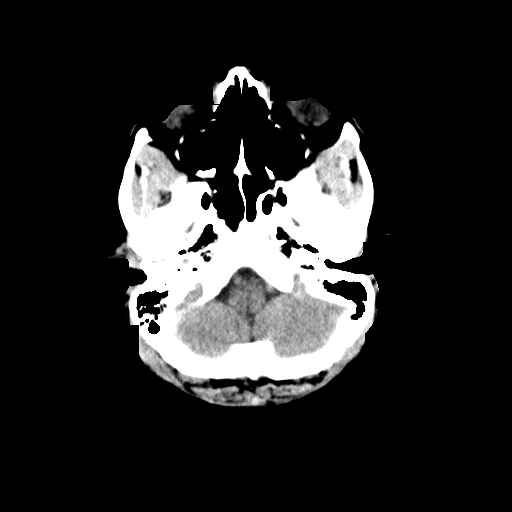
[im 4/30  bone]
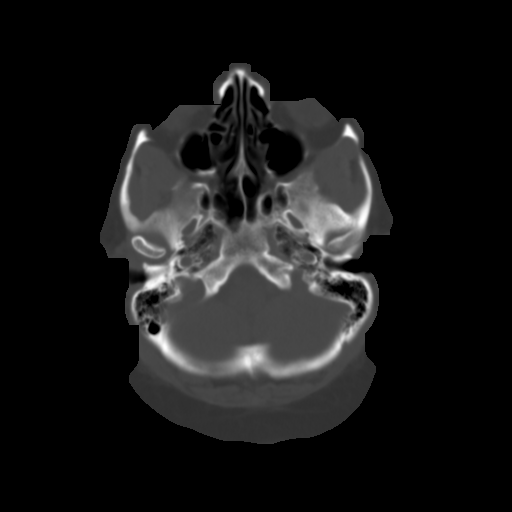
[im 8/30  brain]
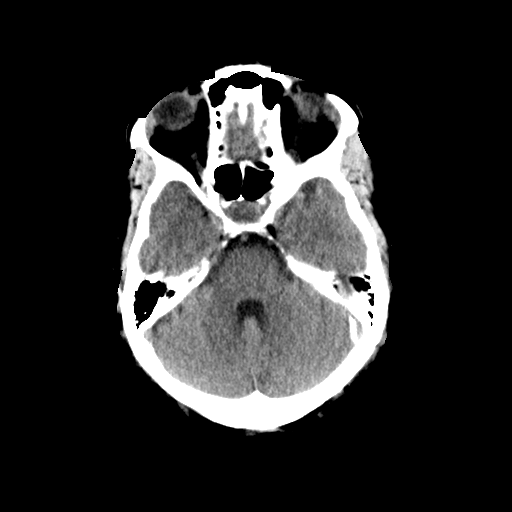
[im 11/30  brain]
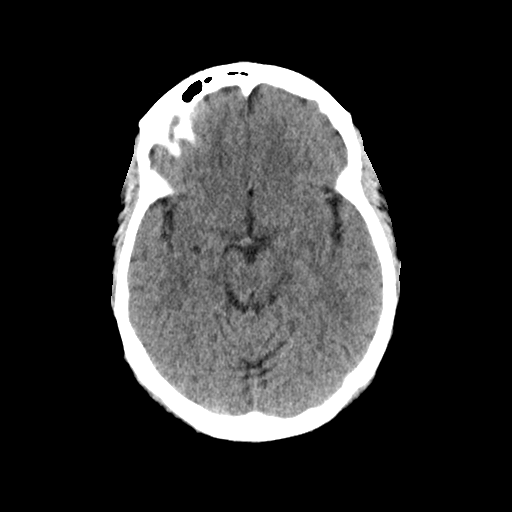
[im 15/30  brain]
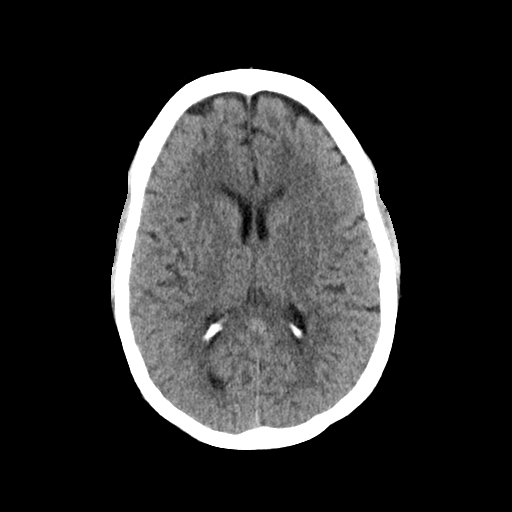
[im 19/30  brain]
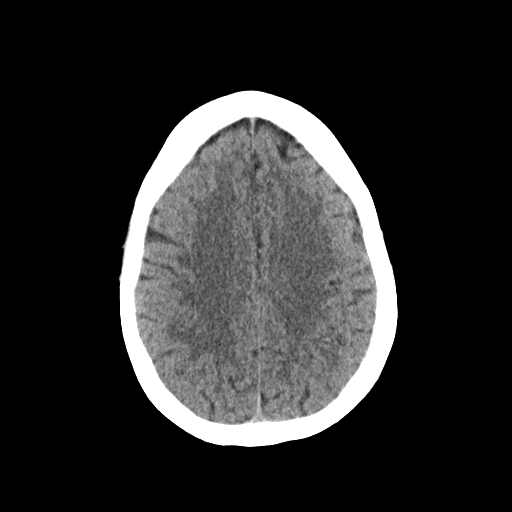
[im 19/30  bone]
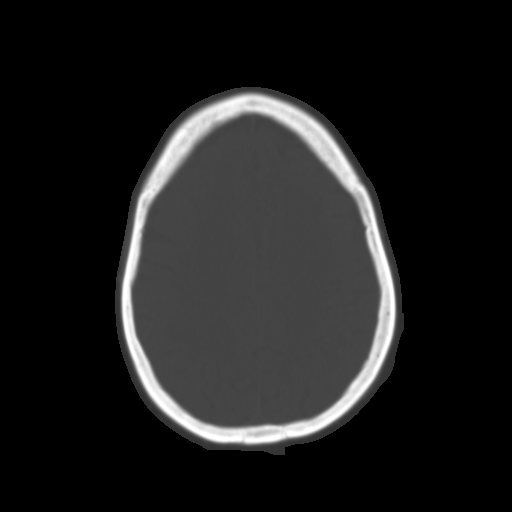
[im 22/30  brain]
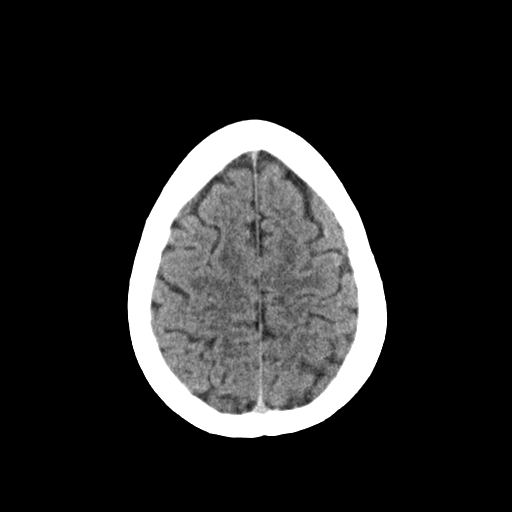
[im 26/30  brain]
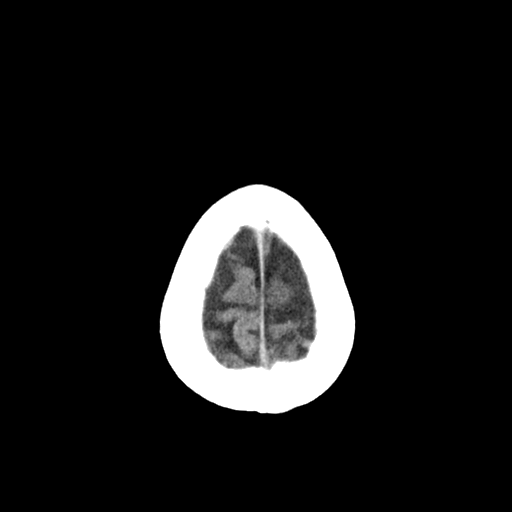

[Series 3: head bone · axial · 0.47mm/px · z∈[-89,-37]mm · 4 of 73 slices shown]
[im 8/73  bone]
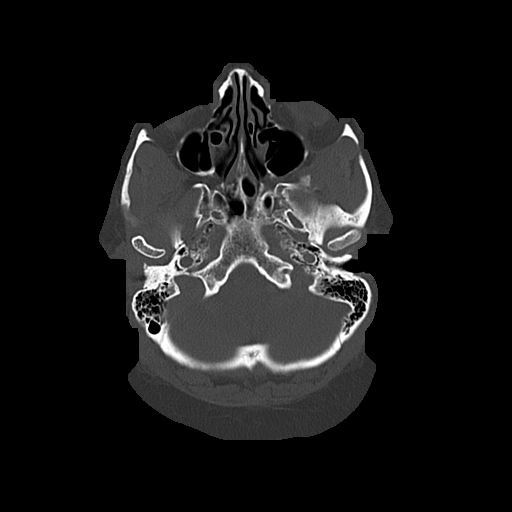
[im 15/73  bone]
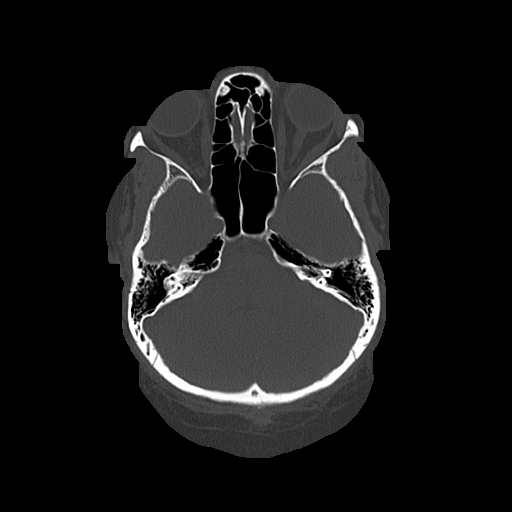
[im 22/73  bone]
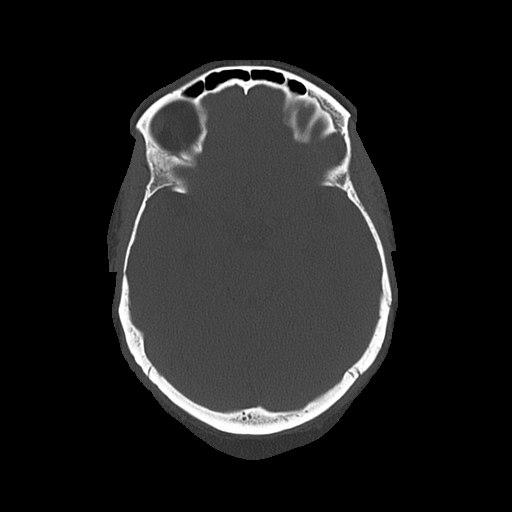
[im 33/73  bone]
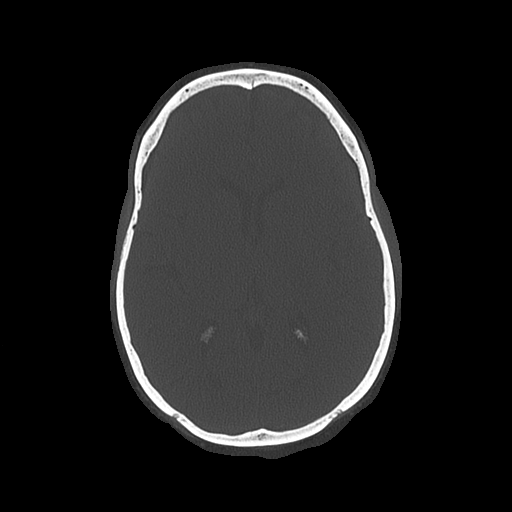

[Series 4: coronal soft · coronal · 0.32mm/px · 3 of 67 slices shown]
[im 23/67  brain]
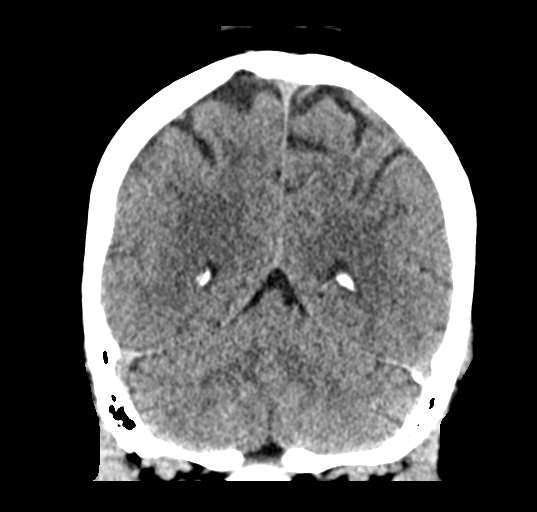
[im 30/67  brain]
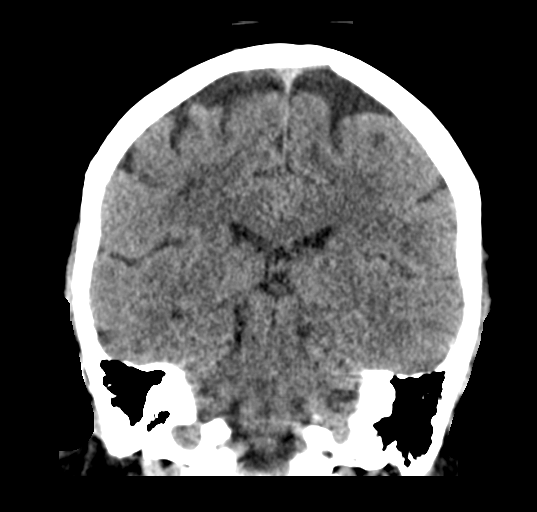
[im 37/67  brain]
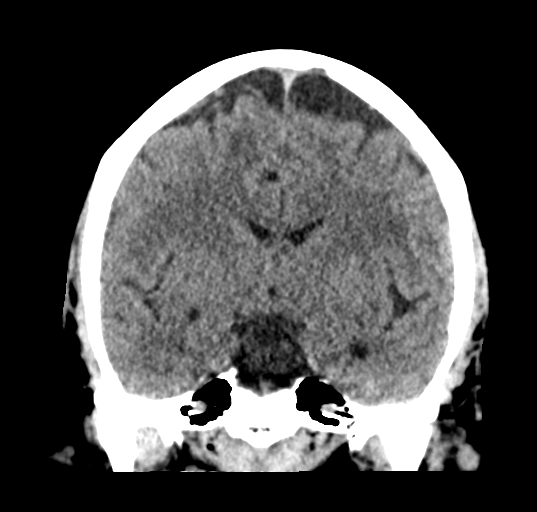

[Series 5: sagittal soft · sagittal · 0.32mm/px · 3 of 55 slices shown]
[im 19/55  brain]
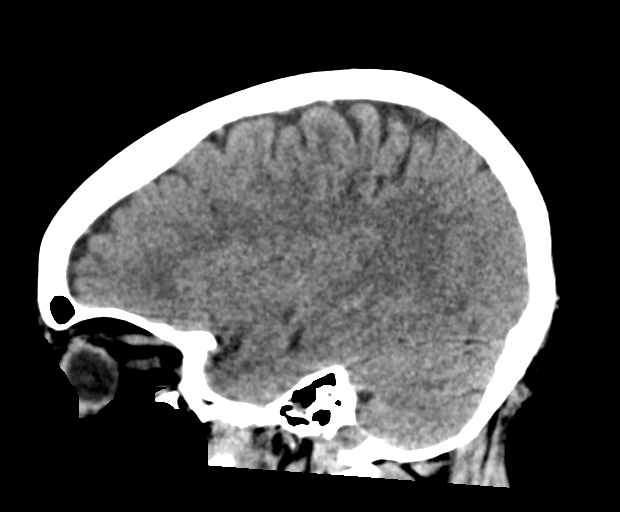
[im 28/55  brain]
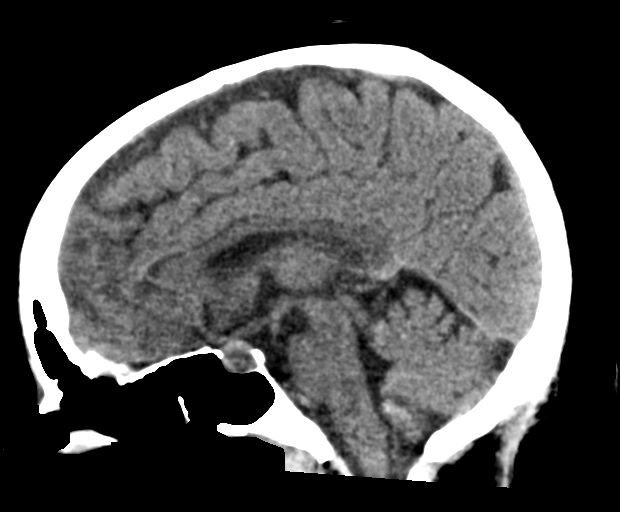
[im 37/55  brain]
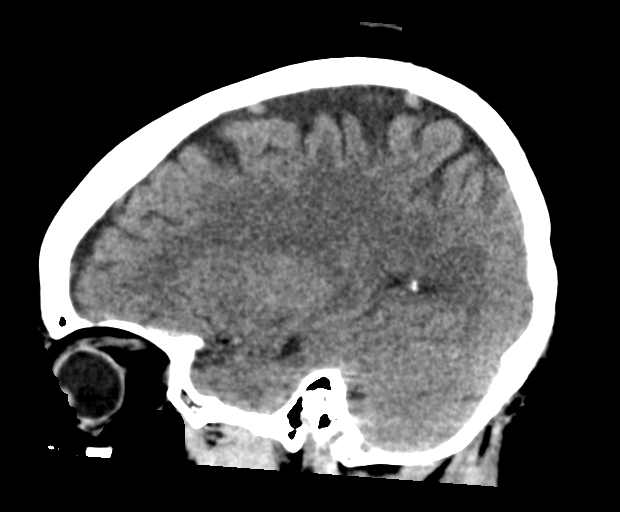

[17 of 47 positions shown; findings below may reference images not displayed]

FINDINGS: Brain: No evidence of acute infarction, hemorrhage, hydrocephalus,
extra-axial collection or mass lesion/mass effect.

Vascular: No hyperdense vessel or unexpected calcification.

Skull: Normal. Negative for fracture or focal lesion.

Sinuses/Orbits: No acute finding.

Other: None.
IMPRESSION: No acute intracranial abnormality.

## 2023-07-16 MED ORDER — METHOCARBAMOL 500 MG PO TABS: 500.0000 mg | ORAL_TABLET | Freq: Three times a day (TID) | ORAL | 0 refills | Status: DC | PRN

## 2023-07-16 NOTE — ED Triage Notes (Signed)
Pt arrives to ED with c/o pain to her back, feet, hands, fingers, ribs, wrist for months. Pt notes that the pain has been getting worse. Pt notes hx of fibromyalgia and her PCP is unable to control her pain.

## 2023-07-16 NOTE — Telephone Encounter (Signed)
Pt states she is having pain, requesting medication for this. Please call pt back.

## 2023-07-16 NOTE — Discharge Instructions (Signed)
You were seen today in the ED.  You can try the prescribed muscle relaxer instead of gabapentin as well as Tylenol and occasional ibuprofen.  You will receive a phone call to schedule follow-up with rheumatology.  If you develop severe pain, chest pain, difficulty breathing or any other new concerning symptoms you should return to the ED.  You should follow-up with your primary care provider as well.

## 2023-07-16 NOTE — ED Provider Notes (Signed)
[place] AT [facility] Provider Note   CSN: [id number] Arrival date & time: 07/16/23  1740     History {Add pertinent medical, surgical, social history, OB history to HPI:1} Chief Complaint  Patient presents with   Pain   Fibromyalgia    SINAE HEISNER is a 57 y.o. female.  HPI     Home Medications Prior to Admission medications   Medication Sig Start Date End Date Taking? Authorizing Provider  atorvastatin (LIPITOR) 10 MG tablet Take 1 tablet (10 mg total) by mouth daily. 06/18/23   Carmina Miller, DO  Blood Glucose Monitoring Suppl (ONETOUCH VERIO) w/Device KIT PLEASE CHECK BLOOD SUGAR 3 TIMES DAILY 09/04/18   Levora Dredge, MD  Blood Glucose Monitoring Suppl (TRUE METRIX METER) w/Device KIT Use as directed 09/14/22     empagliflozin (JARDIANCE) 25 MG TABS tablet Take 1 tablet (25 mg total) by mouth daily. 07/14/23   Carmina Miller, DO  gabapentin (NEURONTIN) 300 MG capsule Take 1 capsule (300 mg total) by mouth at bedtime. 05/29/23   Masters, Katie, DO  glucose blood test strip Please check blood sugars 3 times daily 08/18/22   Mapp, Gaylyn Cheers, MD  Lancets [facility] ULTRASOFT) lancets Please check blood sugars 3 times daily 12/31/18   Levora Dredge, MD  lisinopril-hydrochlorothiazide (ZESTORETIC) 20-25 MG tablet Take 1 tablet by mouth daily. 07/14/23   Carmina Miller, DO  metFORMIN (GLUCOPHAGE-XR) 500 MG 24 hr tablet Take 2 tablets (1,000 mg total) by mouth 2 (two) times daily. 02/28/23   Crissie Sickles, MD  Methocarbamol 1000 MG TABS Take 1 g by mouth in the morning, at noon, and at bedtime. 06/01/23   [name], MD  TRUEplus Lancets 30G MISC Use to check blood sugar 3 (three) times daily. 09/14/22         Allergies    Patient has no known allergies.    Review of Systems   Review of Systems  Physical Exam Updated Vital Signs BP (!) 162/104 (BP Location: Right Arm)   Pulse 74   Temp 98.5 F (36.9 C) (Oral)   Resp 16   Ht 5\' 1"   (1.549 m)   Wt 90.7 kg   LMP 03/04/2014   SpO2 100%   BMI 37.79 kg/m  Physical Exam  ED Results / Procedures / Treatments   Labs (all labs ordered are listed, but only abnormal results are displayed) Labs Reviewed - No data to display  EKG None  Radiology No results found.  Procedures Procedures  {Document cardiac monitor, telemetry assessment procedure when appropriate:1}  Medications Ordered in ED Medications - No data to display  ED Course/ Medical Decision Making/ A&P   {   Click here for ABCD2, HEART and other calculatorsREFRESH Note before signing :1}                              Medical Decision Making  ***  {Document critical care time when appropriate:1} {Document review of labs and clinical decision tools ie heart score, Chads2Vasc2 etc:1}  {Document your independent review of radiology images, and any outside records:1} {Document your discussion with family members, caretakers, and with consultants:1} {Document social determinants of health affecting pt's care:1} {Document your decision making why or why not admission, treatments were needed:1} Final Clinical Impression(s) / ED Diagnoses Final diagnoses:  None    Rx / DC Orders ED Discharge Orders     None

## 2023-07-16 NOTE — Telephone Encounter (Signed)
Pt called again in regards to he pain and stated that her pcp did not call her ... She stated that the pain has gotten worse  she has taken the meds as directed and it is not working  and she has even gone as far as to  take extra ... I explained that message was routed to her PCP  and if she  is in so much pain to go ahead and  head to the urgent care.. pt  also went ahead and made urgent care f/u

## 2023-07-16 NOTE — ED Notes (Signed)
 RN reviewed discharge instructions with pt. Pt verbalized understanding and had no further questions. VSS upon discharge.  

## 2023-07-17 ENCOUNTER — Telehealth: Payer: Self-pay | Admitting: Student

## 2023-07-17 ENCOUNTER — Other Ambulatory Visit (HOSPITAL_COMMUNITY): Payer: Self-pay

## 2023-07-17 MED ORDER — METFORMIN HCL ER 500 MG PO TB24
1000.0000 mg | ORAL_TABLET | Freq: Two times a day (BID) | ORAL | 2 refills | Status: DC
Start: 2023-07-17 — End: 2023-10-29
  Filled 2023-07-17: qty 120, 30d supply, fill #0
  Filled 2023-08-17: qty 120, 30d supply, fill #1
  Filled 2023-09-25: qty 120, 30d supply, fill #2

## 2023-07-17 NOTE — Telephone Encounter (Signed)
Pt requesting a Referral to Rheumatology Per her Ed visit on yesterday 07/16/2023.  Will the patient need to be seen by [facility] before the referral can be sent?

## 2023-07-18 ENCOUNTER — Other Ambulatory Visit (HOSPITAL_COMMUNITY): Payer: Self-pay

## 2023-07-18 ENCOUNTER — Other Ambulatory Visit: Payer: Self-pay

## 2023-07-20 ENCOUNTER — Telehealth: Payer: Self-pay

## 2023-07-20 NOTE — Telephone Encounter (Signed)
Pt is requesting a call back ... She stated she is in pain  and is in need to speak to someone

## 2023-07-20 NOTE — Telephone Encounter (Signed)
Return pt's call who's c/o fibromyalgia pain. States Robaxin and Gabapentin are not helping; unable to sleep. She went to [facility] 8/26. 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 Pt's telephone# [phone]. Thanks

## 2023-07-26 DIAGNOSIS — N950 Postmenopausal bleeding: Secondary | ICD-10-CM | POA: Diagnosis not present

## 2023-07-26 DIAGNOSIS — N760 Acute vaginitis: Secondary | ICD-10-CM | POA: Diagnosis not present

## 2023-07-31 ENCOUNTER — Other Ambulatory Visit (HOSPITAL_COMMUNITY): Payer: Self-pay

## 2023-07-31 ENCOUNTER — Ambulatory Visit (INDEPENDENT_AMBULATORY_CARE_PROVIDER_SITE_OTHER): Payer: BC Managed Care – PPO | Admitting: Student

## 2023-07-31 VITALS — BP 121/93 | HR 84 | Temp 97.9°F | Resp 20 | Ht 61.0 in | Wt 205.1 lb

## 2023-07-31 DIAGNOSIS — M797 Fibromyalgia: Secondary | ICD-10-CM | POA: Diagnosis not present

## 2023-07-31 DIAGNOSIS — Z7984 Long term (current) use of oral hypoglycemic drugs: Secondary | ICD-10-CM

## 2023-07-31 DIAGNOSIS — Z0000 Encounter for general adult medical examination without abnormal findings: Secondary | ICD-10-CM

## 2023-07-31 DIAGNOSIS — E119 Type 2 diabetes mellitus without complications: Secondary | ICD-10-CM

## 2023-07-31 LAB — POCT GLYCOSYLATED HEMOGLOBIN (HGB A1C): Hemoglobin A1C: 10.4 % — AB (ref 4.0–5.6)

## 2023-07-31 LAB — GLUCOSE, CAPILLARY: Glucose-Capillary: 183 mg/dL — ABNORMAL HIGH (ref 70–99)

## 2023-07-31 MED ORDER — METRONIDAZOLE 500 MG PO TABS
500.0000 mg | ORAL_TABLET | Freq: Two times a day (BID) | ORAL | 0 refills | Status: DC
  Filled 2023-07-31: qty 14, 7d supply, fill #0

## 2023-07-31 MED ORDER — CYCLOBENZAPRINE HCL 10 MG PO TABS
10.0000 mg | ORAL_TABLET | Freq: Every evening | ORAL | 0 refills | Status: DC
Start: 2023-07-31 — End: 2023-07-31

## 2023-07-31 MED ORDER — SAVELLA 12.5 MG PO TABS
1.0000 | ORAL_TABLET | Freq: Every day | ORAL | 0 refills | Status: AC
Start: 2023-07-31 — End: 2023-09-07
  Filled 2023-07-31: qty 30, 30d supply, fill #0

## 2023-07-31 MED ORDER — SAVELLA 12.5 MG PO TABS
1.0000 | ORAL_TABLET | Freq: Every day | ORAL | 0 refills | Status: DC
Start: 2023-07-31 — End: 2023-07-31

## 2023-07-31 MED ORDER — CYCLOBENZAPRINE HCL 10 MG PO TABS
10.0000 mg | ORAL_TABLET | Freq: Every evening | ORAL | 0 refills | Status: AC
Start: 2023-07-31 — End: 2023-08-10
  Filled 2023-07-31 (×2): qty 10, 10d supply, fill #0

## 2023-07-31 NOTE — Assessment & Plan Note (Signed)
Patient noted to be getting her Pap smears by her OB/GYN.  She has an appointment in November and will receive it at this time.  She has never received a colonoscopy so we put in a referral for this.

## 2023-07-31 NOTE — Assessment & Plan Note (Signed)
Initially noted to have pain in hands and fingers but this has progressed to diffuse pain with the patient is driving her car, wearing her seatbelt, walking, and reaching for items.  She describes the pain as throbbing in her legs and arms.  She was previously prescribed duloxetine but stopped as it made her nauseous.  She was also taking pregabalin but stopped as this gave her suicidal ideation.  She was prescribed gabapentin which has helped with her sleep but she still remains in pain. - Begin milnicaprin once approved - 10 tablets of Flexeril until milnicaprin is approved  - Follow-up BMP

## 2023-07-31 NOTE — Progress Notes (Signed)
CC: Finger & Hand Pain   HPI: [name] is a 57 y.o. female living with a history stated below and presents today for finger and hand pain . Please see problem based assessment and plan for additional details.   Past Medical History:  Diagnosis Date   Agatston coronary artery calcium score less than 100    6.3 coronary Ca score 11/2021 CT   Allergic rhinitis    Anxiety    Asthma    Required inhaled corticosteroid in past.    Benign paroxysmal positional vertigo, unspecified laterality    Body mass index (BMI) 40.0-44.9, adult (HCC)    Cervical radiculopathy    Daily headache    Depression, major 02/25/2013   First episode that required was 2014. Prior episodes that did not require pt to seek medical tx.   DM (diabetes mellitus) (HCC)    Fibromyalgia    Hyperlipidemia    Hypertension    2 drug therapy   Left knee pain 01/15/2020   Low back pain 08/10/2016   Morbid obesity due to excess calories (HCC)    Nausea 09/19/2018   OSA (obstructive sleep apnea)    Sleep study 12/2006 : Moderately Severe OSA. AHI 38.8. O2 sat decreased to 71%.   Primary insomnia    Severe obesity (BMI >= 40) (HCC) 10/08/2012   Obesity Class 3. BMI > 40.    Strain of flexor muscle of right hip 09/19/2018   Tobacco abuse    Urge incontinence     Current Outpatient Medications on File Prior to Visit  Medication Sig Dispense Refill   atorvastatin (LIPITOR) 10 MG tablet Take 1 tablet (10 mg total) by mouth daily. 90 tablet 3   Blood Glucose Monitoring Suppl (ONETOUCH VERIO) w/Device KIT PLEASE CHECK BLOOD SUGAR 3 TIMES DAILY 1 kit 0   Blood Glucose Monitoring Suppl (TRUE METRIX METER) w/Device KIT Use as directed 1 kit 0   empagliflozin (JARDIANCE) 25 MG TABS tablet Take 1 tablet (25 mg total) by mouth daily. 30 tablet 2   gabapentin (NEURONTIN) 300 MG capsule Take 1 capsule (300 mg total) by mouth at bedtime. 90 capsule 3   glucose blood test strip Please check blood sugars 3 times daily  100 each 1   Lancets (ONETOUCH ULTRASOFT) lancets Please check blood sugars 3 times daily 100 each 12   lisinopril-hydrochlorothiazide (ZESTORETIC) 20-25 MG tablet Take 1 tablet by mouth daily. 30 tablet 2   metFORMIN (GLUCOPHAGE-XR) 500 MG 24 hr tablet Take 2 tablets (1,000 mg total) by mouth 2 (two) times daily. 120 tablet 2   methocarbamol (ROBAXIN) 500 MG tablet Take 1 tablet (500 mg total) by mouth 3 (three) times daily as needed for muscle spasms. 20 tablet 0   TRUEplus Lancets 30G MISC Use to check blood sugar 3 (three) times daily. 100 each 0   No current facility-administered medications on file prior to visit.    Family History  Problem Relation Age of Onset   Diabetes Mother    Hypertension Mother    Arthritis Mother    Hyperlipidemia Mother    Hypertension Father    Stroke Father        in his 24's   Heart disease Father        died <47 y.o   Heart disease Sister    Heart disease Brother    Heart disease Maternal Grandmother    Diabetes Maternal Aunt    Cancer Maternal Aunt  breast    Breast cancer Maternal Aunt 60   Asthma Maternal Aunt     Social History   Socioeconomic History   Marital status: Single    Spouse name: Not on file   Number of children: 3   Years of education: some coll.   Highest education level: Not on file  Occupational History   Occupation: Claims processing    Employer: [employer]  Tobacco Use   Smoking status: Former    Current packs/day: 0.00    Average packs/day: (0.9 ttl pk-yrs)    Types: Cigarettes    Start date: 05/20/1985    Quit date: 05/21/2015    Years since quitting: 8.2    Passive exposure: Never   Smokeless tobacco: Never  Vaping Use   Vaping status: Never Used  Substance and Sexual Activity   Alcohol use: Not Currently    Alcohol/week: 0.0 standard drinks of alcohol    Comment: rarely.   Drug use: No   Sexual activity: Not on file  Other Topics Concern   Not on file  Social History Narrative    Lives in [place] alone. 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   Social Determinants of Health   Financial Resource Strain: Not on file  Food Insecurity: No Food Insecurity (07/31/2023)   Hunger Vital Sign    Worried About Running Out of Food in the Last Year: Never true    Ran Out of Food in the Last Year: Never true  Transportation Needs: No Transportation Needs (07/31/2023)   PRAPARE - Administrator, Civil Service (Medical): No    Lack of Transportation (Non-Medical): No  Physical Activity: Not on file  Stress: Not on file  Social Connections: Not on file  Intimate Partner Violence: Not At Risk (07/31/2023)   Humiliation, Afraid, Rape, and Kick questionnaire    Fear of Current or Ex-Partner: No    Emotionally Abused: No    Physically Abused: No    Sexually Abused: No    Review of Systems: ROS negative except for what is noted on the assessment and plan.  Vitals:   07/31/23 0903 07/31/23 0942  BP: (!) 124/94 (!) 121/93  Pulse: 92 84  Resp: 20   Temp: 97.9 F (36.6 C)   TempSrc: Oral   SpO2: 99%   Weight: 205 lb 1.6 oz (93 kg)   Height: 5\' 1"  (1.549 m)     Physical Exam: Constitutional: well-appearing in no acute distress HENT: normocephalic atraumatic, mucous membranes moist Eyes: conjunctiva non-erythematous Neck: supple Cardiovascular: regular rate and rhythm, no m/r/g Pulmonary/Chest: normal work of breathing on room air, lungs clear to auscultation bilaterally Abdominal: soft, non-tender, non-distended MSK: normal bulk and tone Neurological: alert & oriented x 3, 5/5 strength in bilateral upper and lower extremities, normal gait Skin: warm and dry   Assessment & Plan:   Fibromyalgia Initially noted to have pain in hands and fingers but this has progressed to diffuse pain with the patient is driving her car, wearing her seatbelt, walking, and reaching for items.  She describes the pain as throbbing in her legs and arms.  She was previously  prescribed duloxetine but stopped as it made her nauseous.  She was also taking pregabalin but stopped as this gave her suicidal ideation.  She was prescribed gabapentin which has helped with her sleep but she still remains in pain. - Begin milnicaprin once approved - 10 tablets of Flexeril until milnicaprin is approved  -  Follow-up BMP  Diabetes mellitus (HCC) Patient noted that she has not taken her Jardiance and metformin every day.  Last time she took her glucose was 2 weeks ago and was around 100.  She has been experiencing no side effects of metformin or Jardiance.  Follow-up in 2 weeks and consider altering medication regimen pending A1c values. - Continue Jardiance and metformin - Follow-up A1c and glucose  Healthcare maintenance Patient noted to be getting her Pap smears by her OB/GYN.  She has an appointment in November and will receive it at this time.  She has never received a colonoscopy so we put in a referral for this.   Patient seen with Dr. Henderson Newcomer, MD  [facility] Internal Medicine, PGY-1 Date 07/31/2023 Time 12:24 PM

## 2023-07-31 NOTE — Patient Instructions (Addendum)
Thank you so much for coming to the clinic today!   Please start taking the flexeril once daily at night as we wait for the approval for your [facility]. I have also put in a referral to GI for your colonoscopy.   If you have any questions please feel free to the call the clinic at anytime at [phone]. It was a pleasure seeing you!  Best, Dr. Rayvon Char

## 2023-07-31 NOTE — Telephone Encounter (Signed)
Pt had an appt this am.

## 2023-07-31 NOTE — Assessment & Plan Note (Signed)
Patient noted that she has not taken her Jardiance and metformin every day.  Last time she took her glucose was 2 weeks ago and was around 100.  She has been experiencing no side effects of metformin or Jardiance.  Follow-up in 2 weeks and consider altering medication regimen pending A1c values. - Continue Jardiance and metformin - Follow-up A1c and glucose

## 2023-08-01 ENCOUNTER — Other Ambulatory Visit (HOSPITAL_COMMUNITY): Payer: Self-pay

## 2023-08-01 LAB — BMP8+ANION GAP
Anion Gap: 17 mmol/L (ref 10.0–18.0)
BUN/Creatinine Ratio: 15 (ref 9–23)
BUN: 14 mg/dL (ref 6–24)
CO2: 23 mmol/L (ref 20–29)
Calcium: 9.4 mg/dL (ref 8.7–10.2)
Chloride: 99 mmol/L (ref 96–106)
Creatinine, Ser: 0.93 mg/dL (ref 0.57–1.00)
Glucose: 183 mg/dL — ABNORMAL HIGH (ref 70–99)
Potassium: 4.3 mmol/L (ref 3.5–5.2)
Sodium: 139 mmol/L (ref 134–144)
eGFR: 71 mL/min/{1.73_m2} (ref >59)

## 2023-08-01 NOTE — Progress Notes (Signed)
Called patient to discuss A1c and BMP. BMP normal with exception of elevated glucose. Patient made aware of elevated A1c. She notes that she sometimes forgets to take her meds. She went a 1.5 week period without taking her Jardiance. Patient was educated on importance of healthy lifestyle modifications and medication adherence. Patient was educated that her medication regimen will likely be adjusted at her next visit in ~2 weeks.

## 2023-08-06 ENCOUNTER — Other Ambulatory Visit (HOSPITAL_COMMUNITY): Payer: Self-pay

## 2023-08-06 MED ORDER — METRONIDAZOLE 0.75 % VA GEL
1.0000 | Freq: Every day | VAGINAL | 0 refills | Status: DC
  Filled 2023-08-06: qty 70, 5d supply, fill #0
  Filled 2023-08-10: qty 70, 7d supply, fill #0

## 2023-08-08 ENCOUNTER — Other Ambulatory Visit (HOSPITAL_COMMUNITY): Payer: Self-pay

## 2023-08-10 ENCOUNTER — Other Ambulatory Visit (HOSPITAL_COMMUNITY): Payer: Self-pay

## 2023-08-13 ENCOUNTER — Other Ambulatory Visit (HOSPITAL_COMMUNITY): Payer: Self-pay

## 2023-08-13 ENCOUNTER — Telehealth: Payer: Self-pay | Admitting: *Deleted

## 2023-08-13 MED ORDER — METHOCARBAMOL 500 MG PO TABS
500.0000 mg | ORAL_TABLET | Freq: Two times a day (BID) | ORAL | 0 refills | Status: AC | PRN
  Filled 2023-08-13: qty 28, 14d supply, fill #0

## 2023-08-13 NOTE — Telephone Encounter (Signed)
I spoke with Crystal English on the phone. Patient's identity was confirmed using two patient specific identifiers. We discussed the pain she is having. Bilateral shoulder and arm going into her upper chest and neck. Throbbing pain radiating upwards. 5/10 at worst. Comes on randomly, non-anginal, no SOB. Endorses hot flashes, night sweats. No syncope or pre-syncope. Flexeril helped her a bit, especially with sleep. However, it is not recommended to take Flexeril and Savella at the same time. Instead we will try Robaxin, which she has taken in the past, and ask that she follow up with [place] in the clinic. This case was discussed with Dr. Lafonda Mosses.

## 2023-08-13 NOTE — Telephone Encounter (Signed)
Received a call from the pt with hx of Fibromyalgia and was started on new med Savella ; stated it does not help the pain and she's unable to sleep at night. Stated she has been on Lyrica, Symbalta. Stated Gabapentin does not help w/the pain but at least she's able to sleep. Stated she was in the bed all w/e d/t pain in her arms and shoulders which radiates to her chest and neck. She wants to know if something else might be going on b/c of the pain she's having.  No available appts this week. Thanks

## 2023-08-14 NOTE — Addendum Note (Signed)
Addended by: Debe Coder B on: 08/14/2023 02:25 PM   Modules accepted: Level of Service

## 2023-08-14 NOTE — Progress Notes (Signed)
Internal Medicine Clinic Attending  I was physically present during the key portions of the resident provided service and participated in the medical decision making of patient's management care. I reviewed pertinent patient test results.  The assessment, diagnosis, and plan were formulated together and I agree with the documentation in the resident's note.  If new medication is not approved, will need to discuss further medications for her fibromyalgia such as a TCA or other.   Inez Catalina, MD

## 2023-08-17 ENCOUNTER — Other Ambulatory Visit (HOSPITAL_COMMUNITY): Payer: Self-pay

## 2023-08-17 ENCOUNTER — Other Ambulatory Visit: Payer: Self-pay

## 2023-08-17 DIAGNOSIS — L292 Pruritus vulvae: Secondary | ICD-10-CM | POA: Diagnosis not present

## 2023-08-17 DIAGNOSIS — N760 Acute vaginitis: Secondary | ICD-10-CM | POA: Diagnosis not present

## 2023-08-17 DIAGNOSIS — N393 Stress incontinence (female) (male): Secondary | ICD-10-CM | POA: Diagnosis not present

## 2023-08-17 DIAGNOSIS — N819 Female genital prolapse, unspecified: Secondary | ICD-10-CM | POA: Diagnosis not present

## 2023-08-17 MED ORDER — NYSTATIN-TRIAMCINOLONE 100000-0.1 UNIT/GM-% EX OINT
1.0000 | TOPICAL_OINTMENT | Freq: Two times a day (BID) | CUTANEOUS | 1 refills | Status: DC | PRN
Start: 2023-08-17 — End: 2023-10-10
  Filled 2023-08-17: qty 30, 15d supply, fill #0

## 2023-08-17 MED ORDER — NYSTATIN 100000 UNIT/GM EX OINT
1.0000 | TOPICAL_OINTMENT | Freq: Two times a day (BID) | CUTANEOUS | 0 refills | Status: DC
Start: 2023-08-17 — End: 2023-10-10
  Filled 2023-08-17: qty 30, 15d supply, fill #0

## 2023-08-17 MED ORDER — TRIAMCINOLONE ACETONIDE 0.1 % EX OINT
1.0000 | TOPICAL_OINTMENT | Freq: Two times a day (BID) | CUTANEOUS | 0 refills | Status: DC
Start: 2023-08-17 — End: 2023-10-10
  Filled 2023-08-17: qty 15, 8d supply, fill #0

## 2023-08-17 MED ORDER — FLUCONAZOLE 150 MG PO TABS
150.0000 mg | ORAL_TABLET | ORAL | 0 refills | Status: DC
  Filled 2023-08-17: qty 2, 3d supply, fill #0

## 2023-08-20 ENCOUNTER — Other Ambulatory Visit: Payer: Self-pay

## 2023-08-20 ENCOUNTER — Other Ambulatory Visit (HOSPITAL_COMMUNITY): Payer: Self-pay

## 2023-08-23 ENCOUNTER — Other Ambulatory Visit (HOSPITAL_COMMUNITY): Payer: Self-pay

## 2023-08-23 ENCOUNTER — Telehealth: Payer: Self-pay

## 2023-08-23 DIAGNOSIS — M797 Fibromyalgia: Secondary | ICD-10-CM

## 2023-08-23 MED ORDER — CYCLOBENZAPRINE HCL 5 MG PO TABS
5.0000 mg | ORAL_TABLET | Freq: Three times a day (TID) | ORAL | 0 refills | Status: DC | PRN
  Filled 2023-08-23: qty 30, 10d supply, fill #0

## 2023-08-23 NOTE — Telephone Encounter (Signed)
Requesting to speak with a nurse about Milnacipran HCl (SAVELLA) 12.5 MG TABS. Pt states this medication cause her to have dry mouth, unable to sleep, constipation and sweating. Pt states she has stopped taking this medication. Requesting a call back.

## 2023-08-23 NOTE — Telephone Encounter (Signed)
Spoke to the patient on the phone about the side effects she experienced after starting milnacipran which included significant insomnia.  In that time she had resume taking her gabapentin 300 mg at night for the first time last night and slept quite well.  She also saw some benefit on the cyclobenzaprine 10 mg which she has taken during the day without significant sedation.  We discussed her history of fibromyalgia and the medications that she has tried so far being all of the first-line medications for fibromyalgia.  I discussed acupuncture with her and she is willing to explore this option.  In the meantime she will continue with 300 mg gabapentin at night and I will send in a prescription of cyclobenzaprine 5 mg which she can use every 8 hours during the day.  She was advised not to take the cyclobenzaprine and gabapentin together.  She will also come in for the next available visit to reevaluate how she is doing on these medications and to help with further treatment.

## 2023-08-23 NOTE — Addendum Note (Signed)
Addended by: Rocky Morel on: 08/23/2023 05:50 PM   Modules accepted: Orders

## 2023-08-24 ENCOUNTER — Other Ambulatory Visit (HOSPITAL_COMMUNITY): Payer: Self-pay

## 2023-08-31 ENCOUNTER — Encounter: Payer: Self-pay | Admitting: Internal Medicine

## 2023-09-05 ENCOUNTER — Telehealth: Payer: Self-pay | Admitting: Student

## 2023-09-05 NOTE — Telephone Encounter (Signed)
Opened in error

## 2023-09-13 ENCOUNTER — Other Ambulatory Visit: Payer: Self-pay | Admitting: Student

## 2023-09-13 DIAGNOSIS — M797 Fibromyalgia: Secondary | ICD-10-CM

## 2023-09-14 ENCOUNTER — Other Ambulatory Visit: Payer: Self-pay

## 2023-09-17 ENCOUNTER — Other Ambulatory Visit (HOSPITAL_COMMUNITY): Payer: Self-pay

## 2023-09-17 MED ORDER — CYCLOBENZAPRINE HCL 5 MG PO TABS
5.0000 mg | ORAL_TABLET | Freq: Three times a day (TID) | ORAL | 0 refills | Status: DC | PRN
  Filled 2023-09-17: qty 30, 10d supply, fill #0

## 2023-09-25 ENCOUNTER — Other Ambulatory Visit (HOSPITAL_COMMUNITY): Payer: Self-pay

## 2023-09-25 ENCOUNTER — Other Ambulatory Visit: Payer: Self-pay | Admitting: Student

## 2023-09-25 ENCOUNTER — Other Ambulatory Visit: Payer: Self-pay

## 2023-09-25 DIAGNOSIS — M797 Fibromyalgia: Secondary | ICD-10-CM

## 2023-09-26 MED ORDER — CYCLOBENZAPRINE HCL 5 MG PO TABS
5.0000 mg | ORAL_TABLET | Freq: Three times a day (TID) | ORAL | 0 refills | Status: DC | PRN
Start: 2023-09-26 — End: 2024-07-08

## 2023-09-27 DIAGNOSIS — M25512 Pain in left shoulder: Secondary | ICD-10-CM | POA: Diagnosis not present

## 2023-09-27 DIAGNOSIS — M25511 Pain in right shoulder: Secondary | ICD-10-CM | POA: Diagnosis not present

## 2023-10-10 ENCOUNTER — Other Ambulatory Visit (HOSPITAL_COMMUNITY): Payer: Self-pay

## 2023-10-10 ENCOUNTER — Ambulatory Visit (INDEPENDENT_AMBULATORY_CARE_PROVIDER_SITE_OTHER): Payer: BC Managed Care – PPO | Admitting: Obstetrics

## 2023-10-10 ENCOUNTER — Encounter: Payer: Self-pay | Admitting: Obstetrics

## 2023-10-10 VITALS — BP 115/96 | HR 96 | Ht 61.77 in | Wt 204.8 lb

## 2023-10-10 DIAGNOSIS — N8110 Cystocele, unspecified: Secondary | ICD-10-CM | POA: Insufficient documentation

## 2023-10-10 DIAGNOSIS — Z6835 Body mass index (BMI) 35.0-35.9, adult: Secondary | ICD-10-CM

## 2023-10-10 DIAGNOSIS — Z6838 Body mass index (BMI) 38.0-38.9, adult: Secondary | ICD-10-CM | POA: Diagnosis not present

## 2023-10-10 DIAGNOSIS — Z124 Encounter for screening for malignant neoplasm of cervix: Secondary | ICD-10-CM | POA: Diagnosis not present

## 2023-10-10 DIAGNOSIS — Z01419 Encounter for gynecological examination (general) (routine) without abnormal findings: Secondary | ICD-10-CM | POA: Diagnosis not present

## 2023-10-10 DIAGNOSIS — R351 Nocturia: Secondary | ICD-10-CM | POA: Insufficient documentation

## 2023-10-10 DIAGNOSIS — K5900 Constipation, unspecified: Secondary | ICD-10-CM | POA: Insufficient documentation

## 2023-10-10 DIAGNOSIS — Z1151 Encounter for screening for human papillomavirus (HPV): Secondary | ICD-10-CM | POA: Diagnosis not present

## 2023-10-10 DIAGNOSIS — N3941 Urge incontinence: Secondary | ICD-10-CM | POA: Diagnosis not present

## 2023-10-10 LAB — POCT URINALYSIS DIPSTICK
Bilirubin, UA: NEGATIVE
Blood, UA: NEGATIVE
Glucose, UA: POSITIVE — AB
Ketones, UA: NEGATIVE
Leukocytes, UA: NEGATIVE
Nitrite, UA: NEGATIVE
Protein, UA: NEGATIVE
Spec Grav, UA: 1.01 (ref 1.010–1.025)
Urobilinogen, UA: 0.2 U/dL — AB
pH, UA: 6 (ref 5.0–8.0)

## 2023-10-10 MED ORDER — GEMTESA 75 MG PO TABS
75.0000 mg | ORAL_TABLET | Freq: Every day | ORAL | Status: DC
Start: 2023-10-10 — End: 2024-07-08

## 2023-10-10 MED ORDER — GEMTESA 75 MG PO TABS
75.0000 mg | ORAL_TABLET | Freq: Every day | ORAL | 2 refills | Status: DC
Start: 2023-10-10 — End: 2024-07-08
  Filled 2023-10-10 – 2023-12-02 (×3): qty 30, 30d supply, fill #0
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
## 2023-10-10 NOTE — Assessment & Plan Note (Addendum)
-   encouraged weight loss due to prior success with exercise routine and diet modification  - discussed impact on glycemic control and pelvic floor symptoms

## 2023-10-10 NOTE — Assessment & Plan Note (Addendum)
-   For treatment of pelvic organ prolapse, we discussed options for management including expectant management, conservative management, and surgical management, such as Kegels, a pessary, pelvic floor physical therapy, and specific surgical procedures. - encouraged to start Kegel exercises  - encouraged weight loss due to prior success with exercise routine and diet modification  - discussed HbA1C goal < 8 prior to surgical intervention, encouraged follow-up with PCP to repeat HbA1C, last 10.4 on 07/31/23

## 2023-10-10 NOTE — Assessment & Plan Note (Addendum)
-   Bristol I stool despite max dose metformin 2000mg /day - For constipation, we reviewed the importance of a better bowel regimen.  We also discussed the importance of avoiding chronic straining, as it can exacerbate her pelvic floor symptoms; we discussed treating constipation and straining prior to surgery, as postoperative straining can lead to damage to the repair and recurrence of symptoms. We discussed initiating therapy with increasing fluid intake, fiber supplementation, stool softeners, and laxatives such as miralax.  - start fiber supplementation

## 2023-10-10 NOTE — Assessment & Plan Note (Signed)
For night time frequency: - avoid fluid intake after 6pm - elevated your feet during the day or use compression socks to reduce lower extremity swelling - consider resuming CPAP for sleep apnea

## 2023-10-10 NOTE — Assessment & Plan Note (Addendum)
-   POCT + glucose, bladder scan 55mL - discussed impact of suboptimal glycemic control and urinary symptoms, reviewed glucose in urine  - We discussed the symptoms of overactive bladder (OAB), which include urinary urgency, urinary frequency, nocturia, with or without urge incontinence.  While we do not know the exact etiology of OAB, several treatment options exist. We discussed management including behavioral therapy (decreasing bladder irritants, urge suppression strategies, timed voids, bladder retraining), physical therapy, medication; for refractory cases posterior tibial nerve stimulation, sacral neuromodulation, and intravesical botulinum toxin injection.  For anticholinergic medications, we discussed the potential side effects of anticholinergics including dry eyes, dry mouth, constipation, cognitive impairment and urinary retention. For Beta-3 agonist medication, we discussed the potential side effect of elevated blood pressure which is more likely to occur in individuals with uncontrolled hypertension. - samples and Rx provided for Gemtesa - encouraged bladder training, fluid management, and glycemic control. Discussed association of hyperglycemic with urinary symptoms.

## 2023-10-10 NOTE — Patient Instructions (Addendum)
We discussed the symptoms of overactive bladder (OAB), which include urinary urgency, urinary frequency, night-time urination, with or without urge incontinence.  We discussed management including behavioral therapy (decreasing bladder irritants by following a bladder diet, urge suppression strategies, timed voids, bladder retraining), physical therapy, medication; and for refractory cases posterior tibial nerve stimulation, sacral neuromodulation, and intravesical botulinum toxin injection.   For Beta-3 agonist medication, we discussed the potential side effect of elevated blood pressure which is more likely to occur in individuals with uncontrolled hypertension. You were given samples for Gemtesa 75 mg.  It can take a month to start working so give it time, but if you have bothersome side effects call sooner and we can try a different medication.  Call us if you have trouble filling the prescription or if it's not covered by your insurance.  For night time frequency: - avoid fluid intake after 6pm - elevated your feet during the day or use compression socks to reduce lower extremity swelling - consider resuming CPAP for sleep apnea  You have a stage 2 (out of 4) prolapse.  We discussed the fact that it is not life threatening but there are several treatment options. For treatment of pelvic organ prolapse, we discussed options for management including expectant management, conservative management, and surgical management, such as Kegels, a pessary, pelvic floor physical therapy, and specific surgical procedures.     Constipation: Our goal is to achieve formed bowel movements daily or every-other-day.  You may need to try different combinations of the following options to find what works best for you - everybody's body works differently so feel free to adjust the dosages as needed.  Some options to help maintain bowel health include:  Dietary changes (more leafy greens, vegetables and fruits; less  processed foods) Fiber supplementation (Benefiber, FiberCon, Metamucil or Psyllium). Start slow and increase gradually to full dose. Over-the-counter agents such as: stool softeners (Docusate or Colace) and/or laxatives (Miralax, milk of magnesia)  "Power Pudding" is a natural mixture that may help your constipation.  To make blend 1 cup applesauce, 1 cup wheat bran, and 3/4 cup prune juice, refrigerate and then take 1 tablespoon daily with a large glass of water as needed.

## 2023-10-22 ENCOUNTER — Other Ambulatory Visit (HOSPITAL_COMMUNITY): Payer: Self-pay

## 2023-10-26 ENCOUNTER — Other Ambulatory Visit (HOSPITAL_COMMUNITY): Payer: Self-pay

## 2023-10-26 ENCOUNTER — Other Ambulatory Visit: Payer: Self-pay

## 2023-10-26 ENCOUNTER — Other Ambulatory Visit: Payer: Self-pay | Admitting: Student

## 2023-10-26 DIAGNOSIS — I10 Essential (primary) hypertension: Secondary | ICD-10-CM

## 2023-10-26 DIAGNOSIS — E119 Type 2 diabetes mellitus without complications: Secondary | ICD-10-CM

## 2023-10-29 ENCOUNTER — Other Ambulatory Visit: Payer: Self-pay

## 2023-10-29 ENCOUNTER — Other Ambulatory Visit (HOSPITAL_COMMUNITY): Payer: Self-pay

## 2023-10-29 MED FILL — Metformin HCl Tab ER 24HR 500 MG: ORAL | 30 days supply | Qty: 120 | Fill #0 | Status: AC

## 2023-10-29 MED FILL — Lisinopril & Hydrochlorothiazide Tab 20-25 MG: ORAL | 30 days supply | Qty: 30 | Fill #0 | Status: AC

## 2023-10-29 MED FILL — Empagliflozin Tab 25 MG: ORAL | 30 days supply | Qty: 30 | Fill #0 | Status: AC

## 2023-10-29 NOTE — Telephone Encounter (Signed)
Patient was called and scheduled for an appointment 11/08/2023 @ 3:15 PM. Voiced understanding of need to follow up.

## 2023-10-30 ENCOUNTER — Other Ambulatory Visit: Payer: Self-pay

## 2023-11-07 ENCOUNTER — Other Ambulatory Visit (HOSPITAL_COMMUNITY): Payer: Self-pay

## 2023-11-07 MED ORDER — PREDNISONE 5 MG (21) PO TBPK
ORAL_TABLET | ORAL | 0 refills | Status: AC
Start: 2023-11-07 — End: 2023-11-13
  Filled 2023-11-07: qty 21, 6d supply, fill #0

## 2023-11-08 ENCOUNTER — Ambulatory Visit: Payer: BC Managed Care – PPO | Admitting: Student

## 2023-11-08 ENCOUNTER — Other Ambulatory Visit (HOSPITAL_COMMUNITY): Payer: Self-pay

## 2023-11-08 VITALS — BP 134/84 | HR 104 | Temp 98.0°F | Ht 61.77 in | Wt 206.8 lb

## 2023-11-08 DIAGNOSIS — I10 Essential (primary) hypertension: Secondary | ICD-10-CM

## 2023-11-08 DIAGNOSIS — E119 Type 2 diabetes mellitus without complications: Secondary | ICD-10-CM | POA: Diagnosis not present

## 2023-11-08 DIAGNOSIS — M797 Fibromyalgia: Secondary | ICD-10-CM | POA: Diagnosis not present

## 2023-11-08 DIAGNOSIS — N8110 Cystocele, unspecified: Secondary | ICD-10-CM

## 2023-11-08 DIAGNOSIS — Z0000 Encounter for general adult medical examination without abnormal findings: Secondary | ICD-10-CM

## 2023-11-08 DIAGNOSIS — Z7984 Long term (current) use of oral hypoglycemic drugs: Secondary | ICD-10-CM

## 2023-11-08 LAB — GLUCOSE, CAPILLARY: Glucose-Capillary: 260 mg/dL — ABNORMAL HIGH (ref 70–99)

## 2023-11-08 LAB — POCT GLYCOSYLATED HEMOGLOBIN (HGB A1C): Hemoglobin A1C: 10.7 % — AB (ref 4.0–5.6)

## 2023-11-08 MED ORDER — METFORMIN HCL ER 500 MG PO TB24
1000.0000 mg | ORAL_TABLET | Freq: Two times a day (BID) | ORAL | 1 refills | Status: DC
  Filled 2023-11-08 – 2023-12-02 (×2): qty 360, 90d supply, fill #0
  Filled 2023-12-04: qty 120, 30d supply, fill #0
  Filled 2024-01-14 – 2024-01-15 (×2): qty 120, 30d supply, fill #1
  Filled 2024-02-26: qty 120, 30d supply, fill #2
  Filled 2024-03-28: qty 120, 30d supply, fill #3
  Filled 2024-05-05: qty 120, 30d supply, fill #4
  Filled 2024-06-04: qty 120, 30d supply, fill #5

## 2023-11-08 MED ORDER — LISINOPRIL-HYDROCHLOROTHIAZIDE 20-25 MG PO TABS
1.0000 | ORAL_TABLET | Freq: Every day | ORAL | 1 refills | Status: DC
Start: 2023-11-08 — End: 2024-01-31
  Filled 2023-11-08 – 2023-12-02 (×2): qty 90, 90d supply, fill #0
  Filled 2023-12-04: qty 30, 30d supply, fill #0
  Filled 2024-01-14 – 2024-01-29 (×4): qty 30, 30d supply, fill #1

## 2023-11-08 MED ORDER — EMPAGLIFLOZIN 25 MG PO TABS
25.0000 mg | ORAL_TABLET | Freq: Every day | ORAL | 3 refills | Status: DC
Start: 2023-11-08 — End: 2024-07-08
  Filled 2023-11-08 – 2023-12-02 (×2): qty 30, 30d supply, fill #0
  Filled 2024-01-14: qty 30, 30d supply, fill #1
  Filled 2024-01-15: qty 90, 90d supply, fill #1

## 2023-11-08 NOTE — Assessment & Plan Note (Addendum)
A1c today is 10.7 (10.4 07/2023). Patient endorsing setbacks in regards to diet (bakes a lot) and exercise. Of note, patient had not been compliant with Jardiance and Metformin per 07/2023 office visit, but she has been compliant since. Patient also with recent bilateral steroid injections and is beginning a Prednisone course for shoulder pain that's being managed by [facility]. Patient counseled on how diet/exercise/prednisone can affect blood sugar, and she is excited about the opportunity of bringing A1c down. -Continue Jardiance and Metformin -Order UACR -Re-check A1c in 3 months

## 2023-11-08 NOTE — Assessment & Plan Note (Addendum)
In-office BP wnl. Managed with Zestoretic. Patient instructed to obtain BP cuff and bring in this + BP log to follow-up appointment.

## 2023-11-08 NOTE — Assessment & Plan Note (Signed)
As determined by recent OBGYN appointment (Dr. Vickey Sages) with associated urinary incontinence. Per chart review, patient opting to try Kegel exercises in addition to weight loss/better A1c control

## 2023-11-08 NOTE — Assessment & Plan Note (Signed)
Had recent OBGYN cervical cancer screening that was unremarkable. Referral sent for colonoscopy. Will perform foot exam at 13-month follow-up

## 2023-11-08 NOTE — Assessment & Plan Note (Signed)
Patient states pain is stable but manageable. Flexeril has improved symptoms. Counseled patient on setting realistic expectations in regards to chronic pain. The option for SSRI/SNRI to be discussed at follow-up if symptoms continue to be bothersome.  -Continue Flexeril

## 2023-11-08 NOTE — Progress Notes (Signed)
CC: Follow-up  HPI:  [name] is a 57 y.o. female living with a history stated below and presents today for a follow-up. Please see problem based assessment and plan for additional details.  Past Medical History:  Diagnosis Date   Agatston coronary artery calcium score less than 100    6.3 coronary Ca score 11/2021 CT   Allergic rhinitis    Anxiety    Asthma    Required inhaled corticosteroid in past.    Benign paroxysmal positional vertigo, unspecified laterality    Body mass index (BMI) 40.0-44.9, adult (HCC)    Cervical radiculopathy    Daily headache    Depression, major 02/25/2013   First episode that required was 2014. Prior episodes that did not require pt to seek medical tx.   DM (diabetes mellitus) (HCC)    Fibromyalgia    Hyperlipidemia    Hypertension    2 drug therapy   Left knee pain 01/15/2020   Low back pain 08/10/2016   Morbid obesity due to excess calories (HCC)    Nausea 09/19/2018   OSA (obstructive sleep apnea)    Sleep study 12/2006 : Moderately Severe OSA. AHI 38.8. O2 sat decreased to 71%.   Primary insomnia    Severe obesity (BMI >= 40) (HCC) 10/08/2012   Obesity Class 3. BMI > 40.    Strain of flexor muscle of right hip 09/19/2018   Tobacco abuse    Urge incontinence     Current Outpatient Medications on File Prior to Visit  Medication Sig Dispense Refill   atorvastatin (LIPITOR) 10 MG tablet Take 1 tablet (10 mg total) by mouth daily. 90 tablet 3   Blood Glucose Monitoring Suppl (ONETOUCH VERIO) w/Device KIT PLEASE CHECK BLOOD SUGAR 3 TIMES DAILY 1 kit 0   Blood Glucose Monitoring Suppl (TRUE METRIX METER) w/Device KIT Use as directed 1 kit 0   cyclobenzaprine (FLEXERIL) 5 MG tablet Take 1 tablet (5 mg total) by mouth 3 (three) times daily as needed for muscle spasms. 30 tablet 0   gabapentin (NEURONTIN) 300 MG capsule Take 1 capsule (300 mg total) by mouth at bedtime. 90 capsule 3   glucose blood test strip Please check blood  sugars 3 times daily 100 each 1   Lancets (ONETOUCH ULTRASOFT) lancets Please check blood sugars 3 times daily 100 each 12   predniSONE (STERAPRED UNI-PAK 21 TAB) 5 MG (21) TBPK tablet Take 6 tablets (30 mg total) by mouth daily for 1 day, THEN 5 tablets (25 mg total) daily for 1 day, THEN 4 tablets (20 mg total) daily for 1 day, THEN 3 tablets (15 mg total) daily for 1 day, THEN 2 tablets (10 mg total) daily for 1 day, THEN 1 tablet (5 mg total) daily for 1 day. 21 tablet 0   TRUEplus Lancets 30G MISC Use to check blood sugar 3 (three) times daily. 100 each 0   Vibegron (GEMTESA) 75 MG TABS Take 1 tablet (75 mg total) by mouth daily.     Vibegron (GEMTESA) 75 MG TABS Take 1 tablet (75 mg total) by mouth daily. 30 tablet 2   No current facility-administered medications on file prior to visit.    Family History  Problem Relation Age of Onset   Diabetes Mother    Hypertension Mother    Arthritis Mother    Hyperlipidemia Mother    Hypertension Father    Stroke Father        in his 44's   Heart  disease Father        died <47 y.o   Heart disease Sister    Heart disease Brother    Heart disease Maternal Grandmother    Stomach cancer Maternal Grandmother 45   Diabetes Maternal Aunt    Breast cancer Maternal Aunt 60   Asthma Maternal Aunt    Prostate cancer Maternal Uncle     Social History   Socioeconomic History   Marital status: Single    Spouse name: Not on file   Number of children: 3   Years of education: some coll.   Highest education level: Not on file  Occupational History   Occupation: Claims processing    Employer: [employer]  Tobacco Use   Smoking status: Former    Current packs/day: 0.00    Average packs/day: (0.9 ttl pk-yrs)    Types: Cigarettes    Start date: 05/20/1985    Quit date: 05/21/2015    Years since quitting: 8.4    Passive exposure: Never   Smokeless tobacco: Never  Vaping Use   Vaping status: Never Used  Substance and Sexual Activity    Alcohol use: Yes    Comment: rarely.   Drug use: No   Sexual activity: Not Currently    Partners: Male    Birth control/protection: Surgical    Comment: tubal  Other Topics Concern   Not on file  Social History Narrative   [place]. 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   Social Drivers of Corporate investment banker Strain: Not on file  Food Insecurity: No Food Insecurity (07/31/2023)   Hunger Vital Sign    Worried About Running Out of Food in the Last Year: Never true    Ran Out of Food in the Last Year: Never true  Transportation Needs: No Transportation Needs (07/31/2023)   PRAPARE - Administrator, Civil Service (Medical): No    Lack of Transportation (Non-Medical): No  Physical Activity: Not on file  Stress: Not on file  Social Connections: Not on file  Intimate Partner Violence: Not At Risk (07/31/2023)   Humiliation, Afraid, Rape, and Kick questionnaire    Fear of Current or Ex-Partner: No    Emotionally Abused: No    Physically Abused: No    Sexually Abused: No    Review of Systems: ROS negative except for what is noted on the assessment and plan.  Vitals:   11/08/23 1537  BP: 134/84  Pulse: (!) 104  Temp: 98 F (36.7 C)  TempSrc: Oral  SpO2: 100%  Weight: 206 lb 12.8 oz (93.8 kg)  Height: 5' 1.77" (1.569 m)    Physical Exam: Constitutional: well-appearing, sitting in chair, in no acute distress Cardiovascular: regular rate and rhythm, no m/r/g Pulmonary/Chest: normal work of breathing on room air, lungs clear to auscultation bilaterally Abdominal: soft, non-tender, non-distended MSK: normal bulk and tone Skin: warm and dry Psych: normal mood and behavior  Assessment & Plan:     Patient seen with Dr. Mayford Knife   Diabetes mellitus (HCC) A1c today is 10.7 (10.4 07/2023). Patient endorsing setbacks in regards to diet (bakes a lot) and exercise. Of note, patient had not been compliant with Jardiance and Metformin per 07/2023  office visit, but she has been compliant since. Patient also with recent bilateral steroid injections and is beginning a Prednisone course for shoulder pain that's being managed by [facility]. Patient counseled on how diet/exercise/prednisone can affect blood sugar, and  she is excited about the opportunity of bringing A1c down. -Continue Jardiance and Metformin -Order UACR -Re-check A1c in 3 months  Healthcare maintenance Had recent OBGYN cervical cancer screening that was unremarkable. Referral sent for colonoscopy. Will perform foot exam at 85-month follow-up  Essential hypertension In-office BP wnl. Managed with Zestoretic. Patient instructed to obtain BP cuff and bring in this + BP log to follow-up appointment.    Pelvic organ prolapse quantification stage 2 cystocele As determined by recent OBGYN appointment (Dr. Vickey Sages) with associated urinary incontinence. Per chart review, patient opting to try Kegel exercises in addition to weight loss/better A1c control  Fibromyalgia Patient states pain is stable but manageable. Flexeril has improved symptoms. Counseled patient on setting realistic expectations in regards to chronic pain. The option for SSRI/SNRI to be discussed at follow-up if symptoms continue to be bothersome.  -Continue Flexeril     Carmina Miller, D.O. [facility], PGY-1 Phone: [phone] Date 11/08/2023 Time 5:01 PM

## 2023-11-08 NOTE — Patient Instructions (Signed)
Please fill out the blood pressure log for a week or two and bring it in to your next visit.

## 2023-11-09 LAB — MICROALBUMIN / CREATININE URINE RATIO
Creatinine, Urine: 37 mg/dL
Microalb/Creat Ratio: <8 mg/g{creat} (ref 0–29)
Microalbumin, Urine: <3 ug/mL

## 2023-11-13 ENCOUNTER — Other Ambulatory Visit (HOSPITAL_COMMUNITY): Payer: Self-pay

## 2023-11-19 NOTE — Progress Notes (Signed)
Internal Medicine Clinic Attending  I was physically present during the key portions of the resident provided service and participated in the medical decision making of patient's management care. I reviewed pertinent patient test results.  The assessment, diagnosis, and plan were formulated together and I agree with the documentation in the resident's note.  Williams, Julie Anne, MD  

## 2023-12-03 ENCOUNTER — Other Ambulatory Visit (HOSPITAL_COMMUNITY): Payer: Self-pay

## 2023-12-04 ENCOUNTER — Other Ambulatory Visit (HOSPITAL_COMMUNITY): Payer: Self-pay

## 2023-12-05 ENCOUNTER — Other Ambulatory Visit (HOSPITAL_COMMUNITY): Payer: Self-pay

## 2023-12-24 ENCOUNTER — Ambulatory Visit (INDEPENDENT_AMBULATORY_CARE_PROVIDER_SITE_OTHER): Payer: BC Managed Care – PPO | Admitting: Student

## 2023-12-24 ENCOUNTER — Telehealth: Payer: Self-pay | Admitting: Student

## 2023-12-24 ENCOUNTER — Other Ambulatory Visit (HOSPITAL_COMMUNITY): Payer: Self-pay

## 2023-12-24 VITALS — BP 133/88 | HR 98 | Temp 98.0°F | Ht 61.0 in | Wt 205.0 lb

## 2023-12-24 DIAGNOSIS — R0989 Other specified symptoms and signs involving the circulatory and respiratory systems: Secondary | ICD-10-CM | POA: Diagnosis not present

## 2023-12-24 MED ORDER — FLUTICASONE PROPIONATE 50 MCG/ACT NA SUSP
2.0000 | Freq: Every day | NASAL | 2 refills | Status: DC
  Filled 2023-12-24: qty 16, 30d supply, fill #0

## 2023-12-24 MED ORDER — CETIRIZINE HCL 10 MG PO TABS
10.0000 mg | ORAL_TABLET | Freq: Every day | ORAL | 2 refills | Status: DC
  Filled 2023-12-24: qty 30, 30d supply, fill #0

## 2023-12-24 NOTE — Telephone Encounter (Signed)
Pt calling to report her face is swollen and her left eye is swollen and she is having a terrible ear ache.  Pt requesting a call back.

## 2023-12-24 NOTE — Progress Notes (Signed)
Subjective:  CC: Sinus pressure, congestion.  HPI:  Crystal English is a 58 y.o. person with a past medical history stated below and presents today for the stated chief complaint. Please see problem based assessment and plan for additional details.  Past Medical History:  Diagnosis Date   Agatston coronary artery calcium score less than 100    6.3 coronary Ca score 11/2021 CT   Allergic rhinitis    Anxiety    Asthma    Required inhaled corticosteroid in past.    Benign paroxysmal positional vertigo, unspecified laterality    Body mass index (BMI) 40.0-44.9, adult (HCC)    Cervical radiculopathy    Daily headache    Depression, major 02/25/2013   First episode that required was 2014. Prior episodes that did not require pt to seek medical tx.   DM (diabetes mellitus) (HCC)    Fibromyalgia    Hyperlipidemia    Hypertension    2 drug therapy   Left knee pain 01/15/2020   Low back pain 08/10/2016   Morbid obesity due to excess calories (HCC)    Nausea 09/19/2018   OSA (obstructive sleep apnea)    Sleep study 12/2006 : Moderately Severe OSA. AHI 38.8. O2 sat decreased to 71%.   Primary insomnia    Severe obesity (BMI >= 40) (HCC) 10/08/2012   Obesity Class 3. BMI > 40.    Strain of flexor muscle of right hip 09/19/2018   Tobacco abuse    Urge incontinence     Current Outpatient Medications on File Prior to Visit  Medication Sig Dispense Refill   atorvastatin (LIPITOR) 10 MG tablet Take 1 tablet (10 mg total) by mouth daily. 90 tablet 3   Blood Glucose Monitoring Suppl (ONETOUCH VERIO) w/Device KIT PLEASE CHECK BLOOD SUGAR 3 TIMES DAILY 1 kit 0   Blood Glucose Monitoring Suppl (TRUE METRIX METER) w/Device KIT Use as directed 1 kit 0   cyclobenzaprine (FLEXERIL) 5 MG tablet Take 1 tablet (5 mg total) by mouth 3 (three) times daily as needed for muscle spasms. 30 tablet 0   empagliflozin (JARDIANCE) 25 MG TABS tablet Take 1 tablet (25 mg total) by mouth daily. 30  tablet 3   gabapentin (NEURONTIN) 300 MG capsule Take 1 capsule (300 mg total) by mouth at bedtime. 90 capsule 3   glucose blood test strip Please check blood sugars 3 times daily 100 each 1   Lancets (ONETOUCH ULTRASOFT) lancets Please check blood sugars 3 times daily 100 each 12   lisinopril-hydrochlorothiazide (ZESTORETIC) 20-25 MG tablet Take 1 tablet by mouth daily. 90 tablet 1   metFORMIN (GLUCOPHAGE-XR) 500 MG 24 hr tablet Take 2 tablets (1,000 mg total) by mouth 2 (two) times daily. 360 tablet 1   TRUEplus Lancets 30G MISC Use to check blood sugar 3 (three) times daily. 100 each 0   Vibegron (GEMTESA) 75 MG TABS Take 1 tablet (75 mg total) by mouth daily.     Vibegron (GEMTESA) 75 MG TABS Take 1 tablet (75 mg total) by mouth daily. 30 tablet 2   No current facility-administered medications on file prior to visit.    Review of Systems: Please see assessment and plan for pertinent positives and negatives.  Objective:   Vitals:   12/24/23 1414  BP: 133/88  Pulse: 98  Temp: 98 F (36.7 C)  TempSrc: Oral  SpO2: 100%  Weight: 205 lb (93 kg)  Height: 5\' 1"  (1.549 m)    Physical Exam: Constitutional: Well-appearing  Cardiovascular: Regular rate and rhythm Pulmonary/Chest: lungs clear to auscultation bilaterally Abdominal: soft, non-tender, non-distended Extremities: No edema of the lower extremities bilaterally Psych: Pleasant affect Ears: Both tympanic membranes visualized and normal Head: Minimal tenderness with palpation over the maxillary sinuses.  No lymphadenopathy of the head or neck appreciated. Thought process is linear and is goal-directed.     Assessment & Plan:  Upper respiratory symptom Patient endorses some ongoing left ear pain, sinus pressure, and sneezing since last Thursday.  She endorses some chills, but no objective fevers.  Some postnasal drip with throat irritation and nausea, but relatively minor.  No vomiting, constipation, diarrhea, cough, foul  sputum production.   On exam both TMs look unremarkable, minimal pain with palpation of the sinuses.  Unclear what is causing her upper respiratory/sinusitis like symptoms.  May be allergic in nature or infectious.  Low suspicion for COVID, flu, or pneumonia given symptoms.  Plan: Will start Flonase, discussed proper technique for use of this medication Will start Zyrtec daily    Patient discussed with Dr. Elliot Cousin MD [facility] Internal Medicine  PGY-1 Pager: [phone]  Phone: [phone] Date 12/24/2023  Time 10:26 PM

## 2023-12-24 NOTE — Telephone Encounter (Signed)
Call to patient c/o red eyes and swollen face. Has had a runny nose since Friday.  Chills.  And an earache. Has not taken a temperature.   Given an appointment for today at 2:15 PM.

## 2023-12-24 NOTE — Patient Instructions (Addendum)
Thank you, Ms.Claudie Fisherman for allowing [place] to provide your care today.  Start the following medications: Meds ordered this encounter  Medications   fluticasone (FLONASE) 50 MCG/ACT nasal spray    Sig: Place 2 sprays into both nostrils daily.    Dispense:  9.9 mL    Refill:  2   cetirizine (ZYRTEC ALLERGY) 10 MG tablet    Sig: Take 1 tablet (10 mg total) by mouth daily.    Dispense:  30 tablet    Refill:  2     Follow up:  10 weeks  for Diabetes follow up   Please remember: If things get worse, give me a call.    We look forward to seeing you next time. Please call our clinic at [phone] if you have any questions or concerns. The best time to call is Monday-Friday from 9am-4pm, but there is someone available 24/7. If after hours or the weekend, call the main hospital number and ask for the Internal Medicine Resident On-Call. If you need medication refills, please notify your pharmacy one week in advance and they will send [place] a request.   Thank you for trusting me with your care. Wishing you the best!  Lovie Macadamia MD [facility] Center

## 2023-12-24 NOTE — Assessment & Plan Note (Signed)
Patient endorses some ongoing left ear pain, sinus pressure, and sneezing since last Thursday.  She endorses some chills, but no objective fevers.  Some postnasal drip with throat irritation and nausea, but relatively minor.  No vomiting, constipation, diarrhea, cough, foul sputum production.   On exam both TMs look unremarkable, minimal pain with palpation of the sinuses.  Unclear what is causing her upper respiratory/sinusitis like symptoms.  May be allergic in nature or infectious.  Low suspicion for COVID, flu, or pneumonia given symptoms.  Plan: Will start Flonase, discussed proper technique for use of this medication Will start Zyrtec daily

## 2023-12-25 NOTE — Telephone Encounter (Signed)
Patient seen yesterday with me in clinic. Thanks al

## 2024-01-03 ENCOUNTER — Other Ambulatory Visit (HOSPITAL_COMMUNITY): Payer: Self-pay

## 2024-01-08 ENCOUNTER — Other Ambulatory Visit (HOSPITAL_COMMUNITY): Payer: Self-pay

## 2024-01-08 ENCOUNTER — Other Ambulatory Visit (HOSPITAL_COMMUNITY)
Admission: RE | Admit: 2024-01-08 | Discharge: 2024-01-08 | Disposition: A | Discharge status: Home / Self Care | Payer: BC Managed Care – PPO | Source: Ambulatory Visit | Attending: Student in an Organized Health Care Education/Training Program | Admitting: Student in an Organized Health Care Education/Training Program

## 2024-01-08 ENCOUNTER — Ambulatory Visit (INDEPENDENT_AMBULATORY_CARE_PROVIDER_SITE_OTHER): Payer: BC Managed Care – PPO | Admitting: Student

## 2024-01-08 VITALS — BP 119/89 | HR 93 | Temp 98.4°F | Ht 61.0 in | Wt 203.4 lb

## 2024-01-08 DIAGNOSIS — Z7984 Long term (current) use of oral hypoglycemic drugs: Secondary | ICD-10-CM | POA: Diagnosis not present

## 2024-01-08 DIAGNOSIS — E119 Type 2 diabetes mellitus without complications: Secondary | ICD-10-CM

## 2024-01-08 DIAGNOSIS — B3731 Acute candidiasis of vulva and vagina: Secondary | ICD-10-CM | POA: Insufficient documentation

## 2024-01-08 DIAGNOSIS — Z113 Encounter for screening for infections with a predominantly sexual mode of transmission: Secondary | ICD-10-CM | POA: Diagnosis not present

## 2024-01-08 MED ORDER — FLUCONAZOLE 150 MG PO TABS
150.0000 mg | ORAL_TABLET | Freq: Every day | ORAL | 0 refills | Status: AC
  Filled 2024-01-08: qty 2, 2d supply, fill #0

## 2024-01-08 MED ORDER — MELATONIN 5 MG PO TABS
5.0000 mg | ORAL_TABLET | Freq: Every day | ORAL | 0 refills | Status: DC
Start: 2024-01-08 — End: 2024-03-27
  Filled 2024-01-08: qty 30, 30d supply, fill #0

## 2024-01-08 NOTE — Progress Notes (Unsigned)
CC: Follow-up and vaginal discharge/itch  HPI:  [name] is a 58 y.o. female living with a history stated below and presents today for follow-up and vaginal discharge/itch. Please see problem based assessment and plan for additional details.  Past Medical History:  Diagnosis Date   Agatston coronary artery calcium score less than 100    6.3 coronary Ca score 11/2021 CT   Allergic rhinitis    Anxiety    Asthma    Required inhaled corticosteroid in past.    Benign paroxysmal positional vertigo, unspecified laterality    Body mass index (BMI) 40.0-44.9, adult (HCC)    Cervical radiculopathy    Daily headache    Depression, major 02/25/2013   First episode that required was 2014. Prior episodes that did not require pt to seek medical tx.   DM (diabetes mellitus) (HCC)    Fibromyalgia    Hyperlipidemia    Hypertension    2 drug therapy   Left knee pain 01/15/2020   Low back pain 08/10/2016   Morbid obesity due to excess calories (HCC)    Nausea 09/19/2018   OSA (obstructive sleep apnea)    Sleep study 12/2006 : Moderately Severe OSA. AHI 38.8. O2 sat decreased to 71%.   Primary insomnia    Severe obesity (BMI >= 40) (HCC) 10/08/2012   Obesity Class 3. BMI > 40.    Strain of flexor muscle of right hip 09/19/2018   Tobacco abuse    Urge incontinence     Current Outpatient Medications on File Prior to Visit  Medication Sig Dispense Refill   atorvastatin (LIPITOR) 10 MG tablet Take 1 tablet (10 mg total) by mouth daily. 90 tablet 3   Blood Glucose Monitoring Suppl (ONETOUCH VERIO) w/Device KIT PLEASE CHECK BLOOD SUGAR 3 TIMES DAILY 1 kit 0   Blood Glucose Monitoring Suppl (TRUE METRIX METER) w/Device KIT Use as directed 1 kit 0   cetirizine (ZYRTEC ALLERGY) 10 MG tablet Take 1 tablet (10 mg total) by mouth daily. 30 tablet 2   cyclobenzaprine (FLEXERIL) 5 MG tablet Take 1 tablet (5 mg total) by mouth 3 (three) times daily as needed for muscle spasms. 30 tablet 0    empagliflozin (JARDIANCE) 25 MG TABS tablet Take 1 tablet (25 mg total) by mouth daily. 30 tablet 3   fluticasone (FLONASE) 50 MCG/ACT nasal spray Place 2 sprays into both nostrils daily. 16 g 2   gabapentin (NEURONTIN) 300 MG capsule Take 1 capsule (300 mg total) by mouth at bedtime. 90 capsule 3   glucose blood test strip Please check blood sugars 3 times daily 100 each 1   Lancets (ONETOUCH ULTRASOFT) lancets Please check blood sugars 3 times daily 100 each 12   lisinopril-hydrochlorothiazide (ZESTORETIC) 20-25 MG tablet Take 1 tablet by mouth daily. 90 tablet 1   metFORMIN (GLUCOPHAGE-XR) 500 MG 24 hr tablet Take 2 tablets (1,000 mg total) by mouth 2 (two) times daily. 360 tablet 1   TRUEplus Lancets 30G MISC Use to check blood sugar 3 (three) times daily. 100 each 0   Vibegron (GEMTESA) 75 MG TABS Take 1 tablet (75 mg total) by mouth daily.     Vibegron (GEMTESA) 75 MG TABS Take 1 tablet (75 mg total) by mouth daily. 30 tablet 2   No current facility-administered medications on file prior to visit.    Family History  Problem Relation Age of Onset   Diabetes Mother    Hypertension Mother    Arthritis Mother  Hyperlipidemia Mother    Hypertension Father    Stroke Father        in his 64's   Heart disease Father        died <47 y.o   Heart disease Sister    Heart disease Brother    Heart disease Maternal Grandmother    Stomach cancer Maternal Grandmother 41   Diabetes Maternal Aunt    Breast cancer Maternal Aunt 60   Asthma Maternal Aunt    Prostate cancer Maternal Uncle     Social History   Socioeconomic History   Marital status: Single    Spouse name: Not on file   Number of children: 3   Years of education: some coll.   Highest education level: Not on file  Occupational History   Occupation: Claims processing    Employer: [employer]  Tobacco Use   Smoking status: Former    Current packs/day: 0.00    Average packs/day: (0.9 ttl pk-yrs)    Types:  Cigarettes    Start date: 05/20/1985    Quit date: 05/21/2015    Years since quitting: 8.6    Passive exposure: Never   Smokeless tobacco: Never  Vaping Use   Vaping status: Never Used  Substance and Sexual Activity   Alcohol use: Yes    Comment: rarely.   Drug use: No   Sexual activity: Not Currently    Partners: Male    Birth control/protection: Surgical    Comment: tubal  Other Topics Concern   Not on file  Social History Narrative   Lives in [place] alone. 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   Social Drivers of Corporate investment banker Strain: Not on file  Food Insecurity: No Food Insecurity (07/31/2023)   Hunger Vital Sign    Worried About Running Out of Food in the Last Year: Never true    Ran Out of Food in the Last Year: Never true  Transportation Needs: No Transportation Needs (07/31/2023)   PRAPARE - Administrator, Civil Service (Medical): No    Lack of Transportation (Non-Medical): No  Physical Activity: Not on file  Stress: Not on file  Social Connections: Not on file  Intimate Partner Violence: Not At Risk (07/31/2023)   Humiliation, Afraid, Rape, and Kick questionnaire    Fear of Current or Ex-Partner: No    Emotionally Abused: No    Physically Abused: No    Sexually Abused: No    Review of Systems: ROS negative except for what is noted on the assessment and plan.  Vitals:   01/08/24 1500  BP: 119/89  Pulse: 93  Temp: 98.4 F (36.9 C)  TempSrc: Oral  SpO2: 99%  Weight: 203 lb 6.4 oz (92.3 kg)  Height: 5\' 1"  (1.549 m)    Physical Exam: Constitutional: well-appearing, sitting in chair, in no acute distress Cardiovascular: regular rate and rhythm, no m/r/g Pulmonary/Chest: normal work of breathing on room air, lungs clear to auscultation bilaterally Abdominal: soft, non-tender, non-distended MSK: normal bulk and tone Skin: warm and dry Psych: normal mood and behavior  Assessment & Plan:     Patient discussed with Dr.  Oswaldo Done  Vaginal candidiasis Endorses white vaginal discharge with associated pruritus that began last week.  Saw her OB/GYN a few months ago and was treated for a yeast infection with Diflucan.  Symptoms resolved.  No history of recurrent yeast infections.  Two potentially contributing factors are her worsening A1c and Jardiance.  Denies dysuria and is not sexually active. -Self swab to rule out BV -Order Diflucan -Counseled on the importance of improvement in A1c and how this contributes to yeast infections/UTIs -Consider alternate diabetes regimen if yeast infections persist  Diabetes mellitus (HCC) States her diet has improved since last A1c which was 10.7 in 10/2023 -Continue metformin 1000 mg twice daily and Jardiance 25 mg -Follow-up A1c in 1 month   Carmina Miller, D.O. [facility] Internal Medicine, PGY-1 Phone: [phone] Date 01/09/2024 Time 8:15 AM

## 2024-01-08 NOTE — Patient Instructions (Signed)
I sent in a prescription for Diflucan to treat your suspected yeast infection. Please take the first pill, and if symptoms do not resolve, take the second three days later.   Ultimately better control of your diabetes well help with this as well.

## 2024-01-09 ENCOUNTER — Other Ambulatory Visit (HOSPITAL_COMMUNITY): Payer: Self-pay

## 2024-01-09 ENCOUNTER — Other Ambulatory Visit: Payer: Self-pay | Admitting: Student

## 2024-01-09 DIAGNOSIS — B3731 Acute candidiasis of vulva and vagina: Secondary | ICD-10-CM | POA: Insufficient documentation

## 2024-01-09 LAB — CERVICOVAGINAL ANCILLARY ONLY
Bacterial Vaginitis (gardnerella): POSITIVE — AB
Candida Glabrata: NEGATIVE
Candida Vaginitis: POSITIVE — AB
Comment: NEGATIVE
Comment: NEGATIVE
Comment: NEGATIVE
Comment: NEGATIVE
Trichomonas: NEGATIVE

## 2024-01-09 MED ORDER — METRONIDAZOLE 500 MG PO TABS
500.0000 mg | ORAL_TABLET | Freq: Two times a day (BID) | ORAL | 0 refills | Status: DC
  Filled 2024-01-09: qty 14, 7d supply, fill #0

## 2024-01-09 NOTE — Assessment & Plan Note (Signed)
Endorses white vaginal discharge with associated pruritus that began last week.  Saw her OB/GYN a few months ago and was treated for a yeast infection with Diflucan.  Symptoms resolved.  No history of recurrent yeast infections.  Two potentially contributing factors are her worsening A1c and Jardiance.  Denies dysuria and is not sexually active. -Self swab to rule out BV -Order Diflucan -Counseled on the importance of improvement in A1c and how this contributes to yeast infections/UTIs -Consider alternate diabetes regimen if yeast infections persist

## 2024-01-09 NOTE — Progress Notes (Signed)
 Internal Medicine Clinic Attending  Case discussed with the resident physician at the time of the visit.  We reviewed the patient's history, exam, and pertinent patient test results.  I agree with the assessment, diagnosis, and plan of care documented in the resident's note.

## 2024-01-09 NOTE — Assessment & Plan Note (Signed)
States her diet has improved since last A1c which was 10.7 in 10/2023 -Continue metformin 1000 mg twice daily and Jardiance 25 mg -Follow-up A1c in 1 month

## 2024-01-15 ENCOUNTER — Other Ambulatory Visit (HOSPITAL_COMMUNITY): Payer: Self-pay

## 2024-01-15 ENCOUNTER — Other Ambulatory Visit: Payer: Self-pay

## 2024-01-16 ENCOUNTER — Other Ambulatory Visit (HOSPITAL_COMMUNITY): Payer: Self-pay

## 2024-01-17 ENCOUNTER — Telehealth: Payer: Self-pay | Admitting: Student

## 2024-01-17 NOTE — Telephone Encounter (Signed)
 Patient is due for her Mammogram 03/2024.  The [facility] will need a new order to be placed to reach out to the pt to get her sch.  Last Mammogram - MM 3D SCREENING MAMMOGRAM BILATERAL BREAST (Accession [id number]) (Order [id number]) Imaging Date: 04/02/2023 Department: [facility] Released By: Ross Ludwig Authorizing: Dickie La, MD    Can a new order be placed?

## 2024-01-21 ENCOUNTER — Other Ambulatory Visit: Payer: Self-pay | Admitting: Student

## 2024-01-21 DIAGNOSIS — Z0000 Encounter for general adult medical examination without abnormal findings: Secondary | ICD-10-CM

## 2024-01-25 ENCOUNTER — Other Ambulatory Visit (HOSPITAL_COMMUNITY): Payer: Self-pay

## 2024-01-25 ENCOUNTER — Ambulatory Visit: Payer: Self-pay | Admitting: Student

## 2024-01-25 NOTE — Telephone Encounter (Signed)
  Chief Complaint: headache Symptoms: Headache along with concerns for hair loss and nails breaking Frequency: around a month Pertinent Negatives: Patient denies CP, SOB Disposition: [] ED /[] Urgent Care (no appt availability in office) / [] Appointment(In office/virtual)/ []  [facility]/ [] Home Care/ [] Refused Recommended Disposition /[] [facility]/ [x]  Follow-up with PCP Additional Notes: patient called with concerns for headache which she states is in the middle of her head. Patient also endorses hair loss where her scalp is showing and nail breakage. Patient is wanting to be seen for possible labs and to be evaluate. Patient states pain is 3 out of 10 and she is currently at work. Per protocol. Patient to be seen in three days. [facility] is unable to schedule for [facility]. CAL called twice with no success. Patient instructed that message will be sent to provider team to call her back to schedule an appointment. Patient verbalized understanding and all questions answered.      Reason for Disposition  [1] MILD-MODERATE headache AND [2] present > 72 hours  Answer Assessment - Initial Assessment Questions 1. LOCATION: "Where does it hurt?"      Middle of head is where the pain is 2. ONSET: "When did the headache start?" (Minutes, hours or days)      Started around a month ago 3. PATTERN: "Does the pain come and go, or has it been constant since it started?"     Pain comes and goes but will stay longer  4. SEVERITY: "How bad is the pain?" and "What does it keep you from doing?"  (e.g., Scale 1-10; mild, moderate, or severe)   - MILD (1-3): doesn't interfere with normal activities    - MODERATE (4-7): interferes with normal activities or awakens from sleep    - SEVERE (8-10): excruciating pain, unable to do any normal activities        3 out of 10 5. RECURRENT SYMPTOM: "Have you ever had headaches before?" If Yes, ask: "When was the last time?" and "What  happened that time?"      Yes-occurred yesterday-lasted an hour 6. CAUSE: "What do you think is causing the headache?"     unsure 7. MIGRAINE: "Have you been diagnosed with migraine headaches?" If Yes, ask: "Is this headache similar?"      Yes-a very long time ago. 8. HEAD INJURY: "Has there been any recent injury to the head?"      No 9. OTHER SYMPTOMS: "Do you have any other symptoms?" (fever, stiff neck, eye pain, sore throat, cold symptoms)     Hair loss and breakage of nails  Protocols used: Headache-A-AH

## 2024-01-25 NOTE — Telephone Encounter (Signed)
 Copied From CRM [id number]. Reason for Triage: Patient states she has been having hair loss & breakage of her nails. She is concerned because each time she visits the salon its more and more hair that is coming out, she says she can see her scalp.Patient's call back # is [phone]   01/25/24-12:38- 1st attempt, no answer, left vmail.

## 2024-01-28 NOTE — Telephone Encounter (Signed)
 Call to patient continues to have headaches.  Has been taking Migraine medication which helps some.  Has pain in neck as well from a pinched nerve. Will take appointment for 9;15 AM on tomorrow morning.

## 2024-01-29 ENCOUNTER — Other Ambulatory Visit (HOSPITAL_COMMUNITY): Payer: Self-pay

## 2024-01-29 ENCOUNTER — Ambulatory Visit (INDEPENDENT_AMBULATORY_CARE_PROVIDER_SITE_OTHER): Admitting: Student

## 2024-01-29 ENCOUNTER — Telehealth: Payer: Self-pay | Admitting: *Deleted

## 2024-01-29 VITALS — BP 143/92 | HR 80 | Temp 98.2°F | Ht 60.0 in | Wt 207.0 lb

## 2024-01-29 DIAGNOSIS — F411 Generalized anxiety disorder: Secondary | ICD-10-CM | POA: Diagnosis not present

## 2024-01-29 DIAGNOSIS — E119 Type 2 diabetes mellitus without complications: Secondary | ICD-10-CM | POA: Diagnosis not present

## 2024-01-29 DIAGNOSIS — I10 Essential (primary) hypertension: Secondary | ICD-10-CM | POA: Diagnosis not present

## 2024-01-29 DIAGNOSIS — R079 Chest pain, unspecified: Secondary | ICD-10-CM

## 2024-01-29 MED ORDER — TRAZODONE HCL 50 MG PO TABS
50.0000 mg | ORAL_TABLET | Freq: Every evening | ORAL | 0 refills | Status: DC | PRN
Start: 2024-01-29 — End: 2024-03-26
  Filled 2024-01-29: qty 30, 30d supply, fill #0

## 2024-01-29 NOTE — Assessment & Plan Note (Signed)
 Patient states has been ongoing for many years now.  Her symptoms are particularly bad at night, and she says that she has difficulty sleeping due to racing thoughts.  She constantly worries about life stressors such as bills, appointments, etc.  She tried melatonin in the past with little relief, she is not currently on an SSRI.  GAD-7 score today 7 discussed beginning medication to treat her anxiety.  Patient states that she already takes many many medications, really like to avoid any additional medications. Plan: Offered SSRI such as Lexapro, patient declines at this time and would like to reassess need in the future. Trazodone 50 mg nightly as needed.

## 2024-01-29 NOTE — Patient Instructions (Addendum)
 Thank you, Ms.Claudie Fisherman for allowing [place] to provide your care today.  Referrals ordered today:   Referral Orders         Ambulatory referral to Cardiology         Referral to Nutrition and Diabetes Services      I have ordered the following medication/changed the following medications:   Stop the following medications: There are no discontinued medications.   Start the following medications: Meds ordered this encounter  Medications   traZODone (DESYREL) 50 MG tablet    Sig: Take 1 tablet (50 mg total) by mouth at bedtime as needed for sleep.    Dispense:  30 tablet    Refill:  0     Follow up:  2 to 4 weeks  for dedicated diabetes follow-up    We look forward to seeing you next time. Please call our clinic at [phone] if you have any questions or concerns. The best time to call is Monday-Friday from 9am-4pm, but there is someone available 24/7. If after hours or the weekend, call the main hospital number and ask for the Internal Medicine Resident On-Call. If you need medication refills, please notify your pharmacy one week in advance and they will send [place] a request.   Thank you for trusting me with your care. Wishing you the best!  Lovie Macadamia MD [facility]

## 2024-01-29 NOTE — Telephone Encounter (Signed)
 Spoke with patient she states she gets her mammogram done at [facility]. Patient states she will have their office to send to [place] the report when completed.

## 2024-01-29 NOTE — Assessment & Plan Note (Addendum)
 Hypertensive today to 143/92.  Patient states she has not taken her medication in the last 2 days.  Patient will begin taking her medications when she gets home.

## 2024-01-29 NOTE — Progress Notes (Signed)
 Subjective:  CC: Acute visit for chest pain  HPI:  [name] is a 58 y.o. person with a past medical history stated below and presents today for the stated chief complaint. Please see problem based assessment and plan for additional details.  Past Medical History:  Diagnosis Date   Agatston coronary artery calcium score less than 100    6.3 coronary Ca score 11/2021 CT   Allergic rhinitis    Anxiety    Asthma    Required inhaled corticosteroid in past.    Benign paroxysmal positional vertigo, unspecified laterality    Body mass index (BMI) 40.0-44.9, adult (HCC)    Cervical radiculopathy    Daily headache    Depression, major 02/25/2013   First episode that required was 2014. Prior episodes that did not require pt to seek medical tx.   DM (diabetes mellitus) (HCC)    Fibromyalgia    Hyperlipidemia    Hypertension    2 drug therapy   Left knee pain 01/15/2020   Low back pain 08/10/2016   Morbid obesity due to excess calories (HCC)    Nausea 09/19/2018   OSA (obstructive sleep apnea)    Sleep study 12/2006 : Moderately Severe OSA. AHI 38.8. O2 sat decreased to 71%.   Primary insomnia    Severe obesity (BMI >= 40) (HCC) 10/08/2012   Obesity Class 3. BMI > 40.    Strain of flexor muscle of right hip 09/19/2018   Tobacco abuse    Urge incontinence     Current Outpatient Medications on File Prior to Visit  Medication Sig Dispense Refill   atorvastatin (LIPITOR) 10 MG tablet Take 1 tablet (10 mg total) by mouth daily. 90 tablet 3   Blood Glucose Monitoring Suppl (ONETOUCH VERIO) w/Device KIT PLEASE CHECK BLOOD SUGAR 3 TIMES DAILY 1 kit 0   Blood Glucose Monitoring Suppl (TRUE METRIX METER) w/Device KIT Use as directed 1 kit 0   cetirizine (ZYRTEC ALLERGY) 10 MG tablet Take 1 tablet (10 mg total) by mouth daily. 30 tablet 2   cyclobenzaprine (FLEXERIL) 5 MG tablet Take 1 tablet (5 mg total) by mouth 3 (three) times daily as needed for muscle spasms. 30 tablet 0    empagliflozin (JARDIANCE) 25 MG TABS tablet Take 1 tablet (25 mg total) by mouth daily. 30 tablet 3   fluconazole (DIFLUCAN) 150 MG tablet Take 1 tablet (150 mg total) by mouth daily. 2 tablet 0   fluticasone (FLONASE) 50 MCG/ACT nasal spray Place 2 sprays into both nostrils daily. 16 g 2   gabapentin (NEURONTIN) 300 MG capsule Take 1 capsule (300 mg total) by mouth at bedtime. 90 capsule 3   glucose blood test strip Please check blood sugars 3 times daily 100 each 1   Lancets (ONETOUCH ULTRASOFT) lancets Please check blood sugars 3 times daily 100 each 12   lisinopril-hydrochlorothiazide (ZESTORETIC) 20-25 MG tablet Take 1 tablet by mouth daily. 90 tablet 1   melatonin 5 MG TABS Take 1 tablet (5 mg total) by mouth at bedtime. 30 tablet 0   metFORMIN (GLUCOPHAGE-XR) 500 MG 24 hr tablet Take 2 tablets (1,000 mg total) by mouth 2 (two) times daily. 360 tablet 1   metroNIDAZOLE (FLAGYL) 500 MG tablet Take 1 tablet (500 mg total) by mouth 2 (two) times daily. 14 tablet 0   TRUEplus Lancets 30G MISC Use to check blood sugar 3 (three) times daily. 100 each 0   Vibegron (GEMTESA) 75 MG TABS Take 1 tablet (  75 mg total) by mouth daily.     Vibegron (GEMTESA) 75 MG TABS Take 1 tablet (75 mg total) by mouth daily. 30 tablet 2   No current facility-administered medications on file prior to visit.    Review of Systems: Please see assessment and plan for pertinent positives and negatives.  Objective:   Vitals:   01/29/24 0928  BP: (!) 143/92  Pulse: 80  Temp: 98.2 F (36.8 C)  TempSrc: Oral  SpO2: 100%  Weight: 207 lb (93.9 kg)  Height: 5' (1.524 m)    Physical Exam: Constitutional: Well-appearing Cardiovascular: Regular rate and rhythm.  No rubs gallops or murmurs Pulmonary/Chest: lungs clear to auscultation bilaterally Abdominal: soft, non-tender, non-distended Extremities: No edema of the lower extremities bilaterally Psych: Pleasant affect Thought process is linear and is  goal-directed.     Assessment & Plan:  Chest pain Unclear etiology, patient states they have been having chest pain for a week or 2.  They currently do not have any chest pain.  They state the episodes last for about 20 minutes, and are on the left side of the body.  They do endorse throbbing neck pain, but is unclear if this is related to the episodes of chest pain.  They deny any shortness of breath, orthopnea, or exertional dyspnea.  They state that nothing makes it better, nothing makes it worse.  It seems to come and go on its own.  She does have a coronary calcium score of 6.3 which is 82nd percentile in January 2023.She also had an echo at that time which showed left ventricular ejection fraction 50 to 55% grade 1 diastolic dysfunction.  Based on her coronary calcium score cardiology referral is indicated.  Her chest pain seems relatively not anginal, but would be good for her to follow-up with cardiology given her coronary calcium score and risk factors.  She feels that this chest pain may be related to her anxiety, and states she has had issues with panic attacks in the past.  Plan: Cardiology referral given risk factors and calcium score. Continue with statin, blood pressure regiment   GAD (generalized anxiety disorder) Patient states has been ongoing for many years now.  Her symptoms are particularly bad at night, and she says that she has difficulty sleeping due to racing thoughts.  She constantly worries about life stressors such as bills, appointments, etc.  She tried melatonin in the past with little relief, she is not currently on an SSRI.  GAD-7 score today 7 discussed beginning medication to treat her anxiety.  Patient states that she already takes many many medications, really like to avoid any additional medications. Plan: Offered SSRI such as Lexapro, patient declines at this time and would like to reassess need in the future. Trazodone 50 mg nightly as needed.   Essential  hypertension Hypertensive today to 143/92.  Patient states she has not taken her medication in the last 2 days.  Patient will begin taking her medications when she gets home.   Diabetes mellitus (HCC) Poorly controlled, patient will require dedicated visit for her diabetes.  Discussed lifestyle modification today, patient interested in joining the gym.  She states that this was very helpful for her overall health and fibromyalgia.  Attempted to get patient in with diabetic educator Lupita Leash today, she has been previously referred but was not able to follow-up.  Sadly Lupita Leash is not in today.  I will place a referral for diabetic education. Will follow with [place] in 2-4 weeks for  DM follow up.   Patient discussed with Dr. Alver Sorrow MD [facility]  PGY-1 Pager: [phone]  Phone: [phone] Date 01/29/2024  Time 12:43 PM

## 2024-01-29 NOTE — Assessment & Plan Note (Addendum)
 Poorly controlled, patient will require dedicated visit for her diabetes.  Discussed lifestyle modification today, patient interested in joining the gym.  She states that this was very helpful for her overall health and fibromyalgia.  Attempted to get patient in with diabetic educator Lupita Leash today, she has been previously referred but was not able to follow-up.  Sadly Lupita Leash is not in today.  I will place a referral for diabetic education. Will follow with [place] in 2-4 weeks for DM follow up.

## 2024-01-29 NOTE — Assessment & Plan Note (Addendum)
 Unclear etiology, patient states they have been having chest pain for a week or 2.  They currently do not have any chest pain.  They state the episodes last for about 20 minutes, and are on the left side of the body.  They do endorse throbbing neck pain, but is unclear if this is related to the episodes of chest pain.  They deny any shortness of breath, orthopnea, or exertional dyspnea.  They state that nothing makes it better, nothing makes it worse.  It seems to come and go on its own.  She does have a coronary calcium score of 6.3 which is 82nd percentile in January 2023.She also had an echo at that time which showed left ventricular ejection fraction 50 to 55% grade 1 diastolic dysfunction.  Based on her coronary calcium score cardiology referral is indicated.  Her chest pain seems relatively not anginal, but would be good for her to follow-up with cardiology given her coronary calcium score and risk factors.  She feels that this chest pain may be related to her anxiety, and states she has had issues with panic attacks in the past.  Plan: Cardiology referral given risk factors and calcium score. Continue with statin, blood pressure regiment

## 2024-01-30 ENCOUNTER — Telehealth: Payer: Self-pay | Admitting: *Deleted

## 2024-01-30 NOTE — Telephone Encounter (Signed)
 Patient  goes to [facility] for her mammogram (per patient) she will have them to send the report to our office when this is done.

## 2024-01-31 ENCOUNTER — Ambulatory Visit: Admitting: Student

## 2024-01-31 ENCOUNTER — Ambulatory Visit: Payer: Self-pay | Admitting: Student

## 2024-01-31 ENCOUNTER — Other Ambulatory Visit (HOSPITAL_COMMUNITY): Payer: Self-pay

## 2024-01-31 VITALS — BP 136/84 | HR 87 | Temp 98.3°F | Ht 60.0 in | Wt 209.2 lb

## 2024-01-31 DIAGNOSIS — E119 Type 2 diabetes mellitus without complications: Secondary | ICD-10-CM

## 2024-01-31 DIAGNOSIS — R220 Localized swelling, mass and lump, head: Secondary | ICD-10-CM

## 2024-01-31 DIAGNOSIS — R5383 Other fatigue: Secondary | ICD-10-CM | POA: Diagnosis not present

## 2024-01-31 DIAGNOSIS — Z7984 Long term (current) use of oral hypoglycemic drugs: Secondary | ICD-10-CM | POA: Diagnosis not present

## 2024-01-31 DIAGNOSIS — R29898 Other symptoms and signs involving the musculoskeletal system: Secondary | ICD-10-CM | POA: Diagnosis not present

## 2024-01-31 DIAGNOSIS — M25361 Other instability, right knee: Secondary | ICD-10-CM | POA: Insufficient documentation

## 2024-01-31 DIAGNOSIS — I10 Essential (primary) hypertension: Secondary | ICD-10-CM

## 2024-01-31 MED ORDER — HYDROCHLOROTHIAZIDE 25 MG PO TABS
25.0000 mg | ORAL_TABLET | Freq: Every day | ORAL | 11 refills | Status: DC
  Filled 2024-01-31 – 2024-02-26 (×2): qty 30, 30d supply, fill #0
  Filled 2024-05-05: qty 30, 30d supply, fill #1
  Filled 2024-06-04: qty 30, 30d supply, fill #2
  Filled 2024-07-08: qty 30, 30d supply, fill #3
  Filled 2024-08-14 – 2024-09-01 (×2): qty 30, 30d supply, fill #4
  Filled 2024-10-07: qty 30, 30d supply, fill #5

## 2024-01-31 MED ORDER — LOSARTAN POTASSIUM 25 MG PO TABS
25.0000 mg | ORAL_TABLET | Freq: Every day | ORAL | 11 refills | Status: DC
  Filled 2024-01-31 – 2024-03-31 (×2): qty 30, 30d supply, fill #0
  Filled 2024-05-05: qty 30, 30d supply, fill #1
  Filled 2024-06-04: qty 30, 30d supply, fill #2
  Filled 2024-07-08: qty 30, 30d supply, fill #3
  Filled 2024-08-14 – 2024-09-01 (×2): qty 30, 30d supply, fill #4
  Filled 2024-10-07: qty 30, 30d supply, fill #5

## 2024-01-31 NOTE — Progress Notes (Signed)
 Internal Medicine Clinic Attending  Case discussed with the resident at the time of the visit.  We reviewed the resident's history and exam and pertinent patient test results.  I agree with the assessment, diagnosis, and plan of care documented in the resident's note.

## 2024-01-31 NOTE — Telephone Encounter (Signed)
 Return pt's call who stated she was seen on Tues ( saw Dr Rosaura Carpenter) and she continues to have facial swelling, h/a's, dizziness and neck pain. She took Benadryl which did not help. And wants to know if labs can be done?

## 2024-01-31 NOTE — Assessment & Plan Note (Signed)
 Patient endorses ongoing sensation of bilateral knee instability.  She states that her knees will sometimes give out on her.  On exam today, no joint line tenderness, no tenderness with pain with range of motion.  Negative anterior drawer, negative McMurray's test today.

## 2024-01-31 NOTE — Patient Instructions (Addendum)
 Thank you, Ms.Claudie Fisherman for allowing [place] to provide your care today.  I have ordered the following tests for you:   Lab Orders         CMP14 + Anion Gap         Urinalysis, Reflex Microscopic         Hemoglobin A1c         TSH       I have ordered the following medication/changed the following medications:   Stop the following medications: Medications Discontinued During This Encounter  Medication Reason   lisinopril-hydrochlorothiazide (ZESTORETIC) 20-25 MG tablet     Start the following medications: Meds ordered this encounter  Medications   hydrochlorothiazide (HYDRODIURIL) 25 MG tablet    Sig: Take 1 tablet (25 mg total) by mouth daily.    Dispense:  30 tablet    Refill:  11   losartan (COZAAR) 25 MG tablet    Sig: Take 1 tablet (25 mg total) by mouth daily.    Dispense:  30 tablet    Refill:  11     Follow up:  6-8 weeks  for follow up on facial swelling    We look forward to seeing you next time. Please call our clinic at [phone] if you have any questions or concerns. The best time to call is Monday-Friday from 9am-4pm, but there is someone available 24/7. If after hours or the weekend, call the main hospital number and ask for the Internal Medicine Resident On-Call. If you need medication refills, please notify your pharmacy one week in advance and they will send [place] a request.   Thank you for trusting me with your care. Wishing you the best!  Lovie Macadamia MD [facility]

## 2024-01-31 NOTE — Addendum Note (Signed)
 Addended by: Burnell Blanks on: 01/31/2024 02:23 PM   Modules accepted: Level of Service

## 2024-01-31 NOTE — Assessment & Plan Note (Signed)
 Patient endorsing recurrent facial swelling in the mornings.  She denies any airway involvement or wheezing.  No swelling elsewhere in the body.  Denies itching, or allergy-like symptoms such as stuffy nose or itchy eyes.  She says this has been going on for some weeks.  Unclear etiology at this time. She at times endorses foamy or frothy urine, but otherwise denies hematuria or other urinary symptoms.  Most recent microalbumin to creatinine ratio within normal limits.  Kidney function normal as well.  She is on lisinopril which may be causing some angioedema.  She states she has not taken this medication in the last couple days, and has been on lisinopril for some time but the angioedema from lisinopril may develop spontaneously and be persistent.  Plan: Will switch from lisinopril to Cozaar, assess blood pressure at subsequent visits and make dose adjustments as necessary. CMP to assess patient's kidney function and albumin UA to assess for possible proteinuria or nephrotic syndrome TSH

## 2024-01-31 NOTE — Progress Notes (Signed)
 Subjective:  CC: Facial swelling  HPI:  [name] is a 58 y.o. person with a past medical history stated below and presents today for the stated chief complaint. Please see problem based assessment and plan for additional details.  Past Medical History:  Diagnosis Date   Agatston coronary artery calcium score less than 100    6.3 coronary Ca score 11/2021 CT   Allergic rhinitis    Anxiety    Asthma    Required inhaled corticosteroid in past.    Benign paroxysmal positional vertigo, unspecified laterality    Body mass index (BMI) 40.0-44.9, adult (HCC)    Cervical radiculopathy    Daily headache    Depression, major 02/25/2013   First episode that required was 2014. Prior episodes that did not require pt to seek medical tx.   DM (diabetes mellitus) (HCC)    Fibromyalgia    Hyperlipidemia    Hypertension    2 drug therapy   Left knee pain 01/15/2020   Low back pain 08/10/2016   Morbid obesity due to excess calories (HCC)    Nausea 09/19/2018   OSA (obstructive sleep apnea)    Sleep study 12/2006 : Moderately Severe OSA. AHI 38.8. O2 sat decreased to 71%.   Primary insomnia    Severe obesity (BMI >= 40) (HCC) 10/08/2012   Obesity Class 3. BMI > 40.    Strain of flexor muscle of right hip 09/19/2018   Tobacco abuse    Urge incontinence     Current Outpatient Medications on File Prior to Visit  Medication Sig Dispense Refill   atorvastatin (LIPITOR) 10 MG tablet Take 1 tablet (10 mg total) by mouth daily. 90 tablet 3   Blood Glucose Monitoring Suppl (ONETOUCH VERIO) w/Device KIT PLEASE CHECK BLOOD SUGAR 3 TIMES DAILY 1 kit 0   Blood Glucose Monitoring Suppl (TRUE METRIX METER) w/Device KIT Use as directed 1 kit 0   cetirizine (ZYRTEC ALLERGY) 10 MG tablet Take 1 tablet (10 mg total) by mouth daily. 30 tablet 2   cyclobenzaprine (FLEXERIL) 5 MG tablet Take 1 tablet (5 mg total) by mouth 3 (three) times daily as needed for muscle spasms. 30 tablet 0    empagliflozin (JARDIANCE) 25 MG TABS tablet Take 1 tablet (25 mg total) by mouth daily. 30 tablet 3   fluconazole (DIFLUCAN) 150 MG tablet Take 1 tablet (150 mg total) by mouth daily. 2 tablet 0   fluticasone (FLONASE) 50 MCG/ACT nasal spray Place 2 sprays into both nostrils daily. 16 g 2   gabapentin (NEURONTIN) 300 MG capsule Take 1 capsule (300 mg total) by mouth at bedtime. 90 capsule 3   glucose blood test strip Please check blood sugars 3 times daily 100 each 1   Lancets (ONETOUCH ULTRASOFT) lancets Please check blood sugars 3 times daily 100 each 12   melatonin 5 MG TABS Take 1 tablet (5 mg total) by mouth at bedtime. 30 tablet 0   metFORMIN (GLUCOPHAGE-XR) 500 MG 24 hr tablet Take 2 tablets (1,000 mg total) by mouth 2 (two) times daily. 360 tablet 1   metroNIDAZOLE (FLAGYL) 500 MG tablet Take 1 tablet (500 mg total) by mouth 2 (two) times daily. 14 tablet 0   traZODone (DESYREL) 50 MG tablet Take 1 tablet (50 mg total) by mouth at bedtime as needed for sleep. 30 tablet 0   TRUEplus Lancets 30G MISC Use to check blood sugar 3 (three) times daily. 100 each 0   Vibegron (GEMTESA) 75  MG TABS Take 1 tablet (75 mg total) by mouth daily.     Vibegron (GEMTESA) 75 MG TABS Take 1 tablet (75 mg total) by mouth daily. 30 tablet 2   No current facility-administered medications on file prior to visit.    Review of Systems: Please see assessment and plan for pertinent positives and negatives.  Objective:   Vitals:   01/31/24 1551  BP: 136/84  Pulse: 87  Temp: 98.3 F (36.8 C)  TempSrc: Oral  SpO2: 99%  Weight: 209 lb 3.2 oz (94.9 kg)  Height: 5' (1.524 m)    Physical Exam: Constitutional: Well-appearing Cardiovascular: Regular rate and rhythm Pulmonary/Chest: lungs clear to auscultation bilaterally Abdominal: soft, non-tender, non-distended Extremities: No edema of the lower extremities bilaterally Psych: Pleasant affect Thought process is linear and is goal-directed.      Assessment & Plan:  Facial swelling Patient endorsing recurrent facial swelling in the mornings.  She denies any airway involvement or wheezing.  No swelling elsewhere in the body.  Denies itching, or allergy-like symptoms such as stuffy nose or itchy eyes.  She says this has been going on for some weeks.  Unclear etiology at this time. She at times endorses foamy or frothy urine, but otherwise denies hematuria or other urinary symptoms.  Most recent microalbumin to creatinine ratio within normal limits.  Kidney function normal as well.  She is on lisinopril which may be causing some angioedema.  She states she has not taken this medication in the last couple days, and has been on lisinopril for some time but the angioedema from lisinopril may develop spontaneously and be persistent.  Plan: Will switch from lisinopril to Cozaar, assess blood pressure at subsequent visits and make dose adjustments as necessary. CMP to assess patient's kidney function and albumin UA to assess for possible proteinuria or nephrotic syndrome TSH   Sensation of knee instability Patient endorses ongoing sensation of bilateral knee instability.  She states that her knees will sometimes give out on her.  On exam today, no joint line tenderness, no tenderness with pain with range of motion.  Negative anterior drawer, negative McMurray's test today.  Diabetes mellitus (HCC) Poorly controlled. Current medications include Jardiance 25 mg daily, metformin 1 g twice daily.  Last A1c 10.7.  Per chart review she had been started on Ozempic in the past, but was stopped due to metallic taste after injections.  She is hesitant to start new medications.  Per chart review, insulin has been discussed in the past, but the patient did not want to start insulin at that time and opted for lifestyle modifications.  She does admit that she has been suboptimal with implementing lifestyle changes. I will obtain an A1c today with her lab work,  and call to discuss results.   Patient discussed with Dr. Alver Sorrow MD [facility]  PGY-1 Pager: [phone]  Phone: [phone] Date 01/31/2024  Time 9:22 PM

## 2024-01-31 NOTE — Assessment & Plan Note (Signed)
 Poorly controlled. Current medications include Jardiance 25 mg daily, metformin 1 g twice daily.  Last A1c 10.7.  Per chart review she had been started on Ozempic in the past, but was stopped due to metallic taste after injections.  She is hesitant to start new medications.  Per chart review, insulin has been discussed in the past, but the patient did not want to start insulin at that time and opted for lifestyle modifications.  She does admit that she has been suboptimal with implementing lifestyle changes. I will obtain an A1c today with her lab work, and call to discuss results.

## 2024-01-31 NOTE — Telephone Encounter (Signed)
 Patient was seen in the office this week on 01/29/24. Patient stated that she was evaluated for multiple symptoms. Patient stated nothing has changed or worsened since the OV this week. Patient is seeking more answers about her symptoms and is requesting lab work to be done. This RN advised that I could not schedule English lab appointment without English provider's orders. This RN advised that I would route this conversation to the clinic for their discretion on scheduling English lab appointment. Patient complied.   Copied from CRM [id number]. Topic: Clinical - Red Word Triage >> Jan 31, 2024  8:47 AM Crystal English wrote: Red Word that prompted transfer to Nurse Triage: Patients face is swelling. Reason for Disposition  [1] Caller requesting NON-URGENT health information AND [2] PCP's office is the best resource  Protocols used: Information Only Call - No Triage-English-AH

## 2024-02-01 LAB — CMP14 + ANION GAP
ALT: 15 IU/L (ref 0–32)
AST: 14 IU/L (ref 0–40)
Albumin: 3.9 g/dL (ref 3.8–4.9)
Alkaline Phosphatase: 97 IU/L (ref 44–121)
Anion Gap: 14 mmol/L (ref 10.0–18.0)
BUN/Creatinine Ratio: 12 (ref 9–23)
BUN: 11 mg/dL (ref 6–24)
Bilirubin Total: 0.2 mg/dL (ref 0.0–1.2)
CO2: 23 mmol/L (ref 20–29)
Calcium: 9.1 mg/dL (ref 8.7–10.2)
Chloride: 105 mmol/L (ref 96–106)
Creatinine, Ser: 0.91 mg/dL (ref 0.57–1.00)
Globulin, Total: 2.3 g/dL (ref 1.5–4.5)
Glucose: 196 mg/dL — ABNORMAL HIGH (ref 70–99)
Potassium: 4.1 mmol/L (ref 3.5–5.2)
Sodium: 142 mmol/L (ref 134–144)
Total Protein: 6.2 g/dL (ref 6.0–8.5)
eGFR: 73 mL/min/{1.73_m2} (ref >59)

## 2024-02-01 LAB — URINALYSIS, ROUTINE W REFLEX MICROSCOPIC
Bilirubin, UA: NEGATIVE
Ketones, UA: NEGATIVE
Leukocytes,UA: NEGATIVE
Nitrite, UA: NEGATIVE
Protein,UA: NEGATIVE
RBC, UA: NEGATIVE
Specific Gravity, UA: >=1.03 — AB (ref 1.005–1.030)
Urobilinogen, Ur: 0.2 mg/dL (ref 0.2–1.0)
pH, UA: 5.5 (ref 5.0–7.5)

## 2024-02-01 LAB — HEMOGLOBIN A1C
Est. average glucose Bld gHb Est-mCnc: 249 mg/dL
Hgb A1c MFr Bld: 10.3 % — ABNORMAL HIGH (ref 4.8–5.6)

## 2024-02-01 LAB — TSH: TSH: 0.972 u[IU]/mL (ref 0.450–4.500)

## 2024-02-04 ENCOUNTER — Telehealth: Payer: Self-pay | Admitting: Student

## 2024-02-04 ENCOUNTER — Encounter: Payer: Self-pay | Admitting: Student

## 2024-02-04 NOTE — Telephone Encounter (Signed)
 Copied from CRM [id number]. Topic: General - Other >> Feb 04, 2024 11:47 AM Dennison Nancy wrote: Reason for CRM: Patient requesting to speak with the Dietician , reason when she had her appointment last week was told to call the office to speak with the dietician and to make an appointment with the Dietician Please contact patient at [phone]  Spoke with the pt and she has sch her Referral appt for the following day:  Name: Crystal English, Crystal English" MRN: [id number]  Date: 02/13/2024 Status: Sch  Time: 3:15 PM Length: 60  Visit Type: OPEN ESTABLISHED [726] Copay: $25.00  Provider: Plyler, Cecil Cranker, RD

## 2024-02-07 NOTE — Progress Notes (Signed)
 Internal Medicine Clinic Attending  Case discussed with the resident at the time of the visit.  We reviewed the resident's history and exam and pertinent patient test results.  I agree with the assessment, diagnosis, and plan of care documented in the resident's note.

## 2024-02-12 ENCOUNTER — Other Ambulatory Visit (HOSPITAL_COMMUNITY): Payer: Self-pay

## 2024-02-13 ENCOUNTER — Other Ambulatory Visit: Payer: Self-pay | Admitting: Student

## 2024-02-13 ENCOUNTER — Encounter: Admitting: Dietician

## 2024-02-13 ENCOUNTER — Telehealth: Payer: Self-pay | Admitting: Student

## 2024-02-13 ENCOUNTER — Other Ambulatory Visit (HOSPITAL_COMMUNITY): Payer: Self-pay

## 2024-02-13 DIAGNOSIS — E1169 Type 2 diabetes mellitus with other specified complication: Secondary | ICD-10-CM

## 2024-02-13 NOTE — Telephone Encounter (Signed)
 Copied from CRM [id number]. Topic: Appointments - Appointment Scheduling >> Feb 13, 2024  3:19 PM Irine Seal wrote: Patient will not be able to make appointment 02/13/24 3:15, when I attempted to reschedule her appointment for nutrition it told me "no solutions found without overruling" please call patient back to reschedule [phone]    Pt is aware of her new Appt below:  Name: Crystal English, Crystal English" MRN: [id number]  Date: 02/18/2024 Status: Sch  Time: 3:15 PM Length: 60  Visit Type: OPEN ESTABLISHED [726] Copay: $25.00  Provider: Plyler, Cecil Cranker, RD

## 2024-02-13 NOTE — Telephone Encounter (Signed)
 Spoke with the pt in regards to her Annual Diabetic Eye Exam.  Pt's last Eye Exam was 04/12/2023 and will be due for this year.   Can a New DM Eye Exam be placed for this patient as she will be due?

## 2024-02-18 ENCOUNTER — Ambulatory Visit (INDEPENDENT_AMBULATORY_CARE_PROVIDER_SITE_OTHER): Admitting: Dietician

## 2024-02-18 DIAGNOSIS — Z713 Dietary counseling and surveillance: Secondary | ICD-10-CM

## 2024-02-18 NOTE — Patient Instructions (Addendum)
 Please call you insurance to ask if nutrition (CPT code 65784) and diabetes (CPT code G0108)    Write down what you eat and drink during Continuous glucose monitoring use.   We'll look at your blood sugars at your follow up and see how it went.  Lupita Leash [phone] b

## 2024-02-19 NOTE — Progress Notes (Signed)
 Lab Results  Component Value Date   HGBA1C 10.3 (H) 01/31/2024   HGBA1C 10.7 (A) 11/08/2023   HGBA1C 10.4 (A) 07/31/2023   HGBA1C 7.6 (H) 02/14/2023   HGBA1C 7.9 (A) 10/17/2022    Diabetes Self-Management Education  Visit Type: First/Initial  Appt. Start Time: 325 Appt. End Time: 410  02/19/2024  Ms. Luciel Brickman, identified by name and date of birth, is a 58 y.o. female with a diagnosis of Diabetes: Type 2.   ASSESSMENT  Last menstrual period 03/04/2014. There is no height or weight on file to calculate BMI.   Diabetes Self-Management Education - 02/19/24 1600       Visit Information   Visit Type First/Initial      Initial Visit   Diabetes Type Type 2    Date Diagnosed 2019    Are you currently following a meal plan? --   she knows what to do and verbalized understanding; likes to cook sweets   Are you taking your medications as prescribed? Yes      Psychosocial Assessment   Patient Belief/Attitude about Diabetes Motivated to manage diabetes    What is the hardest part about your diabetes right now, causing you the most concern, or is the most worrisome to you about your diabetes?   Making healty food and beverage choices   she states she would like to control her diabetes without insulin, using lifestyle. Feels insulin is a failure   Self-care barriers Lack of material resources    Self-management support Family;Doctor's office;CDE visits    Patient Concerns Glycemic Control    Special Needs None    Preferred Learning Style No preference indicated    Learning Readiness Ready    How often do you need to have someone help you when you read instructions, pamphlets, or other written materials from your doctor or pharmacy? 2 - Rarely    What is the last grade level you completed in school? 12      Pre-Education Assessment   Patient understands the diabetes disease and treatment process. Comprehends key points    Patient understands incorporating nutritional  management into lifestyle. Needs Review    Patient undertands incorporating physical activity into lifestyle. Comprehends key points    Patient understands using medications safely. Needs Review    Patient understands monitoring blood glucose, interpreting and using results Needs Review    Patient understands prevention, detection, and treatment of acute complications. Needs Review    Patient understands prevention, detection, and treatment of chronic complications. Compreheands key points    Patient understands how to develop strategies to address psychosocial issues. Comprehends key points    Patient understands how to develop strategies to promote health/change behavior. Needs Review      Complications   Last HgB A1C per patient/outside source 10.3 %    How often do you check your blood sugar? 1-2 times/day   has not been chekcing but willing to begin again   Have you had a dilated eye exam in the past 12 months? Yes    Have you had a dental exam in the past 12 months? --   need to assess at fu   Are you checking your feet? --   need to assess at fu     Dietary Intake   Breakfast need to assess at fu      Activity / Exercise   Activity / Exercise Type ADL's;Light (walking / raking leaves)   need to assess at fu  Patient Education   Previous Diabetes Education Yes (please comment)    Healthy Eating Reviewed blood glucose goals for pre and post meals and how to evaluate the patients' food intake on their blood glucose level.    Medications Reviewed patients medication for diabetes, action, purpose, timing of dose and side effects.    Monitoring Taught/evaluated CGM (comment)    Acute complications Discussed and identified patients' prevention, symptoms, and treatment of hyperglycemia.      Individualized Goals (developed by patient)   Monitoring  Consistenly use CGM      Post-Education Assessment   Patient understands incorporating nutritional management into lifestyle.  Comprehends key points    Patient undertands incorporating physical activity into lifestyle. Comprehends key points    Patient understands monitoring blood glucose, interpreting and using results Comprehends key points    Patient understands prevention, detection, and treatment of acute complications. Comprehends key points      Outcomes   Expected Outcomes Demonstrated interest in learning. Expect positive outcomes    Future DMSE 2 wks    Program Status Not Completed             Individualized Plan for Diabetes Self-Management Training:   Learning Objective:  Patient will have a greater understanding of diabetes self-management. Patient education plan is to attend individual and/or group sessions per assessed needs and concerns.   Plan:   Patient Instructions  Please call you insurance to ask if nutrition (CPT code 16109) and diabetes (CPT code G0108)    Write down what you eat and drink during Continuous glucose monitoring use.   We'll look at your blood sugars at your follow up and see how it went.  Lupita Leash [phone] b      Expected Outcomes:  Demonstrated interest in learning. Expect positive outcomes  Education material provided: Diabetes Resources  If problems or questions, patient to contact team via:  Phone and Email  Future DSME appointment: 2 wks Norm Parcel, RD 02/19/2024 4:34 PM.

## 2024-02-20 ENCOUNTER — Telehealth: Payer: Self-pay | Admitting: *Deleted

## 2024-02-20 NOTE — Telephone Encounter (Signed)
 Patient states she [facility] and will have them to send report when she get s it done.

## 2024-02-26 ENCOUNTER — Other Ambulatory Visit (HOSPITAL_COMMUNITY): Payer: Self-pay

## 2024-02-28 ENCOUNTER — Encounter: Payer: Self-pay | Admitting: Dietician

## 2024-03-21 ENCOUNTER — Ambulatory Visit (HOSPITAL_COMMUNITY)
Admission: EM | Admit: 2024-03-21 | Discharge: 2024-03-21 | Disposition: A | Discharge status: Home / Self Care | Attending: Internal Medicine | Admitting: Internal Medicine

## 2024-03-21 ENCOUNTER — Encounter (HOSPITAL_COMMUNITY): Payer: Self-pay | Admitting: Emergency Medicine

## 2024-03-21 ENCOUNTER — Ambulatory Visit (INDEPENDENT_AMBULATORY_CARE_PROVIDER_SITE_OTHER)

## 2024-03-21 ENCOUNTER — Ambulatory Visit: Payer: Self-pay

## 2024-03-21 DIAGNOSIS — M79674 Pain in right toe(s): Secondary | ICD-10-CM | POA: Diagnosis not present

## 2024-03-21 DIAGNOSIS — S92411A Displaced fracture of proximal phalanx of right great toe, initial encounter for closed fracture: Secondary | ICD-10-CM

## 2024-03-21 DIAGNOSIS — S92501A Displaced unspecified fracture of right lesser toe(s), initial encounter for closed fracture: Secondary | ICD-10-CM | POA: Diagnosis not present

## 2024-03-21 DIAGNOSIS — S92911A Unspecified fracture of right toe(s), initial encounter for closed fracture: Secondary | ICD-10-CM

## 2024-03-21 DIAGNOSIS — S92511A Displaced fracture of proximal phalanx of right lesser toe(s), initial encounter for closed fracture: Secondary | ICD-10-CM | POA: Diagnosis not present

## 2024-03-21 NOTE — Discharge Instructions (Addendum)
 X-ray done today and final evaluation by the radiologist shows a Minimally displaced fracture through the base of the right 2nd toe. This appears to be in good position at this time and we will buddy tape the toes to help support the toe and have you wear a post-op shoe when up walking to avoid bending the toe. Ice the area 2-3 times daily for 10-15 minutes to help with pain and swelling. Do not apply ice directly to the skin.  May use tylenol  or ibuprofen  for pain. Call [facility] on Monday to schedule a follow up evaluation. Avoid prolonged walking until cleared by Orthopedics.

## 2024-03-21 NOTE — Telephone Encounter (Signed)
 Copied from CRM [id number]. Topic: Clinical - Red Word Triage >> Mar 21, 2024 10:34 AM Crystal English wrote: Red Word that prompted transfer to Nurse Triage: RT foot toe next to big toe 7/Hurts when putting pressure or weight on it. Just happened today 05/02    Chief Complaint: Injury to right foot, second toe, today. Swollen, painful. Symptoms: Above Frequency: Today Pertinent Negatives: Patient denies  Disposition: [] ED /[x] Urgent Care (no appt availability in office) / [] Appointment(In office/virtual)/ []  [facility]/ [] Home Care/ [] Refused Recommended Disposition /[] [facility]/ []  Follow-up with PCP Additional Notes: No availability per CAL, going to UC.  Reason for Disposition  [1] Toe injury AND [2] bad limp or can't wear shoes/sandals  Answer Assessment - Initial Assessment Questions 1. MECHANISM: "How did the injury happen?"      Hit toe 2. ONSET: "When did the injury happen?" (Minutes or hours ago)      2 hours ago 3. LOCATION: "What part of the toe is injured?" "Is the nail damaged?"      Right foot, second 4. APPEARANCE of TOE INJURY: "What does the injury look like?"      Swollen 5. SEVERITY: "Can you use the foot normally?" "Can you walk?"      Hurts to bear weight 6. SIZE: For cuts, bruises, or swelling, ask: "How large is it?" (e.g., inches or centimeters;  entire toe)      N/a 7. PAIN: "Is there pain?" If Yes, ask: "How bad is the pain?"   (e.g., Scale 1-10; or mild, moderate, severe)     7 8. TETANUS: For any breaks in the skin, ask: "When was the last tetanus booster?"     N/a 9. DIABETES: "Do you have a history of diabetes or poor circulation in the feet?"     yes 10. OTHER SYMPTOMS: "Do you have any other symptoms?"        no 11. PREGNANCY: "Is there any chance you are pregnant?" "When was your last menstrual period?"       no  Protocols used: Toe Injury-A-AH

## 2024-03-21 NOTE — ED Provider Notes (Signed)
 [facility]    CSN: [id number] Arrival date & time: 03/21/24  1627      History   Chief Complaint Chief Complaint  Patient presents with   Foot Pain    HPI Crystal English is a 58 y.o. female.   58 year old female who presents urgent care with complaints of right second toe pain.  She reports around 940 5:10 AM this morning she was walking in bedroom slippers and stepped on a piece of rebar.  She did not fall but had significant pain in the second toe followed by swelling.  She felt that the toe looked misshapen afterwards.  She has continued to have difficulty walking since then.  At rest the pain is not severe but whenever she tries to put weight on it the pain increases significantly especially when she pushes off.  She denies any injury to the area in the past.  She denies any knee pain or hip pain.  She did not fall or hit her head.   Foot Pain Pertinent negatives include no chest pain, no abdominal pain and no shortness of breath.    Past Medical History:  Diagnosis Date   Agatston coronary artery calcium  score less than 100    6.3 coronary Ca score 11/2021 CT   Allergic rhinitis    Anxiety    Asthma    Required inhaled corticosteroid in past.    Benign paroxysmal positional vertigo, unspecified laterality    Body mass index (BMI) 40.0-44.9, adult (HCC)    Cervical radiculopathy    Daily headache    Depression, major 02/25/2013   First episode that required was 2014. Prior episodes that did not require pt to seek medical tx.   DM (diabetes mellitus) (HCC)    Fibromyalgia    Hyperlipidemia    Hypertension    2 drug therapy   Left knee pain 01/15/2020   Low back pain 08/10/2016   Morbid obesity due to excess calories (HCC)    Nausea 09/19/2018   OSA (obstructive sleep apnea)    Sleep study 12/2006 : Moderately Severe OSA. AHI 38.8. O2 sat decreased to 71%.   Primary insomnia    Severe obesity (BMI >= 40) (HCC) 10/08/2012   Obesity Class 3. BMI >  40.    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   Sensation of knee instability 01/31/2024   Chest pain 01/29/2024   GAD (generalized anxiety disorder) 01/29/2024   Vaginal candidiasis 01/09/2024   Upper respiratory symptom 12/24/2023   Pelvic organ prolapse quantification stage 2 cystocele 10/10/2023   Nocturia 10/10/2023   Constipation 10/10/2023   Posterior right knee pain 04/25/2023   Hyperlipidemia 08/17/2022   Agatston coronary artery calcium  score less than 100 12/01/2021   BPPV (benign paroxysmal positional vertigo) 08/11/2020   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   Adjustment disorder with anxious mood 11/04/2019   Anxiety 04/03/2019   Urge urinary incontinence 10/12/2018   Dyspepsia 09/19/2018   Fibromyalgia 04/14/2018   Allergic rhinitis 05/04/2017   Migraine 08/10/2016   Diabetes mellitus (HCC) 02/14/2016   Perimenopause 12/23/2014   Chronic urticaria 04/08/2014   Cervical disc disorder with radiculopathy of cervical region 04/08/2014   Acute left-sided thoracic back pain 03/11/2013   Depression, major 02/25/2013   Healthcare maintenance 12/16/2012   Tobacco  abuse 10/08/2012   Severe obesity (BMI 35.0-35.9 with comorbidity) (HCC) 10/08/2012   Asthma, chronic 03/09/2010   OSA (obstructive sleep apnea) 01/01/2007   Essential hypertension 11/23/2006    Past Surgical History:  Procedure Laterality Date   DILATION AND CURETTAGE OF UTERUS     ECTOPIC PREGNANCY SURGERY     LAPAROTOMY     for tubal pregnancy   TUBAL LIGATION      OB History     Gravida  6   Para  3   Term  3   Preterm      AB  3   Living  3      SAB  1   IAB  1   Ectopic  1   Multiple      Live Births  3            Home Medications    Prior to Admission medications   Medication Sig Start Date End Date  Taking? Authorizing Provider  atorvastatin  (LIPITOR) 10 MG tablet Take 1 tablet (10 mg total) by mouth daily. 06/18/23   Ronni Colace, DO  Blood Glucose Monitoring Suppl (ONETOUCH VERIO) w/Device KIT PLEASE CHECK BLOOD SUGAR 3 TIMES DAILY 09/04/18   Mickael Alamo, MD  Blood Glucose Monitoring Suppl (TRUE METRIX METER) w/Device KIT Use as directed 09/14/22     cetirizine  (ZYRTEC  ALLERGY ) 10 MG tablet Take 1 tablet (10 mg total) by mouth daily. 12/24/23 12/23/24  Sheree Dieter, MD  cyclobenzaprine  (FLEXERIL ) 5 MG tablet Take 1 tablet (5 mg total) by mouth 3 (three) times daily as needed for muscle spasms. 09/26/23   Ronni Colace, DO  empagliflozin  (JARDIANCE ) 25 MG TABS tablet Take 1 tablet (25 mg total) by mouth daily. 11/08/23   Ronni Colace, DO  fluconazole  (DIFLUCAN ) 150 MG tablet Take 1 tablet (150 mg total) by mouth daily. 01/08/24   Ronni Colace, DO  fluticasone  (FLONASE ) 50 MCG/ACT nasal spray Place 2 sprays into both nostrils daily. 12/24/23 12/23/24  Sheree Dieter, MD  gabapentin  (NEURONTIN ) 300 MG capsule Take 1 capsule (300 mg total) by mouth at bedtime. 05/29/23   Masters, Katie, DO  glucose blood test strip Please check blood sugars 3 times daily 08/18/22   Mapp, Tavien, MD  hydrochlorothiazide  (HYDRODIURIL ) 25 MG tablet Take 1 tablet (25 mg total) by mouth daily. 01/31/24   Sheree Dieter, MD  Lancets [facility] ULTRASOFT) lancets Please check blood sugars 3 times daily 12/31/18   Mickael Alamo, MD  losartan  (COZAAR ) 25 MG tablet Take 1 tablet (25 mg total) by mouth daily. 01/31/24 01/30/25  Sheree Dieter, MD  melatonin 5 MG TABS Take 1 tablet (5 mg total) by mouth at bedtime. 01/08/24   Ronni Colace, DO  metFORMIN  (GLUCOPHAGE -XR) 500 MG 24 hr tablet Take 2 tablets (1,000 mg total) by mouth 2 (two) times daily. 11/08/23   Ronni Colace, DO  metroNIDAZOLE  (FLAGYL ) 500 MG tablet Take 1 tablet (500 mg total) by mouth 2 (two) times daily. 01/09/24   Ronni Colace, DO  traZODone   (DESYREL ) 50 MG tablet Take 1 tablet (50 mg total) by mouth at bedtime as needed for sleep. 01/29/24   Sheree Dieter, MD  TRUEplus Lancets 30G MISC Use to check blood sugar 3 (three) times daily. 09/14/22     Vibegron  (GEMTESA ) 75 MG TABS Take 1 tablet (75 mg total) by mouth daily. 10/10/23   Darlene Ehlers, MD  Vibegron  (GEMTESA ) 75 MG TABS Take 1 tablet (75 mg total) by mouth daily. 10/10/23  Darlene Ehlers, MD    Family History Family History  Problem Relation Age of Onset   Diabetes Mother    Hypertension Mother    Arthritis Mother    Hyperlipidemia Mother    Hypertension Father    Stroke Father        in his 60's   Heart disease Father        died <47 y.o   Heart disease Sister    Heart disease Brother    Heart disease Maternal Grandmother    Stomach cancer Maternal Grandmother 50   Diabetes Maternal Aunt    Breast cancer Maternal Aunt 60   Asthma Maternal Aunt    Prostate cancer Maternal Uncle     Social History Social History   Tobacco Use   Smoking status: Former    Current packs/day: 0.00    Average packs/day: (0.9 ttl pk-yrs)    Types: Cigarettes    Start date: 05/20/1985    Quit date: 05/21/2015    Years since quitting: 8.8    Passive exposure: Never   Smokeless tobacco: Never  Vaping Use   Vaping status: Never Used  Substance Use Topics   Alcohol use: Yes    Comment: rarely.   Drug use: No     Allergies   Patient has no known allergies.   Review of Systems Review of Systems  Constitutional:  Negative for chills and fever.  HENT:  Negative for ear pain and sore throat.   Eyes:  Negative for pain and visual disturbance.  Respiratory:  Negative for cough and shortness of breath.   Cardiovascular:  Negative for chest pain and palpitations.  Gastrointestinal:  Negative for abdominal pain and vomiting.  Genitourinary:  Negative for dysuria and hematuria.  Musculoskeletal:  Negative for arthralgias and back pain.       Right 2nd toe pain  Skin:   Negative for color change and rash.  Neurological:  Negative for seizures and syncope.  All other systems reviewed and are negative.    Physical Exam Triage Vital Signs ED Triage Vitals  Encounter Vitals Group     BP 03/21/24 1732 (!) 158/108     Systolic BP Percentile --      Diastolic BP Percentile --      Pulse Rate 03/21/24 1732 81     Resp 03/21/24 1732 17     Temp 03/21/24 1732 98.9 F (37.2 C)     Temp Source 03/21/24 1732 Oral     SpO2 03/21/24 1732 97 %     Weight --      Height --      Head Circumference --      Peak Flow --      Pain Score 03/21/24 1730 4     Pain Loc --      Pain Education --      Exclude from Growth Chart --    No data found.  Updated Vital Signs BP (!) 158/108 (BP Location: Left Arm)   Pulse 81   Temp 98.9 F (37.2 C) (Oral)   Resp 17   LMP 03/04/2014   SpO2 97%   Visual Acuity Right Eye Distance:   Left Eye Distance:   Bilateral Distance:    Right Eye Near:   Left Eye Near:    Bilateral Near:     Physical Exam Vitals and nursing note reviewed.  Constitutional:      General: She is not in acute distress.    Appearance:  She is well-developed.  HENT:     Head: Normocephalic and atraumatic.  Eyes:     Conjunctiva/sclera: Conjunctivae normal.  Cardiovascular:     Rate and Rhythm: Normal rate and regular rhythm.     Pulses:          Dorsalis pedis pulses are 2+ on the right side.       Posterior tibial pulses are 2+ on the right side.     Heart sounds: No murmur heard. Pulmonary:     Effort: Pulmonary effort is normal. No respiratory distress.     Breath sounds: Normal breath sounds.  Abdominal:     Palpations: Abdomen is soft.     Tenderness: There is no abdominal tenderness.  Musculoskeletal:        General: No swelling.     Cervical back: Neck supple.       Feet:  Feet:     Right foot:     Skin integrity: Skin integrity normal. No ulcer or skin breakdown.     Toenail Condition: Right toenails are normal.   Skin:    General: Skin is warm and dry.     Capillary Refill: Capillary refill takes less than 2 seconds.  Neurological:     Mental Status: She is alert.  Psychiatric:        Mood and Affect: Mood normal.     UC Treatments / Results  Labs (all labs ordered are listed, but only abnormal results are displayed) Labs Reviewed - No data to display  EKG   Radiology No results found.  Procedures Procedures (including critical care time)  Medications Ordered in UC Medications - No data to display  Initial Impression / Assessment and Plan / UC Course  I have reviewed the triage vital signs and the nursing notes.  Pertinent labs & imaging results that were available during my care of the patient were reviewed by me and considered in my medical decision making (see chart for details).     Closed displaced fracture of phalanx of toe of right foot, unspecified toe, initial encounter  Pain of toe of right foot - Plan: DG Toe 2nd Right, DG Toe 2nd Right   X-ray done today and final radiology evaluation shows a minimally displaced comminuted fracture through the base of the right 2nd toe proximal phalanx.  The patient is neurovascularly intact.  We will buddy tape the great toe and the second toe together and have the patient use a postop shoe for ambulation.  May remove this for bathing.  Reapply buddy tape after showering.  Advised that she avoid any prolonged ambulation until follow-up with orthopedics.  As it is Friday evening recommend contacting orthopedics first thing on Monday to schedule follow-up appointment.  The patient has seen [facility] in the past and will call them. Ice the area 2-3 times daily for 10-15 minutes to help with pain and swelling. Do not apply ice directly to the skin.  Can alternate Tylenol  and ibuprofen  to help with pain.  May return to urgent care as needed.  Final Clinical Impressions(s) / UC Diagnoses   Final diagnoses:  Pain of toe of right foot    Discharge Instructions   None    ED Prescriptions   None    PDMP not reviewed this encounter.   Kreg Pesa, [place] 03/21/24 [phone]

## 2024-03-21 NOTE — ED Triage Notes (Signed)
 Pt stepped on metal pole sticking out of the ground. Pt c/o right foot pain and swelling of 2nd toe. Pain worse with weight bearing.

## 2024-03-24 ENCOUNTER — Telehealth: Payer: Self-pay | Admitting: *Deleted

## 2024-03-24 NOTE — Telephone Encounter (Signed)
 Mammogram appointment 04-08-2024 @ 7:20 am [phone]. Spoke with patient she thought last mammogram was with Denna Fish, [facility].

## 2024-03-26 ENCOUNTER — Other Ambulatory Visit: Payer: Self-pay | Admitting: Student

## 2024-03-26 ENCOUNTER — Other Ambulatory Visit: Payer: Self-pay

## 2024-03-26 DIAGNOSIS — F411 Generalized anxiety disorder: Secondary | ICD-10-CM

## 2024-03-27 ENCOUNTER — Other Ambulatory Visit: Payer: Self-pay

## 2024-03-27 ENCOUNTER — Other Ambulatory Visit (HOSPITAL_COMMUNITY): Payer: Self-pay

## 2024-03-27 DIAGNOSIS — S92511A Displaced fracture of proximal phalanx of right lesser toe(s), initial encounter for closed fracture: Secondary | ICD-10-CM | POA: Diagnosis not present

## 2024-03-27 DIAGNOSIS — M79671 Pain in right foot: Secondary | ICD-10-CM | POA: Diagnosis not present

## 2024-03-27 MED ORDER — TRAZODONE HCL 50 MG PO TABS
50.0000 mg | ORAL_TABLET | Freq: Every evening | ORAL | 0 refills | Status: DC | PRN
  Filled 2024-03-27: qty 30, 30d supply, fill #0

## 2024-03-27 MED FILL — Melatonin Tab 5 MG: ORAL | 30 days supply | Qty: 30 | Fill #0 | Status: AC

## 2024-03-28 ENCOUNTER — Other Ambulatory Visit: Payer: Self-pay

## 2024-03-28 ENCOUNTER — Telehealth: Payer: Self-pay | Admitting: Student

## 2024-03-28 NOTE — Telephone Encounter (Signed)
 I called pt who stated she did not want to try a new med (Cozaar ) and want to stay on Lisinopril /hydrochlorothiazide . Pt has not taken Cozaar  yet.I reminded pt at OV 3/13 she c/o facial swelling and the doctor decided to change her medication. Stated she had forgotten; she thought facial swelling maybe from rogaine (for hair loss) in which she had recently tried. Stated she will try Cozaar  and will call back if she has any problems.

## 2024-03-28 NOTE — Telephone Encounter (Signed)
 Copied from CRM [id number]. Topic: Clinical - Medication Refill >> Mar 28, 2024 10:26 AM Carrielelia G wrote: Medication: lisinopril -hydrochlorothiazide  (ZESTORETIC ) 20-25 MG tablet   Has the patient contacted their pharmacy? No (Agent: If no, request that the patient contact the pharmacy for the refill. If patient does not wish to contact the pharmacy document the reason why and proceed with request.) (Agent: If yes, when and what did the pharmacy advise?)  This is the patient's preferred pharmacy:  [place] [facility] [address], [address] [place] [address] Phone: [phone] Fax: [phone]  Is this the correct pharmacy for this prescription? Yes If no, delete pharmacy and type the correct one.    Is the patient out of the medication? Yes  Has the patient been seen for an appointment in the last year OR does the patient have an upcoming appointment? Yes  Can we respond through MyChart? Yes  Agent: Please be advised that Rx refills may take up to 3 business days. We ask that you follow-up with your pharmacy.

## 2024-03-31 ENCOUNTER — Other Ambulatory Visit (HOSPITAL_COMMUNITY): Payer: Self-pay

## 2024-04-04 ENCOUNTER — Other Ambulatory Visit (HOSPITAL_COMMUNITY): Payer: Self-pay

## 2024-04-08 ENCOUNTER — Ambulatory Visit

## 2024-04-09 ENCOUNTER — Ambulatory Visit
Admission: RE | Admit: 2024-04-09 | Discharge: 2024-04-09 | Disposition: A | Discharge status: Home / Self Care | Source: Ambulatory Visit | Attending: Internal Medicine | Admitting: Internal Medicine

## 2024-04-09 DIAGNOSIS — Z0000 Encounter for general adult medical examination without abnormal findings: Secondary | ICD-10-CM

## 2024-04-09 DIAGNOSIS — Z1231 Encounter for screening mammogram for malignant neoplasm of breast: Secondary | ICD-10-CM | POA: Diagnosis not present

## 2024-04-10 ENCOUNTER — Encounter: Payer: Self-pay | Admitting: Cardiology

## 2024-04-28 DIAGNOSIS — S92511D Displaced fracture of proximal phalanx of right lesser toe(s), subsequent encounter for fracture with routine healing: Secondary | ICD-10-CM | POA: Diagnosis not present

## 2024-05-05 ENCOUNTER — Other Ambulatory Visit: Payer: Self-pay | Admitting: Student

## 2024-05-05 ENCOUNTER — Other Ambulatory Visit: Payer: Self-pay

## 2024-05-05 DIAGNOSIS — F411 Generalized anxiety disorder: Secondary | ICD-10-CM

## 2024-05-06 ENCOUNTER — Other Ambulatory Visit: Payer: Self-pay

## 2024-05-06 MED FILL — Trazodone HCl Tab 50 MG: ORAL | 30 days supply | Qty: 30 | Fill #0 | Status: AC

## 2024-05-07 ENCOUNTER — Other Ambulatory Visit: Payer: Self-pay

## 2024-05-07 ENCOUNTER — Other Ambulatory Visit (HOSPITAL_COMMUNITY): Payer: Self-pay

## 2024-05-09 ENCOUNTER — Emergency Department (HOSPITAL_COMMUNITY)
Admission: EM | Admit: 2024-05-09 | Discharge: 2024-05-09 | Discharge status: Against Medical Advice | Attending: Emergency Medicine | Admitting: Emergency Medicine

## 2024-05-09 ENCOUNTER — Other Ambulatory Visit: Payer: Self-pay

## 2024-05-09 ENCOUNTER — Emergency Department (HOSPITAL_COMMUNITY)

## 2024-05-09 ENCOUNTER — Ambulatory Visit: Payer: Self-pay

## 2024-05-09 ENCOUNTER — Encounter (HOSPITAL_COMMUNITY): Payer: Self-pay

## 2024-05-09 DIAGNOSIS — R0789 Other chest pain: Secondary | ICD-10-CM | POA: Diagnosis not present

## 2024-05-09 DIAGNOSIS — R079 Chest pain, unspecified: Secondary | ICD-10-CM | POA: Diagnosis not present

## 2024-05-09 DIAGNOSIS — R110 Nausea: Secondary | ICD-10-CM | POA: Insufficient documentation

## 2024-05-09 DIAGNOSIS — R519 Headache, unspecified: Secondary | ICD-10-CM | POA: Diagnosis not present

## 2024-05-09 DIAGNOSIS — Z5321 Procedure and treatment not carried out due to patient leaving prior to being seen by health care provider: Secondary | ICD-10-CM | POA: Insufficient documentation

## 2024-05-09 LAB — BASIC METABOLIC PANEL WITH GFR
Anion gap: 18 — ABNORMAL HIGH (ref 5–15)
BUN: 12 mg/dL (ref 6–20)
CO2: 22 mmol/L (ref 22–32)
Calcium: 9.3 mg/dL (ref 8.9–10.3)
Chloride: 101 mmol/L (ref 98–111)
Creatinine, Ser: 0.97 mg/dL (ref 0.44–1.00)
GFR, Estimated: >60 mL/min (ref >60)
Glucose, Bld: 131 mg/dL — ABNORMAL HIGH (ref 70–99)
Potassium: 3.6 mmol/L (ref 3.5–5.1)
Sodium: 141 mmol/L (ref 135–145)

## 2024-05-09 LAB — CBC
HCT: 43.5 % (ref 36.0–46.0)
Hemoglobin: 13.7 g/dL (ref 12.0–15.0)
MCH: 29.1 pg (ref 26.0–34.0)
MCHC: 31.5 g/dL (ref 30.0–36.0)
MCV: 92.6 fL (ref 80.0–100.0)
Platelets: 284 10*3/uL (ref 150–400)
RBC: 4.7 MIL/uL (ref 3.87–5.11)
RDW: 14.4 % (ref 11.5–15.5)
WBC: 6.4 10*3/uL (ref 4.0–10.5)
nRBC: 0 % (ref 0.0–0.2)

## 2024-05-09 LAB — TROPONIN I (HIGH SENSITIVITY): Troponin I (High Sensitivity): 5 ng/L (ref <18)

## 2024-05-09 NOTE — ED Triage Notes (Signed)
 Pt came in via POV d/t Lt sided CP that has been intermittent the past few days & then worsened yesterday & became more frequent. Rates pain 3/10 during triage & that it feels like a tingling/stinging sensation. D/t her family Hx of cardiac issues her PCP recommended she come in for evaluation. Denies any pain radiation, just has a HA.

## 2024-05-09 NOTE — Telephone Encounter (Signed)
 Copied from CRM [id number]. Topic: Clinical - Red Word Triage >> May 09, 2024 12:17 PM Fredrica W wrote: Red Word that prompted transfer to Nurse Triage: Chest pains and migraine    Reason for Disposition  [1] Chest pain (or angina) comes and goes AND [2] is happening more often (increasing in frequency) or getting worse (increasing in severity)  (Exception: Chest pains that last only a few seconds.)  Answer Assessment - Initial Assessment Questions 1. LOCATION: Where does it hurt?       Left side  2. RADIATION: Does the pain go anywhere else? (e.g., into neck, jaw, arms, back)     No 3. ONSET: When did the chest pain begin? (Minutes, hours or days)      3-4 days  4. PATTERN: Does the pain come and go, or has it been constant since it started?  Does it get worse with exertion?      Intermittent  5. DURATION: How long does it last (e.g., seconds, minutes, hours)     A minute or two  6. SEVERITY: How bad is the pain?  (e.g., Scale 1-10; mild, moderate, or severe)    - MILD (1-3): doesn't interfere with normal activities     - MODERATE (4-7): interferes with normal activities or awakens from sleep    - SEVERE (8-10): excruciating pain, unable to do any normal activities       Mild  7. CARDIAC RISK FACTORS: Do you have any history of heart problems or risk factors for heart disease? (e.g., angina, prior heart attack; diabetes, high blood pressure, high cholesterol, smoker, or strong family history of heart disease)     Hypertension, hyperlipidemia, diabetes   8. PULMONARY RISK FACTORS: Do you have any history of lung disease?  (e.g., blood clots in lung, asthma, emphysema, birth control pills)     Asthma  9. CAUSE: What do you think is causing the chest pain?     No 10. OTHER SYMPTOMS: Do you have any other symptoms? (e.g., dizziness, nausea, vomiting, sweating, fever, difficulty breathing, cough)       Headache  Protocols used: Chest Pain-A-AH    FYI Only or  Action Required?: FYI only for provider.  Patient was last seen in primary care on 01/31/2024 by Sheree Dieter, MD. Called Nurse Triage reporting Chest Pain. Symptoms began 3-4 days ago. gradually worsening.  Triage Disposition: Go to ED Now (Notify PCP)  Patient/caregiver understands and will follow disposition?: Yes

## 2024-05-09 NOTE — ED Provider Triage Note (Cosign Needed)
 Emergency Medicine Provider Triage Evaluation Note  Crystal English , a 58 y.o. female  was evaluated in triage.  Pt complains of chest pain.  Symptoms have been ongoing for several days and are intermittent in nature, chest pain became worse yesterday is primarily left-sided without radiation.  Patient is not currently experiencing chest pain.  She did become nauseated earlier today but this resolved after she was able to eat a meal.  She also had a headache earlier in the day which she attributes to poor sleep.  No shortness of breath, lower extremity edema, vomiting/abdominal pain.  No cardiac history however she reports both sides of my family have had heart attacks.  Review of Systems  Positive: As above Negative: As above  Physical Exam  BP (!) 160/107 (BP Location: Right Arm)   Pulse 89   Resp 16   Ht 5' 1 (1.549 m)   Wt 93.9 kg   LMP 03/04/2014   SpO2 97%   BMI 39.11 kg/m  Gen:   Awake, no distress   Resp:  Normal effort  MSK:   Moves extremities without difficulty  Other:    Medical Decision Making  Medically screening exam initiated at 3:50 PM.  Appropriate orders placed.  Crystal English was informed that the remainder of the evaluation will be completed by another provider, this initial triage assessment does not replace that evaluation, and the importance of remaining in the ED until their evaluation is complete.     Kendrick Pax, [place] 05/09/24 1551

## 2024-05-09 NOTE — ED Notes (Signed)
 Patient stated that she is living AMA, has been waiting for a long while

## 2024-05-12 ENCOUNTER — Other Ambulatory Visit (HOSPITAL_COMMUNITY): Payer: Self-pay

## 2024-05-28 DIAGNOSIS — S92511D Displaced fracture of proximal phalanx of right lesser toe(s), subsequent encounter for fracture with routine healing: Secondary | ICD-10-CM | POA: Diagnosis not present

## 2024-06-04 ENCOUNTER — Other Ambulatory Visit: Payer: Self-pay | Admitting: Student

## 2024-06-04 ENCOUNTER — Other Ambulatory Visit (HOSPITAL_COMMUNITY): Payer: Self-pay

## 2024-06-04 DIAGNOSIS — F411 Generalized anxiety disorder: Secondary | ICD-10-CM

## 2024-06-04 DIAGNOSIS — E7849 Other hyperlipidemia: Secondary | ICD-10-CM

## 2024-06-04 MED ORDER — TRAZODONE HCL 50 MG PO TABS
50.0000 mg | ORAL_TABLET | Freq: Every evening | ORAL | 0 refills | Status: DC | PRN
Start: 2024-06-04 — End: 2024-07-09
  Filled 2024-06-04: qty 30, 30d supply, fill #0

## 2024-06-04 MED ORDER — ATORVASTATIN CALCIUM 10 MG PO TABS
10.0000 mg | ORAL_TABLET | Freq: Every day | ORAL | 0 refills | Status: DC
  Filled 2024-06-04 – 2024-10-07 (×2): qty 90, 90d supply, fill #0

## 2024-06-23 ENCOUNTER — Ambulatory Visit: Payer: Self-pay

## 2024-06-23 NOTE — Telephone Encounter (Signed)
 FYI Only or Action Required?: FYI only for provider.  Patient was last seen in primary care on 01/31/2024 by Crystal Boga, MD.  Called Nurse Triage reporting Leg Pain.  Symptoms began several weeks ago.  Interventions attempted: Rest, hydration, or home remedies.  Symptoms are: unchanged.  Triage Disposition: See HCP Within 4 Hours (Or PCP Triage)  Patient/caregiver understands and will follow disposition?: Yes  Copied from CRM #[id number]. Topic: Clinical - Red Word Triage >> Jun 23, 2024  1:16 PM Carmell R wrote: Red Word that prompted transfer to Nurse Triage: Painful to walk for the last 2 weeks. Goes away and comes back, but not getting better, 8/10 for pain for most of the day, no pain when laying down   ----------------------------------------------------------------------- From previous Reason for Contact - Scheduling: Patient/patient representative is calling to schedule an appointment. Refer to attachments for appointment information. Reason for Disposition  [1] SEVERE pain (e.g., excruciating, unable to do any normal activities) AND [2] not improved after 2 hours of pain medicine  Answer Assessment - Initial Assessment Questions 1. ONSET: When did the pain start?      Two weeks ago 2. LOCATION: Where is the pain located?      Bilateral legs  3. PAIN: How bad is the pain?    (Scale 1-10; or mild, moderate, severe)     8/10 when not resting 4. WORK OR EXERCISE: Has there been any recent work or exercise that involved this part of the body?      denies 5. CAUSE: What do you think is causing the leg pain?     Unsure 6. OTHER SYMPTOMS: Do you have any other symptoms? (e.g., chest pain, back pain, breathing difficulty, swelling, rash, fever, numbness, weakness)     None  Additional info: Offered acute visit at 315 today but patient is at work and unable to leave, she will proceed to urgent care after work today around 430pm.  Protocols used: Leg  Pain-A-AH

## 2024-06-30 ENCOUNTER — Encounter: Admitting: Student

## 2024-07-08 ENCOUNTER — Ambulatory Visit (HOSPITAL_COMMUNITY)
Admission: RE | Admit: 2024-07-08 | Discharge: 2024-07-08 | Disposition: A | Discharge status: Home / Self Care | Source: Ambulatory Visit | Attending: Emergency Medicine | Admitting: Emergency Medicine

## 2024-07-08 ENCOUNTER — Ambulatory Visit: Payer: Self-pay

## 2024-07-08 ENCOUNTER — Other Ambulatory Visit: Payer: Self-pay | Admitting: Student

## 2024-07-08 ENCOUNTER — Other Ambulatory Visit (HOSPITAL_COMMUNITY): Payer: Self-pay

## 2024-07-08 ENCOUNTER — Ambulatory Visit (INDEPENDENT_AMBULATORY_CARE_PROVIDER_SITE_OTHER)

## 2024-07-08 ENCOUNTER — Other Ambulatory Visit: Payer: Self-pay

## 2024-07-08 ENCOUNTER — Encounter (HOSPITAL_COMMUNITY): Payer: Self-pay

## 2024-07-08 VITALS — BP 158/103 | HR 85 | Temp 98.1°F | Resp 18

## 2024-07-08 DIAGNOSIS — M5431 Sciatica, right side: Secondary | ICD-10-CM

## 2024-07-08 DIAGNOSIS — M5416 Radiculopathy, lumbar region: Secondary | ICD-10-CM

## 2024-07-08 DIAGNOSIS — M79652 Pain in left thigh: Secondary | ICD-10-CM | POA: Diagnosis not present

## 2024-07-08 DIAGNOSIS — M47816 Spondylosis without myelopathy or radiculopathy, lumbar region: Secondary | ICD-10-CM | POA: Diagnosis not present

## 2024-07-08 DIAGNOSIS — F411 Generalized anxiety disorder: Secondary | ICD-10-CM

## 2024-07-08 DIAGNOSIS — M545 Low back pain, unspecified: Secondary | ICD-10-CM

## 2024-07-08 DIAGNOSIS — E119 Type 2 diabetes mellitus without complications: Secondary | ICD-10-CM

## 2024-07-08 DIAGNOSIS — M47817 Spondylosis without myelopathy or radiculopathy, lumbosacral region: Secondary | ICD-10-CM | POA: Diagnosis not present

## 2024-07-08 DIAGNOSIS — M79605 Pain in left leg: Secondary | ICD-10-CM | POA: Diagnosis not present

## 2024-07-08 MED ORDER — CYCLOBENZAPRINE HCL 10 MG PO TABS
10.0000 mg | ORAL_TABLET | Freq: Two times a day (BID) | ORAL | 0 refills | Status: DC | PRN
  Filled 2024-07-08: qty 20, 10d supply, fill #0

## 2024-07-08 MED ORDER — CYCLOBENZAPRINE HCL 10 MG PO TABS: 10.0000 mg | ORAL_TABLET | Freq: Two times a day (BID) | ORAL | 0 refills | Status: AC | PRN

## 2024-07-08 MED ORDER — METFORMIN HCL ER 500 MG PO TB24
1000.0000 mg | ORAL_TABLET | Freq: Two times a day (BID) | ORAL | 1 refills | Status: AC
  Filled 2024-07-08: qty 360, 90d supply, fill #0
  Filled 2024-11-24: qty 360, 90d supply, fill #1

## 2024-07-08 MED ORDER — KETOROLAC TROMETHAMINE 30 MG/ML IJ SOLN
INTRAMUSCULAR | Status: AC
  Filled 2024-07-08: qty 1

## 2024-07-08 MED ORDER — KETOROLAC TROMETHAMINE 30 MG/ML IJ SOLN
30.0000 mg | Freq: Once | INTRAMUSCULAR | Status: AC
  Administered 2024-07-08: 30 mg via INTRAMUSCULAR

## 2024-07-08 NOTE — Discharge Instructions (Addendum)
 Your xray shows degenerative changes, as previously seen on past imaging.   The Toradol  injection given today should start to work in about 30 minutes. Please do not use any NSAIDs (ibuprofen /Advil , naproxen/Aleve, meloxicam/mobic etc) for the next 24 hours. You can safely use tylenol .   You can take the muscle relaxer Flexeril  twice daily. If the medication makes you drowsy, take only at bed time.  Call orthopedics tomorrow morning to make an appointment.  Please go to the emergency department if symptoms worsen.

## 2024-07-08 NOTE — Telephone Encounter (Signed)
 Called pt who stated she does not get off of work until 1630PM; too late for an appt here; going to UC after work.

## 2024-07-08 NOTE — ED Provider Notes (Signed)
 [facility]    CSN: [id number] Arrival date & time: 07/08/24  1650      History   Chief Complaint Chief Complaint  Patient presents with   Leg Pain   Appointment    17:00    HPI Crystal English is a 58 y.o. female.  1 month history of low back pain, and leg pain. Mostly on the right side, from right glue down the leg. But she does have left leg pain too. No known injury, trauma, fall Sometimes pain feels worse with sitting, otherwise comes randomly.  Not numbness/tingling or weakness No bladder/bowel dysfunction  Tried old meloxicam prescription. No meds yet today   Lumbar MRI in 2023 with severe facet arthrosis L5-S1  DM history, last A1c was 10.3 Good kidney function  Past Medical History:  Diagnosis Date   Agatston coronary artery calcium  score less than 100    6.3 coronary Ca score 11/2021 CT   Allergic rhinitis    Anxiety    Asthma    Required inhaled corticosteroid in past.    Benign paroxysmal positional vertigo, unspecified laterality    Body mass index (BMI) 40.0-44.9, adult (HCC)    Cervical radiculopathy    Daily headache    Depression, major 02/25/2013   First episode that required was 2014. Prior episodes that did not require pt to seek medical tx.   DM (diabetes mellitus) (HCC)    Fibromyalgia    Hyperlipidemia    Hypertension    2 drug therapy   Left knee pain 01/15/2020   Low back pain 08/10/2016   Morbid obesity due to excess calories (HCC)    Nausea 09/19/2018   OSA (obstructive sleep apnea)    Sleep study 12/2006 : Moderately Severe OSA. AHI 38.8. O2 sat decreased to 71%.   Primary insomnia    Severe obesity (BMI >= 40) (HCC) 10/08/2012   Obesity Class 3. BMI > 40.    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   Sensation of knee instability  01/31/2024   Chest pain 01/29/2024   GAD (generalized anxiety disorder) 01/29/2024   Vaginal candidiasis 01/09/2024   Upper respiratory symptom 12/24/2023   Pelvic organ prolapse quantification stage 2 cystocele 10/10/2023   Nocturia 10/10/2023   Constipation 10/10/2023   Posterior right knee pain 04/25/2023   Hyperlipidemia 08/17/2022   Agatston coronary artery calcium  score less than 100 12/01/2021   BPPV (benign paroxysmal positional vertigo) 08/11/2020   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   Adjustment disorder with anxious mood 11/04/2019   Anxiety 04/03/2019   Urge urinary incontinence 10/12/2018   Dyspepsia 09/19/2018   Fibromyalgia 04/14/2018   Allergic rhinitis 05/04/2017   Migraine 08/10/2016   Diabetes mellitus (HCC) 02/14/2016   Perimenopause 12/23/2014   Chronic urticaria 04/08/2014   Cervical disc disorder with radiculopathy of cervical region 04/08/2014   Acute left-sided thoracic back pain 03/11/2013   Depression, major 02/25/2013   Healthcare maintenance 12/16/2012   Tobacco abuse 10/08/2012   Severe obesity (BMI 35.0-35.9 with comorbidity) (HCC) 10/08/2012   Asthma, chronic 03/09/2010   OSA (obstructive sleep apnea) 01/01/2007   Essential hypertension 11/23/2006    Past Surgical History:  Procedure Laterality Date   DILATION AND CURETTAGE OF UTERUS     ECTOPIC PREGNANCY SURGERY     LAPAROTOMY  for tubal pregnancy   TUBAL LIGATION      OB History     Gravida  6   Para  3   Term  3   Preterm      AB  3   Living  3      SAB  1   IAB  1   Ectopic  1   Multiple      Live Births  3            Home Medications    Prior to Admission medications   Medication Sig Start Date End Date Taking? Authorizing Provider  cyclobenzaprine  (FLEXERIL ) 10 MG tablet Take 1 tablet (10 mg total) by mouth 2 (two) times daily as needed for muscle spasms. 07/08/24  Yes Sigismund Cross, [place], PA-C  atorvastatin  (LIPITOR) 10 MG tablet Take 1 tablet (10 mg total) by  mouth daily. 06/04/24   Tawkaliyar, Roya, DO  Blood Glucose Monitoring Suppl (ONETOUCH VERIO) w/Device KIT PLEASE CHECK BLOOD SUGAR 3 TIMES DAILY 09/04/18   Kerman Soulier, MD  Blood Glucose Monitoring Suppl (TRUE METRIX METER) w/Device KIT Use as directed 09/14/22     cetirizine  (ZYRTEC  ALLERGY ) 10 MG tablet Take 1 tablet (10 mg total) by mouth daily. 12/24/23 12/23/24  [name], MD  fluconazole  (DIFLUCAN ) 150 MG tablet Take 1 tablet (150 mg total) by mouth daily. 01/08/24   Marylu Gee, DO  fluticasone  (FLONASE ) 50 MCG/ACT nasal spray Place 2 sprays into both nostrils daily. 12/24/23 12/23/24  [name], MD  glucose blood test strip Please check blood sugars 3 times daily 08/18/22   Mapp, Tavien, MD  hydrochlorothiazide  (HYDRODIURIL ) 25 MG tablet Take 1 tablet (25 mg total) by mouth daily. 01/31/24   [name], MD  Lancets [facility] ULTRASOFT) lancets Please check blood sugars 3 times daily 12/31/18   Kerman Soulier, MD  losartan  (COZAAR ) 25 MG tablet Take 1 tablet (25 mg total) by mouth daily. 01/31/24 01/30/25  [name], MD  melatonin 5 MG TABS Take 1 tablet (5 mg total) by mouth at bedtime. 03/27/24   Marylu Gee, DO  metFORMIN  (GLUCOPHAGE -XR) 500 MG 24 hr tablet Take 2 tablets (1,000 mg total) by mouth 2 (two) times daily. 07/08/24   Marylu Gee, DO  traZODone  (DESYREL ) 50 MG tablet Take 1 tablet (50 mg total) by mouth at bedtime as needed for sleep. 06/04/24   Tawkaliyar, Roya, DO  TRUEplus Lancets 30G MISC Use to check blood sugar 3 (three) times daily. 09/14/22       Family History Family History  Problem Relation Age of Onset   Diabetes Mother    Hypertension Mother    Arthritis Mother    Hyperlipidemia Mother    Hypertension Father    Stroke Father        in his 47's   Heart disease Father        died <47 y.o   Heart disease Sister    Heart disease Brother    Heart disease Maternal Grandmother    Stomach cancer Maternal Grandmother 39   Diabetes  Maternal Aunt    Breast cancer Maternal Aunt 60   Asthma Maternal Aunt    Prostate cancer Maternal Uncle     Social History Social History   Tobacco Use   Smoking status: Former    Current packs/day: 0.00    Average packs/day: (0.9 ttl pk-yrs)    Types: Cigarettes    Start date: 05/20/1985    Quit date: 05/21/2015  Years since quitting: 9.1    Passive exposure: Never   Smokeless tobacco: Never  Vaping Use   Vaping status: Never Used  Substance Use Topics   Alcohol use: Yes    Comment: rarely.   Drug use: No     Allergies   Patient has no known allergies.   Review of Systems Review of Systems  As per HPI  Physical Exam Triage Vital Signs ED Triage Vitals  Encounter Vitals Group     BP 07/08/24 1729 (!) 150/105     Girls Systolic BP Percentile --      Girls Diastolic BP Percentile --      Boys Systolic BP Percentile --      Boys Diastolic BP Percentile --      Pulse Rate 07/08/24 1729 86     Resp 07/08/24 1729 18     Temp 07/08/24 1729 98.1 F (36.7 C)     Temp Source 07/08/24 1729 Oral     SpO2 07/08/24 1729 96 %     Weight --      Height --      Head Circumference --      Peak Flow --      Pain Score 07/08/24 1725 3     Pain Loc --      Pain Education --      Exclude from Growth Chart --    No data found.  Updated Vital Signs BP (!) 158/103 (BP Location: Right Arm)   Pulse 85   Temp 98.1 F (36.7 C) (Oral)   Resp 18   LMP 03/04/2014   SpO2 97%     Physical Exam Vitals and nursing note reviewed.  Constitutional:      General: She is not in acute distress. HENT:     Mouth/Throat:     Mouth: Mucous membranes are moist.     Pharynx: Oropharynx is clear.  Eyes:     Extraocular Movements: Extraocular movements intact.     Conjunctiva/sclera: Conjunctivae normal.     Pupils: Pupils are equal, round, and reactive to light.  Cardiovascular:     Rate and Rhythm: Normal rate and regular rhythm.     Pulses: Normal pulses.     Heart sounds:  Normal heart sounds.  Pulmonary:     Effort: Pulmonary effort is normal.     Breath sounds: Normal breath sounds.  Musculoskeletal:        General: No tenderness or signs of injury. Normal range of motion.     Cervical back: Normal range of motion. No rigidity or tenderness.     Right lower leg: No edema.     Left lower leg: No edema.     Comments: No bony tenderness C-L spine. No muscular paraspinal tenderness. Not experiencing pain during exam.   Skin:    General: Skin is warm and dry.  Neurological:     General: No focal deficit present.     Mental Status: She is alert and oriented to person, place, and time.     Cranial Nerves: Cranial nerves 2-12 are intact. No cranial nerve deficit.     Sensory: Sensation is intact.     Motor: Motor function is intact. No weakness.     Coordination: Coordination is intact.     Gait: Gait is intact.     Deep Tendon Reflexes: Reflexes are normal and symmetric.     Comments: Strength 5/5. Sensation intact throughout      UC Treatments /  Results  Labs (all labs ordered are listed, but only abnormal results are displayed) Labs Reviewed - No data to display  EKG   Radiology DG Lumbar Spine Complete Result Date: 07/08/2024 EXAM: 4 or more VIEW(S) XRAY OF THE LUMBAR SPINE 07/08/2024 06:31:19 PM COMPARISON: MRI of 11/27/2021. CLINICAL HISTORY: Low back pain, pain into legs. History of degenerative disc. Right leg pain for 4 weeks. Pain has moved into right buttocks. Pain in lower back and slightly in left thigh. Pain is random. Unable to get appt with PCP, they suggested patient come to urgent care. FINDINGS: LUMBAR SPINE: BONES: No acute fracture. No aggressive appearing osseous lesion. 5 lumbar type vertebral bodies. Sacroiliac joints are symmetric. Alignment is normal. DISCS AND DEGENERATIVE CHANGES: Mild loss of intervertebral disc height at the lumbosacral junction. Facet arthropathy at L4-5 and L5-S1. SOFT TISSUES: No acute abnormality.  LIMITATIONS/ARTIFACTS: Images are degraded by patient body habitus. IMPRESSION: 1. Mild spondylosis, without acute osseous finding. Technique limitations as detailed above. Electronically signed by: Rockey Kilts MD 07/08/2024 06:37 PM EDT RP Workstation: [id number]    Procedures Procedures (including critical care time)  Medications Ordered in UC Medications  ketorolac  (TORADOL ) 30 MG/ML injection 30 mg (30 mg Intramuscular Given 07/08/24 1846)    Initial Impression / Assessment and Plan / UC Course  I have reviewed the triage vital signs and the nursing notes.  Pertinent labs & imaging results that were available during my care of the patient were reviewed by me and considered in my medical decision making (see chart for details).  Stable vitals.  Neurologically intact. No red flags. Lumbar xray limited by body habitus. Facet arthropathy as seen previously on MRI. Mild spondylosis. Images independently reviewed by me, agree with radiology interpretation. Good kidney function per chart review. IM toradol  given in clinic. Advised no NSAIDs for 24 hours Try flexeril , drowsy precautions Call orthopedics to make follow up appointment ED precautions   Final Clinical Impressions(s) / UC Diagnoses   Final diagnoses:  Acute bilateral low back pain, unspecified whether sciatica present  Lumbar radiculopathy  Sciatica of right side     Discharge Instructions      Your xray shows degenerative changes, as previously seen on past imaging.   The Toradol  injection given today should start to work in about 30 minutes. Please do not use any NSAIDs (ibuprofen /Advil , naproxen/Aleve, meloxicam/mobic etc) for the next 24 hours. You can safely use tylenol .   You can take the muscle relaxer Flexeril  twice daily. If the medication makes you drowsy, take only at bed time.  Call orthopedics tomorrow morning to make an appointment.  Please go to the emergency department if symptoms worsen.     ED  Prescriptions     Medication Sig Dispense Auth. Provider   cyclobenzaprine  (FLEXERIL ) 10 MG tablet Take 1 tablet (10 mg total) by mouth 2 (two) times daily as needed for muscle spasms. 20 tablet Syria Kestner, [place], PA-C      PDMP not reviewed this encounter.   Sierra Spargo, Asberry RIGGERS 07/08/24 8144

## 2024-07-08 NOTE — Telephone Encounter (Signed)
 FYI Only or Action Required?: FYI only for provider.  Patient was last seen in primary care on 01/31/2024 by Crystal Boga, MD.  Called Nurse Triage reporting Leg Pain.  Symptoms began about a month ago.  Interventions attempted: Nothing.  Symptoms are: gradually worsening.  Triage Disposition: See PCP When Office is Open (Within 3 Days)  Patient/caregiver understands and will follow disposition?: Yes  To UC  Copied from CRM #[id number]. Topic: Clinical - Red Word Triage >> Jul 08, 2024 10:36 AM Zane F wrote: [facility] that prompted transfer to Nurse Triage:   Leg pain; located from legs to buttocks  Pain Level: 3/10 Reason for Disposition  [1] MODERATE pain (e.g., interferes with normal activities, limping) AND [2] present > 3 days  Answer Assessment - Initial Assessment Questions 1. ONSET: When did the pain start?      4 weeks ago 2. LOCATION: Where is the pain located?      Both legs 3. PAIN: How bad is the pain?    (Scale 1-10; or mild, moderate, severe)     Goes to buttocks; mild to severe 4. WORK OR EXERCISE: Has there been any recent work or exercise that involved this part of the body?      denies 5. CAUSE: What do you think is causing the leg pain?     unknown 6. OTHER SYMPTOMS: Do you have any other symptoms? (e.g., chest pain, back pain, breathing difficulty, swelling, rash, fever, numbness, weakness)     Back pain 7. PREGNANCY: Is there any chance you are pregnant? When was your last menstrual period?     na  Protocols used: Leg Pain-A-AH

## 2024-07-08 NOTE — ED Triage Notes (Signed)
 Right leg pain for 4 weeks.  Pain has moved into right buttocks.  Pain in lower back and slightly in left thigh.  Pain is random.    Unable to get appt with pcp, they suggested patient come to urgent care  Has used meloxicam.

## 2024-07-08 NOTE — Telephone Encounter (Signed)
 Medication sent to pharmacy

## 2024-07-09 ENCOUNTER — Encounter (HOSPITAL_COMMUNITY): Payer: Self-pay

## 2024-07-09 ENCOUNTER — Other Ambulatory Visit (HOSPITAL_COMMUNITY): Payer: Self-pay

## 2024-07-09 ENCOUNTER — Other Ambulatory Visit: Payer: Self-pay

## 2024-07-09 ENCOUNTER — Other Ambulatory Visit: Payer: Self-pay | Admitting: Student

## 2024-07-09 DIAGNOSIS — F411 Generalized anxiety disorder: Secondary | ICD-10-CM

## 2024-07-09 MED ORDER — TRAZODONE HCL 50 MG PO TABS
50.0000 mg | ORAL_TABLET | Freq: Every evening | ORAL | 0 refills | Status: DC | PRN
  Filled 2024-07-09: qty 30, 30d supply, fill #0

## 2024-07-09 NOTE — Telephone Encounter (Signed)
 Copied from CRM #[id number]. Topic: Clinical - Medication Refill >> Jul 09, 2024 11:23 AM Merlynn A wrote: Medication: traZODone  (DESYREL ) 50 MG tablet  Has the patient contacted their pharmacy? Yes (Agent: If no, request that the patient contact the pharmacy for the refill. If patient does not wish to contact the pharmacy document the reason why and proceed with request.) (Agent: If yes, when and what did the pharmacy advise?)  Patient was advised that there are no refills available.   This is the patient's preferred pharmacy:  [place] [facility] [address] [place] [address] Phone: [phone] Fax: [phone]  Is this the correct pharmacy for this prescription? Yes If no, delete pharmacy and type the correct one.   Has the prescription been filled recently? No  Is the patient out of the medication? Yes  Has the patient been seen for an appointment in the last year OR does the patient have an upcoming appointment? Yes  Can we respond through MyChart? Yes  Agent: Please be advised that Rx refills may take up to 3 business days. We ask that you follow-up with your pharmacy.

## 2024-07-22 DIAGNOSIS — M545 Low back pain, unspecified: Secondary | ICD-10-CM | POA: Diagnosis not present

## 2024-08-04 ENCOUNTER — Emergency Department (HOSPITAL_BASED_OUTPATIENT_CLINIC_OR_DEPARTMENT_OTHER)
Admission: EM | Admit: 2024-08-04 | Discharge: 2024-08-04 | Disposition: A | Discharge status: Home / Self Care | Attending: Emergency Medicine | Admitting: Emergency Medicine

## 2024-08-04 ENCOUNTER — Emergency Department (HOSPITAL_BASED_OUTPATIENT_CLINIC_OR_DEPARTMENT_OTHER)

## 2024-08-04 ENCOUNTER — Other Ambulatory Visit: Payer: Self-pay

## 2024-08-04 ENCOUNTER — Ambulatory Visit: Payer: Self-pay

## 2024-08-04 ENCOUNTER — Encounter (HOSPITAL_BASED_OUTPATIENT_CLINIC_OR_DEPARTMENT_OTHER): Payer: Self-pay | Admitting: Emergency Medicine

## 2024-08-04 DIAGNOSIS — R0789 Other chest pain: Secondary | ICD-10-CM | POA: Insufficient documentation

## 2024-08-04 DIAGNOSIS — Z79899 Other long term (current) drug therapy: Secondary | ICD-10-CM | POA: Diagnosis not present

## 2024-08-04 DIAGNOSIS — I10 Essential (primary) hypertension: Secondary | ICD-10-CM | POA: Insufficient documentation

## 2024-08-04 DIAGNOSIS — Z7984 Long term (current) use of oral hypoglycemic drugs: Secondary | ICD-10-CM | POA: Insufficient documentation

## 2024-08-04 DIAGNOSIS — J45909 Unspecified asthma, uncomplicated: Secondary | ICD-10-CM | POA: Insufficient documentation

## 2024-08-04 DIAGNOSIS — R0602 Shortness of breath: Secondary | ICD-10-CM | POA: Diagnosis not present

## 2024-08-04 DIAGNOSIS — Z7951 Long term (current) use of inhaled steroids: Secondary | ICD-10-CM | POA: Insufficient documentation

## 2024-08-04 DIAGNOSIS — R002 Palpitations: Secondary | ICD-10-CM | POA: Diagnosis not present

## 2024-08-04 DIAGNOSIS — E119 Type 2 diabetes mellitus without complications: Secondary | ICD-10-CM | POA: Insufficient documentation

## 2024-08-04 LAB — BASIC METABOLIC PANEL WITH GFR
Anion gap: 15 (ref 5–15)
BUN: 14 mg/dL (ref 6–20)
CO2: 25 mmol/L (ref 22–32)
Calcium: 10.1 mg/dL (ref 8.9–10.3)
Chloride: 100 mmol/L (ref 98–111)
Creatinine, Ser: 1.05 mg/dL — ABNORMAL HIGH (ref 0.44–1.00)
GFR, Estimated: >60 mL/min (ref >60)
Glucose, Bld: 191 mg/dL — ABNORMAL HIGH (ref 70–99)
Potassium: 3.6 mmol/L (ref 3.5–5.1)
Sodium: 140 mmol/L (ref 135–145)

## 2024-08-04 LAB — TROPONIN T, HIGH SENSITIVITY: Troponin T High Sensitivity: <15 ng/L (ref 0–19)

## 2024-08-04 LAB — CBC
HCT: 40.9 % (ref 36.0–46.0)
Hemoglobin: 13.3 g/dL (ref 12.0–15.0)
MCH: 29.7 pg (ref 26.0–34.0)
MCHC: 32.5 g/dL (ref 30.0–36.0)
MCV: 91.3 fL (ref 80.0–100.0)
Platelets: 276 K/uL (ref 150–400)
RBC: 4.48 MIL/uL (ref 3.87–5.11)
RDW: 14 % (ref 11.5–15.5)
WBC: 6.3 K/uL (ref 4.0–10.5)
nRBC: 0 % (ref 0.0–0.2)

## 2024-08-04 LAB — D-DIMER, QUANTITATIVE: D-Dimer, Quant: <0.27 ug{FEU}/mL (ref 0.00–0.50)

## 2024-08-04 LAB — PRO BRAIN NATRIURETIC PEPTIDE: Pro Brain Natriuretic Peptide: <50 pg/mL (ref <300.0)

## 2024-08-04 MED ORDER — SODIUM CHLORIDE 0.9 % IV BOLUS
1000.0000 mL | Freq: Once | INTRAVENOUS | Status: AC
  Administered 2024-08-04: 1000 mL via INTRAVENOUS

## 2024-08-04 NOTE — Telephone Encounter (Signed)
 Per chart, pt is currently in the ER.

## 2024-08-04 NOTE — Discharge Instructions (Addendum)
 Evaluation today was overall reassuring.  Please follow-up with your PCP and continue taking your blood pressure medicine as prescribed.  If you develop palpitations again, chest pain shortness of breath or any other concerning symptom please return to the ED for further evaluation.

## 2024-08-04 NOTE — ED Triage Notes (Signed)
 Pt c/o HTN today, and palpitations with tachycardia starting yesterday. Took b/p meds at work. Denies CP, endorses shob

## 2024-08-04 NOTE — Telephone Encounter (Signed)
 FYI Only or Action Required?: FYI only for provider.  Patient was last seen in primary care on 01/31/2024 by [name], MD.  Called Nurse Triage reporting chest palpitations and Hypertension BP 168/110.  Symptoms began today.  Interventions attempted: Nothing.  Symptoms are: gradually worsening.  Triage Disposition: Go to ED Now (Notify PCP)  Patient/caregiver understands and will follow disposition?: yes        Copied from CRM #[id number]. Topic: Clinical - Red Word Triage >> Aug 04, 2024  8:56 AM Susanna ORN wrote: Red Word that prompted transfer to Nurse Triage: Patient states she woke up Saturday morning with a headache that stayed with her until last night. States she took something & someone told her to drink something with caffeine  in it as well. Patient now states she has a weird feeling in her chest. Reason for Disposition  [1] Systolic BP >= 160 OR Diastolic >= 100 AND [2] cardiac (e.g., breathing difficulty, chest pain) or neurologic symptoms (e.g., new-onset blurred or double vision, unsteady gait)  Answer Assessment - Initial Assessment Questions 1. BLOOD PRESSURE: What is your blood pressure? Did you take at least two measurements 5 minutes apart?     168/110 HR 98 2. ONSET: When did you take your blood pressure?     Today  3. HOW: How did you take your blood pressure? (e.g., automatic home BP monitor, visiting nurse)     At work  4. HISTORY: Do you have a history of high blood pressure?     yes 5. MEDICINES: Are you taking any medicines for blood pressure? Have you missed any doses recently?     Yes/no 6. OTHER SYMPTOMS: Do you have any symptoms? (e.g., blurred vision, chest pain, difficulty breathing, headache, weakness)     Fluttering in chest headache 7. PREGNANCY: Is there any chance you are pregnant? When was your last menstrual period?     N/a  Protocols used: Blood Pressure - High-A-AH

## 2024-08-04 NOTE — ED Provider Notes (Signed)
 [place] EMERGENCY DEPARTMENT AT [facility] Provider Note   CSN: [id number] Arrival date & time: 08/04/24  0945     Patient presents with: Palpitations  HPI Crystal English is a 58 y.o. female with diabetes, HTN, HLD, asthma, fibromyalgia presenting for palpitations. Started yesterday.  She states that she felt some discomfort in the middle of her chest with palpitations yesterday afternoon.  Endorses intermittent shortness of breath.  She states the symptoms last for few minutes and then go away.  Occurred again this morning.  She also reports that she has not been keeping herself hydrated at work.  Denies vomiting diarrhea.  Denies abdominal pain.    Palpitations      Prior to Admission medications   Medication Sig Start Date End Date Taking? Authorizing Provider  atorvastatin  (LIPITOR) 10 MG tablet Take 1 tablet (10 mg total) by mouth daily. 06/04/24   Tawkaliyar, Roya, DO  Blood Glucose Monitoring Suppl (ONETOUCH VERIO) w/Device KIT PLEASE CHECK BLOOD SUGAR 3 TIMES DAILY 09/04/18   Kerman Soulier, MD  Blood Glucose Monitoring Suppl (TRUE METRIX METER) w/Device KIT Use as directed 09/14/22     cetirizine  (ZYRTEC  ALLERGY ) 10 MG tablet Take 1 tablet (10 mg total) by mouth daily. 12/24/23 12/23/24  [name], MD  cyclobenzaprine  (FLEXERIL ) 10 MG tablet Take 1 tablet (10 mg total) by mouth 2 (two) times daily as needed for muscle spasms. 07/08/24   Rising, Asberry, PA-C  fluconazole  (DIFLUCAN ) 150 MG tablet Take 1 tablet (150 mg total) by mouth daily. 01/08/24   Marylu Gee, DO  fluticasone  (FLONASE ) 50 MCG/ACT nasal spray Place 2 sprays into both nostrils daily. 12/24/23 12/23/24  [name], MD  glucose blood test strip Please check blood sugars 3 times daily 08/18/22   Mapp, Tavien, MD  hydrochlorothiazide  (HYDRODIURIL ) 25 MG tablet Take 1 tablet (25 mg total) by mouth daily. 01/31/24   [name], MD  Lancets [facility] ULTRASOFT) lancets Please check  blood sugars 3 times daily 12/31/18   Kerman Soulier, MD  losartan  (COZAAR ) 25 MG tablet Take 1 tablet (25 mg total) by mouth daily. 01/31/24 01/30/25  [name], MD  melatonin 5 MG TABS Take 1 tablet (5 mg total) by mouth at bedtime. 03/27/24   Marylu Gee, DO  metFORMIN  (GLUCOPHAGE -XR) 500 MG 24 hr tablet Take 2 tablets (1,000 mg total) by mouth 2 (two) times daily. 07/08/24   Marylu Gee, DO  traZODone  (DESYREL ) 50 MG tablet Take 1 tablet (50 mg total) by mouth at bedtime as needed for sleep. 07/09/24   Marylu Gee, DO  TRUEplus Lancets 30G MISC Use to check blood sugar 3 (three) times daily. 09/14/22       Allergies: Patient has no known allergies.    Review of Systems  Cardiovascular:  Positive for palpitations.    Updated Vital Signs BP (!) 173/110   Pulse (!) 103   Temp 98.5 F (36.9 C) (Oral)   Resp 19   Wt 94.3 kg   LMP 03/04/2014   SpO2 99%   BMI 39.30 kg/m   Physical Exam Vitals and nursing note reviewed.  HENT:     Head: Normocephalic and atraumatic.     Mouth/Throat:     Mouth: Mucous membranes are moist.  Eyes:     General:        Right eye: No discharge.        Left eye: No discharge.     Conjunctiva/sclera: Conjunctivae normal.  Cardiovascular:     Rate  and Rhythm: Regular rhythm. Tachycardia present.     Pulses: Normal pulses.     Heart sounds: Normal heart sounds.  Pulmonary:     Effort: Pulmonary effort is normal.     Breath sounds: Normal breath sounds.  Abdominal:     General: Abdomen is flat.     Palpations: Abdomen is soft.  Skin:    General: Skin is warm and dry.  Neurological:     General: No focal deficit present.  Psychiatric:        Mood and Affect: Mood normal.     (all labs ordered are listed, but only abnormal results are displayed) Labs Reviewed  BASIC METABOLIC PANEL WITH GFR - Abnormal; Notable for the following components:      Result Value   Glucose, Bld 191 (*)    Creatinine, Ser 1.05 (*)    All other  components within normal limits  CBC  D-DIMER, QUANTITATIVE  PRO BRAIN NATRIURETIC PEPTIDE  TROPONIN T, HIGH SENSITIVITY    EKG: EKG Interpretation Date/Time:  Monday August 04 2024 09:58:57 EDT Ventricular Rate:  92 PR Interval:  171 QRS Duration:  86 QT Interval:  374 QTC Calculation: 463 R Axis:   -5  Text Interpretation: Sinus rhythm Atrial premature complex Abnormal R-wave progression, late transition Borderline repolarization abnormality No significant change since last tracing Confirmed by Zackowski, Scott [phone]) on 08/04/2024 10:13:04 AM  Radiology: [facility] Chest Port 1 View Result Date: 08/04/2024 CLINICAL DATA:  Short of breath EXAM: PORTABLE CHEST 1 VIEW COMPARISON:  None Available. FINDINGS: Normal mediastinum and cardiac silhouette. Normal pulmonary vasculature. No evidence of effusion, infiltrate, or pneumothorax. No acute bony abnormality. IMPRESSION: No acute cardiopulmonary process. Electronically Signed   By: Jackquline Boxer M.D.   On: 08/04/2024 10:54     Procedures   Medications Ordered in the ED  sodium chloride  0.9 % bolus 1,000 mL (1,000 mLs Intravenous New Bag/Given 08/04/24 1049)                                    Medical Decision Making Amount and/or Complexity of Data Reviewed Labs: ordered. Radiology: ordered.   Initial Impression and Ddx 58 year old well-appearing female presenting for palpitations.  Exam notable for tachycardia but otherwise reassuring.  DDx includes ACS, PE, arrhythmia, electrolyte derangement, dehydration, infection, other. Patient PMH that increases complexity of ED encounter: diabetes, HTN, HLD, asthma, fibromyalgia   Interpretation of Diagnostics - I independent reviewed and interpreted the labs as followed: mild hyperlgycemia 191, negative troponin and d dimer  - I independently visualized the following imaging with scope of interpretation limited to determining acute life threatening conditions related to emergency  care: CXR, which revealed no acute changes  - I personally reviewed and interpreted EKG which revealed sinus rhythm without of ischemia  Patient Reassessment and Ultimate Disposition/Management On review assessment patient remained asymptomatic.  Heart rate realized after fluids.  Workup unremarkable.  Advised to follow-up with her PCP.  Blood pressure slightly elevated here but no suggestions of hypertensive emergency.  Advised that she continue taking her blood pressure medicine as prescribed.  Advised PCP follow-up.  Discussed return precautions.  Discharged.  Patient management required discussion with the following services or consulting groups:  None  Complexity of Problems Addressed Acute complicated illness or Injury  Additional Data Reviewed and Analyzed Further history obtained from: Past medical history and medications listed in the EMR and Prior ED visit  notes  Patient Encounter Risk Assessment Consideration of hospitalization      Final diagnoses:  Palpitations    ED Discharge Orders     None          Lang Norleen POUR, PA-C 08/04/24 1151    Geraldene Hamilton, MD 08/05/24 [phone]

## 2024-08-11 ENCOUNTER — Ambulatory Visit: Payer: Self-pay

## 2024-08-11 NOTE — Telephone Encounter (Signed)
 RTC to patient to check on her leg pain.  Message left that the Clinics had returned her call.

## 2024-08-11 NOTE — Telephone Encounter (Signed)
 FYI Only or Action Required?: FYI only for provider.  Patient was last seen in primary care on 01/31/2024 by [name], MD.  Called Nurse Triage reporting Leg Pain.  Symptoms began about a month ago.  Interventions attempted: Prescription medications: meloxicam, muscle relaxer.  Symptoms are: gradually worsening.  Triage Disposition: See HCP Within 4 Hours (Or PCP Triage)  Patient/caregiver understands and will follow disposition?: Unsure       Copied from CRM #[id number]. Topic: Clinical - Red Word Triage >> Aug 11, 2024  9:20 AM Antonio DEL wrote: Red Word that prompted transfer to Nurse Triage: Pain in legs, mostly right leg. Having cramps at night in legs, has never happened before. Also, had some pain in her back on yesterday and having pain in buttocks as well. Reason for Disposition  [1] Thigh or calf pain AND [2] only 1 side AND [3] present > 1 hour  (Exception: Chronic unchanged pain.)  Answer Assessment - Initial Assessment Questions 1. ONSET: When did the pain start?      X 1 mo 2. LOCATION: Where is the pain located?      Bilateral, R>L It feels heavy when I'm walking 3. PAIN: How bad is the pain?    (Scale 1-10; or mild, moderate, severe)     Pain varies, 5-8/10 Taken meloxicam, muscle relaxer with relief - pt endorses able to go to sleep after that Endorses calf cramps at nighttime 4. WORK OR EXERCISE: Has there been any recent work or exercise that involved this part of the body?      denies 5. CAUSE: What do you think is causing the leg pain?     unknown 6. OTHER SYMPTOMS: Do you have any other symptoms? (e.g., chest pain, back pain, breathing difficulty, swelling, rash, fever, numbness, weakness)     Radiates to bilateral buttocks, back pain 7. PREGNANCY: Is there any chance you are pregnant? When was your last menstrual period?     N/a    [facility] offered to schedule with [facility], but pt declined and wishes to schedule herself. Patient  verbalized understanding and to call back/911 with worsening symptoms.  Protocols used: Leg Pain-A-AH

## 2024-08-14 ENCOUNTER — Other Ambulatory Visit: Payer: Self-pay

## 2024-08-14 ENCOUNTER — Other Ambulatory Visit: Payer: Self-pay | Admitting: Student

## 2024-08-14 DIAGNOSIS — F411 Generalized anxiety disorder: Secondary | ICD-10-CM

## 2024-08-15 ENCOUNTER — Other Ambulatory Visit: Payer: Self-pay

## 2024-08-15 ENCOUNTER — Other Ambulatory Visit (HOSPITAL_COMMUNITY): Payer: Self-pay

## 2024-08-15 MED FILL — Trazodone HCl Tab 50 MG: ORAL | 30 days supply | Qty: 30 | Fill #0 | Status: CN

## 2024-08-18 ENCOUNTER — Other Ambulatory Visit: Payer: Self-pay

## 2024-08-18 ENCOUNTER — Encounter (HOSPITAL_BASED_OUTPATIENT_CLINIC_OR_DEPARTMENT_OTHER): Payer: Self-pay | Admitting: Urology

## 2024-08-18 ENCOUNTER — Emergency Department (HOSPITAL_BASED_OUTPATIENT_CLINIC_OR_DEPARTMENT_OTHER): Admitting: Radiology

## 2024-08-18 ENCOUNTER — Emergency Department (HOSPITAL_BASED_OUTPATIENT_CLINIC_OR_DEPARTMENT_OTHER)
Admission: EM | Admit: 2024-08-18 | Discharge: 2024-08-18 | Disposition: A | Discharge status: Home / Self Care | Source: Ambulatory Visit

## 2024-08-18 ENCOUNTER — Ambulatory Visit: Payer: Self-pay | Admitting: Student

## 2024-08-18 DIAGNOSIS — R0789 Other chest pain: Secondary | ICD-10-CM | POA: Insufficient documentation

## 2024-08-18 DIAGNOSIS — R2241 Localized swelling, mass and lump, right lower limb: Secondary | ICD-10-CM | POA: Diagnosis not present

## 2024-08-18 DIAGNOSIS — I10 Essential (primary) hypertension: Secondary | ICD-10-CM | POA: Diagnosis not present

## 2024-08-18 DIAGNOSIS — E119 Type 2 diabetes mellitus without complications: Secondary | ICD-10-CM | POA: Diagnosis not present

## 2024-08-18 DIAGNOSIS — Z7984 Long term (current) use of oral hypoglycemic drugs: Secondary | ICD-10-CM | POA: Insufficient documentation

## 2024-08-18 DIAGNOSIS — Z79899 Other long term (current) drug therapy: Secondary | ICD-10-CM | POA: Diagnosis not present

## 2024-08-18 LAB — D-DIMER, QUANTITATIVE: D-Dimer, Quant: <0.27 ug{FEU}/mL (ref 0.00–0.50)

## 2024-08-18 LAB — BASIC METABOLIC PANEL WITH GFR
Anion gap: 14 (ref 5–15)
BUN: 16 mg/dL (ref 6–20)
CO2: 24 mmol/L (ref 22–32)
Calcium: 9.9 mg/dL (ref 8.9–10.3)
Chloride: 100 mmol/L (ref 98–111)
Creatinine, Ser: 0.98 mg/dL (ref 0.44–1.00)
GFR, Estimated: >60 mL/min (ref >60)
Glucose, Bld: 212 mg/dL — ABNORMAL HIGH (ref 70–99)
Potassium: 4 mmol/L (ref 3.5–5.1)
Sodium: 138 mmol/L (ref 135–145)

## 2024-08-18 LAB — CBC
HCT: 43 % (ref 36.0–46.0)
Hemoglobin: 13.6 g/dL (ref 12.0–15.0)
MCH: 29.6 pg (ref 26.0–34.0)
MCHC: 31.6 g/dL (ref 30.0–36.0)
MCV: 93.5 fL (ref 80.0–100.0)
Platelets: 284 K/uL (ref 150–400)
RBC: 4.6 MIL/uL (ref 3.87–5.11)
RDW: 13.7 % (ref 11.5–15.5)
WBC: 6.9 K/uL (ref 4.0–10.5)
nRBC: 0 % (ref 0.0–0.2)

## 2024-08-18 LAB — TROPONIN T, HIGH SENSITIVITY: Troponin T High Sensitivity: <15 ng/L (ref 0–19)

## 2024-08-18 NOTE — ED Triage Notes (Signed)
 Pt states pcp told her to come to ER for Chest pain  States chest pain intermittent that started today  Also states found lump on right lower leg and states concern for blood clot  States has been having more leg pain with walking

## 2024-08-18 NOTE — Discharge Instructions (Addendum)
 Evaluation today was reassuring.  Please follow-up with your PCP.  If your symptoms return please return to the ED for further evaluation.

## 2024-08-18 NOTE — Telephone Encounter (Signed)
 FYI Only or Action Required?: FYI only for provider.  Patient was last seen in primary care on 01/31/2024 by [name], MD.  Called Nurse Triage reporting Leg Pain.  Symptoms began several months ago.  Triage Disposition: See HCP Within 4 Hours (Or PCP Triage) (overriding See PCP Within 2 Weeks)  Patient/caregiver understands and will follow disposition?:                Copied from CRM #[id number]. Topic: Clinical - Red Word Triage >> Aug 18, 2024 12:03 PM Crystal English wrote: Red Word that prompted transfer to Nurse Triage: pain when walking/RT leg has knot in it.  also talked to NT on 09/22 Notes: Red Word that prompted transfer to Nurse Triage: Pain in legs, mostly right leg. Having cramps at night in legs, has never happened before. Also, had some pain in her back on yesterday and having pain in buttocks as well. Reason for Disposition  [1] MILD pain (e.g., does not interfere with normal activities) AND [2] present > 7 days  Answer Assessment - Initial Assessment Questions This RN recommends pt is seen today. Pt states she is going to go to the [facility] after work. Pt wants to scheduled an office visit as well as she started a new job today and has to let them know the times she needs off. This RN scheduled pt for an appt on 10/9 in office.  ONSET: When did the pain start?      A couple of months; started off really bad  LOCATION: Where is the pain located?      Knot on right leg, right below the knee (2 inches below knee)  PAIN: How bad is the pain?    (Scale 1-10; or mild, moderate, severe)      Denies pain in the knot; pain changes in legs      OTHER SYMPTOMS: Do you have any other symptoms? (e.g., chest pain, back pain, breathing difficulty, swelling, rash, fever, numbness, weakness)     When bend over, back may catch (pt fell down stairs a few years ago and injured L4 and L5); chest pain (just started now 3 mins ago, 2/10 pain level),  intermittent throbbing pain in buttocks   Denies difficulty breathing Denies warm to touch Denies swelling  Protocols used: Leg Pain-A-AH

## 2024-08-18 NOTE — ED Provider Notes (Signed)
 [place] EMERGENCY DEPARTMENT AT [facility] Provider Note   CSN: [id number] Arrival date & time: 08/18/24  1704     Patient presents with: Chest Pain and Leg Pain  HPI Crystal English is a 58 y.o. female with hypertension, diabetes, fibromyalgia, hyperlipidemia presenting for chest pain.  Started earlier this morning.  She states she was on the phone talking to her primary care office to schedule an appointment when she felt anxious and then pain all about the front of her chest.  Nonradiating otherwise.  It has been intermittent without shortness of breath.  It is not emergent exertional.  She denies chest pain at this time.  Also reports a bump on the right medial aspect of the mid shin.  She states that her sister has had a DVT before and this bump is concerning to her.  Denies OCP use, calf tenderness or swelling    Chest Pain Leg Pain      Prior to Admission medications   Medication Sig Start Date End Date Taking? Authorizing Provider  atorvastatin  (LIPITOR) 10 MG tablet Take 1 tablet (10 mg total) by mouth daily. 06/04/24   Tawkaliyar, Roya, DO  Blood Glucose Monitoring Suppl (ONETOUCH VERIO) w/Device KIT PLEASE CHECK BLOOD SUGAR 3 TIMES DAILY 09/04/18   Kerman Soulier, MD  Blood Glucose Monitoring Suppl (TRUE METRIX METER) w/Device KIT Use as directed 09/14/22     cetirizine  (ZYRTEC  ALLERGY ) 10 MG tablet Take 1 tablet (10 mg total) by mouth daily. 12/24/23 12/23/24  [name], MD  cyclobenzaprine  (FLEXERIL ) 10 MG tablet Take 1 tablet (10 mg total) by mouth 2 (two) times daily as needed for muscle spasms. 07/08/24   Rising, Asberry, PA-C  fluconazole  (DIFLUCAN ) 150 MG tablet Take 1 tablet (150 mg total) by mouth daily. 01/08/24   Marylu Gee, DO  fluticasone  (FLONASE ) 50 MCG/ACT nasal spray Place 2 sprays into both nostrils daily. 12/24/23 12/23/24  [name], MD  glucose blood test strip Please check blood sugars 3 times daily 08/18/22   Mapp, Tavien,  MD  hydrochlorothiazide  (HYDRODIURIL ) 25 MG tablet Take 1 tablet (25 mg total) by mouth daily. 01/31/24   [name], MD  Lancets [facility] ULTRASOFT) lancets Please check blood sugars 3 times daily 12/31/18   Kerman Soulier, MD  losartan  (COZAAR ) 25 MG tablet Take 1 tablet (25 mg total) by mouth daily. 01/31/24 01/30/25  [name], MD  melatonin 5 MG TABS Take 1 tablet (5 mg total) by mouth at bedtime. 03/27/24   Marylu Gee, DO  metFORMIN  (GLUCOPHAGE -XR) 500 MG 24 hr tablet Take 2 tablets (1,000 mg total) by mouth 2 (two) times daily. 07/08/24   Marylu Gee, DO  traZODone  (DESYREL ) 50 MG tablet Take 1 tablet (50 mg total) by mouth at bedtime as needed for sleep. 08/15/24   Marylu Gee, DO  TRUEplus Lancets 30G MISC Use to check blood sugar 3 (three) times daily. 09/14/22       Allergies: Patient has no known allergies.    Review of Systems  Cardiovascular:  Positive for chest pain.    Updated Vital Signs BP (!) 146/103   Pulse 76   Temp 97.9 F (36.6 C)   Resp 18   Ht 5' 1 (1.549 m)   Wt 94.3 kg   LMP 03/04/2014   SpO2 94%   BMI 39.28 kg/m   Physical Exam Vitals and nursing note reviewed.  HENT:     Head: Normocephalic and atraumatic.     Mouth/Throat:  Mouth: Mucous membranes are moist.  Eyes:     General:        Right eye: No discharge.        Left eye: No discharge.     Conjunctiva/sclera: Conjunctivae normal.  Cardiovascular:     Rate and Rhythm: Normal rate and regular rhythm.     Pulses: Normal pulses.     Heart sounds: Normal heart sounds.  Pulmonary:     Effort: Pulmonary effort is normal.     Breath sounds: Normal breath sounds.  Abdominal:     General: Abdomen is flat.     Palpations: Abdomen is soft.  Musculoskeletal:       Legs:     Comments: No calf tenderness with palpation.  No erythema or edema noted in the legs.  Skin:    General: Skin is warm and dry.  Neurological:     General: No focal deficit present.  Psychiatric:         Mood and Affect: Mood normal.     (all labs ordered are listed, but only abnormal results are displayed) Labs Reviewed  BASIC METABOLIC PANEL WITH GFR - Abnormal; Notable for the following components:      Result Value   Glucose, Bld 212 (*)    All other components within normal limits  CBC  D-DIMER, QUANTITATIVE  TROPONIN T, HIGH SENSITIVITY    EKG: EKG Interpretation Date/Time:  Monday August 18 2024 17:17:19 EDT Ventricular Rate:  84 PR Interval:  174 QRS Duration:  82 QT Interval:  360 QTC Calculation: 425 R Axis:   15  Text Interpretation: Normal sinus rhythm Low voltage QRS Nonspecific T wave abnormality Abnormal ECG When compared with ECG of 04-Aug-2024 09:58, PREVIOUS ECG IS PRESENT Confirmed by Neysa Clap [phone]) on 08/18/2024 7:02:43 PM  Radiology: [facility] Chest 2 View Result Date: 08/18/2024 CLINICAL DATA:  chest pain EXAM: CHEST - 2 VIEW COMPARISON:  08/04/2024 FINDINGS: Lower lung volumes. No focal airspace consolidation, pleural effusion, or pneumothorax. No cardiomegaly.Tortuous aorta.No acute fracture or destructive lesion. Multilevel thoracic osteophytosis. IMPRESSION: No acute cardiopulmonary abnormality. Electronically Signed   By: Rogelia Myers M.D.   On: 08/18/2024 17:42     Procedures   Medications Ordered in the ED - No data to display              HEART Score: 2                    Medical Decision Making Amount and/or Complexity of Data Reviewed Labs: ordered. Radiology: ordered.   58 year old well-appearing female presenting for chest pain and leg lesion.  Exam notable for a tiny nodular lesion about the medial right mid shin.  Considered ACS and PE but unlikely given reassuring exam and patient has been asymptomatic on serial reassessments.  Also negative D-dimer and troponins.  I personally reviewed and interpreted EKG which revealed normal sinus rhythm.  Also considered DVT but unlikely given lack of symptoms suspect the nodular  lesion could be a cyst.  Overall workup was reassuring.  Advised PCP follow-up.  Discussed return precautions.  Discharged good condition.     Final diagnoses:  Atypical chest pain  Nodule of skin of right lower leg    ED Discharge Orders     None          Lang Norleen POUR, PA-C 08/18/24 2119    Neysa Clap PARAS, DO 08/18/24 2351

## 2024-08-20 ENCOUNTER — Other Ambulatory Visit: Payer: Self-pay | Admitting: Student

## 2024-08-20 ENCOUNTER — Other Ambulatory Visit (HOSPITAL_COMMUNITY): Payer: Self-pay

## 2024-08-20 DIAGNOSIS — F411 Generalized anxiety disorder: Secondary | ICD-10-CM

## 2024-08-20 NOTE — Telephone Encounter (Signed)
 Medication last refilled 08/15/24.

## 2024-08-21 ENCOUNTER — Other Ambulatory Visit (HOSPITAL_COMMUNITY): Payer: Self-pay

## 2024-08-21 ENCOUNTER — Encounter (HOSPITAL_COMMUNITY): Payer: Self-pay

## 2024-08-26 ENCOUNTER — Other Ambulatory Visit (HOSPITAL_COMMUNITY): Payer: Self-pay

## 2024-08-28 ENCOUNTER — Ambulatory Visit: Payer: Self-pay | Admitting: Student

## 2024-09-01 ENCOUNTER — Other Ambulatory Visit (HOSPITAL_COMMUNITY): Payer: Self-pay

## 2024-09-01 ENCOUNTER — Other Ambulatory Visit: Payer: Self-pay

## 2024-09-01 MED FILL — Trazodone HCl Tab 50 MG: ORAL | 30 days supply | Qty: 30 | Fill #0 | Status: AC

## 2024-09-05 ENCOUNTER — Telehealth: Payer: Self-pay | Admitting: *Deleted

## 2024-09-05 NOTE — Telephone Encounter (Signed)
 RTC to patient message left that she can drop the DMV forms off at anytime.  We close at 12 noon today.   She will be called when the forms are available.   Copied from CRM [id number]. Topic: General - Other >> Sep 05, 2024  9:33 AM [name] wrote: Reason for CRM: PT requesting to drop off DMV forms for plaquer for renew of handicap . Needs call back when she can drop off paperwork. [phone]

## 2024-09-12 ENCOUNTER — Telehealth: Payer: Self-pay

## 2024-09-12 NOTE — Telephone Encounter (Addendum)
 I contacted pt to let her know that her disability parking placard has been completed and ready for pick up, but no answer. I left detailed message to call the clinic back.

## 2024-09-23 NOTE — Telephone Encounter (Signed)
 Will forward message to [facility] [employer] to f/u with the forms.   Copied from CRM #[id number]. Topic: General - Other >> Sep 22, 2024  4:28 PM Debby BROCKS wrote: Reason for CRM: Patient would like to know if the accessibility forms that she dropped off over 2 weeks ago have been finished or not as she has not received a call to pick them up

## 2024-09-24 NOTE — Telephone Encounter (Signed)
 Unable to reach pt to let her know that her form has been completed and ready for pick up. I left detailed message to call the clinic back.

## 2024-09-26 NOTE — Telephone Encounter (Signed)
 Patient will be coming today before noon to pick up her Placard form.

## 2024-10-07 ENCOUNTER — Other Ambulatory Visit: Payer: Self-pay | Admitting: Student

## 2024-10-07 ENCOUNTER — Telehealth: Payer: Self-pay | Admitting: *Deleted

## 2024-10-07 ENCOUNTER — Other Ambulatory Visit: Payer: Self-pay

## 2024-10-07 DIAGNOSIS — F411 Generalized anxiety disorder: Secondary | ICD-10-CM

## 2024-10-07 NOTE — Telephone Encounter (Signed)
 Patient was identified as falling into the [place] Measure - Diabetes.   Patient was: Appointment already scheduled for:  10/08/24.

## 2024-10-08 ENCOUNTER — Other Ambulatory Visit: Payer: Self-pay | Admitting: Student

## 2024-10-08 ENCOUNTER — Encounter (HOSPITAL_COMMUNITY): Payer: Self-pay

## 2024-10-08 ENCOUNTER — Other Ambulatory Visit: Payer: Self-pay

## 2024-10-08 ENCOUNTER — Other Ambulatory Visit (HOSPITAL_COMMUNITY): Payer: Self-pay

## 2024-10-08 ENCOUNTER — Ambulatory Visit: Admitting: Student

## 2024-10-08 DIAGNOSIS — F411 Generalized anxiety disorder: Secondary | ICD-10-CM

## 2024-10-08 NOTE — Telephone Encounter (Signed)
Duplicate medication refill request

## 2024-10-09 ENCOUNTER — Other Ambulatory Visit (HOSPITAL_COMMUNITY): Payer: Self-pay

## 2024-10-09 MED ORDER — TRAZODONE HCL 50 MG PO TABS
50.0000 mg | ORAL_TABLET | Freq: Every evening | ORAL | 0 refills | Status: DC | PRN
  Filled 2024-10-09: qty 30, 30d supply, fill #0

## 2024-10-31 ENCOUNTER — Other Ambulatory Visit (HOSPITAL_COMMUNITY): Payer: Self-pay

## 2024-10-31 ENCOUNTER — Ambulatory Visit: Admitting: Student

## 2024-10-31 VITALS — BP 185/119 | HR 80 | Temp 98.4°F | Ht 60.0 in | Wt 207.8 lb

## 2024-10-31 DIAGNOSIS — Z7984 Long term (current) use of oral hypoglycemic drugs: Secondary | ICD-10-CM

## 2024-10-31 DIAGNOSIS — E119 Type 2 diabetes mellitus without complications: Secondary | ICD-10-CM

## 2024-10-31 DIAGNOSIS — F411 Generalized anxiety disorder: Secondary | ICD-10-CM

## 2024-10-31 DIAGNOSIS — I10 Essential (primary) hypertension: Secondary | ICD-10-CM

## 2024-10-31 DIAGNOSIS — Z0000 Encounter for general adult medical examination without abnormal findings: Secondary | ICD-10-CM

## 2024-10-31 LAB — POCT GLYCOSYLATED HEMOGLOBIN (HGB A1C): HbA1c, POC (controlled diabetic range): 8 % — AB (ref 0.0–7.0)

## 2024-10-31 LAB — GLUCOSE, CAPILLARY: Glucose-Capillary: 162 mg/dL — ABNORMAL HIGH (ref 70–99)

## 2024-10-31 MED ORDER — SERTRALINE HCL 25 MG PO TABS
25.0000 mg | ORAL_TABLET | Freq: Every day | ORAL | 0 refills | Status: DC
  Filled 2024-10-31: qty 60, 60d supply, fill #0

## 2024-10-31 MED ORDER — AMLODIPINE-OLMESARTAN 10-40 MG PO TABS
1.0000 | ORAL_TABLET | Freq: Every day | ORAL | 0 refills | Status: DC
  Filled 2024-10-31: qty 30, 30d supply, fill #0

## 2024-10-31 NOTE — Progress Notes (Signed)
 CC: Follow-up  HPI:  [name] is a 58 y.o. female living with a history stated below and presents today for follow-up. Please see problem based assessment and plan for additional details.  Past Medical History:  Diagnosis Date   Agatston coronary artery calcium  score less than 100    6.3 coronary Ca score 11/2021 CT   Allergic rhinitis    Anxiety    Asthma    Required inhaled corticosteroid in past.    Benign paroxysmal positional vertigo, unspecified laterality    Body mass index (BMI) 40.0-44.9, adult (HCC)    Cervical radiculopathy    Daily headache    Depression, major 02/25/2013   First episode that required was 2014. Prior episodes that did not require pt to seek medical tx.   DM (diabetes mellitus) (HCC)    Fibromyalgia    Hyperlipidemia    Hypertension    2 drug therapy   Left knee pain 01/15/2020   Low back pain 08/10/2016   Morbid obesity due to excess calories (HCC)    Nausea 09/19/2018   OSA (obstructive sleep apnea)    Sleep study 12/2006 : Moderately Severe OSA. AHI 38.8. O2 sat decreased to 71%.   Primary insomnia    Severe obesity (BMI >= 40) (HCC) 10/08/2012   Obesity Class 3. BMI > 40.    Strain of flexor muscle of right hip 09/19/2018   Tobacco abuse    Urge incontinence     Medications Ordered Prior to Encounter[1]  Family History  Problem Relation Age of Onset   Diabetes Mother    Hypertension Mother    Arthritis Mother    Hyperlipidemia Mother    Hypertension Father    Stroke Father        in his 47's   Heart disease Father        died <47 y.o   Heart disease Sister    Heart disease Brother    Heart disease Maternal Grandmother    Stomach cancer Maternal Grandmother 73   Diabetes Maternal Aunt    Breast cancer Maternal Aunt 60   Asthma Maternal Aunt    Prostate cancer Maternal Uncle     Social History   Socioeconomic History   Marital status: Single    Spouse name: Not on file   Number of children: 3   Years of  education: some coll.   Highest education level: Not on file  Occupational History   Occupation: Claims processing    Employer: [employer]  Tobacco Use   Smoking status: Former    Current packs/day: 0.00    Average packs/day: (0.9 ttl pk-yrs)    Types: Cigarettes    Start date: 05/20/1985    Quit date: 05/21/2015    Years since quitting: 9.4    Passive exposure: Never   Smokeless tobacco: Never  Vaping Use   Vaping status: Never Used  Substance and Sexual Activity   Alcohol use: Yes    Comment: rarely.   Drug use: No   Sexual activity: Not Currently    Partners: Male    Birth control/protection: Surgical    Comment: tubal  Other Topics Concern   Not on file  Social History Narrative   Lives in [place] alone. 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   Social Drivers of Health   Tobacco Use: Medium Risk (08/18/2024)   Patient History    Smoking Tobacco Use: Former    Smokeless Tobacco Use: Never  Passive Exposure: Never  Physicist, Medical Strain: Not on file  Food Insecurity: No Food Insecurity (07/31/2023)   Hunger Vital Sign    Worried About Running Out of Food in the Last Year: Never true    Ran Out of Food in the Last Year: Never true  Transportation Needs: No Transportation Needs (07/31/2023)   PRAPARE - Administrator, Civil Service (Medical): No    Lack of Transportation (Non-Medical): No  Physical Activity: Not on file  Stress: Not on file  Social Connections: Not on file  Intimate Partner Violence: Not At Risk (07/31/2023)   Humiliation, Afraid, Rape, and Kick questionnaire    Fear of Current or Ex-Partner: No    Emotionally Abused: No    Physically Abused: No    Sexually Abused: No  Depression (PHQ2-9): Low Risk (07/31/2023)   Depression (PHQ2-9)    PHQ-2 Score: 2  Alcohol Screen: Not on file  Housing: Low Risk (07/31/2023)   Housing    Last Housing Risk Score: 0  Utilities: Not on file  Health Literacy: Not on file    Review  of Systems: ROS negative except for what is noted on the assessment and plan.  Vitals:   10/31/24 1052 10/31/24 1150  BP: (!) 161/105 (!) 185/119  Pulse: 85 80  Temp: 98.4 F (36.9 C)   TempSrc: Oral   SpO2: 100%   Weight: 207 lb 12.8 oz (94.3 kg)   Height: 5' (1.524 m)     Physical Exam: Constitutional: obese, sitting in chair, in no acute distress Cardiovascular: regular rate and rhythm, no m/r/g Pulmonary/Chest: normal work of breathing on room air, lungs clear to auscultation bilaterally Abdominal: soft, non-tender, non-distended MSK: normal bulk and tone Skin: warm and dry Psych: normal mood and behavior  Assessment & Plan:   Patient discussed with Dr. CHARLENA English  Essential hypertension BP is 161/105 and 185/119. There is definitely a component of white coat hypertension which she endorsed prior to re-check. Also dealing with increased anxiety/panic attacks recently. Does not routinely check BP at home. Currently managed compliantly with Losartan  25 and hydrochlorothiazide  25. She used to be on Zestoretic  but increase in dose prompted switch to separate medications. She desires to be on one pill. Will change to Azor  10-40 today and she will return in one month for BMP/BP management. Denies, HA, visual changes, does have recent chest pain but this has been in the setting of anxiety/panic attacks.  Diabetes mellitus (HCC) A1c is 8.0. Managed with max Metformin . Previously stopped Ozempic  due to metallic taste in mouth. Will address lifestyle changes for now. Microalbumin: Creatinine today and Ophthalmology referral. Deferred foot exam.  GAD (generalized anxiety disorder) Increasingly problematic for her. Has been seen a few times for atypical chest pain and it seems these are related to panic attacks. He currently takes Trazodone  at night for sleep. Denies SI/HI. Will start Zoloft  25 mg today and I have also sent a BH referral. She will follow-up next month. I explained the fact  that will take 4-6 weeks for Zoloft  to fully work.   Healthcare maintenance Colonoscopy referral. She has upcoming appointment with OBGYN for Pap smear.   Norman Lobstein, D.O. [facility], PGY-2 Date 11/03/2024 Time 12:38 PM     [1]  Current Outpatient Medications on File Prior to Visit  Medication Sig Dispense Refill   atorvastatin  (LIPITOR) 10 MG tablet Take 1 tablet (10 mg total) by mouth daily. 90 tablet 0   Blood Glucose  Monitoring Suppl (ONETOUCH VERIO) w/Device KIT PLEASE CHECK BLOOD SUGAR 3 TIMES DAILY 1 kit 0   Blood Glucose Monitoring Suppl (TRUE METRIX METER) w/Device KIT Use as directed 1 kit 0   cyclobenzaprine  (FLEXERIL ) 10 MG tablet Take 1 tablet (10 mg total) by mouth 2 (two) times daily as needed for muscle spasms. 20 tablet 0   fluconazole  (DIFLUCAN ) 150 MG tablet Take 1 tablet (150 mg total) by mouth daily. 2 tablet 0   glucose blood test strip Please check blood sugars 3 times daily 100 each 1   Lancets (ONETOUCH ULTRASOFT) lancets Please check blood sugars 3 times daily 100 each 12   melatonin 5 MG TABS Take 1 tablet (5 mg total) by mouth at bedtime. 30 tablet 0   metFORMIN  (GLUCOPHAGE -XR) 500 MG 24 hr tablet Take 2 tablets (1,000 mg total) by mouth 2 (two) times daily. 360 tablet 1   traZODone  (DESYREL ) 50 MG tablet Take 1 tablet (50 mg total) by mouth at bedtime as needed for sleep. 30 tablet 0   TRUEplus Lancets 30G MISC Use to check blood sugar 3 (three) times daily. 100 each 0   No current facility-administered medications on file prior to visit.

## 2024-11-01 LAB — MICROALBUMIN / CREATININE URINE RATIO
Creatinine, Urine: 75.8 mg/dL
Microalb/Creat Ratio: 16 mg/g{creat} (ref 0–29)
Microalbumin, Urine: 12 ug/mL

## 2024-11-03 NOTE — Assessment & Plan Note (Signed)
 Increasingly problematic for her. Has been seen a few times for atypical chest pain and it seems these are related to panic attacks. He currently takes Trazodone  at night for sleep. Denies SI/HI. Will start Zoloft  25 mg today and I have also sent a BH referral. She will follow-up next month. I explained the fact that will take 4-6 weeks for Zoloft  to fully work.

## 2024-11-03 NOTE — Assessment & Plan Note (Signed)
 Colonoscopy referral. She has upcoming appointment with OBGYN for Pap smear.

## 2024-11-03 NOTE — Assessment & Plan Note (Addendum)
 BP is 161/105 and 185/119. There is definitely a component of white coat hypertension which she endorsed prior to re-check. Also dealing with increased anxiety/panic attacks recently. Does not routinely check BP at home. Currently managed compliantly with Losartan  25 and hydrochlorothiazide  25. She used to be on Zestoretic  but increase in dose prompted switch to separate medications. She desires to be on one pill. Will change to Azor  10-40 today and she will return in one month for BMP/BP management. Denies, HA, visual changes, does have recent chest pain but this has been in the setting of anxiety/panic attacks.

## 2024-11-03 NOTE — Assessment & Plan Note (Addendum)
 A1c is 8.0. Managed with max Metformin . Previously stopped Ozempic  due to metallic taste in mouth. Will address lifestyle changes for now. Microalbumin: Creatinine today and Ophthalmology referral. Deferred foot exam.

## 2024-11-04 ENCOUNTER — Other Ambulatory Visit (HOSPITAL_COMMUNITY): Payer: Self-pay

## 2024-11-06 ENCOUNTER — Ambulatory Visit: Payer: Self-pay | Admitting: Student

## 2024-11-06 NOTE — Progress Notes (Signed)
 Internal Medicine Clinic Attending  Case discussed with the resident at the time of the visit.  We reviewed the resident's history and exam and pertinent patient test results.  I agree with the assessment, diagnosis, and plan of care documented in the resident's note.

## 2024-11-12 ENCOUNTER — Ambulatory Visit: Payer: Self-pay

## 2024-11-12 ENCOUNTER — Other Ambulatory Visit: Payer: Self-pay

## 2024-11-12 VITALS — BP 138/94 | HR 96 | Temp 98.8°F | Ht 60.0 in | Wt 202.4 lb

## 2024-11-12 DIAGNOSIS — Z20828 Contact with and (suspected) exposure to other viral communicable diseases: Secondary | ICD-10-CM

## 2024-11-12 NOTE — Progress Notes (Signed)
 "  Patient name: Crystal English Date of birth: 08-Sep-1966 Date of visit: 11/16/2024  Type of visit: Acute Office Visit   Subjective   Chief concern:  Chief Complaint  Patient presents with   Acute Visit     SICK VISIT  POSSIBLE  FLU    Follow-up    Request flu test / grandson been tested / patient states she do not take the flu shot    Crystal English is a 58 y.o. female with a history of DMII, HTN, GAD/MDD, OSA, asthma, tobacco use disorder, HLD, and BPPV who presents to [facility] clinic for evaluation of cough, fever, and myalgias.  Patient presents to clinic with concerns of cough, fever, and myalgias that have been ongoing since this past Monday 12/22.  She reports that her grandson stayed with her over the weekend and he developed a fever on Saturday 12/20.  He also developed a cough, body aches, and diarrhea.  He was taken by his mom to the doctor on Monday and was found to be influenza A positive.  Patient reports that on Monday morning she began to experience a cough and throughout the day she began experiencing headache, fatigue, body aches, and felt feverish with a Tmax of 101.66F.  She has been utilizing NyQuil cold and flu at night to help with her sleep, and reports that yesterday her symptoms had slightly improved though she did feel feverish with a temperature of 36F.  Today she is slightly improved.   ROS: Negative unless otherwise listed in the HPI.  Patient Active Problem List   Diagnosis Date Noted   Exposure to influenza 11/16/2024   Facial swelling 01/31/2024   Instability of both knee joints 01/31/2024   Sensation of knee instability 01/31/2024   Chest pain 01/29/2024   GAD (generalized anxiety disorder) 01/29/2024   Vaginal candidiasis 01/09/2024   Upper respiratory symptom 12/24/2023   Pelvic organ prolapse quantification stage 2 cystocele 10/10/2023   Nocturia 10/10/2023   Constipation 10/10/2023   Posterior right knee pain 04/25/2023    Hyperlipidemia 08/17/2022   Agatston coronary artery calcium  score less than 100 12/01/2021   BPPV (benign paroxysmal positional vertigo) 08/11/2020   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   Adjustment disorder with anxious mood 11/04/2019   Anxiety 04/03/2019   Urge urinary incontinence 10/12/2018   Dyspepsia 09/19/2018   Fibromyalgia 04/14/2018   Allergic rhinitis 05/04/2017   Migraine 08/10/2016   Diabetes mellitus (HCC) 02/14/2016   Perimenopause 12/23/2014   Chronic urticaria 04/08/2014   Cervical disc disorder with radiculopathy of cervical region 04/08/2014   Acute left-sided thoracic back pain 03/11/2013   Depression, major 02/25/2013   Healthcare maintenance 12/16/2012   Tobacco abuse 10/08/2012   Severe obesity (BMI 35.0-35.9 with comorbidity) (HCC) 10/08/2012   Asthma, chronic 03/09/2010   OSA (obstructive sleep apnea) 01/01/2007   Essential hypertension 11/23/2006     Past Surgical History:  Procedure Laterality Date   DILATION AND CURETTAGE OF UTERUS     ECTOPIC PREGNANCY SURGERY     LAPAROTOMY     for tubal pregnancy   TUBAL LIGATION       Current Outpatient Medications  Medication Instructions   amLODipine -olmesartan  (AZOR ) 10-40 MG tablet 1 tablet, Oral, Daily   atorvastatin  (LIPITOR) 10 mg, Oral, Daily   Blood Glucose Monitoring Suppl (ONETOUCH VERIO) w/Device KIT PLEASE CHECK BLOOD SUGAR 3 TIMES DAILY   Blood Glucose Monitoring Suppl (TRUE METRIX METER) w/Device KIT Use as directed   cyclobenzaprine  (FLEXERIL ) 10 mg, Oral, 2  times daily PRN   fluconazole  (DIFLUCAN ) 150 mg, Oral, Daily   glucose blood test strip Please check blood sugars 3 times daily   Lancets (ONETOUCH ULTRASOFT) lancets Please check blood sugars 3 times daily   melatonin 5 mg, Oral, Daily at bedtime   metFORMIN  (GLUCOPHAGE -XR) 1,000 mg, Oral, 2 times daily   sertraline  (ZOLOFT ) 25 mg, Oral, Daily   traZODone  (DESYREL ) 50 mg, Oral, At bedtime PRN   TRUEplus Lancets 30G MISC Use to check blood  sugar 3 (three) times daily.    Social History[1]    Objective  Today's Vitals   11/12/24 0902  BP: (!) 138/94  Pulse: 96  Temp: 98.8 F (37.1 C)  TempSrc: Oral  SpO2: 94%  Weight: 202 lb 6.4 oz (91.8 kg)  Height: 5' (1.524 m)  Body mass index is 39.53 kg/m.   Physical Exam:   Constitutional: well-appearing female sitting in exam chair, in no acute distress. Ambulates without use of assistance device  HEENT: normocephalic atraumatic, mucous membranes moist Eyes: conjunctiva non-erythematous Neck: supple Cardiovascular: regular rate and rhythm Pulmonary/Chest: normal work of breathing on room air, lungs clear to auscultation bilaterally Abdominal: soft, non-tender, non-distended MSK: normal bulk and tone. Neurological: alert & oriented x 3 Skin: warm and dry Psych: mood calm, behavior normal, thought content normal, judgement normal      The 10-year ASCVD risk score (Arnett DK, et al., 2019) is: 14.4%   Values used to calculate the score:     Age: 12 years     Clinically relevant sex: Female     Is Non-Hispanic African American: Yes     Diabetic: Yes     Tobacco smoker: No     Systolic Blood Pressure: 138 mmHg     Is BP treated: Yes     HDL Cholesterol: 61 mg/dL     Total Cholesterol: 165 mg/dL      Assessment & Plan  Problem List Items Addressed This Visit     Exposure to influenza - Primary   Patient presents to clinic with 2 days of symptoms concerning for influenza including fatigue, myalgias, fever, and cough.  She states that her grandson was with her over the weekend, and he developed the same symptoms on Saturday before he was taken to his pediatrician on Monday 12/22 and tested positive for influenza A.  She reports that she then began experiencing the exact same symptoms on Monday morning that he had as well.  I discussed with her that her symptoms and recent sick contact that were positive for influenza A.  We need to believe that she currently has the  flu as well.  I discussed with her that she is out of the 48-hour window for receiving Tamiflu, and she is also reported that her symptoms are improving now.  Through shared decision making we have deferred obtaining point-of-care rapid influenza testing.  I discussed with her conservative treatment measures and recommend that she stay hydrated during this time.  I have discussed with her that she is most contagious in the 3 to 4 days after her symptoms started, and recommend that she wear a mask when she is out in public, and wash her hands regularly.  We discussed recommendation of returning to her normal activities after she has remained fever free for 24 hours without concurrent use of Tylenol .  She is in agreement with this plan and voiced understanding of recommendations. - Rest and rehydration, OTC medications prn - Mask and hand hygiene  Return in about 4 weeks (around 12/10/2024).  Patient discussed with Dr. Rosan Doyal Miyamoto, MD [facility]  PGY-1 11/16/2024, 6:11 PM      [1]  Social History Tobacco Use   Smoking status: Former    Current packs/day: 0.00    Average packs/day: (0.9 ttl pk-yrs)    Types: Cigarettes    Start date: 05/20/1985    Quit date: 05/21/2015    Years since quitting: 9.4    Passive exposure: Never   Smokeless tobacco: Never  Vaping Use   Vaping status: Never Used  Substance Use Topics   Alcohol use: Yes    Comment: rarely.   Drug use: No   "

## 2024-11-12 NOTE — Patient Instructions (Signed)
 Thank you, Ms.Marelyn LITTIE Slade for allowing us  to provide your care today. Today we discussed the following:  - Please make sure you are exercising great hand hygiene and washing your hands with soap and water! - Please wear a mask when you are out in public or with others - You should remain fever-free without use of Tylenol  for at least 24 hours prior to resuming normal activities - Make sure you're well-hydrated!    Follow up: 4 weeks    Should you have any questions or concerns please call the Internal Medicine Clinic at [phone].     Doyal Miyamoto, MD [facility]

## 2024-11-16 DIAGNOSIS — Z20828 Contact with and (suspected) exposure to other viral communicable diseases: Secondary | ICD-10-CM | POA: Insufficient documentation

## 2024-11-16 NOTE — Assessment & Plan Note (Signed)
 Patient presents to clinic with 2 days of symptoms concerning for influenza including fatigue, myalgias, fever, and cough.  She states that her grandson was with her over the weekend, and he developed the same symptoms on Saturday before he was taken to his pediatrician on Monday 12/22 and tested positive for influenza A.  She reports that she then began experiencing the exact same symptoms on Monday morning that he had as well.  I discussed with her that her symptoms and recent sick contact that were positive for influenza A.  We need to believe that she currently has the flu as well.  I discussed with her that she is out of the 48-hour window for receiving Tamiflu, and she is also reported that her symptoms are improving now.  Through shared decision making we have deferred obtaining point-of-care rapid influenza testing.  I discussed with her conservative treatment measures and recommend that she stay hydrated during this time.  I have discussed with her that she is most contagious in the 3 to 4 days after her symptoms started, and recommend that she wear a mask when she is out in public, and wash her hands regularly.  We discussed recommendation of returning to her normal activities after she has remained fever free for 24 hours without concurrent use of Tylenol .  She is in agreement with this plan and voiced understanding of recommendations. - Rest and rehydration, OTC medications prn - Mask and hand hygiene

## 2024-11-24 ENCOUNTER — Other Ambulatory Visit: Payer: Self-pay | Admitting: Student

## 2024-11-24 ENCOUNTER — Other Ambulatory Visit: Payer: Self-pay

## 2024-11-24 DIAGNOSIS — E7849 Other hyperlipidemia: Secondary | ICD-10-CM

## 2024-11-24 DIAGNOSIS — F411 Generalized anxiety disorder: Secondary | ICD-10-CM

## 2024-11-24 DIAGNOSIS — I10 Essential (primary) hypertension: Secondary | ICD-10-CM

## 2024-11-25 ENCOUNTER — Other Ambulatory Visit: Payer: Self-pay

## 2024-11-25 MED ORDER — ATORVASTATIN CALCIUM 10 MG PO TABS
10.0000 mg | ORAL_TABLET | Freq: Every day | ORAL | 0 refills | Status: DC
  Filled 2024-11-25: qty 90, 90d supply, fill #0

## 2024-11-25 MED FILL — Trazodone HCl Tab 50 MG: ORAL | 30 days supply | Qty: 30 | Fill #0 | Status: AC

## 2024-11-25 MED FILL — Sertraline HCl Tab 25 MG: ORAL | 60 days supply | Qty: 60 | Fill #0 | Status: CN

## 2024-11-25 MED FILL — Amlodipine Besylate-Olmesartan Medoxomil Tab 10-40 MG: ORAL | 30 days supply | Qty: 30 | Fill #0 | Status: AC

## 2024-11-25 NOTE — Progress Notes (Signed)
 Internal Medicine Clinic Attending  Case discussed with the resident at the time of the visit.  We reviewed the resident's history and exam and pertinent patient test results.  I agree with the assessment, diagnosis, and plan of care documented in the resident's note.

## 2024-11-26 ENCOUNTER — Other Ambulatory Visit (HOSPITAL_COMMUNITY): Payer: Self-pay

## 2024-11-26 ENCOUNTER — Other Ambulatory Visit: Payer: Self-pay

## 2024-11-28 ENCOUNTER — Other Ambulatory Visit (HOSPITAL_COMMUNITY): Payer: Self-pay

## 2024-11-28 ENCOUNTER — Other Ambulatory Visit: Payer: Self-pay | Admitting: Student

## 2024-11-28 DIAGNOSIS — I10 Essential (primary) hypertension: Secondary | ICD-10-CM

## 2024-11-28 DIAGNOSIS — F411 Generalized anxiety disorder: Secondary | ICD-10-CM

## 2024-12-01 ENCOUNTER — Encounter: Payer: Self-pay | Admitting: *Deleted

## 2024-12-01 ENCOUNTER — Encounter (HOSPITAL_COMMUNITY): Payer: Self-pay

## 2024-12-01 ENCOUNTER — Other Ambulatory Visit (HOSPITAL_COMMUNITY): Payer: Self-pay

## 2024-12-01 NOTE — Telephone Encounter (Signed)
 Medication refill request has already been addressed. All medications refilled 11/25/24.

## 2024-12-02 ENCOUNTER — Encounter: Payer: Self-pay | Admitting: Internal Medicine

## 2024-12-08 ENCOUNTER — Telehealth (HOSPITAL_COMMUNITY): Payer: Self-pay

## 2024-12-08 ENCOUNTER — Ambulatory Visit: Payer: Self-pay | Admitting: Student

## 2024-12-08 ENCOUNTER — Other Ambulatory Visit (HOSPITAL_COMMUNITY): Payer: Self-pay

## 2024-12-08 ENCOUNTER — Encounter: Payer: Self-pay | Admitting: Student

## 2024-12-08 ENCOUNTER — Other Ambulatory Visit: Payer: Self-pay

## 2024-12-08 VITALS — BP 132/84 | HR 102 | Temp 97.9°F | Ht 61.0 in | Wt 208.0 lb

## 2024-12-08 DIAGNOSIS — Z7985 Long-term (current) use of injectable non-insulin antidiabetic drugs: Secondary | ICD-10-CM

## 2024-12-08 DIAGNOSIS — Z7984 Long term (current) use of oral hypoglycemic drugs: Secondary | ICD-10-CM

## 2024-12-08 DIAGNOSIS — Z0000 Encounter for general adult medical examination without abnormal findings: Secondary | ICD-10-CM

## 2024-12-08 DIAGNOSIS — E119 Type 2 diabetes mellitus without complications: Secondary | ICD-10-CM | POA: Diagnosis not present

## 2024-12-08 DIAGNOSIS — Z20828 Contact with and (suspected) exposure to other viral communicable diseases: Secondary | ICD-10-CM

## 2024-12-08 MED ORDER — MOUNJARO 2.5 MG/0.5ML ~~LOC~~ SOAJ
2.5000 mg | SUBCUTANEOUS | 0 refills | Status: AC
  Filled 2024-12-08 – 2024-12-09 (×2): qty 2, 28d supply, fill #0

## 2024-12-08 NOTE — Assessment & Plan Note (Signed)
 Patient presented for follow-up after being seen three weeks ago for flu-like symptoms, at which time conservative management was recommended. Reassuringly, her symptoms have largely resolved with hydration and use of NyQuil, with only mild residual chest congestion. Given the resolution of symptoms over this timeframe and the absence of new or worsening symptoms, including fever, this presentation is most consistent with a resolved viral infection. There is no indication for escalation of care or change in management at this time.

## 2024-12-08 NOTE — Assessment & Plan Note (Addendum)
 Lab Results  Component Value Date   HGBA1C 8.0 (A) 10/31/2024   HGBA1C 10.3 (H) 01/31/2024   HGBA1C 10.7 (A) 11/08/2023    Patient has a history of diabetes, currently managed with metformin  1000 mg BID. Her hemoglobin A1c has improved from 10.3% to 8%. BMI is 39.3. She reports prior use of Ozempic , which was discontinued due to metallic taste. Discussed additional GLP-1 receptor agonist therapy to support glycemic control and weight management. She is willing to try Mounjaro . Will submit for preauthorization. If she tolerates the 2.5 mg weekly dose, she will call back to discuss a potential dose increase. Plan: - Prescribe Mounjaro  2.5 mg weekly - Potential side effect of Mounjaro  currently discussed - Follow-up in 2 months for hemoglobin A1c - Deferred foot exam today - In office Retinal/fundus photography ordered

## 2024-12-08 NOTE — Patient Instructions (Addendum)
 Thank you, [name] for allowing us  to provide your care today. Today we discussed your last office visit and DM.I am glad your flu symptoms has resolved .   Lab Results  Component Value Date   HGBA1C 8.0 (A) 10/31/2024   HGBA1C 10.3 (H) 01/31/2024   HGBA1C 10.7 (A) 11/08/2023  We will recheck your A1c at your next visit. Since you did not tolerate Ozempic , we will try Mounjaro  and see how you do with it. After 4 weeks, please call us  to let us  know how you are tolerating the medication, and we can discuss increasing the dose at that time.  I have ordered the following labs for you:  Lab Orders  No laboratory test(s) ordered today     Tests ordered today:    Referrals ordered today:   Referral Orders  No referral(s) requested today     I have ordered the following medication/changed the following medications:   Stop the following medications: There are no discontinued medications.   Start the following medications: Meds ordered this encounter  Medications   tirzepatide  (MOUNJARO ) 2.5 MG/0.5ML Pen    Sig: Inject 2.5 mg into the skin once a week.    Dispense:  2 mL    Refill:  0     Follow up: 2 months   Remember:   Should you have any questions or concerns please call the internal medicine clinic at [phone].   Drue Lisa Grow MD 12/08/2024, 3:49 PM   [facility]

## 2024-12-08 NOTE — Assessment & Plan Note (Signed)
 Care gaps: - She has colonoscopy scheduled for 01/11/2025 - Declined PAP smear today. [facility]

## 2024-12-08 NOTE — Progress Notes (Signed)
 "  CC: 3- week follow up on viral illness    HPI:  Crystal English is a 59 y.o. female living with a history stated below and presents today for 3-week follow-up. Please see problem based assessment and plan for additional details.  Past Medical History:  Diagnosis Date   Agatston coronary artery calcium  score less than 100    6.3 coronary Ca score 11/2021 CT   Allergic rhinitis    Anxiety    Asthma    Required inhaled corticosteroid in past.    Benign paroxysmal positional vertigo, unspecified laterality    Body mass index (BMI) 40.0-44.9, adult (HCC)    Cervical radiculopathy    Daily headache    Depression, major 02/25/2013   First episode that required was 2014. Prior episodes that did not require pt to seek medical tx.   DM (diabetes mellitus) (HCC)    Fibromyalgia    Hyperlipidemia    Hypertension    2 drug therapy   Left knee pain 01/15/2020   Low back pain 08/10/2016   Morbid obesity due to excess calories (HCC)    Nausea 09/19/2018   OSA (obstructive sleep apnea)    Sleep study 12/2006 : Moderately Severe OSA. AHI 38.8. O2 sat decreased to 71%.   Primary insomnia    Severe obesity (BMI >= 40) (HCC) 10/08/2012   Obesity Class 3. BMI > 40.    Strain of flexor muscle of right hip 09/19/2018   Tobacco abuse    Urge incontinence     Medications Ordered Prior to Encounter[1]  Family History  Problem Relation Age of Onset   Diabetes Mother    Hypertension Mother    Arthritis Mother    Hyperlipidemia Mother    Hypertension Father    Stroke Father        in his 75's   Heart disease Father        died <47 y.o   Heart disease Sister    Heart disease Brother    Heart disease Maternal Grandmother    Stomach cancer Maternal Grandmother 81   Diabetes Maternal Aunt    Breast cancer Maternal Aunt 60   Asthma Maternal Aunt    Prostate cancer Maternal Uncle     Social History   Socioeconomic History   Marital status: Single    Spouse name: Not on file    Number of children: 3   Years of education: some coll.   Highest education level: Not on file  Occupational History   Occupation: Claims processing    Employer: [employer]  Tobacco Use   Smoking status: Former    Current packs/day: 0.00    Average packs/day: (0.9 ttl pk-yrs)    Types: Cigarettes    Start date: 05/20/1985    Quit date: 05/21/2015    Years since quitting: 9.5    Passive exposure: Never   Smokeless tobacco: Never  Vaping Use   Vaping status: Never Used  Substance and Sexual Activity   Alcohol use: Yes    Comment: rarely.   Drug use: No   Sexual activity: Not Currently    Partners: Male    Birth control/protection: Surgical    Comment: tubal  Other Topics Concern   Not on file  Social History Narrative   Lives in [place] alone. 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   Social Drivers of Health   Tobacco Use: Medium Risk (12/08/2024)   Patient History    Smoking  Tobacco Use: Former    Smokeless Tobacco Use: Never    Passive Exposure: Never  Programmer, Applications: Not on file  Food Insecurity: No Food Insecurity (07/31/2023)   Hunger Vital Sign    Worried About Running Out of Food in the Last Year: Never true    Ran Out of Food in the Last Year: Never true  Transportation Needs: No Transportation Needs (07/31/2023)   PRAPARE - Administrator, Civil Service (Medical): No    Lack of Transportation (Non-Medical): No  Physical Activity: Not on file  Stress: Not on file  Social Connections: Not on file  Intimate Partner Violence: Not At Risk (07/31/2023)   Humiliation, Afraid, Rape, and Kick questionnaire    Fear of Current or Ex-Partner: No    Emotionally Abused: No    Physically Abused: No    Sexually Abused: No  Depression (PHQ2-9): High Risk (12/08/2024)   Depression (PHQ2-9)    PHQ-2 Score: 22  Alcohol Screen: Not on file  Housing: Low Risk (07/31/2023)   Housing    Last Housing Risk Score: 0  Utilities: Not on file   Health Literacy: Not on file    Review of Systems: ROS negative except for what is noted on the assessment and plan.  Vitals:   12/08/24 1441  BP: 132/84  Pulse: (!) 102  Temp: 97.9 F (36.6 C)  TempSrc: Oral  SpO2: 97%  Weight: 208 lb (94.3 kg)  Height: 5' 1 (1.549 m)    Physical Exam: Constitutional: well-appearing woman , sitting in chair , in no acute distress Cardiovascular: regular rate and rhythm, no m/r/g Pulmonary/Chest: normal work of breathing on room air, lungs clear to auscultation bilaterally Neurological: alert & oriented x 3, no focal deficit Skin: warm and dry Psych: normal mood and behavior  Assessment & Plan:   Healthcare maintenance Care gaps: - She has colonoscopy scheduled for 01/11/2025 - Declined PAP smear today. [facility]   Exposure to influenza - FOLLOW UP Patient presented for follow-up after being seen three weeks ago for flu-like symptoms, at which time conservative management was recommended. Reassuringly, her symptoms have largely resolved with hydration and use of NyQuil, with only mild residual chest congestion. Given the resolution of symptoms over this timeframe and the absence of new or worsening symptoms, including fever, this presentation is most consistent with a resolved viral infection. There is no indication for escalation of care or change in management at this time.  Diabetes mellitus (HCC) Lab Results  Component Value Date   HGBA1C 8.0 (A) 10/31/2024   HGBA1C 10.3 (H) 01/31/2024   HGBA1C 10.7 (A) 11/08/2023    Patient has a history of diabetes, currently managed with metformin  1000 mg BID. Her hemoglobin A1c has improved from 10.3% to 8%. BMI is 39.3. She reports prior use of Ozempic , which was discontinued due to metallic taste. Discussed additional GLP-1 receptor agonist therapy to support glycemic control and weight management. She is willing to try Mounjaro . Will submit for  preauthorization. If she tolerates the 2.5 mg weekly dose, she will call back to discuss a potential dose increase. Plan: - Prescribe Mounjaro  2.5 mg weekly - Potential side effect of Mounjaro  currently discussed - Follow-up in 2 months for hemoglobin A1c - Deferred foot exam today - In office Retinal/fundus photography ordered   Return in 2 months for Diabetes follow up and discuss other chronic conditions.  Patient discussed with Dr. Lovie  Drue Grow, M.D [facility] Phone: [phone] Date 12/08/2024 Time 6:28 PM      [1]  Current Outpatient Medications on File Prior to Visit  Medication Sig Dispense Refill   amLODipine -olmesartan  (AZOR ) 10-40 MG tablet Take 1 tablet by mouth daily. 30 tablet 0   atorvastatin  (LIPITOR) 10 MG tablet Take 1 tablet (10 mg total) by mouth daily. 90 tablet 0   Blood Glucose Monitoring Suppl (ONETOUCH VERIO) w/Device KIT PLEASE CHECK BLOOD SUGAR 3 TIMES DAILY 1 kit 0   Blood Glucose Monitoring Suppl (TRUE METRIX METER) w/Device KIT Use as directed 1 kit 0   cyclobenzaprine  (FLEXERIL ) 10 MG tablet Take 1 tablet (10 mg total) by mouth 2 (two) times daily as needed for muscle spasms. 20 tablet 0   fluconazole  (DIFLUCAN ) 150 MG tablet Take 1 tablet (150 mg total) by mouth daily. 2 tablet 0   glucose blood test strip Please check blood sugars 3 times daily 100 each 1   Lancets (ONETOUCH ULTRASOFT) lancets Please check blood sugars 3 times daily 100 each 12   melatonin 5 MG TABS Take 1 tablet (5 mg total) by mouth at bedtime. 30 tablet 0   metFORMIN  (GLUCOPHAGE -XR) 500 MG 24 hr tablet Take 2 tablets (1,000 mg total) by mouth 2 (two) times daily. 360 tablet 1   sertraline  (ZOLOFT ) 25 MG tablet Take 1 tablet (25 mg total) by mouth daily. 60 tablet 0   traZODone  (DESYREL ) 50 MG tablet Take 1 tablet (50 mg total) by mouth at bedtime as needed for sleep. 30 tablet 0   TRUEplus Lancets 30G MISC Use to check blood sugar 3 (three) times  daily. 100 each 0   No current facility-administered medications on file prior to visit.   "

## 2024-12-09 ENCOUNTER — Telehealth (HOSPITAL_COMMUNITY): Payer: Self-pay

## 2024-12-09 ENCOUNTER — Other Ambulatory Visit (HOSPITAL_COMMUNITY): Payer: Self-pay

## 2024-12-09 NOTE — Telephone Encounter (Signed)
 PA request has been Received. New Encounter has been or will be created for follow up. For additional info see Pharmacy Prior Auth telephone encounter from 12/09/24.

## 2024-12-09 NOTE — Telephone Encounter (Signed)
 Pharmacy Patient Advocate Encounter  Received notification from [employer] that Prior Authorization for Mounjaro  2.5MG /0.5ML auto-injectors  has been APPROVED from 12/09/24 to 12/09/25. Ran test claim, Copay is $0. This test claim was processed through [facility]- copay amounts may vary at other pharmacies due to pharmacy/plan contracts, or as the patient moves through the different stages of their insurance plan.   PA #/Case ID/Reference #: [id number]

## 2024-12-09 NOTE — Progress Notes (Signed)
 Internal Medicine Clinic Attending  Case discussed with the resident at the time of the visit.  We reviewed the resident's history and exam and pertinent patient test results.  I agree with the assessment, diagnosis, and plan of care documented in the resident's note.

## 2024-12-09 NOTE — Telephone Encounter (Signed)
 Pharmacy Patient Advocate Encounter   Received notification from Pt Calls Messages that prior authorization for Mounjaro  2.5MG /0.5ML auto-injectors  is required/requested.   Insurance verification completed.   The patient is insured through [employer].   Per test claim: PA required; PA submitted to above mentioned insurance via Latent Key/confirmation #/EOC [id number] Status is pending

## 2024-12-11 ENCOUNTER — Ambulatory Visit

## 2024-12-11 ENCOUNTER — Other Ambulatory Visit (HOSPITAL_COMMUNITY): Payer: Self-pay

## 2024-12-11 ENCOUNTER — Other Ambulatory Visit: Payer: Self-pay

## 2024-12-11 VITALS — Ht 61.0 in | Wt 208.0 lb

## 2024-12-11 DIAGNOSIS — Z1211 Encounter for screening for malignant neoplasm of colon: Secondary | ICD-10-CM

## 2024-12-11 MED ORDER — NA SULFATE-K SULFATE-MG SULF 17.5-3.13-1.6 GM/177ML PO SOLN
1.0000 | Freq: Once | ORAL | 0 refills | Status: AC
  Filled 2024-12-11: qty 354, 2d supply, fill #0

## 2024-12-11 NOTE — Progress Notes (Signed)
 Denies allergies to eggs or soy products. Denies complication of anesthesia or sedation. Denies use of weight loss medication. Denies use of O2.   Emmi instructions given for colonoscopy.

## 2024-12-16 ENCOUNTER — Other Ambulatory Visit: Payer: Self-pay | Admitting: *Deleted

## 2024-12-16 ENCOUNTER — Telehealth: Payer: Self-pay

## 2024-12-16 ENCOUNTER — Encounter: Payer: Self-pay | Admitting: Internal Medicine

## 2024-12-16 DIAGNOSIS — I10 Essential (primary) hypertension: Secondary | ICD-10-CM

## 2024-12-16 DIAGNOSIS — F411 Generalized anxiety disorder: Secondary | ICD-10-CM

## 2024-12-16 DIAGNOSIS — E7849 Other hyperlipidemia: Secondary | ICD-10-CM

## 2024-12-16 MED ORDER — ATORVASTATIN CALCIUM 10 MG PO TABS: 10.0000 mg | ORAL_TABLET | Freq: Every day | ORAL | 0 refills | Status: AC

## 2024-12-16 MED ORDER — SERTRALINE HCL 25 MG PO TABS: 25.0000 mg | ORAL_TABLET | Freq: Every day | ORAL | 0 refills | Status: AC

## 2024-12-16 MED ORDER — AMLODIPINE-OLMESARTAN 10-40 MG PO TABS: 1.0000 | ORAL_TABLET | Freq: Every day | ORAL | 1 refills | Status: AC

## 2024-12-16 MED ORDER — TRAZODONE HCL 50 MG PO TABS: 50.0000 mg | ORAL_TABLET | Freq: Every evening | ORAL | 1 refills | Status: AC | PRN

## 2024-12-16 NOTE — Telephone Encounter (Signed)
"   Called pt to  schedule  an appt and ask a few questions for COPD  .SABRA If  patient  calls back please direct call to me ann  thanks in advance  "

## 2024-12-16 NOTE — Addendum Note (Signed)
 Addended by: FREDDI THEOPLIS BROCKS on: 12/16/2024 01:46 PM   Modules accepted: Orders

## 2024-12-19 ENCOUNTER — Other Ambulatory Visit (HOSPITAL_COMMUNITY): Payer: Self-pay

## 2024-12-25 ENCOUNTER — Telehealth: Payer: Self-pay | Admitting: Internal Medicine

## 2024-12-25 NOTE — Telephone Encounter (Signed)
 Called patient back and she said she had meant to call her PCP. Her PCP instructed her to take ibuprofen  for her pain. I advised her to not take ibuprofen  but to take tylenol  instead. She understood and had no further questions.

## 2024-12-25 NOTE — Telephone Encounter (Signed)
 Patient called stating she is experiencing a lot of pain from fibromyalgia. Requesting a call back with medication she's able to take before tomorrow's procedure. Please advise, Thank you.

## 2024-12-26 ENCOUNTER — Telehealth: Payer: Self-pay | Admitting: *Deleted

## 2024-12-26 ENCOUNTER — Encounter: Payer: Self-pay | Admitting: Internal Medicine

## 2024-12-26 ENCOUNTER — Ambulatory Visit: Admitting: Internal Medicine

## 2024-12-26 VITALS — BP 149/95 | HR 83 | Temp 98.2°F | Resp 12 | Ht 61.0 in | Wt 208.0 lb

## 2024-12-26 DIAGNOSIS — D122 Benign neoplasm of ascending colon: Secondary | ICD-10-CM

## 2024-12-26 DIAGNOSIS — D123 Benign neoplasm of transverse colon: Secondary | ICD-10-CM

## 2024-12-26 DIAGNOSIS — Z1211 Encounter for screening for malignant neoplasm of colon: Secondary | ICD-10-CM

## 2024-12-26 NOTE — Progress Notes (Signed)
 Pt's states no medical or surgical changes since previsit or office visit.

## 2024-12-26 NOTE — Progress Notes (Signed)
 Called to room to assist during endoscopic procedure.  Patient ID and intended procedure confirmed with present staff. Received instructions for my participation in the procedure from the performing physician.

## 2024-12-26 NOTE — Progress Notes (Signed)
 "   GASTROENTEROLOGY PROCEDURE H&P NOTE   Primary Care Physician: Marylu Gee, DO    Reason for Procedure:   Colon cancer screening  Plan:    Colonoscopy  Patient is appropriate for endoscopic procedure(s) in the ambulatory ([facility]) setting.  The nature of the procedure, as well as the risks, benefits, and alternatives were carefully and thoroughly reviewed with the patient. Ample time for discussion and questions allowed. The patient understood, was satisfied, and agreed to proceed.     HPI: Crystal English is a 59 y.o. female who presents for colonoscopy for colon cancer screening. Denies blood in stools, changes in bowel habits, or unintentional weight loss. Denies family history of colon cancer.   Past Medical History:  Diagnosis Date   Agatston coronary artery calcium  score less than 100    6.3 coronary Ca score 11/2021 CT   Allergic rhinitis    Allergy     Anxiety    Arthritis    Asthma    Required inhaled corticosteroid in past.    Benign paroxysmal positional vertigo, unspecified laterality    Body mass index (BMI) 40.0-44.9, adult (HCC)    Cervical radiculopathy    Daily headache    Depression, major 02/25/2013   First episode that required was 2014. Prior episodes that did not require pt to seek medical tx.   DM (diabetes mellitus) (HCC)    Fibromyalgia    GERD (gastroesophageal reflux disease)    Hyperlipidemia    Hypertension    2 drug therapy   Left knee pain 01/15/2020   Low back pain 08/10/2016   Morbid obesity due to excess calories (HCC)    Nausea 09/19/2018   OSA (obstructive sleep apnea)    Sleep study 12/2006 : Moderately Severe OSA. AHI 38.8. O2 sat decreased to 71%.   Primary insomnia    Severe obesity (BMI >= 40) (HCC) 10/08/2012   Obesity Class 3. BMI > 40.    Sleep apnea    Strain of flexor muscle of right hip 09/19/2018   Substance abuse (HCC)    Tobacco abuse    Urge incontinence     Past Surgical History:  Procedure Laterality  Date   DILATION AND CURETTAGE OF UTERUS     ECTOPIC PREGNANCY SURGERY     LAPAROTOMY     for tubal pregnancy   TUBAL LIGATION      Prior to Admission medications  Medication Sig Start Date End Date Taking? Authorizing Provider  amLODipine -olmesartan  (AZOR ) 10-40 MG tablet Take 1 tablet by mouth daily. 12/16/24  Yes Marylu Gee, DO  atorvastatin  (LIPITOR) 10 MG tablet Take 1 tablet (10 mg total) by mouth daily. 12/16/24  Yes Marylu Gee, DO  metFORMIN  (GLUCOPHAGE -XR) 500 MG 24 hr tablet Take 2 tablets (1,000 mg total) by mouth 2 (two) times daily. 07/08/24  Yes Marylu Gee, DO  sertraline  (ZOLOFT ) 25 MG tablet Take 1 tablet (25 mg total) by mouth daily. 12/16/24 03/16/25 Yes Marylu Gee, DO  traZODone  (DESYREL ) 50 MG tablet Take 1 tablet (50 mg total) by mouth at bedtime as needed for sleep. 12/16/24  Yes Marylu Gee, DO  Blood Glucose Monitoring Suppl (ONETOUCH VERIO) w/Device KIT PLEASE CHECK BLOOD SUGAR 3 TIMES DAILY Patient not taking: Reported on 12/26/2024 09/04/18   Kerman Soulier, MD  Blood Glucose Monitoring Suppl (TRUE METRIX METER) w/Device KIT Use as directed Patient not taking: Reported on 12/26/2024 09/14/22     cyclobenzaprine  (FLEXERIL ) 10 MG tablet Take 1 tablet (10 mg total) by  mouth 2 (two) times daily as needed for muscle spasms. Patient not taking: Reported on 12/11/2024 07/08/24   Rising, Asberry, PA-C  fluconazole  (DIFLUCAN ) 150 MG tablet Take 1 tablet (150 mg total) by mouth daily. Patient not taking: Reported on 12/11/2024 01/08/24   Marylu Gee, DO  glucose blood test strip Please check blood sugars 3 times daily Patient not taking: Reported on 12/26/2024 08/18/22   Mapp, Tavien, MD  Lancets [facility] ULTRASOFT) lancets Please check blood sugars 3 times daily Patient not taking: Reported on 12/26/2024 12/31/18   Kerman Soulier, MD  melatonin 5 MG TABS Take 1 tablet (5 mg total) by mouth at bedtime. Patient not taking: Reported on 12/26/2024 03/27/24   Marylu Gee, DO   tirzepatide  (MOUNJARO ) 2.5 MG/0.5ML Pen Inject 2.5 mg into the skin once a week. 12/08/24   Renne Homans, MD  TRUEplus Lancets 30G MISC Use to check blood sugar 3 (three) times daily. Patient not taking: Reported on 12/26/2024 09/14/22       Current Outpatient Medications  Medication Sig Dispense Refill   amLODipine -olmesartan  (AZOR ) 10-40 MG tablet Take 1 tablet by mouth daily. 90 tablet 1   atorvastatin  (LIPITOR) 10 MG tablet Take 1 tablet (10 mg total) by mouth daily. 90 tablet 0   metFORMIN  (GLUCOPHAGE -XR) 500 MG 24 hr tablet Take 2 tablets (1,000 mg total) by mouth 2 (two) times daily. 360 tablet 1   sertraline  (ZOLOFT ) 25 MG tablet Take 1 tablet (25 mg total) by mouth daily. 90 tablet 0   traZODone  (DESYREL ) 50 MG tablet Take 1 tablet (50 mg total) by mouth at bedtime as needed for sleep. 90 tablet 1   Blood Glucose Monitoring Suppl (ONETOUCH VERIO) w/Device KIT PLEASE CHECK BLOOD SUGAR 3 TIMES DAILY (Patient not taking: Reported on 12/26/2024) 1 kit 0   Blood Glucose Monitoring Suppl (TRUE METRIX METER) w/Device KIT Use as directed (Patient not taking: Reported on 12/26/2024) 1 kit 0   cyclobenzaprine  (FLEXERIL ) 10 MG tablet Take 1 tablet (10 mg total) by mouth 2 (two) times daily as needed for muscle spasms. (Patient not taking: Reported on 12/11/2024) 20 tablet 0   fluconazole  (DIFLUCAN ) 150 MG tablet Take 1 tablet (150 mg total) by mouth daily. (Patient not taking: Reported on 12/11/2024) 2 tablet 0   glucose blood test strip Please check blood sugars 3 times daily (Patient not taking: Reported on 12/26/2024) 100 each 1   Lancets (ONETOUCH ULTRASOFT) lancets Please check blood sugars 3 times daily (Patient not taking: Reported on 12/26/2024) 100 each 12   melatonin 5 MG TABS Take 1 tablet (5 mg total) by mouth at bedtime. (Patient not taking: Reported on 12/26/2024) 30 tablet 0   tirzepatide  (MOUNJARO ) 2.5 MG/0.5ML Pen Inject 2.5 mg into the skin once a week. 2 mL 0   TRUEplus Lancets 30G MISC Use  to check blood sugar 3 (three) times daily. (Patient not taking: Reported on 12/26/2024) 100 each 0   No current facility-administered medications for this visit.    Allergies as of 12/26/2024   (No Known Allergies)    Family History  Problem Relation Age of Onset   Diabetes Mother    Hypertension Mother    Arthritis Mother    Hyperlipidemia Mother    Hypertension Father    Stroke Father        in his 52's   Heart disease Father        died <47 y.o   Heart disease Sister    Heart disease Brother  Diabetes Maternal Aunt    Breast cancer Maternal Aunt 60   Asthma Maternal Aunt    Prostate cancer Maternal Uncle    Heart disease Maternal Grandmother    Stomach cancer Maternal Grandmother 79   Colon cancer Neg Hx    Esophageal cancer Neg Hx    Rectal cancer Neg Hx     Social History   Socioeconomic History   Marital status: Single    Spouse name: Not on file   Number of children: 3   Years of education: some coll.   Highest education level: Not on file  Occupational History   Occupation: Claims processing    Employer: [employer]  Tobacco Use   Smoking status: Former    Current packs/day: 0.00    Average packs/day: (0.9 ttl pk-yrs)    Types: Cigarettes    Start date: 05/20/1985    Quit date: 05/21/2015    Years since quitting: 9.6    Passive exposure: Never   Smokeless tobacco: Never  Vaping Use   Vaping status: Never Used  Substance and Sexual Activity   Alcohol use: Not Currently    Comment: rarely.   Drug use: No   Sexual activity: Not Currently    Partners: Male    Birth control/protection: Surgical    Comment: tubal  Other Topics Concern   Not on file  Social History Narrative   Lives in [place] alone. 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   Social Drivers of Health   Tobacco Use: Medium Risk (12/26/2024)   Patient History    Smoking Tobacco Use: Former    Smokeless Tobacco Use: Never    Passive Exposure: Never  Estate Agent: Not on file  Food Insecurity: No Food Insecurity (07/31/2023)   Hunger Vital Sign    Worried About Running Out of Food in the Last Year: Never true    Ran Out of Food in the Last Year: Never true  Transportation Needs: No Transportation Needs (07/31/2023)   PRAPARE - Administrator, Civil Service (Medical): No    Lack of Transportation (Non-Medical): No  Physical Activity: Not on file  Stress: Not on file  Social Connections: Not on file  Intimate Partner Violence: Not At Risk (07/31/2023)   Humiliation, Afraid, Rape, and Kick questionnaire    Fear of Current or Ex-Partner: No    Emotionally Abused: No    Physically Abused: No    Sexually Abused: No  Depression (PHQ2-9): High Risk (12/08/2024)   Depression (PHQ2-9)    PHQ-2 Score: 22  Alcohol Screen: Not on file  Housing: Low Risk (07/31/2023)   Housing    Last Housing Risk Score: 0  Utilities: Not on file  Health Literacy: Not on file    Physical Exam: Vital signs in last 24 hours: BP 121/77   Pulse 86   Temp 98.2 F (36.8 C) (Skin)   Ht 5' 1 (1.549 m)   Wt 208 lb (94.3 kg)   LMP 03/04/2014   SpO2 96%   BMI 39.30 kg/m  GEN: NAD EYE: Sclerae anicteric ENT: MMM CV: Non-tachycardic Pulm: No increased work of breathing GI: Soft, NT/ND NEURO:  Alert & Oriented   Estefana Kidney, MD [facility]  12/26/2024 9:03 AM  "

## 2024-12-26 NOTE — Progress Notes (Signed)
 Vss nad trans to pacu

## 2024-12-26 NOTE — Telephone Encounter (Signed)
 Answered an incoming call from the patient. She reports that she woke up from a nap a few minutes ago and thought that she had gas  and went to the restroom. She reports that she had some blood on the toilet tissue and a good amount of blood  in the toilet bowl. She is not having any abdominal pain or swelling. Instructed her to keep an eye out for further bleeding. IF it continues for her to call the on call physician. Will notify Dr. Federico.

## 2024-12-26 NOTE — Op Note (Signed)
 [facility] Patient Name: Crystal English Procedure Date: 12/26/2024 9:00 AM MRN: [id number] Endoscopist: Rosario Estefana Kidney , , [id number] Age: 59 Referring MD:  Date of Birth: [birth year] Gender: Female Account #: [id number] Procedure:                Colonoscopy Indications:              Screening for colorectal malignant neoplasm, This                            is the patient's first colonoscopy Medicines:                Monitored Anesthesia Care Procedure:                Pre-Anesthesia Assessment:                           - Prior to the procedure, a History and Physical                            was performed, and patient medications and                            allergies were reviewed. The patient's tolerance of                            previous anesthesia was also reviewed. The risks                            and benefits of the procedure and the sedation                            options and risks were discussed with the patient.                            All questions were answered, and informed consent                            was obtained. Prior Anticoagulants: The patient has                            taken no anticoagulant or antiplatelet agents. ASA                            Grade Assessment: II - A patient with mild systemic                            disease. After reviewing the risks and benefits,                            the patient was deemed in satisfactory condition to                            undergo the procedure.  After obtaining informed consent, the colonoscope                            was passed under direct vision. Throughout the                            procedure, the patient's blood pressure, pulse, and                            oxygen saturations were monitored continuously. The                            Olympus Scope [id number] was introduced through the                            anus and  advanced to the the terminal ileum. The                            colonoscopy was performed without difficulty. The                            patient tolerated the procedure well. The quality                            of the bowel preparation was excellent. The                            terminal ileum, ileocecal valve, appendiceal                            orifice, and rectum were photographed. Scope In: 9:16:32 AM Scope Out: 9:31:46 AM Scope Withdrawal Time: 0 hours 11 minutes 10 seconds  Total Procedure Duration: 0 hours 15 minutes 14 seconds  Findings:                 The terminal ileum appeared normal.                           Two sessile polyps were found in the transverse                            colon and ascending colon. The polyps were 3 to 7                            mm in size. These polyps were removed with a cold                            snare. Resection and retrieval were complete.                           Multiple diverticula were found in the sigmoid                            colon, descending colon and ascending colon.  Non-bleeding internal hemorrhoids were found during                            retroflexion. Complications:            No immediate complications. Estimated Blood Loss:     Estimated blood loss was minimal. Impression:               - The examined portion of the ileum was normal.                           - Two 3 to 7 mm polyps in the transverse colon and                            in the ascending colon, removed with a cold snare.                            Resected and retrieved.                           - Diverticulosis in the sigmoid colon, in the                            descending colon and in the ascending colon.                           - Non-bleeding internal hemorrhoids. Recommendation:           - Discharge patient to home (with escort).                           - Await pathology results.                            - The findings and recommendations were discussed                            with the patient. Dr Estefana Federico Rosario Estefana Federico,  12/26/2024 9:35:09 AM

## 2024-12-26 NOTE — Patient Instructions (Signed)
Resume previous diet. Await pathology results.   YOU HAD AN ENDOSCOPIC PROCEDURE TODAY AT THE [facility]:   Refer to the procedure report that was given to you for any specific questions about what was found during the examination.  If the procedure report does not answer your questions, please call your gastroenterologist to clarify.  If you requested that your care partner not be given the details of your procedure findings, then the procedure report has been included in a sealed envelope for you to review at your convenience later.  YOU SHOULD EXPECT: Some feelings of bloating in the abdomen. Passage of more gas than usual.  Walking can help get rid of the air that was put into your GI tract during the procedure and reduce the bloating. If you had a lower endoscopy (such as a colonoscopy or flexible sigmoidoscopy) you may notice spotting of blood in your stool or on the toilet paper. If you underwent a bowel prep for your procedure, you may not have a normal bowel movement for a few days.  Please Note:  You might notice some irritation and congestion in your nose or some drainage.  This is from the oxygen used during your procedure.  There is no need for concern and it should clear up in a day or so.  SYMPTOMS TO REPORT IMMEDIATELY:  Following lower endoscopy (colonoscopy or flexible sigmoidoscopy):  Excessive amounts of blood in the stool  Significant tenderness or worsening of abdominal pains  Swelling of the abdomen that is new, acute  Fever of 100F or higher  For urgent or emergent issues, a gastroenterologist can be reached at any hour by calling [phone]. Do not use MyChart messaging for urgent concerns.    DIET:  We do recommend a small meal at first, but then you may proceed to your regular diet.  Drink plenty of fluids but you should avoid alcoholic beverages for 24 hours.  ACTIVITY:  You should plan to take it easy for the rest of today and you should NOT  DRIVE or use heavy machinery until tomorrow (because of the sedation medicines used during the test).    FOLLOW UP: Our staff will call the number listed on your records the next business day following your procedure.  We will call around 7:15- 8:00 am to check on you and address any questions or concerns that you may have regarding the information given to you following your procedure. If we do not reach you, we will leave a message.     If any biopsies were taken you will be contacted by phone or by letter within the next 1-3 weeks.  Please call us at [phone] if you have not heard about the biopsies in 3 weeks.    SIGNATURES/CONFIDENTIALITY: You and/or your care partner have signed paperwork which will be entered into your electronic medical record.  These signatures attest to the fact that that the information above on your After Visit Summary has been reviewed and is understood.  Full responsibility of the confidentiality of this discharge information lies with you and/or your care-partner.

## 2025-01-06 ENCOUNTER — Institutional Professional Consult (permissible substitution): Payer: Self-pay | Admitting: Licensed Clinical Social Worker
